# Patient Record
Sex: Male | Born: 1955 | Race: White | Hispanic: No | Marital: Single | Smoking: Current every day smoker
Health system: Southern US, Community
[De-identification: ages and names within clinical notes are randomized; demographics above are authoritative.]

## PROBLEM LIST (undated history)

## (undated) DIAGNOSIS — M40204 Unspecified kyphosis, thoracic region: Secondary | ICD-10-CM

## (undated) DIAGNOSIS — IMO0002 Reserved for concepts with insufficient information to code with codable children: Secondary | ICD-10-CM

## (undated) DIAGNOSIS — F419 Anxiety disorder, unspecified: Secondary | ICD-10-CM

## (undated) DIAGNOSIS — R7302 Impaired glucose tolerance (oral): Secondary | ICD-10-CM

## (undated) DIAGNOSIS — Z720 Tobacco use: Secondary | ICD-10-CM

## (undated) DIAGNOSIS — M549 Dorsalgia, unspecified: Secondary | ICD-10-CM

## (undated) DIAGNOSIS — E785 Hyperlipidemia, unspecified: Secondary | ICD-10-CM

## (undated) DIAGNOSIS — I739 Peripheral vascular disease, unspecified: Secondary | ICD-10-CM

## (undated) DIAGNOSIS — I10 Essential (primary) hypertension: Secondary | ICD-10-CM

## (undated) DIAGNOSIS — C4402 Squamous cell carcinoma of skin of lip: Secondary | ICD-10-CM

## (undated) DIAGNOSIS — F1011 Alcohol abuse, in remission: Secondary | ICD-10-CM

## (undated) DIAGNOSIS — K219 Gastro-esophageal reflux disease without esophagitis: Secondary | ICD-10-CM

## (undated) DIAGNOSIS — M79606 Pain in leg, unspecified: Secondary | ICD-10-CM

## (undated) HISTORY — DX: Anxiety disorder, unspecified: F41.9

## (undated) HISTORY — DX: Unspecified kyphosis, thoracic region: M40.204

## (undated) HISTORY — PX: CAROTID ENDARTERECTOMY: SUR193

## (undated) HISTORY — DX: Essential (primary) hypertension: I10

## (undated) HISTORY — DX: Reserved for concepts with insufficient information to code with codable children: IMO0002

## (undated) HISTORY — DX: Tobacco use: Z72.0

## (undated) HISTORY — DX: Dorsalgia, unspecified: M54.9

## (undated) HISTORY — DX: Peripheral vascular disease, unspecified: I73.9

## (undated) HISTORY — DX: Pain in leg, unspecified: M79.606

## (undated) HISTORY — DX: Hyperlipidemia, unspecified: E78.5

## (undated) HISTORY — DX: Alcohol abuse, in remission: F10.11

## (undated) HISTORY — DX: Impaired glucose tolerance (oral): R73.02

## (undated) HISTORY — DX: Squamous cell carcinoma of skin of lip: C44.02

---

## 1983-08-09 HISTORY — PX: PENILE PROSTHESIS IMPLANT: SHX240

## 2001-02-14 ENCOUNTER — Encounter: Admission: RE | Admit: 2001-02-14 | Discharge: 2001-02-14 | Payer: Self-pay | Admitting: Internal Medicine

## 2001-02-15 ENCOUNTER — Encounter: Payer: Self-pay | Admitting: Internal Medicine

## 2001-02-15 ENCOUNTER — Ambulatory Visit (HOSPITAL_COMMUNITY): Admission: RE | Admit: 2001-02-15 | Discharge: 2001-02-15 | Payer: Self-pay | Admitting: Internal Medicine

## 2001-02-19 ENCOUNTER — Encounter: Admission: RE | Admit: 2001-02-19 | Discharge: 2001-02-19 | Payer: Self-pay | Admitting: Internal Medicine

## 2001-03-21 ENCOUNTER — Encounter: Admission: RE | Admit: 2001-03-21 | Discharge: 2001-03-21 | Payer: Self-pay | Admitting: Internal Medicine

## 2001-03-28 ENCOUNTER — Encounter: Admission: RE | Admit: 2001-03-28 | Discharge: 2001-03-28 | Payer: Self-pay | Admitting: Neurosurgery

## 2001-03-28 ENCOUNTER — Encounter: Payer: Self-pay | Admitting: Neurosurgery

## 2001-04-11 ENCOUNTER — Encounter: Admission: RE | Admit: 2001-04-11 | Discharge: 2001-04-11 | Payer: Self-pay | Admitting: Neurosurgery

## 2001-04-11 ENCOUNTER — Encounter: Payer: Self-pay | Admitting: Neurosurgery

## 2001-04-26 ENCOUNTER — Encounter: Payer: Self-pay | Admitting: Neurosurgery

## 2001-04-26 ENCOUNTER — Encounter: Admission: RE | Admit: 2001-04-26 | Discharge: 2001-04-26 | Payer: Self-pay | Admitting: Neurosurgery

## 2001-05-28 ENCOUNTER — Encounter: Admission: RE | Admit: 2001-05-28 | Discharge: 2001-08-26 | Payer: Self-pay | Admitting: Neurosurgery

## 2001-06-21 ENCOUNTER — Ambulatory Visit (HOSPITAL_COMMUNITY): Admission: RE | Admit: 2001-06-21 | Discharge: 2001-06-21 | Payer: Self-pay | Admitting: Internal Medicine

## 2001-06-21 ENCOUNTER — Encounter: Admission: RE | Admit: 2001-06-21 | Discharge: 2001-06-21 | Payer: Self-pay | Admitting: Internal Medicine

## 2001-06-28 ENCOUNTER — Encounter: Admission: RE | Admit: 2001-06-28 | Discharge: 2001-06-28 | Payer: Self-pay | Admitting: Internal Medicine

## 2001-07-30 ENCOUNTER — Ambulatory Visit (HOSPITAL_COMMUNITY): Admission: RE | Admit: 2001-07-30 | Discharge: 2001-07-30 | Payer: Self-pay | Admitting: Neurosurgery

## 2001-07-30 ENCOUNTER — Encounter: Payer: Self-pay | Admitting: Neurosurgery

## 2001-08-08 HISTORY — PX: PR VEIN BYPASS GRAFT,AORTO-FEM-POP: 35551

## 2001-08-28 ENCOUNTER — Ambulatory Visit (HOSPITAL_COMMUNITY): Admission: RE | Admit: 2001-08-28 | Discharge: 2001-08-28 | Payer: Self-pay | Admitting: Neurosurgery

## 2001-08-28 ENCOUNTER — Encounter: Payer: Self-pay | Admitting: Neurosurgery

## 2001-09-17 ENCOUNTER — Encounter: Payer: Self-pay | Admitting: Neurosurgery

## 2001-09-17 ENCOUNTER — Ambulatory Visit (HOSPITAL_COMMUNITY): Admission: RE | Admit: 2001-09-17 | Discharge: 2001-09-17 | Payer: Self-pay | Admitting: Neurosurgery

## 2001-10-01 ENCOUNTER — Encounter: Admission: RE | Admit: 2001-10-01 | Discharge: 2001-10-01 | Payer: Self-pay

## 2001-10-30 ENCOUNTER — Encounter: Admission: RE | Admit: 2001-10-30 | Discharge: 2001-10-30 | Payer: Self-pay | Admitting: Internal Medicine

## 2001-11-07 ENCOUNTER — Ambulatory Visit (HOSPITAL_COMMUNITY): Admission: RE | Admit: 2001-11-07 | Discharge: 2001-11-07 | Payer: Self-pay | Admitting: Internal Medicine

## 2001-11-16 ENCOUNTER — Encounter: Admission: RE | Admit: 2001-11-16 | Discharge: 2001-11-16 | Payer: Self-pay | Admitting: Internal Medicine

## 2001-11-21 ENCOUNTER — Encounter: Payer: Self-pay | Admitting: *Deleted

## 2001-11-26 ENCOUNTER — Ambulatory Visit (HOSPITAL_COMMUNITY): Admission: RE | Admit: 2001-11-26 | Discharge: 2001-11-26 | Payer: Self-pay | Admitting: *Deleted

## 2001-12-13 ENCOUNTER — Ambulatory Visit (HOSPITAL_COMMUNITY): Admission: RE | Admit: 2001-12-13 | Discharge: 2001-12-13 | Payer: Self-pay | Admitting: *Deleted

## 2001-12-13 ENCOUNTER — Encounter: Payer: Self-pay | Admitting: *Deleted

## 2002-01-02 ENCOUNTER — Encounter: Admission: RE | Admit: 2002-01-02 | Discharge: 2002-01-02 | Payer: Self-pay | Admitting: Internal Medicine

## 2002-01-14 ENCOUNTER — Encounter: Admission: RE | Admit: 2002-01-14 | Discharge: 2002-01-14 | Payer: Self-pay | Admitting: Internal Medicine

## 2002-01-23 ENCOUNTER — Inpatient Hospital Stay (HOSPITAL_COMMUNITY): Admission: RE | Admit: 2002-01-23 | Discharge: 2002-02-02 | Payer: Self-pay | Admitting: *Deleted

## 2002-01-23 ENCOUNTER — Encounter (INDEPENDENT_AMBULATORY_CARE_PROVIDER_SITE_OTHER): Payer: Self-pay | Admitting: *Deleted

## 2002-01-23 ENCOUNTER — Encounter: Payer: Self-pay | Admitting: *Deleted

## 2002-01-24 ENCOUNTER — Encounter: Payer: Self-pay | Admitting: *Deleted

## 2002-01-25 ENCOUNTER — Encounter: Payer: Self-pay | Admitting: *Deleted

## 2002-01-30 ENCOUNTER — Encounter: Payer: Self-pay | Admitting: *Deleted

## 2002-02-18 ENCOUNTER — Encounter: Admission: RE | Admit: 2002-02-18 | Discharge: 2002-02-18 | Payer: Self-pay | Admitting: *Deleted

## 2002-02-18 ENCOUNTER — Encounter: Payer: Self-pay | Admitting: *Deleted

## 2002-03-25 ENCOUNTER — Encounter: Admission: RE | Admit: 2002-03-25 | Discharge: 2002-03-25 | Payer: Self-pay | Admitting: Internal Medicine

## 2002-05-28 ENCOUNTER — Encounter: Admission: RE | Admit: 2002-05-28 | Discharge: 2002-05-28 | Payer: Self-pay | Admitting: Internal Medicine

## 2002-06-18 ENCOUNTER — Encounter: Admission: RE | Admit: 2002-06-18 | Discharge: 2002-06-18 | Payer: Self-pay | Admitting: Internal Medicine

## 2002-07-01 ENCOUNTER — Encounter: Payer: Self-pay | Admitting: *Deleted

## 2002-07-03 ENCOUNTER — Encounter: Payer: Self-pay | Admitting: *Deleted

## 2002-07-03 ENCOUNTER — Inpatient Hospital Stay (HOSPITAL_COMMUNITY): Admission: RE | Admit: 2002-07-03 | Discharge: 2002-07-10 | Payer: Self-pay | Admitting: *Deleted

## 2002-07-24 ENCOUNTER — Encounter: Payer: Self-pay | Admitting: *Deleted

## 2002-07-24 ENCOUNTER — Encounter: Admission: RE | Admit: 2002-07-24 | Discharge: 2002-07-24 | Payer: Self-pay | Admitting: *Deleted

## 2002-08-21 ENCOUNTER — Encounter: Admission: RE | Admit: 2002-08-21 | Discharge: 2002-08-21 | Payer: Self-pay | Admitting: Internal Medicine

## 2002-09-16 ENCOUNTER — Encounter: Admission: RE | Admit: 2002-09-16 | Discharge: 2002-09-16 | Payer: Self-pay | Admitting: Internal Medicine

## 2002-09-27 ENCOUNTER — Encounter: Admission: RE | Admit: 2002-09-27 | Discharge: 2002-09-27 | Payer: Self-pay | Admitting: Internal Medicine

## 2002-10-30 ENCOUNTER — Inpatient Hospital Stay (HOSPITAL_COMMUNITY): Admission: EM | Admit: 2002-10-30 | Discharge: 2002-11-05 | Payer: Self-pay | Admitting: Emergency Medicine

## 2002-10-30 ENCOUNTER — Encounter: Payer: Self-pay | Admitting: Emergency Medicine

## 2002-11-01 ENCOUNTER — Encounter: Payer: Self-pay | Admitting: Internal Medicine

## 2002-11-03 ENCOUNTER — Encounter: Payer: Self-pay | Admitting: Thoracic Surgery (Cardiothoracic Vascular Surgery)

## 2002-11-13 ENCOUNTER — Encounter: Admission: RE | Admit: 2002-11-13 | Discharge: 2002-11-13 | Payer: Self-pay | Admitting: Internal Medicine

## 2002-12-23 ENCOUNTER — Encounter: Admission: RE | Admit: 2002-12-23 | Discharge: 2002-12-23 | Payer: Self-pay | Admitting: Internal Medicine

## 2002-12-31 ENCOUNTER — Encounter: Admission: RE | Admit: 2002-12-31 | Discharge: 2002-12-31 | Payer: Self-pay | Admitting: Internal Medicine

## 2003-04-17 ENCOUNTER — Encounter: Admission: RE | Admit: 2003-04-17 | Discharge: 2003-04-17 | Payer: Self-pay | Admitting: Internal Medicine

## 2003-09-18 ENCOUNTER — Encounter: Admission: RE | Admit: 2003-09-18 | Discharge: 2003-09-18 | Payer: Self-pay | Admitting: Internal Medicine

## 2003-10-03 ENCOUNTER — Encounter: Admission: RE | Admit: 2003-10-03 | Discharge: 2003-10-03 | Payer: Self-pay | Admitting: Internal Medicine

## 2003-11-09 ENCOUNTER — Emergency Department (HOSPITAL_COMMUNITY): Admission: EM | Admit: 2003-11-09 | Discharge: 2003-11-10 | Payer: Self-pay | Admitting: Emergency Medicine

## 2004-04-10 ENCOUNTER — Emergency Department (HOSPITAL_COMMUNITY): Admission: EM | Admit: 2004-04-10 | Discharge: 2004-04-10 | Payer: Self-pay | Admitting: Emergency Medicine

## 2004-04-15 ENCOUNTER — Ambulatory Visit: Payer: Self-pay | Admitting: Internal Medicine

## 2004-05-13 ENCOUNTER — Ambulatory Visit: Payer: Self-pay | Admitting: Internal Medicine

## 2004-10-06 ENCOUNTER — Ambulatory Visit: Payer: Self-pay | Admitting: Internal Medicine

## 2005-01-05 ENCOUNTER — Ambulatory Visit: Payer: Self-pay | Admitting: Internal Medicine

## 2005-08-19 ENCOUNTER — Ambulatory Visit: Payer: Self-pay | Admitting: Internal Medicine

## 2006-05-09 ENCOUNTER — Ambulatory Visit: Payer: Self-pay | Admitting: Hospitalist

## 2006-05-10 ENCOUNTER — Encounter (INDEPENDENT_AMBULATORY_CARE_PROVIDER_SITE_OTHER): Payer: Self-pay | Admitting: Internal Medicine

## 2006-05-10 DIAGNOSIS — E785 Hyperlipidemia, unspecified: Secondary | ICD-10-CM | POA: Insufficient documentation

## 2006-05-10 DIAGNOSIS — I739 Peripheral vascular disease, unspecified: Secondary | ICD-10-CM

## 2006-05-10 DIAGNOSIS — Z8639 Personal history of other endocrine, nutritional and metabolic disease: Secondary | ICD-10-CM

## 2006-05-10 DIAGNOSIS — F1010 Alcohol abuse, uncomplicated: Secondary | ICD-10-CM | POA: Insufficient documentation

## 2006-05-10 DIAGNOSIS — F418 Other specified anxiety disorders: Secondary | ICD-10-CM

## 2006-05-10 DIAGNOSIS — I10 Essential (primary) hypertension: Secondary | ICD-10-CM

## 2006-05-10 DIAGNOSIS — M549 Dorsalgia, unspecified: Secondary | ICD-10-CM | POA: Insufficient documentation

## 2006-05-10 DIAGNOSIS — F17200 Nicotine dependence, unspecified, uncomplicated: Secondary | ICD-10-CM | POA: Insufficient documentation

## 2006-05-10 DIAGNOSIS — M40299 Other kyphosis, site unspecified: Secondary | ICD-10-CM | POA: Insufficient documentation

## 2006-05-10 DIAGNOSIS — Z862 Personal history of diseases of the blood and blood-forming organs and certain disorders involving the immune mechanism: Secondary | ICD-10-CM

## 2006-05-10 LAB — CONVERTED CEMR LAB
Glucose, Bld: 94 mg/dL
Hgb A1c MFr Bld: 5.2 %

## 2006-08-10 ENCOUNTER — Ambulatory Visit: Payer: Self-pay | Admitting: Hospitalist

## 2006-08-10 LAB — CONVERTED CEMR LAB
BUN: 13 mg/dL (ref 6–23)
CO2: 26 meq/L (ref 19–32)
Chloride: 100 meq/L (ref 96–112)
Creatinine, Ser: 0.88 mg/dL (ref 0.40–1.50)
Glucose, Bld: 85 mg/dL (ref 70–99)
Potassium: 4.5 meq/L (ref 3.5–5.3)
Sodium: 139 meq/L (ref 135–145)

## 2006-08-15 ENCOUNTER — Ambulatory Visit: Payer: Self-pay | Admitting: Internal Medicine

## 2006-08-24 ENCOUNTER — Telehealth: Payer: Self-pay | Admitting: *Deleted

## 2006-08-25 ENCOUNTER — Telehealth (INDEPENDENT_AMBULATORY_CARE_PROVIDER_SITE_OTHER): Payer: Self-pay | Admitting: Hospitalist

## 2006-09-01 ENCOUNTER — Telehealth (INDEPENDENT_AMBULATORY_CARE_PROVIDER_SITE_OTHER): Payer: Self-pay | Admitting: Internal Medicine

## 2006-10-20 ENCOUNTER — Telehealth (INDEPENDENT_AMBULATORY_CARE_PROVIDER_SITE_OTHER): Payer: Self-pay | Admitting: *Deleted

## 2006-11-07 ENCOUNTER — Telehealth (INDEPENDENT_AMBULATORY_CARE_PROVIDER_SITE_OTHER): Payer: Self-pay | Admitting: *Deleted

## 2006-12-15 ENCOUNTER — Telehealth (INDEPENDENT_AMBULATORY_CARE_PROVIDER_SITE_OTHER): Payer: Self-pay | Admitting: *Deleted

## 2007-01-08 ENCOUNTER — Ambulatory Visit: Payer: Self-pay | Admitting: Vascular Surgery

## 2007-02-23 ENCOUNTER — Telehealth: Payer: Self-pay | Admitting: *Deleted

## 2007-04-13 ENCOUNTER — Telehealth: Payer: Self-pay | Admitting: *Deleted

## 2007-04-23 ENCOUNTER — Telehealth: Payer: Self-pay | Admitting: *Deleted

## 2007-05-14 ENCOUNTER — Telehealth (INDEPENDENT_AMBULATORY_CARE_PROVIDER_SITE_OTHER): Payer: Self-pay | Admitting: *Deleted

## 2007-05-21 ENCOUNTER — Ambulatory Visit: Payer: Self-pay | Admitting: Infectious Diseases

## 2007-05-21 ENCOUNTER — Encounter (INDEPENDENT_AMBULATORY_CARE_PROVIDER_SITE_OTHER): Payer: Self-pay | Admitting: Internal Medicine

## 2007-05-21 LAB — CONVERTED CEMR LAB
BUN: 15 mg/dL (ref 6–23)
CO2: 25 meq/L (ref 19–32)
Calcium: 9.7 mg/dL (ref 8.4–10.5)
Chloride: 99 meq/L (ref 96–112)
Creatinine, Ser: 0.76 mg/dL (ref 0.40–1.50)
Glucose, Bld: 92 mg/dL (ref 70–99)
Potassium: 4.9 meq/L (ref 3.5–5.3)
Sodium: 137 meq/L (ref 135–145)

## 2007-06-13 ENCOUNTER — Telehealth: Payer: Self-pay | Admitting: *Deleted

## 2007-06-14 ENCOUNTER — Telehealth: Payer: Self-pay | Admitting: *Deleted

## 2007-06-19 ENCOUNTER — Telehealth (INDEPENDENT_AMBULATORY_CARE_PROVIDER_SITE_OTHER): Payer: Self-pay | Admitting: *Deleted

## 2007-06-25 ENCOUNTER — Encounter (INDEPENDENT_AMBULATORY_CARE_PROVIDER_SITE_OTHER): Payer: Self-pay | Admitting: Internal Medicine

## 2007-06-25 ENCOUNTER — Ambulatory Visit: Payer: Self-pay | Admitting: Internal Medicine

## 2007-06-25 LAB — CONVERTED CEMR LAB
ALT: 11 units/L (ref 0–53)
AST: 22 units/L (ref 0–37)
Albumin: 4.6 g/dL (ref 3.5–5.2)
Alkaline Phosphatase: 57 units/L (ref 39–117)
BUN: 11 mg/dL (ref 6–23)
CO2: 28 meq/L (ref 19–32)
Chloride: 99 meq/L (ref 96–112)
Creatinine, Ser: 0.85 mg/dL (ref 0.40–1.50)
Creatinine, Urine: 38.6 mg/dL
Glucose, Bld: 122 mg/dL — ABNORMAL HIGH (ref 70–99)
HCT: 39.3 % (ref 39.0–52.0)
Hemoglobin: 13 g/dL (ref 13.0–17.0)
MCHC: 33.1 g/dL (ref 30.0–36.0)
MCV: 86 fL (ref 78.0–100.0)
Microalb Creat Ratio: 8 mg/g (ref 0.0–30.0)
Microalb, Ur: 0.31 mg/dL (ref 0.00–1.89)
Potassium: 4.1 meq/L (ref 3.5–5.3)
RBC: 4.57 M/uL (ref 4.22–5.81)
RDW: 12.9 % (ref 11.5–15.5)
Sodium: 141 meq/L (ref 135–145)
TSH: 0.889 microintl units/mL (ref 0.350–5.50)
Total Protein: 7.4 g/dL (ref 6.0–8.3)

## 2007-07-23 ENCOUNTER — Ambulatory Visit: Payer: Self-pay | Admitting: Surgery

## 2007-07-27 ENCOUNTER — Telehealth (INDEPENDENT_AMBULATORY_CARE_PROVIDER_SITE_OTHER): Payer: Self-pay | Admitting: *Deleted

## 2007-08-16 ENCOUNTER — Encounter: Payer: Self-pay | Admitting: Infectious Disease

## 2007-08-16 ENCOUNTER — Telehealth: Payer: Self-pay | Admitting: *Deleted

## 2007-08-28 ENCOUNTER — Telehealth: Payer: Self-pay | Admitting: Internal Medicine

## 2007-08-29 ENCOUNTER — Encounter (INDEPENDENT_AMBULATORY_CARE_PROVIDER_SITE_OTHER): Payer: Self-pay | Admitting: Internal Medicine

## 2007-08-29 ENCOUNTER — Ambulatory Visit: Payer: Self-pay | Admitting: Internal Medicine

## 2007-08-29 LAB — CONVERTED CEMR LAB
BUN: 11 mg/dL (ref 6–23)
CO2: 26 meq/L (ref 19–32)
Calcium: 9.5 mg/dL (ref 8.4–10.5)
Chloride: 99 meq/L (ref 96–112)
Glucose, Bld: 130 mg/dL — ABNORMAL HIGH (ref 70–99)
Potassium: 4.3 meq/L (ref 3.5–5.3)

## 2007-09-07 ENCOUNTER — Encounter
Admission: RE | Admit: 2007-09-07 | Discharge: 2007-09-10 | Payer: Self-pay | Admitting: Physical Medicine & Rehabilitation

## 2007-09-07 ENCOUNTER — Ambulatory Visit: Payer: Self-pay | Admitting: Physical Medicine & Rehabilitation

## 2007-09-14 ENCOUNTER — Telehealth: Payer: Self-pay | Admitting: *Deleted

## 2007-10-15 ENCOUNTER — Telehealth: Payer: Self-pay | Admitting: Infectious Diseases

## 2007-10-31 ENCOUNTER — Telehealth (INDEPENDENT_AMBULATORY_CARE_PROVIDER_SITE_OTHER): Payer: Self-pay | Admitting: Internal Medicine

## 2007-11-02 ENCOUNTER — Telehealth: Payer: Self-pay | Admitting: *Deleted

## 2007-12-12 ENCOUNTER — Telehealth (INDEPENDENT_AMBULATORY_CARE_PROVIDER_SITE_OTHER): Payer: Self-pay | Admitting: Internal Medicine

## 2008-01-09 ENCOUNTER — Telehealth (INDEPENDENT_AMBULATORY_CARE_PROVIDER_SITE_OTHER): Payer: Self-pay | Admitting: Pharmacy Technician

## 2008-01-14 ENCOUNTER — Telehealth (INDEPENDENT_AMBULATORY_CARE_PROVIDER_SITE_OTHER): Payer: Self-pay | Admitting: Internal Medicine

## 2008-01-21 ENCOUNTER — Ambulatory Visit: Payer: Self-pay | Admitting: *Deleted

## 2008-01-25 ENCOUNTER — Encounter (INDEPENDENT_AMBULATORY_CARE_PROVIDER_SITE_OTHER): Payer: Self-pay | Admitting: Internal Medicine

## 2008-01-25 ENCOUNTER — Ambulatory Visit: Payer: Self-pay | Admitting: Internal Medicine

## 2008-01-25 LAB — CONVERTED CEMR LAB
ALT: 14 units/L (ref 0–53)
Albumin: 4.4 g/dL (ref 3.5–5.2)
Alkaline Phosphatase: 74 units/L (ref 39–117)
BUN: 10 mg/dL (ref 6–23)
CO2: 20 meq/L (ref 19–32)
Calcium: 9.5 mg/dL (ref 8.4–10.5)
Chloride: 99 meq/L (ref 96–112)
Creatinine, Ser: 0.84 mg/dL (ref 0.40–1.50)
Glucose, Bld: 88 mg/dL (ref 70–99)
Potassium: 5.1 meq/L (ref 3.5–5.3)
Sodium: 135 meq/L (ref 135–145)
Total Bilirubin: 0.3 mg/dL (ref 0.3–1.2)
Total Protein: 7.5 g/dL (ref 6.0–8.3)

## 2008-03-06 ENCOUNTER — Telehealth (INDEPENDENT_AMBULATORY_CARE_PROVIDER_SITE_OTHER): Payer: Self-pay | Admitting: Internal Medicine

## 2008-03-11 ENCOUNTER — Telehealth (INDEPENDENT_AMBULATORY_CARE_PROVIDER_SITE_OTHER): Payer: Self-pay | Admitting: Internal Medicine

## 2008-03-13 ENCOUNTER — Telehealth (INDEPENDENT_AMBULATORY_CARE_PROVIDER_SITE_OTHER): Payer: Self-pay | Admitting: Internal Medicine

## 2008-03-14 ENCOUNTER — Ambulatory Visit: Payer: Self-pay | Admitting: Internal Medicine

## 2008-03-14 ENCOUNTER — Encounter (INDEPENDENT_AMBULATORY_CARE_PROVIDER_SITE_OTHER): Payer: Self-pay | Admitting: Internal Medicine

## 2008-03-14 DIAGNOSIS — K029 Dental caries, unspecified: Secondary | ICD-10-CM

## 2008-03-14 LAB — CONVERTED CEMR LAB
CO2: 23 meq/L (ref 19–32)
Glucose, Bld: 102 mg/dL — ABNORMAL HIGH (ref 70–99)
Potassium: 4.8 meq/L (ref 3.5–5.3)
Sodium: 134 meq/L — ABNORMAL LOW (ref 135–145)
Total CHOL/HDL Ratio: 2.8
VLDL: 11 mg/dL (ref 0–40)

## 2008-04-10 ENCOUNTER — Telehealth (INDEPENDENT_AMBULATORY_CARE_PROVIDER_SITE_OTHER): Payer: Self-pay | Admitting: Internal Medicine

## 2008-05-09 ENCOUNTER — Telehealth: Payer: Self-pay | Admitting: *Deleted

## 2008-06-12 ENCOUNTER — Encounter (INDEPENDENT_AMBULATORY_CARE_PROVIDER_SITE_OTHER): Payer: Self-pay | Admitting: Internal Medicine

## 2008-06-12 ENCOUNTER — Ambulatory Visit: Payer: Self-pay | Admitting: Internal Medicine

## 2008-06-18 LAB — CONVERTED CEMR LAB
ALT: 18 units/L (ref 0–53)
AST: 27 units/L (ref 0–37)
Albumin: 4.7 g/dL (ref 3.5–5.2)
Alkaline Phosphatase: 89 units/L (ref 39–117)
Amphetamine Screen, Ur: NEGATIVE
BUN: 13 mg/dL (ref 6–23)
Barbiturate Quant, Ur: NEGATIVE
Benzodiazepines.: NEGATIVE
CO2: 26 meq/L (ref 19–32)
Calcium: 9.8 mg/dL (ref 8.4–10.5)
Chloride: 97 meq/L (ref 96–112)
Cocaine Metabolites: NEGATIVE
Creatinine, Ser: 0.89 mg/dL (ref 0.40–1.50)
Creatinine,U: 71.7 mg/dL
Glucose, Bld: 94 mg/dL (ref 70–99)
Marijuana Metabolite: NEGATIVE
Methadone: NEGATIVE
Opiates: NEGATIVE
Phencyclidine (PCP): NEGATIVE
Potassium: 4.7 meq/L (ref 3.5–5.3)
Propoxyphene: NEGATIVE
Sodium: 135 meq/L (ref 135–145)
Total Bilirubin: 0.5 mg/dL (ref 0.3–1.2)
Total Protein: 7.6 g/dL (ref 6.0–8.3)

## 2008-09-15 ENCOUNTER — Ambulatory Visit: Payer: Self-pay | Admitting: *Deleted

## 2008-10-06 ENCOUNTER — Telehealth (INDEPENDENT_AMBULATORY_CARE_PROVIDER_SITE_OTHER): Payer: Self-pay | Admitting: *Deleted

## 2008-10-06 DIAGNOSIS — C4402 Squamous cell carcinoma of skin of lip: Secondary | ICD-10-CM

## 2008-10-06 HISTORY — DX: Squamous cell carcinoma of skin of lip: C44.02

## 2008-10-15 ENCOUNTER — Telehealth (INDEPENDENT_AMBULATORY_CARE_PROVIDER_SITE_OTHER): Payer: Self-pay | Admitting: Internal Medicine

## 2008-11-06 ENCOUNTER — Telehealth (INDEPENDENT_AMBULATORY_CARE_PROVIDER_SITE_OTHER): Payer: Self-pay | Admitting: Internal Medicine

## 2008-12-05 ENCOUNTER — Telehealth (INDEPENDENT_AMBULATORY_CARE_PROVIDER_SITE_OTHER): Payer: Self-pay | Admitting: Internal Medicine

## 2008-12-31 ENCOUNTER — Encounter (INDEPENDENT_AMBULATORY_CARE_PROVIDER_SITE_OTHER): Payer: Self-pay | Admitting: Internal Medicine

## 2008-12-31 ENCOUNTER — Ambulatory Visit: Payer: Self-pay | Admitting: Internal Medicine

## 2008-12-31 LAB — CONVERTED CEMR LAB
ALT: 14 units/L (ref 0–53)
BUN: 10 mg/dL (ref 6–23)
Benzodiazepines.: NEGATIVE
CO2: 27 meq/L (ref 19–32)
Cocaine Metabolites: NEGATIVE
Creatinine, Ser: 0.87 mg/dL (ref 0.40–1.50)
Creatinine,U: 80.6 mg/dL
GFR calc Af Amer: >60 mL/min (ref >60)
GFR calc non Af Amer: >60 mL/min (ref >60)
Phencyclidine (PCP): NEGATIVE
Propoxyphene: NEGATIVE
Total Bilirubin: 0.3 mg/dL (ref 0.3–1.2)

## 2009-01-01 ENCOUNTER — Telehealth (INDEPENDENT_AMBULATORY_CARE_PROVIDER_SITE_OTHER): Payer: Self-pay | Admitting: Internal Medicine

## 2009-01-08 ENCOUNTER — Encounter (INDEPENDENT_AMBULATORY_CARE_PROVIDER_SITE_OTHER): Payer: Self-pay | Admitting: Internal Medicine

## 2009-01-22 ENCOUNTER — Telehealth (INDEPENDENT_AMBULATORY_CARE_PROVIDER_SITE_OTHER): Payer: Self-pay | Admitting: Internal Medicine

## 2009-01-23 DIAGNOSIS — C4400 Unspecified malignant neoplasm of skin of lip: Secondary | ICD-10-CM | POA: Insufficient documentation

## 2009-02-02 ENCOUNTER — Telehealth (INDEPENDENT_AMBULATORY_CARE_PROVIDER_SITE_OTHER): Payer: Self-pay | Admitting: Internal Medicine

## 2009-02-26 ENCOUNTER — Telehealth (INDEPENDENT_AMBULATORY_CARE_PROVIDER_SITE_OTHER): Payer: Self-pay | Admitting: Internal Medicine

## 2009-03-02 ENCOUNTER — Encounter (INDEPENDENT_AMBULATORY_CARE_PROVIDER_SITE_OTHER): Payer: Self-pay | Admitting: Internal Medicine

## 2009-03-03 ENCOUNTER — Telehealth (INDEPENDENT_AMBULATORY_CARE_PROVIDER_SITE_OTHER): Payer: Self-pay | Admitting: Internal Medicine

## 2009-03-25 ENCOUNTER — Telehealth (INDEPENDENT_AMBULATORY_CARE_PROVIDER_SITE_OTHER): Payer: Self-pay | Admitting: Internal Medicine

## 2009-04-01 ENCOUNTER — Telehealth (INDEPENDENT_AMBULATORY_CARE_PROVIDER_SITE_OTHER): Payer: Self-pay | Admitting: Internal Medicine

## 2009-04-30 ENCOUNTER — Telehealth: Payer: Self-pay | Admitting: Internal Medicine

## 2009-05-25 ENCOUNTER — Ambulatory Visit: Payer: Self-pay | Admitting: Internal Medicine

## 2009-05-25 ENCOUNTER — Encounter (INDEPENDENT_AMBULATORY_CARE_PROVIDER_SITE_OTHER): Payer: Self-pay | Admitting: Internal Medicine

## 2009-05-29 LAB — CONVERTED CEMR LAB
ALT: 23 units/L (ref 0–53)
AST: 26 units/L (ref 0–37)
Benzodiazepines.: POSITIVE — AB
CO2: 23 meq/L (ref 19–32)
Calcium: 9.7 mg/dL (ref 8.4–10.5)
Chloride: 102 meq/L (ref 96–112)
Cholesterol: 179 mg/dL (ref 0–200)
Cocaine Metabolites: NEGATIVE
Creatinine,U: 199.9 mg/dL
Marijuana Metabolite: NEGATIVE
Opiates: POSITIVE — AB
Phencyclidine (PCP): NEGATIVE
Potassium: 4.6 meq/L (ref 3.5–5.3)
Propoxyphene: NEGATIVE
Sodium: 139 meq/L (ref 135–145)
Total Protein: 7.4 g/dL (ref 6.0–8.3)

## 2009-06-23 ENCOUNTER — Telehealth (INDEPENDENT_AMBULATORY_CARE_PROVIDER_SITE_OTHER): Payer: Self-pay | Admitting: Internal Medicine

## 2009-06-26 ENCOUNTER — Telehealth (INDEPENDENT_AMBULATORY_CARE_PROVIDER_SITE_OTHER): Payer: Self-pay | Admitting: Internal Medicine

## 2009-07-09 ENCOUNTER — Telehealth (INDEPENDENT_AMBULATORY_CARE_PROVIDER_SITE_OTHER): Payer: Self-pay | Admitting: Internal Medicine

## 2009-07-16 ENCOUNTER — Ambulatory Visit: Payer: Self-pay | Admitting: Vascular Surgery

## 2009-07-24 ENCOUNTER — Telehealth (INDEPENDENT_AMBULATORY_CARE_PROVIDER_SITE_OTHER): Payer: Self-pay | Admitting: *Deleted

## 2009-08-24 ENCOUNTER — Telehealth: Payer: Self-pay | Admitting: Internal Medicine

## 2009-09-22 ENCOUNTER — Telehealth (INDEPENDENT_AMBULATORY_CARE_PROVIDER_SITE_OTHER): Payer: Self-pay | Admitting: Internal Medicine

## 2009-10-19 ENCOUNTER — Telehealth (INDEPENDENT_AMBULATORY_CARE_PROVIDER_SITE_OTHER): Payer: Self-pay | Admitting: Internal Medicine

## 2009-10-26 ENCOUNTER — Ambulatory Visit: Payer: Self-pay | Admitting: Internal Medicine

## 2009-10-26 LAB — CONVERTED CEMR LAB
Benzodiazepines.: POSITIVE — AB
Cocaine Metabolites: NEGATIVE
Creatinine,U: 155.6 mg/dL
Marijuana Metabolite: NEGATIVE
Opiates: POSITIVE — AB
Phencyclidine (PCP): NEGATIVE
Propoxyphene: NEGATIVE

## 2009-11-11 ENCOUNTER — Telehealth (INDEPENDENT_AMBULATORY_CARE_PROVIDER_SITE_OTHER): Payer: Self-pay | Admitting: Internal Medicine

## 2009-12-10 ENCOUNTER — Telehealth (INDEPENDENT_AMBULATORY_CARE_PROVIDER_SITE_OTHER): Payer: Self-pay | Admitting: Internal Medicine

## 2009-12-16 ENCOUNTER — Telehealth (INDEPENDENT_AMBULATORY_CARE_PROVIDER_SITE_OTHER): Payer: Self-pay | Admitting: Internal Medicine

## 2010-01-07 ENCOUNTER — Telehealth (INDEPENDENT_AMBULATORY_CARE_PROVIDER_SITE_OTHER): Payer: Self-pay | Admitting: Internal Medicine

## 2010-01-13 ENCOUNTER — Telehealth (INDEPENDENT_AMBULATORY_CARE_PROVIDER_SITE_OTHER): Payer: Self-pay | Admitting: Internal Medicine

## 2010-02-03 ENCOUNTER — Encounter: Payer: Self-pay | Admitting: Internal Medicine

## 2010-02-03 ENCOUNTER — Inpatient Hospital Stay (HOSPITAL_COMMUNITY): Admission: EM | Admit: 2010-02-03 | Discharge: 2010-02-04 | Payer: Self-pay | Admitting: Emergency Medicine

## 2010-02-03 ENCOUNTER — Ambulatory Visit: Payer: Self-pay | Admitting: Cardiology

## 2010-02-03 ENCOUNTER — Ambulatory Visit: Payer: Self-pay | Admitting: Internal Medicine

## 2010-02-04 ENCOUNTER — Encounter: Payer: Self-pay | Admitting: Internal Medicine

## 2010-02-15 ENCOUNTER — Telehealth: Payer: Self-pay | Admitting: *Deleted

## 2010-02-15 ENCOUNTER — Telehealth (INDEPENDENT_AMBULATORY_CARE_PROVIDER_SITE_OTHER): Payer: Self-pay | Admitting: *Deleted

## 2010-03-01 ENCOUNTER — Telehealth: Payer: Self-pay | Admitting: Internal Medicine

## 2010-03-04 ENCOUNTER — Ambulatory Visit: Payer: Self-pay | Admitting: Internal Medicine

## 2010-03-04 DIAGNOSIS — R079 Chest pain, unspecified: Secondary | ICD-10-CM | POA: Insufficient documentation

## 2010-03-17 ENCOUNTER — Telehealth (INDEPENDENT_AMBULATORY_CARE_PROVIDER_SITE_OTHER): Payer: Self-pay | Admitting: *Deleted

## 2010-03-17 ENCOUNTER — Telehealth: Payer: Self-pay | Admitting: Internal Medicine

## 2010-04-02 ENCOUNTER — Telehealth: Payer: Self-pay | Admitting: Internal Medicine

## 2010-04-15 ENCOUNTER — Telehealth (INDEPENDENT_AMBULATORY_CARE_PROVIDER_SITE_OTHER): Payer: Self-pay | Admitting: *Deleted

## 2010-05-10 ENCOUNTER — Ambulatory Visit: Payer: Self-pay | Admitting: Internal Medicine

## 2010-05-10 ENCOUNTER — Encounter: Payer: Self-pay | Admitting: Ophthalmology

## 2010-05-11 LAB — CONVERTED CEMR LAB
AST: 21 units/L (ref 0–37)
Albumin: 4.7 g/dL (ref 3.5–5.2)
Alkaline Phosphatase: 79 units/L (ref 39–117)
Amphetamine Screen, Ur: NEGATIVE
BUN: 11 mg/dL (ref 6–23)
Benzodiazepines.: NEGATIVE
Calcium: 10 mg/dL (ref 8.4–10.5)
Chloride: 100 meq/L (ref 96–112)
Cocaine Metabolites: NEGATIVE
LDL Cholesterol: 76 mg/dL (ref 0–99)
Marijuana Metabolite: NEGATIVE
Methadone: NEGATIVE
Phencyclidine (PCP): NEGATIVE
Potassium: 4.7 meq/L (ref 3.5–5.3)
Sodium: 138 meq/L (ref 135–145)
Total Protein: 7.2 g/dL (ref 6.0–8.3)

## 2010-05-14 ENCOUNTER — Telehealth (INDEPENDENT_AMBULATORY_CARE_PROVIDER_SITE_OTHER): Payer: Self-pay | Admitting: *Deleted

## 2010-06-10 ENCOUNTER — Telehealth (INDEPENDENT_AMBULATORY_CARE_PROVIDER_SITE_OTHER): Payer: Self-pay | Admitting: *Deleted

## 2010-06-10 ENCOUNTER — Ambulatory Visit: Payer: Self-pay | Admitting: Internal Medicine

## 2010-06-10 DIAGNOSIS — T82218A Other mechanical complication of coronary artery bypass graft, initial encounter: Secondary | ICD-10-CM | POA: Insufficient documentation

## 2010-06-14 ENCOUNTER — Telehealth: Payer: Self-pay | Admitting: Ophthalmology

## 2010-06-23 ENCOUNTER — Encounter (INDEPENDENT_AMBULATORY_CARE_PROVIDER_SITE_OTHER): Payer: Self-pay | Admitting: *Deleted

## 2010-07-14 ENCOUNTER — Telehealth: Payer: Self-pay | Admitting: *Deleted

## 2010-07-15 ENCOUNTER — Telehealth: Payer: Self-pay | Admitting: Internal Medicine

## 2010-08-04 ENCOUNTER — Telehealth (INDEPENDENT_AMBULATORY_CARE_PROVIDER_SITE_OTHER): Payer: Self-pay | Admitting: *Deleted

## 2010-08-12 ENCOUNTER — Telehealth: Payer: Self-pay | Admitting: Internal Medicine

## 2010-09-07 NOTE — Progress Notes (Signed)
Summary: refill/gg  Phone Note Refill Request  on Dec 16, 2009 9:25 AM  Refills Requested: Medication #1:  HYDROCODONE-ACETAMINOPHEN 7.5-500 MG TABS Take 1 tablet by mouth every 6 hours as needed for pain.   Last Refilled: 11/11/2009 call when ready @ [phone]   Method Requested: Telephone to Pharmacy Initial call taken by: Merrie Roof RN,  Dec 16, 2009 9:25 AM  Follow-up for Phone Call       Follow-up by: Jason Coop MD,  Dec 16, 2009 9:29 AM    Prescriptions: HYDROCODONE-ACETAMINOPHEN 7.5-500 MG TABS (HYDROCODONE-ACETAMINOPHEN) Take 1 tablet by mouth every 6 hours as needed for pain.  #150 x 0   Entered and Authorized by:   Jason Coop MD   Signed by:   Jason Coop MD on 12/16/2009   Method used:   Telephoned to ...       [facility]  [phone]* (retail)       [address]       [place], [place]  [address]       Ph: [phone] or [phone]       Fax: [phone]   RxID:   [id number]   Appended Document: refill/gg Rx called in

## 2010-09-07 NOTE — Assessment & Plan Note (Signed)
Summary: [facility]-(bowen) per dr devani/[facility]   Vital Signs:  Patient profile:   55 year old male Height:      71.5 inches Weight:      161.5 pounds BMI:     23.19 Temp:     98.1 degrees F oral Pulse rate:   85 / minute BP sitting:   123 / 79  (right arm)  Vitals Entered By: Filomena Jungling NT II (March 04, 2010 9:09 AM) CC: HFU Is Patient Diabetic? No Pain Assessment Patient in pain? yes     Location: back Intensity: 7 Type: aching Onset of pain  Chronic Nutritional Status BMI of 19 -24 = normal  Have you ever been in a relationship where you felt threatened, hurt or afraid?No   Does patient need assistance? Functional Status Self care Ambulation Normal   Primary Care Provider:  Sinda Du MD  CC:  HFU.  History of Present Illness: Patient is a 55 year old man with PMH as described in EMR is here today for a follow up appointmnet of his hospital admission 3 week ago for chest pain.  1. Chest pain: He says that the chets pain is "basically gone". He is feeling a lot better now.  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  Patient says that he will schedule an appointment with Dr Ezzard Standing in the middle ofAugust. The patient has been diagnosed with this cancer in the past, and he had a surgical excision, but the margins were not clear. I offered him to put an appointmnet for him but he said that he will do it himself.  3. Alcoholism: Quit since he was discharged from the hospital. and he has been going to [employer] here in [place]. Denies any help at this time.  4. Hyperlipidemia.  Not fasting today. I have asked him to come next week.   5. Smoking: Still smoking about a pack a day and not interested in quitting. Says he would love to get Chantix but it is very expensive, wants to start  Wellbutrin.  Preventive Screening-Counseling & Management  Alcohol-Tobacco     Alcohol drinks/day: 0     Alcohol type: spirits     Alcohol Counseling: to STOP drinking  Smoking Status: current     Smoking Cessation Counseling: yes     Packs/Day: <1     Year Started: STARTED BACK SMOKING RECENTLY     Year Quit: 2 weeks ago     Tobacco Counseling: to quit use of tobacco products  Caffeine-Diet-Exercise     Does Patient Exercise: no  Problems Prior to Update: 1)  Other Malignant Neoplasm of Skin of Lip  (ICD-173.0) 2)  Other Dental Caries  (ICD-521.09) 3)  Preventive Health Care  (ICD-V70.0) 4)  Glucose Intolerance, Hx of  (ICD-V12.2) 5)  Peripheral Vascular Disease  (ICD-443.9) 6)  Hypertension, Essential Nos  (ICD-401.9) 7)  Dyslipidemia  (ICD-272.4) 8)  Anxiety  (ICD-300.00) 9)  Thoracic Kyphosis  (ICD-737.19) 10)  Alcohol Abuse  (ICD-305.00) 11)  Back Pain  (ICD-724.5) 12)  Tobacco Abuse  (ICD-305.1)  Medications Prior to Update: 1)  Zocor 40 Mg Tabs (Simvastatin) .... Take 1 Pill By Mouth Daily. 2)  Hydrocodone-Acetaminophen 7.5-500 Mg Tabs (Hydrocodone-Acetaminophen) .... Take 1-2  Tables By Mouth Every 6 Hours As Needed For Pain. 3)  Enalapril Maleate 20 Mg Tabs (Enalapril Maleate) .... Take 1 Tablet By Mouth Once A Day 4)  Hydrochlorothiazide 25 Mg Tabs (Hydrochlorothiazide) .... Take 1 Tablet By Mouth Once  A Day 5)  Aspirin 81 Mg Chew (Aspirin) .... Take 1 Tablet By Mouth Once A Day 6)  Omeprazole 20 Mg Cpdr (Omeprazole) .... Take 1 Tablet By Mouth Once A Day 7)  Metoprolol Tartrate 50 Mg Tabs (Metoprolol Tartrate) .... Take 1 Tablet By Mouth Two Times A Day 8)  Clonazepam 1 Mg  Tabs (Clonazepam) .... Take 1 Pill By Mouth Three Times A Day 9)  Zoloft 50 Mg Tabs (Sertraline Hcl) .... Take 1 Pill By Mouth Daily in Morning. 10)  Folic Acid 1 Mg Tabs (Folic Acid) .... Take 1 Tablet By Mouth Once A Day 11)  Thiamine Hcl 100 Mg Tabs (Thiamine Hcl) .... Take 1 Tablet By Mouth Once A Day  Current Medications (verified): 1)  Zocor 40 Mg Tabs (Simvastatin) .... Take 1 Pill By Mouth Daily. 2)  Hydrocodone-Acetaminophen 7.5-500 Mg Tabs  (Hydrocodone-Acetaminophen) .... Take 1-2  Tables By Mouth Every 6 Hours As Needed For Pain. 3)  Enalapril Maleate 20 Mg Tabs (Enalapril Maleate) .... Take 1 Tablet By Mouth Once A Day 4)  Hydrochlorothiazide 25 Mg Tabs (Hydrochlorothiazide) .... Take 1 Tablet By Mouth Once A Day 5)  Aspirin 81 Mg Chew (Aspirin) .... Take 1 Tablet By Mouth Once A Day 6)  Omeprazole 20 Mg Cpdr (Omeprazole) .... Take 1 Tablet By Mouth Once A Day 7)  Metoprolol Tartrate 50 Mg Tabs (Metoprolol Tartrate) .... Take 1 Tablet By Mouth Two Times A Day 8)  Clonazepam 1 Mg  Tabs (Clonazepam) .... Take 1 Pill By Mouth Three Times A Day 9)  Zoloft 50 Mg Tabs (Sertraline Hcl) .... Take 1 Pill By Mouth Daily in Morning. 10)  Folic Acid 1 Mg Tabs (Folic Acid) .... Take 1 Tablet By Mouth Once A Day 11)  Thiamine Hcl 100 Mg Tabs (Thiamine Hcl) .... Take 1 Tablet By Mouth Once A Day 12)  Bupropion Hcl 150 Mg Xr12h-Tab (Bupropion Hcl) .... Take 1 Tablet By Mouth Once A Day  Allergies (verified): No Known Drug Allergies  Past History:  Past Medical History: Last updated: 05/10/2006 Anxiety Hyperlipidemia Hypertension Peripheral vascular disease Glucose intolerance Tobacco abuse, hx of Thoracic kyphosis Back pain Alcohol abuse, hx of  Risk Factors: Alcohol Use: 0 (03/04/2010) Exercise: no (03/04/2010)  Risk Factors: Smoking Status: current (03/04/2010) Packs/Day: <1 (03/04/2010)  Review of Systems      See HPI  Physical Exam  Additional Exam:  Gen: AOx3, in no acute distress Eyes: PERRL, EOMI ENT:MMM, No erythema noted in posterior pharynx Neck: No JVD, No LAP Chest: CTAB with  good respiratory effort CVS: regular rhythmic rate, NO M/R/G, S1 S2 normal Abdo: soft,ND, BS+x4, Non tender and No hepatosplenomegaly EXT: No odema noted Neuro: Non focal, gait is normal Skin: no rashes noted.    Impression & Recommendations:  Problem # 1:  OTHER MALIGNANT NEOPLASM OF SKIN OF LIP (ICD-173.0) Assessment  Unchanged Patient will schedule an appointment by himself. Will follow up in clinic after the appointment. Will follow up in next visit about the appointment.  Problem # 2:  HYPERTENSION, ESSENTIAL NOS (ICD-401.9) Assessment: Improved At goal today. His updated medication list for this problem includes:    Enalapril Maleate 20 Mg Tabs (Enalapril maleate) .Marland Kitchen... Take 1 tablet by mouth once a day    Hydrochlorothiazide 25 Mg Tabs (Hydrochlorothiazide) .Marland Kitchen... Take 1 tablet by mouth once a day    Metoprolol Tartrate 50 Mg Tabs (Metoprolol tartrate) .Marland Kitchen... Take 1 tablet by mouth two times a day  BP today: 123/79  Prior BP: 136/85 (10/26/2009)  Labs Reviewed: K+: 4.6 (05/25/2009) Creat: : 0.85 (05/25/2009)   Chol: 179 (05/25/2009)   HDL: 59 (05/25/2009)   LDL: 105 (05/25/2009)   TG: 75 (05/25/2009)  Problem # 3:  DYSLIPIDEMIA (ICD-272.4) Assessment: Comment Only Callhim next week for the blood test. His updated medication list for this problem includes:    Zocor 40 Mg Tabs (Simvastatin) .Marland Kitchen... Take 1 pill by mouth daily.  Future Orders: T-Lipid Profile [id number]) ... 03/08/2010  Labs Reviewed: SGOT: 26 (05/25/2009)   SGPT: 23 (05/25/2009)   HDL:59 (05/25/2009), 56 (03/14/2008)  LDL:105 (05/25/2009), 91 (14/78/2956)  Chol:179 (05/25/2009), 158 (03/14/2008)  Trig:75 (05/25/2009), 56 (03/14/2008)  Problem # 4:  ALCOHOL ABUSE (ICD-305.00) Assessment: Improved Quit at this time. Couselled about the benefits.  Problem # 5:  TOBACCO ABUSE (ICD-305.1) Assessment: Unchanged Wants to quit.  will start him on wellbutrin at this time and will follow up in 2 weeks as needed.  Problem # 6:  CHEST PAIN (ICD-786.50) Assessment: Improved Patient was admitted with chest pain but workup was negative for any acute ischemic event. With no symptoms at this time and 2dEcho alsocompletely normal. I will not do any further tests. Will follow up and try to focus on cardiovascular risk factrs which can be  improved up on.  Complete Medication List: 1)  Zocor 40 Mg Tabs (Simvastatin) .... Take 1 pill by mouth daily. 2)  Hydrocodone-acetaminophen 7.5-500 Mg Tabs (Hydrocodone-acetaminophen) .... Take 1-2  tables by mouth every 6 hours as needed for pain. 3)  Enalapril Maleate 20 Mg Tabs (Enalapril maleate) .... Take 1 tablet by mouth once a day 4)  Hydrochlorothiazide 25 Mg Tabs (Hydrochlorothiazide) .... Take 1 tablet by mouth once a day 5)  Aspirin 81 Mg Chew (Aspirin) .... Take 1 tablet by mouth once a day 6)  Omeprazole 20 Mg Cpdr (Omeprazole) .... Take 1 tablet by mouth once a day 7)  Metoprolol Tartrate 50 Mg Tabs (Metoprolol tartrate) .... Take 1 tablet by mouth two times a day 8)  Clonazepam 1 Mg Tabs (Clonazepam) .... Take 1 pill by mouth three times a day 9)  Zoloft 50 Mg Tabs (Sertraline hcl) .... Take 1 pill by mouth daily in morning. 10)  Folic Acid 1 Mg Tabs (Folic acid) .... Take 1 tablet by mouth once a day 11)  Thiamine Hcl 100 Mg Tabs (Thiamine hcl) .... Take 1 tablet by mouth once a day 12)  Bupropion Hcl 150 Mg Xr12h-tab (Bupropion hcl) .... Take 1 tablet by mouth once a day  Patient Instructions: 1)  Please schedule a follow-up appointment as needed. 2)  Tobacco is very bad for your health and your loved ones! You Should stop smoking!. 3)  Stop Smoking Tips: Choose a Quit date. Cut down before the Quit date. decide what you will do as a substitute when you feel the urge to smoke(gum,toothpick,exercise). 4)  Take an Aspirin every day. 5)  Check your Blood Pressure regularly. If it is above: 160/100 you should make an appointment. Prescriptions: BUPROPION HCL 150 MG XR12H-TAB (BUPROPION HCL) Take 1 tablet by mouth once a day  #31 x 1   Entered and Authorized by:   Lars Mage MD   Signed by:   Lars Mage MD on 03/04/2010   Method used:   Print then Give to Patient   RxID:   [id number]   Prevention & Chronic Care Immunizations   Influenza vaccine: Not documented    Influenza vaccine deferral: Deferred  (10/26/2009)  Tetanus booster: Not documented   Td booster deferral: Deferred  (10/26/2009)    Pneumococcal vaccine: Not documented  Colorectal Screening   Hemoccult: Not documented   Hemoccult action/deferral: Ordered  (10/26/2009)    Colonoscopy: Not documented   Colonoscopy action/deferral: Refused  (10/26/2009)  Other Screening   PSA: Not documented   PSA action/deferral: Discussion deferred  (10/26/2009)   Smoking status: current  (03/04/2010)   Smoking cessation counseling: yes  (03/04/2010)  Lipids   Total Cholesterol: 179  (05/25/2009)   Lipid panel action/deferral: Lipid Panel ordered   LDL: 105  (05/25/2009)   LDL Direct: Not documented   HDL: 59  (05/25/2009)   Triglycerides: 75  (05/25/2009)    SGOT (AST): 26  (05/25/2009)   BMP action: Ordered   SGPT (ALT): 23  (05/25/2009)   Alkaline phosphatase: 82  (05/25/2009)   Total bilirubin: 0.4  (05/25/2009)  Hypertension   Last Blood Pressure: 123 / 79  (03/04/2010)   Serum creatinine: 0.85  (05/25/2009)   BMP action: Ordered   Serum potassium 4.6  (05/25/2009)  Self-Management Support :   Personal Goals (by the next clinic visit) :      Personal blood pressure goal: 140/90  (05/25/2009)     Personal LDL goal: 130  (05/25/2009)    Patient will work on the following items until the next clinic visit to reach self-care goals:     Medications and monitoring: take my medicines every day, bring all of my medications to every visit  (03/04/2010)     Eating: drink diet soda or water instead of juice or soda, eat more vegetables, use fresh or frozen vegetables, eat foods that are low in salt, eat baked foods instead of fried foods, eat fruit for snacks and desserts  (03/04/2010)     Activity: take a 30 minute walk every day  (03/04/2010)    Hypertension self-management support: Education handout  (03/04/2010)   Hypertension education handout printed    Lipid self-management  support: Education handout  (03/04/2010)     Lipid education handout printed   Process Orders Tests Sent for requisitioning (March 04, 2010 11:20 AM):     03/08/2010: [facility] -- T-Lipid Profile [id number] (signed)     Process Orders Tests Sent for requisitioning (March 04, 2010 11:20 AM):     03/08/2010: [facility] -- T-Lipid Profile [id number] (signed)

## 2010-09-07 NOTE — Assessment & Plan Note (Signed)
Summary: f/u visit per Dr Klima/gg   Vital Signs:  Patient profile:   55 year old male Height:      71.5 inches (181.61 cm) Weight:      168 pounds (76.36 kg) BMI:     23.19 Temp:     98.7 degrees F (37.06 degrees C) oral Pulse rate:   95 / minute BP sitting:   136 / 85  (right arm)  Vitals Entered By: Angelina Ok RN (October 26, 2009 2:51 PM) CC: Depression Is Patient Diabetic? No Pain Assessment Patient in pain? yes     Location: back Intensity: 6 Type: aching Onset of pain  Constant  Have you ever been in a relationship where you felt threatened, hurt or afraid?No   Does patient need assistance? Functional Status Self care Ambulation Normal Comments More pain in back.  Pain med takes 2 at a time.  Wants 1 more per day.  Check up.   CC:  Depression.  History of Present Illness: Mr. Bias is a 55 yo man with PMH as outlined in the EMr COMES today for a f/u visit.   1. Lip lesion: Pt says he was supposed to get a significant portion of his lower lip removed and he needs to be on denture, but he says he is not ready about that and is little scared about it. Pt says the lower lip doesnot look any significantly changed since 02/2009.   2. PVD: Pt saw Dr. Madilyn Fireman partner as Dr. Madilyn Fireman moved to [place]. He also had an ABI and it is stable and plan is to continue the conservative treatment.   3. HTN: He is taking HCTZ and enalapril without any problem.   4. HL: He is taking Zocor 20 mg by mouth daily than 40 mg daily.   5. Anxiety: He is taking clonapin and sertraline. He lost his job for the past few months and is looking for a new job.   6. Back pain: His pain is stable with vicodin, but says he may need 1 extra vicodin pill per day than whatever he is taking.   7. Tobacco and alcohal abuse: He smokes half a pack per day and he doesnot use chantix. One of his neighbours had an MI recently who used to smoke and this motivated him to quit smoking. He drinks about 6 packs of  beer per month and he drinks only on weekends.   Depression History:      The patient is having a depressed mood most of the day but denies diminished interest in his usual daily activities.        The patient denies that he feels like life is not worth living, denies that he wishes that he were dead, and denies that he has thought about ending his life.        Comments:  unemployed.   Preventive Screening-Counseling & Management  Alcohol-Tobacco     Alcohol type: BEER ; OCCASIONAL     Smoking Status: current     Smoking Cessation Counseling: yes     Packs/Day: <1     Year Started: STARTED BACK SMOKING RECENTLY     Year Quit: 2 weeks ago  Current Medications (verified): 1)  Zocor 40 Mg Tabs (Simvastatin) .... Take 1 Pill By Mouth Daily. 2)  Hydrocodone-Acetaminophen 7.5-500 Mg Tabs (Hydrocodone-Acetaminophen) .... Take 1 Tablet By Mouth Every 6 Hours As Needed For Pain. Refills Given Till 10/7. Don't Refill Till Then. 3)  Enalapril Maleate 20 Mg  Tabs (Enalapril Maleate) .... Take 1 Tablet By Mouth Once A Day 4)  Hydrochlorothiazide 25 Mg Tabs (Hydrochlorothiazide) .... Take 1 Tablet By Mouth Once A Day 5)  Aspirin 81 Mg Chew (Aspirin) .... Take 1 Tablet By Mouth Once A Day 6)  Omeprazole 20 Mg Cpdr (Omeprazole) .... Take 1 Tablet By Mouth Once A Day 7)  Metoprolol Tartrate 50 Mg Tabs (Metoprolol Tartrate) .... Take 1 Tablet By Mouth Two Times A Day 8)  Clonazepam 1 Mg  Tabs (Clonazepam) .... Take 1 Pill By Mouth Three Times A Day 9)  Zoloft 50 Mg Tabs (Sertraline Hcl) .... Take 1 Pill By Mouth Daily in Morning.  Allergies: No Known Drug Allergies  Review of Systems      See HPI  Physical Exam  Mouth:  pharynx pink and moist.  No obvious lesions noted on the lower lip.  Lungs:  normal breath sounds, no crackles, and no wheezes.   Heart:  normal rate, regular rhythm, no murmur, no gallop, and no rub.   Abdomen:  soft, non-tender, normal bowel sounds, and no distention.     Extremities:  trace left pedal edema and trace right pedal edema.   Neurologic:  alert & oriented X3.   Cervical Nodes:  no anterior cervical adenopathy and no posterior cervical adenopathy.     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 I spoke with Dr. Ezzard Standing in front of the pt and Dr. Ezzard Standing said that Mr. Gallien already had his first surgery of his lower lip and there was a small lesion on the lip after surgery which could be malignant. Option is to either f/u closely or since it can be malignant, remove it surgically before it progresses. Since it has been 8 months since his first surgery and his lesion in lower lip has not seem to be progressed plan is to have him f/u with Dr. Ezzard Standing and he will decide about future route. Mr. Brix understands the plan and he says he will call Dr. Allene Pyo office for an appt.   Problem # 2:  PERIPHERAL VASCULAR DISEASE (ICD-443.9) ABI is stable and he has seen his Careers adviser.   Problem # 3:  HYPERTENSION, ESSENTIAL NOS (ICD-401.9) Well controlled with following regimen. Check CMET.  His updated medication list for this problem includes:    Enalapril Maleate 20 Mg Tabs (Enalapril maleate) .Marland Kitchen... Take 1 tablet by mouth once a day    Hydrochlorothiazide 25 Mg Tabs (Hydrochlorothiazide) .Marland Kitchen... Take 1 tablet by mouth once a day    Metoprolol Tartrate 50 Mg Tabs (Metoprolol tartrate) .Marland Kitchen... Take 1 tablet by mouth two times a day  Orders: T-Comprehensive Metabolic Panel ([id number])  BP today: 136/85 Prior BP: 159/99 (05/25/2009)  Labs Reviewed: K+: 4.6 (05/25/2009) Creat: : 0.85 (05/25/2009)   Chol: 179 (05/25/2009)   HDL: 59 (05/25/2009)   LDL: 105 (05/25/2009)   TG: 75 (05/25/2009)  Problem # 4:  DYSLIPIDEMIA (ICD-272.4) He is taking Zocor 20 mg and I informed him to start taking 40 mg daily.  His updated  medication list for this problem includes:    Zocor 40 Mg Tabs (Simvastatin) .Marland Kitchen... Take 1 pill by mouth daily.  Orders: T-Comprehensive Metabolic Panel [id number]) T-Lipid Profile [id number])  Labs Reviewed: SGOT: 26 (05/25/2009)   SGPT: 23 (05/25/2009)   HDL:59 (05/25/2009), 56 (03/14/2008)  LDL:105 (05/25/2009), 91 (57/84/6962)  Chol:179 (05/25/2009), 158 (03/14/2008)  Trig:75 (05/25/2009), 56 (03/14/2008)  Problem # 5:  ANXIETY (ICD-300.00) Stable with following.  His updated medication list for this problem includes:    Clonazepam 1 Mg Tabs (Clonazepam) .Marland Kitchen... Take 1 pill by mouth three times a day    Zoloft 50 Mg Tabs (Sertraline hcl) .Marland Kitchen... Take 1 pill by mouth daily in morning.  Problem # 6:  ALCOHOL ABUSE (ICD-305.00) He is now drinking and smoking in moderate amount. He is now more motivated to quit smoking.   Problem # 7:  BACK PAIN (ICD-724.5) Pt says his pain would be better controlled if he can use one additional vicodin daily. He now uses 120 pill per month and he asks me if he can get 150. I will start giving him 150 from next time.   His updated medication list for this problem includes:    Hydrocodone-acetaminophen 7.5-500 Mg Tabs (Hydrocodone-acetaminophen) .Marland Kitchen... Take 1 tablet by mouth every 6 hours as needed for pain. refills given till 10/7. don't refill till then.    Aspirin 81 Mg Chew (Aspirin) .Marland Kitchen... Take 1 tablet by mouth once a day  Problem # 8:  TOBACCO ABUSE (ICD-305.1) See above. He is not taking chantix.  The following medications were removed from the medication list:    Chantix 0.5 Mg Tabs (Varenicline tartrate) .Marland Kitchen... Take 1 pill by mouth daily for day 1-3, then two times a day for day 4-7.    Chantix 1 Mg Tabs (Varenicline tartrate) .Marland Kitchen... Take 1 pill by mouth two times a day.  Complete Medication List: 1)  Zocor 40 Mg Tabs (Simvastatin) .... Take 1 pill by mouth daily. 2)  Hydrocodone-acetaminophen 7.5-500 Mg Tabs (Hydrocodone-acetaminophen) ....  Take 1 tablet by mouth every 6 hours as needed for pain. refills given till 10/7. don't refill till then. 3)  Enalapril Maleate 20 Mg Tabs (Enalapril maleate) .... Take 1 tablet by mouth once a day 4)  Hydrochlorothiazide 25 Mg Tabs (Hydrochlorothiazide) .... Take 1 tablet by mouth once a day 5)  Aspirin 81 Mg Chew (Aspirin) .... Take 1 tablet by mouth once a day 6)  Omeprazole 20 Mg Cpdr (Omeprazole) .... Take 1 tablet by mouth once a day 7)  Metoprolol Tartrate 50 Mg Tabs (Metoprolol tartrate) .... Take 1 tablet by mouth two times a day 8)  Clonazepam 1 Mg Tabs (Clonazepam) .... Take 1 pill by mouth three times a day 9)  Zoloft 50 Mg Tabs (Sertraline hcl) .... Take 1 pill by mouth daily in morning.  Other Orders: T-Drug Screen-Urine, (single) [phone]) T-Hemoccult Card-Multiple (take home) ([id number])  Patient Instructions: 1)  Please schedule a follow-up appointment in 3 months. 2)  Limit your Sodium (Salt) to less than 2 grams a day(slightly less than 1/2 a teaspoon) to prevent fluid retention, swelling, or worsening of symptoms. 3)  Tobacco is very bad for your health and your loved ones! You Should stop smoking!. 4)  Stop Smoking Tips: Choose a Quit date. Cut down before the Quit date. decide what you will do as a substitute when you feel the urge to smoke(gum,toothpick,exercise). 5)  It is important that you exercise regularly at least 20 minutes 5 times a week. If you develop chest pain, have severe difficulty breathing, or feel very tired , stop exercising immediately and seek medical attention. 6)  Check your Blood Pressure regularly. If it is above: you should make an appointment.  Prevention & Chronic Care Immunizations   Influenza vaccine: Not documented   Influenza  vaccine deferral: Deferred  (10/26/2009)    Tetanus booster: Not documented   Td booster deferral: Deferred  (10/26/2009)    Pneumococcal vaccine: Not documented  Colorectal Screening   Hemoccult: Not  documented   Hemoccult action/deferral: Ordered  (10/26/2009)    Colonoscopy: Not documented   Colonoscopy action/deferral: Refused  (10/26/2009)  Other Screening   PSA: Not documented   PSA action/deferral: Discussion deferred  (10/26/2009)   Smoking status: current  (10/26/2009)   Smoking cessation counseling: yes  (10/26/2009)  Lipids   Total Cholesterol: 179  (05/25/2009)   Lipid panel action/deferral: Lipid Panel ordered   LDL: 105  (05/25/2009)   LDL Direct: Not documented   HDL: 59  (05/25/2009)   Triglycerides: 75  (05/25/2009)    SGOT (AST): 26  (05/25/2009)   BMP action: Ordered   SGPT (ALT): 23  (05/25/2009) CMP ordered    Alkaline phosphatase: 82  (05/25/2009)   Total bilirubin: 0.4  (05/25/2009)    Lipid flowsheet reviewed?: Yes   Progress toward LDL goal: Unchanged  Hypertension   Last Blood Pressure: 136 / 85  (10/26/2009)   Serum creatinine: 0.85  (05/25/2009)   BMP action: Ordered   Serum potassium 4.6  (05/25/2009) CMP ordered     Hypertension flowsheet reviewed?: Yes   Progress toward BP goal: Unchanged  Self-Management Support :   Personal Goals (by the next clinic visit) :      Personal blood pressure goal: 140/90  (05/25/2009)     Personal LDL goal: 130  (05/25/2009)    Patient will work on the following items until the next clinic visit to reach self-care goals:     Medications and monitoring: take my medicines every day, bring all of my medications to every visit  (10/26/2009)     Eating: drink diet soda or water instead of juice or soda, eat more vegetables, use fresh or frozen vegetables, eat foods that are low in salt, eat baked foods instead of fried foods, eat fruit for snacks and desserts, limit or avoid alcohol  (10/26/2009)     Activity: take a 30 minute walk every day  (10/26/2009)    Hypertension self-management support: Written self-care plan, Education handout  (10/26/2009)   Hypertension self-care plan printed.   Hypertension  education handout printed    Lipid self-management support: Written self-care plan, Education handout  (10/26/2009)   Lipid self-care plan printed.   Lipid education handout printed   Nursing Instructions: Provide Hemoccult cards with instructions (see order)   Process Orders Check Orders Results:     [facility]: ABN not required for this insurance Tests Sent for requisitioning (October 27, 2009 8:48 AM):     10/26/2009: [facility] -- T-Comprehensive Metabolic Panel [[id number]] (signed)     10/26/2009: [facility] -- T-Lipid Profile [id number] (signed)     10/26/2009: [facility] -- T-Drug Screen-Urine, (single) [[id number]] (signed)     Vital Signs:  Patient profile:   55 year old male Height:      71.5 inches (181.61 cm) Weight:      168 pounds (76.36 kg) BMI:     23.19 Temp:     98.7 degrees F (37.06 degrees C) oral Pulse rate:   95 / minute BP sitting:   136 / 85  (right arm)  Vitals Entered By: Angelina Ok RN (October 26, 2009 2:51 PM)   Process Orders Check Orders Results:     [facility]: ABN not required for this  insurance Tests Sent for requisitioning (October 27, 2009 8:48 AM):     10/26/2009: [facility] -- T-Comprehensive Metabolic Panel [[id number]] (signed)     10/26/2009: [facility] -- T-Lipid Profile [phone] (signed)     10/26/2009: [facility] -- T-Drug Screen-Urine, (single) [phone] (signed)

## 2010-09-07 NOTE — Progress Notes (Signed)
Summary: Refill/gh  Phone Note Refill Request Message from:  Fax from Pharmacy on March 01, 2010 2:32 PM  Refills Requested: Medication #1:  ENALAPRIL MALEATE 20 MG TABS Take 1 tablet by mouth once a day   Last Refilled: 01/15/2010 Hospital discharge on 02/04/2010   Method Requested: Electronic Initial call taken by: Angelina Ok RN,  March 01, 2010 2:32 PM  Follow-up for Phone Call        Refilled electronically.  Follow-up by: Margarito Liner MD,  March 01, 2010 2:58 PM    Prescriptions: ENALAPRIL MALEATE 20 MG TABS (ENALAPRIL MALEATE) Take 1 tablet by mouth once a day  #30 x 0   Entered and Authorized by:   Margarito Liner MD   Signed by:   Margarito Liner MD on 03/01/2010   Method used:   Electronically to        [facility]  [address] [phone]* (retail)       [address]       [place], [place]  [address]       Ph: [phone] or [phone]       Fax: [phone]   RxID:   [id number]

## 2010-09-07 NOTE — Progress Notes (Signed)
Summary: refill/ hla  Phone Note Refill Request Message from:  Fax from Pharmacy on March 17, 2010 5:34 PM  Refills Requested: Medication #1:  ZOCOR 40 MG TABS take 1 pill by mouth daily.   Dosage confirmed as above?Dosage Confirmed   Last Refilled: 4/15 Initial call taken by: Marin Roberts RN,  March 17, 2010 5:35 PM  Follow-up for Phone Call       Follow-up by: Blanch Media MD,  March 17, 2010 5:49 PM    Prescriptions: ZOCOR 40 MG TABS (SIMVASTATIN) take 1 pill by mouth daily.  #30 x 2   Entered and Authorized by:   Blanch Media MD   Signed by:   Blanch Media MD on 03/17/2010   Method used:   Faxed to ...       [facility] (retail)       [address] [place], [place]  [address]       Ph: [phone]       Fax: [phone]   RxID:   [id number]

## 2010-09-07 NOTE — Initial Assessments (Signed)
INTERNAL MEDICINE ADMISSION HISTORY AND PHYSICAL  First Contact: Dr. Scot Dock [phone] Second Contact: Dr. Cena Benton [phone]  PCP:Dr. Cathey Endow  CC: Chest pain  HPI: Andrew [name] is a 55 year old man with pmh significant for HTN, HLD, PVD who presents to the ED for chest pain. He states this chest pain began 3-4 hours prior to coming to the ED. He states he was watching television when it began. He describes the pain to be electrifying and acute in onset. He said the pain lasted for 10 minutes. He denies diaphoresis, pain radiating anywhere, no radiation to arm or jaw, or sob. He states this is first episode of chest pain, and thinks it might have been due to panic attack as he has been out of his clonazepam for about a week. He denies having abdominal pain, N/V, changes in bowel or urinary habits. No headache, fever, or chills.   ALLERGIES: NKDA  PAST MEDICAL HISTORY:  Anxiety Hyperlipidemia Hypertension Peripheral vascular disease - S/P aortogram with bilateral LE runoff arteriography in 11/2001, which showed moderate infrarenal aortic atherosclerotic disease, severe iliac occlusive disease bilaterally, bilat tibial vessel occlusion, s/p aortobifemoral bipass 6/03, and 12/03 Glucose intolerance Tobacco abuse, hx of Thoracic kyphosis Back pain Alcohol abuse, hx of   MEDICATIONS:  ZOCOR 40 MG TABS (SIMVASTATIN) take 1 pill by mouth daily. HYDROCODONE-ACETAMINOPHEN 7.5-500 MG TABS (HYDROCODONE-ACETAMINOPHEN) Take 1-2  tables by mouth every 6 hours as needed for pain. ENALAPRIL MALEATE 20 MG TABS (ENALAPRIL MALEATE) Take 1 tablet by mouth once a day HYDROCHLOROTHIAZIDE 25 MG TABS (HYDROCHLOROTHIAZIDE) Take 1 tablet by mouth once a day ASPIRIN 81 MG CHEW (ASPIRIN) Take 1 tablet by mouth once a day OMEPRAZOLE 20 MG CPDR (OMEPRAZOLE) Take 1 tablet by mouth once a day METOPROLOL TARTRATE 50 MG TABS (METOPROLOL TARTRATE) Take 1 tablet by mouth two times a day CLONAZEPAM 1 MG  TABS (CLONAZEPAM) take  1 pill by mouth three times a day ZOLOFT 50 MG TABS (SERTRALINE HCL) take 1 pill by mouth daily in morning.   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   Head:  normocephalic and atraumatic.   Eyes:  there is right under eye bruising from where patient hit his head by walking into the bathroom door, vision grossly intact, pupils equal, pupils round, pupils reactive to light, no injection and anicteric.   Mouth:  pharynx pink and moist, no erythema, and no exudates.   Neck:  supple, full ROM, no thyromegaly, no JVD, and no carotid bruits.   Lungs:  normal respiratory effort, no accessory muscle use, normal breath sounds, no crackles, and no wheezes.  Heart:  tachycardic, regular rhythm, no murmur, no gallop, and no rub.   Abdomen:  soft, non-tender, normal bowel sounds, no distention, no guarding, no rebound tenderness, no hepatomegaly, and no splenomegaly.   Msk:  no joint swelling, no joint warmth,  and no redness over joints Pulses:  2+ DP/PT pulses bilaterally Extremities:  No cyanosis, clubbing, edema  Neurologic:  alert & oriented X3, cranial nerves II-XII intact, strength normal in all extremities  Psych:  Oriented X3, memory intact for recent and remote, normally interactive, good eye contact, not anxious appearing, and not depressed appearing, speech is slightly  slurred  LABS:  Sodium (NA)                              148        h      135-145          mEq/L  Potassium (K)                            3.4        l      3.5-5.1          mEq/L  Chloride                                 111               96-112           mEq/L  CO2                                      28                19-32            mEq/L  Glucose                                  104        h      70-99            mg/dL  BUN                                      5          l      6-23             mg/dL  Creatinine                               0.75              0.4-1.5          mg/dL  GFR, Est Non African American            >60               >60              mL/min  GFR, Est African American                >60               >60              mL/min    Oversized comment, see footnote  1  Bilirubin,  Total                         0.4               0.3-1.2          mg/dL  Alkaline Phosphatase                     78                39-117           U/L  SGOT (AST)                               23                0-37             U/L  SGPT (ALT)                               17                0-53             U/L  Total  Protein                           6.2               6.0-8.3          g/dL  Albumin-Blood                            3.5               3.5-5.2          g/dL  Calcium                                  9.1               8.4-10.5         mg/dL  Alcohol                                  244        h      0-10             mg/dL  I-STAT-Comment                           SEE NOTE.    Oversized comment, see footnote  1  CKMB, POC                                <1.0       l      1.0-8.0          ng/mL  Troponin I, POC                          <0.05  0.00-0.09        ng/mL  Myoglobin, POC                           40.2              12-200           ng/mL  I-STAT-Comment                           SEE NOTE.    Oversized comment, see footnote  1  CKMB, POC                                 <1.0       l      1.0-8.0          ng/mL  Troponin I, POC                          <0.05             0.00-0.09        ng/mL  Myoglobin, POC                           46.1              12-200           ng/mL  WBC                                      7.0               4.0-10.5         K/uL  RBC                                      4.36              4.22-5.81        MIL/uL  Hemoglobin (HGB)                         13.6              13.0-17.0        g/dL  Hematocrit (HCT)                         39.3              39.0-52.0        %  MCV                                      90.3              78.0-100.0       fL  MCH -                                    31.1  26.0-34.0        pg  MCHC                                     34.5              30.0-36.0        g/dL  RDW                                      14.3              11.5-15.5        %  Platelet Count (PLT)                     199               150-400          K/uL  Neutrophils, %                           61                43-77            %  Lymphocytes, %                           26                12-46            %  Monocytes, %                             8                 3-12             %  Eosinophils, %                           4                 0-5              %  Basophils, %                             1                 0-1              %  Neutrophils, Absolute                    4.3               1.7-7.7          K/uL  Lymphocytes, Absolute                    1.8               0.7-4.0          K/uL  Monocytes, Absolute  0.5               0.1-1.0          K/uL  Eosinophils, Absolute                    0.3               0.0-0.7          K/uL  Basophils, Absolute                      0.1               0.0-0.1          K/uL  IMPRESSION:    1.  Changes of COPD and chronic bronchitis.   2.  Probable pulmonary arterial hypertension.   3.  Borderline cardiomegaly.   BYPASS GRAFT EVAL  07/2009  IMPRESSION:   1. Patent left femoral-peroneal artery bypass graft.   2. Stable ankle brachial indices bilaterally.      ASSESSMENT AND PLAN:  1) Chest pain - In this 55 year old man with multiple risk factors such as PVD, HTN, HLD, positive family history and smoker, ACS must be ruled out. EKG does not show any new ischemic changes and intial POC cardiac markers have been negative. His chest pain may be 2/2 acute panic attack as he does have hx of severe anxiety disorder.   Plan -Admit to Telemetry Bed -Cycle cardiac enzymes x3 -Serial EKGs -2D Echo to establish baseline function -Check UDS (pt chronically on opiates and benzo)  -Continue b-blocker, ACE-I, ASA, Statin   2) Acute alcohol abuse - with alcohol level of 244. Will start patient on CIWA protocol. SW consult for substance abuse cesssation. Will also start patient on thiamine, and folic acid.   3) PVD - stable. Last Bypass eval 07/2009, wnl.   4) HTN - Stable, will continue with home meds of enalapril and metoprolol  5) HLD - Will check FLP, as last FLP in 05/2009. Will continue Statin.   6) Anxiety - Chronic anxiety and depression. Will continue zoloft and give pt Ativan as needed.   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 Pt has been recommended follow up, however pt states he did not follow up with surgeon due to cost. Will defer to primary team if they want to pursue surgical consult. His lip appears stable, growth does not appear to have increased in size.   8) Back pain - L2-5 mild to moderate stenosis per MRI 2003. Pt is on vicodin for pain management, however patient has been finishing his prescriptions rather early. Will resume vicodin, however with patient taking >150 tablets monthly and abusing this medication as is, it would be worthwhile to consider starting this patient on long acting opiate.   9) VTE PROPH: lovenox  Attending Physician:  I performed  and/or observed a history and physical examination of the patient.  I discussed the case with the residents as noted and reviewed the residents' notes.  I agree with the findings and plan--please refer to the attending physician note for more details.  Signature  Printed Name

## 2010-09-07 NOTE — Progress Notes (Signed)
Summary: refill/ hla  Phone Note Refill Request Message from:  Patient on July 14, 2010 10:39 AM  Refills Requested: Medication #1:  HYDROCODONE-ACETAMINOPHEN 7.5-500 MG TABS Take 1-2  tables by mouth every 6 hours as needed for pain. #150 equals a 30 day supply   Dosage confirmed as above?Dosage Confirmed   Last Refilled: 11/8 last visit 11/3, last uds 05/10/2010  Initial call taken by: Marin Roberts RN,  July 14, 2010 10:39 AM  Follow-up for Phone Call        I contacted pt regarding this request as my last note stated that pts back pain was resolved.  Pt states that this is because he was on vicodin at the time.  I would recomend refilling the prescription at this time for 1 month # 150.  Follow-up by: Sinda Du MD,  July 14, 2010 1:12 PM  Additional Follow-up for Phone Call Additional follow up Details #1::        Rx called to pharmacy Additional Follow-up by: Marin Roberts RN,  July 14, 2010 2:14 PM    Prescriptions: HYDROCODONE-ACETAMINOPHEN 7.5-500 MG TABS (HYDROCODONE-ACETAMINOPHEN) Take 1-2  tables by mouth every 6 hours as needed for pain. #150 equals a 30 day supply  #150 x 0   Entered and Authorized by:   Zoila Shutter MD   Signed by:   Zoila Shutter MD on 07/14/2010   Method used:   Telephoned to ...       [facility] [address] [phone]* (retail)       [address]       [place], [place]  [address]       Ph: [phone] or [phone]       Fax: [phone]   RxID:   [id number]

## 2010-09-07 NOTE — Progress Notes (Signed)
Summary: refill, increase/ hla  Phone Note Refill Request Message from:  Patient on November 11, 2009 2:56 PM  Refills Requested: Medication #1:  HYDROCODONE-ACETAMINOPHEN 7.5-500 MG TABS Take 1 tablet by mouth every 6 hours as needed for pain. Refills given till 10/7. Don't refill till then. PT WANTS 150, I SEE IN YOUR NOTE AT LAST VISIT YOU SAID THAT THE NEXT REFILL WOULD BE FOR 150... HE SAYS HE IS OUT NOW  Initial call taken by: Marin Roberts RN,  November 11, 2009 2:56 PM  Follow-up for Phone Call        That is true.  Follow-up by: Jason Coop MD,  November 11, 2009 4:09 PM    New/Updated Medications: HYDROCODONE-ACETAMINOPHEN 7.5-500 MG TABS (HYDROCODONE-ACETAMINOPHEN) Take 1 tablet by mouth every 6 hours as needed for pain. Prescriptions: HYDROCODONE-ACETAMINOPHEN 7.5-500 MG TABS (HYDROCODONE-ACETAMINOPHEN) Take 1 tablet by mouth every 6 hours as needed for pain.  #150 x 0   Entered and Authorized by:   Jason Coop MD   Signed by:   Jason Coop MD on 11/11/2009   Method used:   Telephoned to ...       [facility] [address] [phone]* (retail)       [address]       [place], [place]  [address]       Ph: [phone] or [phone]       Fax: [phone]   RxID:   [id number]

## 2010-09-07 NOTE — Progress Notes (Signed)
Summary: refill/ hla  Phone Note Refill Request Message from:  Fax from Pharmacy on October 19, 2009 11:05 AM  Refills Requested: Medication #1:  CLONAZEPAM 1 MG  TABS take 1 pill by mouth three times a day   Last Refilled: 2/11 Initial call taken by: Marin Roberts RN,  October 19, 2009 11:05 AM  Follow-up for Phone Call       Follow-up by: Jason Coop MD,  October 19, 2009 12:26 PM    Prescriptions: CLONAZEPAM 1 MG  TABS (CLONAZEPAM) take 1 pill by mouth three times a day  #90 x 3   Entered and Authorized by:   Jason Coop MD   Signed by:   Jason Coop MD on 10/19/2009   Method used:   Telephoned to ...       [facility] (retail)       [address] [place], [place]  [address]       Ph: [phone]       Fax: [phone]   RxID:   [id number]   Appended Document: refill/ hla Rx called to pharmacy

## 2010-09-07 NOTE — Miscellaneous (Signed)
Summary: med refillgp  Clinical Lists Changes  Medications: Rx of CLONAZEPAM 1 MG  TABS (CLONAZEPAM) take 1 pill by mouth three times a day;  #90 x 3;  Signed;  Entered by: Ulyess Mort MD;  Authorized by: Ulyess Mort MD;  Method used: [facility] [address] [phone]*, [address] [place], [place]  [address], Ph: [phone] or [phone], Fax: [phone]    Prescriptions: CLONAZEPAM 1 MG  TABS (CLONAZEPAM) take 1 pill by mouth three times a day  #90 x 3   Entered and Authorized by:   Ulyess Mort MD   Signed by:   Ulyess Mort MD on 06/23/2010   Method used:   Telephoned to ...       [facility] [address] [phone]* (retail)       [address]       [place], [place]  [address]       Ph: [phone] or [phone]       Fax: [phone]   RxID:   [id number]  [facility]. Chinita Pester RN  June 23, 2010 10:50 AM

## 2010-09-07 NOTE — Progress Notes (Signed)
Summary: phone/gg  Phone Note Refill Request  on May 14, 2010 1:47 PM  Refills Requested: Medication #1:  HYDROCODONE-ACETAMINOPHEN 7.5-500 MG TABS Take 1-2  tables by mouth every 6 hours as needed for pain. #150 equals a 30 day supply   Last Refilled: 04/15/2010  Method Requested: Telephone to Pharmacy Initial call taken by: Merrie Roof RN,  May 14, 2010 1:47 PM  Follow-up for Phone Call        This may be refilled tomorrow 05/15/10.  Additional Follow-up for Phone Call Additional follow up Details #1::        Rx called to pharmacy for refill on 10/8 Additional Follow-up by: Merrie Roof RN,  May 14, 2010 5:33 PM    Prescriptions: HYDROCODONE-ACETAMINOPHEN 7.5-500 MG TABS (HYDROCODONE-ACETAMINOPHEN) Take 1-2  tables by mouth every 6 hours as needed for pain. #150 equals a 30 day supply  #150 x 0   Entered and Authorized by:   Zoila Shutter MD   Signed by:   Zoila Shutter MD on 05/14/2010   Method used:   Telephoned to ...       [facility] [address] [phone]* (retail)       [address]       [place], [place]  [address]       Ph: [phone] or [phone]       Fax: [phone]   RxID:   [id number]

## 2010-09-07 NOTE — Progress Notes (Signed)
Summary: PREVENTIVE COLONOSCOPY  Phone Note Outgoing Call   Summary of Call: No info found in EMR or ECHART in regards to a colonoscopy.  Reviewed paper chart as well no information listed in chart.  Also placed call to patient with no repsonse.  Patient had a [place] CERTIFIED CARD back in 10/10.  I do not see where he has been in to see Rudell Cobb for a RECERTIFICATION. Initial call taken by: Shon Hough,  June 10, 2010 8:49 AM

## 2010-09-07 NOTE — Assessment & Plan Note (Signed)
Summary: EST-CK/FU/MEDS/CFB   Vital Signs:  Patient profile:   55 year old male Weight:      164.6 pounds (74.82 kg) BMI:     22.72 Temp:     98.6 degrees F (37.00 degrees C) oral Pulse rate:   90 / minute BP sitting:   129 / 83  (right arm) Cuff size:   regular  Vitals Entered By: Cynda Familia Duncan Dull) (May 10, 2010 2:11 PM) CC: med refill-cant get pain med refilled until Fri, now his back is hurting  Is Patient Diabetic? No Pain Assessment Patient in pain? yes     Location: back  Intensity: 5 Type: sharp Onset of pain  Chronic Nutritional Status BMI of 19 -24 = normal  Have you ever been in a relationship where you felt threatened, hurt or afraid?No   Does patient need assistance? Functional Status Self care Ambulation Normal   Primary Care Provider:  Sinda Du MD  CC:  med refill-cant get pain med refilled until Fri and now his back is hurting .  History of Present Illness: Patient is a 55 year old man with PMH as described in EMR is here today for a follow up.  1. Pain meds for back pain:Patient has thoracic kyphosis.  Patient has been getting 150 Vicodin tabs every month for back pain and is asking for increased amount of pain meds as 5 pills a day are not helping him with the pain. Dr Aleene Davidson increased the tabs from 120 to 150 a months 6 months agoas he felt 4 a day was not enough. patient does not want to undergo PT as he has been through that path and says that it hasnt helped in the past at all and it makes the pain worse.  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 Patient says me to schedule an appontment next week with Dr Ezzard Standing. The patient has been diagnosed with this cancer in the past, and he had a surgical excision, but the margins were not clear.  3. Alcoholism: Quit since he was discharged from the hospital but had 2 beers last week with friends. He is not attending AA meeting anymore. Denies seeing SW today. I emphasised the fact  that he should stay away from alcohol all together.  4. Hyperlipidemia. Will check lipid panel today.  5. Smoking: Still smoking less then a pack a day and interested in quitting. Started on  Wellbutrin but hasnt been able to afford it. I will change it to the one on $4 list.  6. Anxiety: Patient hasnt been taking his zoloft at all and has been taking just clonazepam.  Will give flu shot and tetanus shot today.   He denies any new sicknesses or hospitalizations, no chest pain episodes, no fevers, no chills, no abdominal or urinary concerns. No recent changes in appetite, weight.   Preventive Screening-Counseling & Management  Alcohol-Tobacco     Alcohol drinks/day: 0     Alcohol type: spirits     Alcohol Counseling: to STOP drinking     Smoking Status: current     Smoking Cessation Counseling: yes     Packs/Day: <1     Year Started: STARTED BACK SMOKING RECENTLY     Year Quit: 2 weeks ago     Tobacco Counseling: to quit use of tobacco products  Problems Prior to Update: 1)  Chest Pain  (ICD-786.50) 2)  Other Malignant Neoplasm of Skin of Lip  (ICD-173.0) 3)  Other Dental Caries  (  ICD-521.09) 4)  Preventive Health Care  (ICD-V70.0) 5)  Glucose Intolerance, Hx of  (ICD-V12.2) 6)  Peripheral Vascular Disease  (ICD-443.9) 7)  Hypertension, Essential Nos  (ICD-401.9) 8)  Dyslipidemia  (ICD-272.4) 9)  Anxiety  (ICD-300.00) 10)  Thoracic Kyphosis  (ICD-737.19) 11)  Alcohol Abuse  (ICD-305.00) 12)  Back Pain  (ICD-724.5) 13)  Tobacco Abuse  (ICD-305.1)  Medications Prior to Update: 1)  Zocor 40 Mg Tabs (Simvastatin) .... Take 1 Pill By Mouth Daily. 2)  Hydrocodone-Acetaminophen 7.5-500 Mg Tabs (Hydrocodone-Acetaminophen) .... Take 1-2  Tables By Mouth Every 6 Hours As Needed For Pain. #150 Equals A 30 Day Supply 3)  Enalapril Maleate 20 Mg Tabs (Enalapril Maleate) .... Take 1 Tablet By Mouth Once A Day 4)  Hydrochlorothiazide 25 Mg Tabs (Hydrochlorothiazide) .... Take 1 Tablet By  Mouth Once A Day 5)  Aspirin 81 Mg Chew (Aspirin) .... Take 1 Tablet By Mouth Once A Day 6)  Omeprazole 20 Mg Cpdr (Omeprazole) .... Take 1 Tablet By Mouth Once A Day 7)  Metoprolol Tartrate 50 Mg Tabs (Metoprolol Tartrate) .... Take 1 Tablet By Mouth Two Times A Day 8)  Clonazepam 1 Mg  Tabs (Clonazepam) .... Take 1 Pill By Mouth Three Times A Day 9)  Zoloft 50 Mg Tabs (Sertraline Hcl) .... Take 1 Pill By Mouth Daily in Morning. 10)  Folic Acid 1 Mg Tabs (Folic Acid) .... Take 1 Tablet By Mouth Once A Day 11)  Thiamine Hcl 100 Mg Tabs (Thiamine Hcl) .... Take 1 Tablet By Mouth Once A Day 12)  Bupropion Hcl 150 Mg Xr12h-Tab (Bupropion Hcl) .... Take 1 Tablet By Mouth Once A Day  Current Medications (verified): 1)  Zocor 40 Mg Tabs (Simvastatin) .... Take 1 Pill By Mouth Daily. 2)  Hydrocodone-Acetaminophen 7.5-500 Mg Tabs (Hydrocodone-Acetaminophen) .... Take 1-2  Tables By Mouth Every 6 Hours As Needed For Pain. #150 Equals A 30 Day Supply 3)  Aspirin 81 Mg Chew (Aspirin) .... Take 1 Tablet By Mouth Once A Day 4)  Omeprazole 20 Mg Cpdr (Omeprazole) .... Take 1 Tablet By Mouth Once A Day 5)  Metoprolol Tartrate 50 Mg Tabs (Metoprolol Tartrate) .... Take 1 Tablet By Mouth Two Times A Day 6)  Clonazepam 1 Mg  Tabs (Clonazepam) .... Take 1 Pill By Mouth Three Times A Day 7)  Zoloft 50 Mg Tabs (Sertraline Hcl) .... Take 1 Pill By Mouth Daily in Morning. 8)  Bupropion Hcl 75 Mg Tabs (Bupropion Hcl) .... Take 1 Tablet By Mouth Two Times A Day 9)  Enalapril-Hydrochlorothiazide 10-25 Mg Tabs (Enalapril-Hydrochlorothiazide) .... Take 1 Tablet By Mouth Once A Day 10)  B Complex Formula 1  Tabs (Vitamins-Lipotropics) .... Take 1 Tablet By Mouth Once A Day  Allergies (verified): No Known Drug Allergies  Past History:  Past Medical History: Last updated: 05/10/2006 Anxiety Hyperlipidemia Hypertension Peripheral vascular disease Glucose intolerance Tobacco abuse, hx of Thoracic kyphosis Back  pain Alcohol abuse, hx of  Risk Factors: Alcohol Use: 0 (05/10/2010) Exercise: no (03/04/2010)  Risk Factors: Smoking Status: current (05/10/2010) Packs/Day: <1 (05/10/2010)  Review of Systems      See HPI  Physical Exam  Additional Exam:  Gen: AOx3, in no acute distress Eyes: PERRL, EOMI ENT:MMM, No erythema noted in posterior pharynx Neck: No JVD, No LAP Chest: CTAB with  good respiratory effort CVS: regular rhythmic rate, NO M/R/G, S1 S2 normal Abdo: soft,ND, BS+x4, Non tender and No hepatosplenomegaly EXT: No odema noted Neuro: Non focal, gait is normal  Skin: no rashes noted.    Impression & Recommendations:  Problem # 1:  OTHER MALIGNANT NEOPLASM OF SKIN OF LIP (ICD-173.0) Assessment Comment Only I will get him a surgery appointment to get follow up on his lip cancer as he did not set it up by himself. No visible extension seen on exam. Orders: Surgical Referral (Surgery)  Problem # 2:  PREVENTIVE HEALTH CARE (ICD-V70.0) Assessment: Comment Only Flu shot and tetanus shot today.  Problem # 3:  HYPERTENSION, ESSENTIAL NOS (ICD-401.9) Assessment: Improved I changed his BP meds to the one to a combination pill as requested. It may have cut down his enalapril dose but he has been pretty well controlled on his current regimen and will go to combination pill twice daily if he is hypertensive at follow up. Will check Cmet for K and Crt.   The following medications were removed from the medication list:    Enalapril Maleate 20 Mg Tabs (Enalapril maleate) .Marland Kitchen... Take 1 tablet by mouth once a day    Hydrochlorothiazide 25 Mg Tabs (Hydrochlorothiazide) .Marland Kitchen... Take 1 tablet by mouth once a day His updated medication list for this problem includes:    Metoprolol Tartrate 50 Mg Tabs (Metoprolol tartrate) .Marland Kitchen... Take 1 tablet by mouth two times a day    Enalapril-hydrochlorothiazide 10-25 Mg Tabs (Enalapril-hydrochlorothiazide) .Marland Kitchen... Take 1 tablet by mouth once a day  Problem  # 4:  DYSLIPIDEMIA (ICD-272.4) Assessment: Comment Only Will check lipid profile today. His updated medication list for this problem includes:    Zocor 40 Mg Tabs (Simvastatin) .Marland Kitchen... Take 1 pill by mouth daily.  Orders: T-Lipid Profile [id number])  Labs Reviewed: SGOT: 26 (05/25/2009)   SGPT: 23 (05/25/2009)   HDL:59 (05/25/2009), 56 (03/14/2008)  LDL:105 (05/25/2009), 91 (96/29/5284)  Chol:179 (05/25/2009), 158 (03/14/2008)  Trig:75 (05/25/2009), 56 (03/14/2008)  Problem # 5:  ANXIETY (ICD-300.00) Assessment: Comment Only Patient hasnt been using his zoloft and has been taking clonazepam for anxiety on a regular basis. I told him that he needs to start his Zoloft if he needs to control his anxiety and we will not be refilling his clonazepam indefinately if he doesnt start on zoloft. He agrred to start zoloft. will follow up in 1 monthfor compliance.  His updated medication list for this problem includes:    Clonazepam 1 Mg Tabs (Clonazepam) .Marland Kitchen... Take 1 pill by mouth three times a day    Zoloft 50 Mg Tabs (Sertraline hcl) .Marland Kitchen... Take 1 pill by mouth daily in morning.    Bupropion Hcl 75 Mg Tabs (Bupropion hcl) .Marland Kitchen... Take 1 tablet by mouth two times a day  Discussed medication use and relaxation techniques.   Problem # 6:  BACK PAIN (ICD-724.5) Assessment: Comment Only Signed pain contract today. Pian meds were increased from 120 to 150 tabs in March by Dr Aleene Davidson. Will not go up on Pain meds at this time. Discussed in detail the risks of being on acetaminophen specially in the setting of chronic alcohol abuse.  His updated medication list for this problem includes:    Hydrocodone-acetaminophen 7.5-500 Mg Tabs (Hydrocodone-acetaminophen) .Marland Kitchen... Take 1-2  tables by mouth every 6 hours as needed for pain. #150 equals a 30 day supply    Aspirin 81 Mg Chew (Aspirin) .Marland Kitchen... Take 1 tablet by mouth once a day  Orders: T-Drug Screen-Urine, (single) [phone])  Problem # 7:  TOBACCO  ABUSE (ICD-305.1) Assessment: Unchanged Started him on wellbutrin 75 mg two times a day as it is on $4 list. Denied  social work consult at this time. Patient was counseled on smoking cessation strategies including behavior modification options.   Complete Medication List: 1)  Zocor 40 Mg Tabs (Simvastatin) .... Take 1 pill by mouth daily. 2)  Hydrocodone-acetaminophen 7.5-500 Mg Tabs (Hydrocodone-acetaminophen) .... Take 1-2  tables by mouth every 6 hours as needed for pain. #150 equals a 30 day supply 3)  Aspirin 81 Mg Chew (Aspirin) .... Take 1 tablet by mouth once a day 4)  Omeprazole 20 Mg Cpdr (Omeprazole) .... Take 1 tablet by mouth once a day 5)  Metoprolol Tartrate 50 Mg Tabs (Metoprolol tartrate) .... Take 1 tablet by mouth two times a day 6)  Clonazepam 1 Mg Tabs (Clonazepam) .... Take 1 pill by mouth three times a day 7)  Zoloft 50 Mg Tabs (Sertraline hcl) .... Take 1 pill by mouth daily in morning. 8)  Bupropion Hcl 75 Mg Tabs (Bupropion hcl) .... Take 1 tablet by mouth two times a day 9)  Enalapril-hydrochlorothiazide 10-25 Mg Tabs (Enalapril-hydrochlorothiazide) .... Take 1 tablet by mouth once a day 10)  B Complex Formula 1 Tabs (Vitamins-lipotropics) .... Take 1 tablet by mouth once a day  Other Orders: T-Comprehensive Metabolic Panel [id number]) Admin 1st Vaccine (11914) Flu Vaccine 68yrs + (78295) Tdap => 50yrs IM (62130) Admin of Any Addtl Vaccine (86578)  Patient Instructions: 1)  Please schedule an appointment with your primary doctor in 1 month or as needed. 2)  Tobacco is very bad for your health and your loved ones! You Should stop smoking!. 3)  Stop Smoking Tips: Choose a Quit date. Cut down before the Quit date. decide what you will do as a substitute when you feel the urge to smoke(gum,toothpick,exercise). 4)  It is not healthy  for men to drink more than 2-3 drinks per day or for women to drink more than 1-2 drinks per day. 5)  Schedule a  colonoscopy/sigmoidoscopy to help detect colon cancer. 6)  Take an Aspirin every day. Prescriptions: BUPROPION HCL 75 MG TABS (BUPROPION HCL) Take 1 tablet by mouth two times a day  #60 x 3   Entered and Authorized by:   Lars Mage MD   Signed by:   Lars Mage MD on 05/10/2010   Method used:   Electronically to        [facility] [phone]* (retail)       [address]       [place], [place]  [address]       Ph: [phone]       Fax: [phone]   RxID:   [id number] ZOLOFT 50 MG TABS (SERTRALINE HCL) take 1 pill by mouth daily in morning.  #30 x 1   Entered and Authorized by:   Lars Mage MD   Signed by:   Lars Mage MD on 05/10/2010   Method used:   Electronically to        [facility] [phone]* (retail)       [address]       [place], [place]  [address]       Ph: [phone]       Fax: [phone]   RxID:   [id number] ENALAPRIL-HYDROCHLOROTHIAZIDE 10-25 MG TABS (ENALAPRIL-HYDROCHLOROTHIAZIDE) Take 1 tablet by mouth once a day  #30 x 11   Entered and Authorized by:   Lars Mage MD   Signed by:   Lars Mage MD on 05/10/2010   Method used:   Electronically to  [facility] [address] [phone]* (retail)       [address]       [place], [place]  [address]       Ph: [phone]       Fax: [phone]   RxID:   [id number]  Process Orders Check Orders Results:     [facility]: ABN not required for this insurance Tests Sent for requisitioning (May 10, 2010 5:01 PM):     05/10/2010: [facility] -- T-Comprehensive Metabolic Panel [[id number]] (signed)     05/10/2010: [facility] -- T-Lipid Profile [phone] (signed)     05/10/2010: [facility] -- T-Drug Screen-Urine, (single) [id number] (signed)     Prevention & Chronic Care Immunizations   Influenza vaccine: Fluvax 3+  (05/10/2010)   Influenza vaccine deferral: Deferred  (10/26/2009)    Tetanus booster: 05/10/2010:  Tdap   Td booster deferral: Deferred  (10/26/2009)    Pneumococcal vaccine: Not documented  Colorectal Screening   Hemoccult: Not documented   Hemoccult action/deferral: Refused  (05/10/2010)    Colonoscopy: Not documented   Colonoscopy action/deferral: Refused  (05/10/2010)  Other Screening   PSA: Not documented   PSA action/deferral: Discussed-decision deferred  (05/10/2010)   Smoking status: current  (05/10/2010)   Smoking cessation counseling: yes  (05/10/2010)  Lipids   Total Cholesterol: 179  (05/25/2009)   Lipid panel action/deferral: Lipid Panel ordered   LDL: 105  (05/25/2009)   LDL Direct: Not documented   HDL: 59  (05/25/2009)   Triglycerides: 75  (05/25/2009)    SGOT (AST): 26  (05/25/2009)   BMP action: Ordered   SGPT (ALT): 23  (05/25/2009) CMP ordered    Alkaline phosphatase: 82  (05/25/2009)   Total bilirubin: 0.4  (05/25/2009)    Lipid flowsheet reviewed?: Yes   Progress toward LDL goal: Unchanged  Hypertension   Last Blood Pressure: 129 / 83  (05/10/2010)   Serum creatinine: 0.85  (05/25/2009)   BMP action: Ordered   Serum potassium 4.6  (05/25/2009) CMP ordered     Hypertension flowsheet reviewed?: Yes   Progress toward BP goal: At goal  Self-Management Support :   Personal Goals (by the next clinic visit) :      Personal blood pressure goal: 140/90  (05/25/2009)     Personal LDL goal: 100  (05/10/2010)    Patient will work on the following items until the next clinic visit to reach self-care goals:     Medications and monitoring: take my medicines every day  (05/10/2010)     Eating: eat foods that are low in salt  (05/10/2010)     Activity: take a 30 minute walk every day  (03/04/2010)    Hypertension self-management support: Written self-care plan  (05/10/2010)   Hypertension self-care plan printed.    Lipid self-management support: Written self-care plan  (05/10/2010)   Lipid self-care plan printed.   Nursing Instructions: Give  Flu vaccine today Give tetanus booster today    Process Orders Check Orders Results:     [facility]: ABN not required for this insurance Tests Sent for requisitioning (May 10, 2010 5:01 PM):     05/10/2010: [facility] -- T-Comprehensive Metabolic Panel [[id number]] (signed)     05/10/2010: [facility] -- T-Lipid Profile [phone] (signed)     05/10/2010: [facility] -- T-Drug Screen-Urine, (single) [[id number]] (signed)   Flu Vaccine Consent Questions     Do you have a history of severe allergic reactions  to this vaccine? no    Any prior history of allergic reactions to egg and/or gelatin? no    Do you have a sensitivity to the preservative Thimersol? no    Do you have a past history of Guillan-Barre Syndrome? no    Do you currently have an acute febrile illness? no    Have you ever had a severe reaction to latex? no    Vaccine information given and explained to patient? yes    Are you currently pregnant? no    Lot Number:[id number]   Exp Date:02/05/2011   Manufacturer: [employer]    Site Given  Left Deltoid IM.[facility])  May 10, 2010 4:56 PM      05/10/2010: [facility] -- T-Drug Screen-Urine, (single) [[id number]] (signed)    .opcflu    Immunizations Administered:  Tetanus Vaccine:    Vaccine Type: Tdap    Site: right deltoid    Mfr: GlaxoSmithKline    Dose: 0.5 ml    Route: IM    Given by: Cynda Familia ([facility])    Exp. Date: 04/28/2012    Lot #: [id number]    VIS given: 06/25/08 version given May 10, 2010.

## 2010-09-07 NOTE — Progress Notes (Signed)
Summary: Refill/gh  Phone Note Refill Request Message from:  Fax from Pharmacy on Dec 10, 2009 1:41 PM  Refills Requested: Medication #1:  ZOLOFT 50 MG TABS take 1 pill by mouth daily in morning..   Last Refilled: 10/13/2009  Method Requested: Electronic Initial call taken by: Angelina Ok RN,  Dec 10, 2009 1:42 PM  Follow-up for Phone Call       Follow-up by: Jason Coop MD,  Dec 10, 2009 4:15 PM    Prescriptions: ZOLOFT 50 MG TABS (SERTRALINE HCL) take 1 pill by mouth daily in morning.  #30 x 1   Entered and Authorized by:   Jason Coop MD   Signed by:   Jason Coop MD on 12/10/2009   Method used:   Faxed to ...       [facility] (retail)       [address] [place], [place]  [address]       Ph: [phone]       Fax: [phone]   RxID:   [id number]

## 2010-09-07 NOTE — Progress Notes (Signed)
Summary: Refill/gh  Phone Note Refill Request   Refills Requested: Medication #1:  HYDROCODONE-ACETAMINOPHEN 7.5-500 MG TABS Take 1-2  tables by mouth every 6 hours as needed for pain.   Last Refilled: 03/17/2010 Last visit was 03/04/2010.  Hospial discharge was 02/04/2010.   Method Requested: Electronic Initial call taken by: Angelina Ok RN,  April 15, 2010 12:01 PM  Follow-up for Phone Call        Has Oct appt with Dr Eben Burow. At that appt please update pain contract to reflect the #150 that he has been receiving. Thanks Follow-up by: Blanch Media MD,  April 15, 2010 1:44 PM  Additional Follow-up for Phone Call Additional follow up Details #1::        Rx called to pharmacy Additional Follow-up by: Angelina Ok RN,  April 15, 2010 3:46 PM    New/Updated Medications: HYDROCODONE-ACETAMINOPHEN 7.5-500 MG TABS (HYDROCODONE-ACETAMINOPHEN) Take 1-2  tables by mouth every 6 hours as needed for pain. #150 equals a 30 day supply Prescriptions: HYDROCODONE-ACETAMINOPHEN 7.5-500 MG TABS (HYDROCODONE-ACETAMINOPHEN) Take 1-2  tables by mouth every 6 hours as needed for pain. #150 equals a 30 day supply  #150 x 0   Entered and Authorized by:   Blanch Media MD   Signed by:   Blanch Media MD on 04/15/2010   Method used:   Telephoned to ...       [facility] [address] [phone]* (retail)       [address]       [place], [place]  [address]       Ph: [phone] or [phone]       Fax: [phone]   RxID:   [id number]   Appended Document: Refill/gh Rx called in

## 2010-09-07 NOTE — Progress Notes (Signed)
Summary: refill/gg  Phone Note Refill Request  on March 17, 2010 11:17 AM  Refills Requested: Medication #1:  HYDROCODONE-ACETAMINOPHEN 7.5-500 MG TABS Take 1-2  tables by mouth every 6 hours as needed for pain.   Last Refilled: 02/15/2010 call when ready @ [phone]   Method Requested: Fax to Local Pharmacy Initial call taken by: Merrie Roof RN,  March 17, 2010 11:17 AM  Follow-up for Phone Call        Pt had pain contract 10/10 but for #120. I could not find a note for why was increased to #150. But has been getting #150 for past several fills. Will fill #150 and address at next visit.  Last seen in July. Sent flag to Ms Lissa Hoard to get Oct appt Follow-up by: Blanch Media MD,  March 17, 2010 12:23 PM    Prescriptions: HYDROCODONE-ACETAMINOPHEN 7.5-500 MG TABS (HYDROCODONE-ACETAMINOPHEN) Take 1-2  tables by mouth every 6 hours as needed for pain.  #150 x 0   Entered and Authorized by:   Blanch Media MD   Signed by:   Blanch Media MD on 03/17/2010   Method used:   Telephoned to ...       [facility] [address] [phone]* (retail)       [address]       [place], [place]  [address]       Ph: [phone] or [phone]       Fax: [phone]   RxID:   [id number]

## 2010-09-07 NOTE — Progress Notes (Signed)
Summary: refill/ hla  Phone Note Refill Request Message from:  Patient on June 14, 2010 9:14 AM  Refills Requested: Medication #1:  HYDROCODONE-ACETAMINOPHEN 7.5-500 MG TABS Take 1-2  tables by mouth every 6 hours as needed for pain. #150 equals a 30 day supply   Dosage confirmed as above?Dosage Confirmed   Last Refilled: 10/7  Method Requested: Telephone to Pharmacy Initial call taken by: Marin Roberts RN,  June 14, 2010 9:14 AM  Follow-up for Phone Call        Refill approved-nurse to complete Follow-up by: Sinda Du MD,  June 15, 2010 9:51 AM  Additional Follow-up for Phone Call Additional follow up Details #1::        Rx called to pharmacy Additional Follow-up by: Merrie Roof RN,  June 15, 2010 9:58 AM    Prescriptions: HYDROCODONE-ACETAMINOPHEN 7.5-500 MG TABS (HYDROCODONE-ACETAMINOPHEN) Take 1-2  tables by mouth every 6 hours as needed for pain. #150 equals a 30 day supply  #150 x 0   Entered by:   Sinda Du MD   Authorized by:   Merrie Roof RN   Signed by:   Sinda Du MD on 06/15/2010   Method used:   Telephoned to ...       [facility] [address] [phone]* (retail)       [address]       [place], [place]  [address]       Ph: [phone] or [phone]       Fax: [phone]   RxID:   [id number]   Appended Document: refill/ hla THis Rx was authorized by Dr Sinda Du

## 2010-09-07 NOTE — Progress Notes (Signed)
Summary: refill/ hla  Phone Note Refill Request Message from:  Patient on February 15, 2010 10:09 AM  Refills Requested: Medication #1:  HYDROCODONE-ACETAMINOPHEN 7.5-500 MG TABS Take 1-2  tables by mouth every 6 hours as needed for pain.   Dosage confirmed as above?Dosage Confirmed   Last Refilled: 6/10 Initial call taken by: Marin Roberts RN,  February 15, 2010 10:09 AM  Follow-up for Phone Call        Refill approved-nurse to complete. Follow-up by: Margarito Liner MD,  February 15, 2010 10:59 AM  Additional Follow-up for Phone Call Additional follow up Details #1::        Rx faxed to pharmacy Additional Follow-up by: Merrie Roof RN,  February 15, 2010 4:25 PM    Prescriptions: HYDROCODONE-ACETAMINOPHEN 7.5-500 MG TABS (HYDROCODONE-ACETAMINOPHEN) Take 1-2  tables by mouth every 6 hours as needed for pain.  #150 x 0   Entered and Authorized by:   Margarito Liner MD   Signed by:   Margarito Liner MD on 02/15/2010   Method used:   Telephoned to ...       [facility] [address] [phone]* (retail)       [address]       [place], [place]  [address]       Ph: [phone] or [phone]       Fax: [phone]   RxID:   [id number]

## 2010-09-07 NOTE — Assessment & Plan Note (Signed)
Summary: RA/NEEDS 1 MONTH F/U VISIT PER GARG/CH   Vital Signs:  Patient profile:   55 year old male Height:      71.5 inches (181.61 cm) Weight:      168.1 pounds (74.82 kg) BMI:     22.72 Temp:     99.0 degrees F (37.22 degrees C) oral Pulse rate:   81 / minute BP sitting:   137 / 87  (left arm) Cuff size:   regular  Vitals Entered By: Theotis Barrio NT II (June 10, 2010 3:27 PM) CC: PATIENT  STATES HE IS HERE FOR HIS 1 MONTH FOLLOW UP APPT .  Is Patient Diabetic? No Pain Assessment Patient in pain? no      Nutritional Status BMI of 19 -24 = normal  Have you ever been in a relationship where you felt threatened, hurt or afraid?No   Does patient need assistance? Functional Status Self care Ambulation Normal   Primary Care Provider:  Sinda Du MD  CC:  PATIENT  STATES HE IS HERE FOR HIS 1 MONTH FOLLOW UP APPT . 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 Eben Burow. The patient has still not seen Dr. Manson Passey (oral surgeon), but his lip lesion has remained asymptomatic.  Pt was placed on combination hctz and enalapril at his last visit but this is too expensive and he wishes to go back to his hold regimen.  Pt anxiety is well controlled and he is in the process of trying to quit smoking.  Mr. Petrosyan has decreased his smoking from 2ppd to 1.  Pt has continued to not use any alcohol.  Pt was having significant back pain at his last visit, but this has now resolved.  Pt currently denies any cp, sob, change in his BM's or vision.   Preventive Screening-Counseling & Management  Alcohol-Tobacco     Alcohol drinks/day: 0     Alcohol type: spirits     Smoking Status: current     Smoking Cessation Counseling: yes     Packs/Day: 1.0  Caffeine-Diet-Exercise     Does Patient Exercise:  no  Allergies: No Known Drug Allergies  Family History: Reviewed history and no changes required. Father was alcoholic, MI at 85 Mom CAD, skin cancer.   Social History: Reviewed history and no changes required. Lives alone, has no car. Not using alcohol.  Smokes 1 pack daily, does not use drugs. Packs/Day:  1.0  Review of Systems       No fevers, or chills, no change in bm.   Physical Exam  General:  Alert and well-nourished.   Repeat BP- 118/70 Head:  Red macule on left lower lip possible SCC.  Vercuous lesion on left cheek.  Eyes:  vision grossly intact, pupils equal, pupils round, and pupils reactive to light.   Nose:  no nasal discharge.   Mouth:  poor dentition.  1/2 cm red macule consitant with SCC.  Neck:  supple.   Lungs:  normal respiratory effort, normal breath sounds, no crackles, and no wheezes.   Heart:  normal rate, regular rhythm, no murmur, no gallop, and no rub.   Abdomen:  soft, non-tender, and normal bowel sounds.   Msk:  Kyphosis. normal ROM and no joint tenderness.   Pulses:  2+ dp/pt pulses.  Extremities:  No  edema Neurologic:  cranial nerves II-XII intact.   Skin:  See HEENT   Impression & Recommendations:  Problem # 1:  OTHER MALIGNANT NEOPLASM OF SKIN OF LIP (ICD-173.0) Pt was originally to be seen by Dr. Ezzard Standing but plans to now follow up with Dr. Manson Passey for removal of the likely invasive SCC on his lower lip.  Dr. Theora Gianotti office was contacted and an appointment is set for Wed. Nov. 16th, at  3:00 pm.    Problem # 2:  HYPERTENSION, ESSENTIAL NOS (ICD-401.9) Elevated on arrival but improved to goal on recheck.  Will continue treatment with enalapril and hctz.  Pt placed on combination at last visit.  Will change to individual drugs as this is much cheaper.  Will continue to monitor.   His updated medication list for this problem includes:    Metoprolol Tartrate 50 Mg Tabs (Metoprolol tartrate) .Marland Kitchen... Take 1 tablet by mouth two times a day     Enalapril Maleate 10 Mg Tabs (Enalapril maleate) .Marland Kitchen... Take 1 tablet by mouth once a day.    Hydrochlorothiazide 25 Mg Tabs (Hydrochlorothiazide) .Marland Kitchen... Take 1 tablet by mouth once a day  Problem # 3:  ANXIETY (ICD-300.00) Pt doing well now, and anxiety is controled on the bupropion and clonazepam.  Pt has not refilled his zoloft as he feels he does not need it at this time.    His updated medication list for this problem includes:    Clonazepam 1 Mg Tabs (Clonazepam) .Marland Kitchen... Take 1 pill by mouth three times a day    Zoloft 50 Mg Tabs (Sertraline hcl) .Marland Kitchen... Take 1 pill by mouth daily in morning.    Bupropion Hcl 75 Mg Tabs (Bupropion hcl) .Marland Kitchen... Take 1 tablet by mouth two times a day  Problem # 4:  TOBACCO ABUSE (ICD-305.1) Wants to quit but states that for now he just wants to stay on the wellbutrin and plans to buckle down before next visit.  The importance of setting a quit date was discussed with the patient.  The pt does not want to use any nicotine replacement at this time.   Problem # 5:  PREVENTIVE HEALTH CARE (ICD-V70.0)  Pt does not wish to have a colonoscopy at this time but did agree to reconsider at the begining of next year.  Complete Medication List: 1)  Zocor 40 Mg Tabs (Simvastatin) .... Take 1 pill by mouth daily. 2)  Hydrocodone-acetaminophen 7.5-500 Mg Tabs (Hydrocodone-acetaminophen) .... Take 1-2  tables by mouth every 6 hours as needed for pain. #150 equals a 30 day supply 3)  Aspirin 81 Mg Chew (Aspirin) .... Take 1 tablet by mouth once a day 4)  Omeprazole 20 Mg Cpdr (Omeprazole) .... Take 1 tablet by mouth once a day 5)  Metoprolol Tartrate 50 Mg Tabs (Metoprolol tartrate) .... Take 1 tablet by mouth two times a day 6)  Clonazepam 1 Mg Tabs (Clonazepam) .... Take 1 pill by mouth three times a day 7)  Zoloft 50 Mg Tabs (Sertraline hcl) .... Take 1 pill by mouth daily in morning. 8)  Bupropion Hcl 75 Mg Tabs (Bupropion hcl) .... Take 1 tablet by mouth two times a day 9)   Enalapril Maleate 10 Mg Tabs (Enalapril maleate) .... Take 1 tablet by mouth once a day. 10)  B Complex Formula 1 Tabs (Vitamins-lipotropics) .... Take 1 tablet by mouth once a day 11)  Hydrochlorothiazide 25 Mg Tabs (Hydrochlorothiazide) .... Take 1 tablet by mouth once a day  Patient Instructions: 1)  You are scheduled to see Dr. Manson Passey about your lip on  Wed. November 16th at 3:00 pm.  Please do not miss this appointment.  Please continue to try your best to stop smoking and pick a quit date.  Prescriptions: HYDROCHLOROTHIAZIDE 25 MG TABS (HYDROCHLOROTHIAZIDE) Take 1 tablet by mouth once a day  #30 x 3   Entered and Authorized by:   Sinda Du MD   Signed by:   Sinda Du MD on 06/10/2010   Method used:   Handwritten   RxID:   [id number] ENALAPRIL MALEATE 10 MG TABS (ENALAPRIL MALEATE) Take 1 tablet by mouth once a day.  #30 x 3   Entered and Authorized by:   Sinda Du MD   Signed by:   Sinda Du MD on 06/10/2010   Method used:   Print then Give to Patient   RxID:   [id number]    Orders Added: 1)  Est. Patient Level III [[address]]     Prevention & Chronic Care Immunizations   Influenza vaccine: Fluvax 3+  (05/10/2010)   Influenza vaccine deferral: Deferred  (10/26/2009)    Tetanus booster: 05/10/2010: Tdap   Td booster deferral: Deferred  (10/26/2009)    Pneumococcal vaccine: Not documented  Colorectal Screening   Hemoccult: Not documented   Hemoccult action/deferral: Refused  (05/10/2010)    Colonoscopy: Not documented   Colonoscopy action/deferral: Refused  (05/10/2010)  Other Screening   PSA: Not documented   PSA action/deferral: Discussed-decision deferred  (05/10/2010)   Smoking status: current  (06/10/2010)   Smoking cessation counseling: yes  (06/10/2010)  Lipids   Total Cholesterol: 180  (05/10/2010)   Lipid panel action/deferral: Lipid Panel ordered   LDL: 76  (05/10/2010)   LDL Direct: Not documented   HDL: 39  (05/10/2010)    Triglycerides: 326  (05/10/2010)    SGOT (AST): 21  (05/10/2010)   BMP action: Ordered   SGPT (ALT): 12  (05/10/2010)   Alkaline phosphatase: 79  (05/10/2010)   Total bilirubin: 0.5  (05/10/2010)  Hypertension   Last Blood Pressure: 137 / 87  (06/10/2010)   Serum creatinine: 0.89  (05/10/2010)   BMP action: Ordered   Serum potassium 4.7  (05/10/2010)  Self-Management Support :   Personal Goals (by the next clinic visit) :      Personal blood pressure goal: 140/90  (05/25/2009)     Personal LDL goal: 100  (05/10/2010)    Patient will work on the following items until the next clinic visit to reach self-care goals:     Medications and monitoring: take my medicines every day  (06/10/2010)     Eating: eat foods that are low in salt  (06/10/2010)     Activity: take a 30 minute walk every day  (06/10/2010)    Hypertension self-management support: Resources for patients handout  (06/10/2010)    Lipid self-management support: Resources for patients handout  (06/10/2010)        Resource handout printed.

## 2010-09-07 NOTE — Progress Notes (Signed)
Summary: lost pain med/ hla  Phone Note Call from Patient   Caller: Patient Summary of Call: pt calls to say he has "lost" his pain med, doesn't know what could have happened to it and would like a new script for it, realizes it is early but is sure" dr Aleene Davidson will understand...like the last time". do you want to fill this early? Initial call taken by: Marin Roberts RN,  January 07, 2010 11:30 AM  Follow-up for Phone Call        Unfortunately, Andrew [name] has to wait until he is due for next refill.  Follow-up by: Jason Coop MD,  January 07, 2010 12:03 PM

## 2010-09-07 NOTE — Progress Notes (Signed)
Summary: refill/gg  Phone Note Refill Request  on April 02, 2010 2:45 PM  Refills Requested: Medication #1:  ENALAPRIL MALEATE 20 MG TABS Take 1 tablet by mouth once a day   Dosage confirmed as above?Dosage Confirmed   Supply Requested: 6 months   Last Refilled: 03/01/2010  Method Requested: Fax to Local Pharmacy Initial call taken by: Merrie Roof RN,  April 02, 2010 2:45 PM    Prescriptions: ENALAPRIL MALEATE 20 MG TABS (ENALAPRIL MALEATE) Take 1 tablet by mouth once a day  #30 x 5   Entered and Authorized by:   Doneen Poisson MD   Signed by:   Doneen Poisson MD on 04/02/2010   Method used:   Faxed to ...       [facility] Department (retail)       [address] [place], [place]  [address]       Ph: [phone]       Fax: [phone]   RxID:   [id number]

## 2010-09-07 NOTE — Progress Notes (Signed)
Summary: phone/gg  Phone Note Call from Patient   Caller: Patient Summary of Call: Pt received pain meds and asked if you could change the directions to 1 - 2 tabs every 6 hours instead of only 1 tab.   This way he can get # 150 a month. Initial call taken by: Merrie Roof RN,  Dec 16, 2009 12:09 PM  Follow-up for Phone Call        I did.  Follow-up by: Jason Coop MD,  Dec 16, 2009 2:08 PM    New/Updated Medications: HYDROCODONE-ACETAMINOPHEN 7.5-500 MG TABS (HYDROCODONE-ACETAMINOPHEN) Take 1-2  tables by mouth every 6 hours as needed for pain.

## 2010-09-07 NOTE — Progress Notes (Signed)
Summary: refill/gg  Phone Note Refill Request  on August 24, 2009 10:40 AM  Refills Requested: Medication #1:  HYDROCODONE-ACETAMINOPHEN 7.5-500 MG TABS Take 1 tablet by mouth every 6 hours as needed for pain. Refills given till 10/7. Don't refill till then.   Last Refilled: 07/24/2009  Method Requested: Telephone to Pharmacy Initial call taken by: Merrie Roof RN,  August 24, 2009 10:40 AM  Follow-up for Phone Call        Please schedule Mr.Roston for a follow-up visit in the next available, non-overbook appointment with Dr. Elna Breslow. Follow-up by: Doneen Poisson MD,  August 24, 2009 1:55 PM  Additional Follow-up for Phone Call Additional follow up Details #1::        Rx called to pharmacy [place] [facility] [place] to schedule appointment Additional Follow-up by: Merrie Roof RN,  August 24, 2009 3:17 PM    Prescriptions: HYDROCODONE-ACETAMINOPHEN 7.5-500 MG TABS (HYDROCODONE-ACETAMINOPHEN) Take 1 tablet by mouth every 6 hours as needed for pain. Refills given till 10/7. Don't refill till then.  #120 x 0   Entered and Authorized by:   Doneen Poisson MD   Signed by:   Doneen Poisson MD on 08/24/2009   Method used:   Printed then faxed to ...       [facility] [phone]* (retail)       [address]       [place], [place]  [address]       Ph: [phone] or [phone]       Fax: [phone]   RxID:   [id number]   Appended Document: refill/gg appointment scheduled for 2/3 with kDr Pokharel

## 2010-09-07 NOTE — Progress Notes (Signed)
Summary: refill/gg  Phone Note Refill Request  on September 22, 2009 11:36 AM  Refills Requested: Medication #1:  HYDROCODONE-ACETAMINOPHEN 7.5-500 MG TABS Take 1 tablet by mouth every 6 hours as needed for pain. Refills given till 10/7. Don't refill till then.   Last Refilled: 08/24/2009 Thursday is the 17th but pt states # 120 only last 30 days not 31 !!   Method Requested: Telephone to Pharmacy Initial call taken by: Merrie Roof RN,  September 22, 2009 11:36 AM  Follow-up for Phone Call       Follow-up by: Jason Coop MD,  September 22, 2009 7:23 PM    Prescriptions: HYDROCODONE-ACETAMINOPHEN 7.5-500 MG TABS (HYDROCODONE-ACETAMINOPHEN) Take 1 tablet by mouth every 6 hours as needed for pain. Refills given till 10/7. Don't refill till then.  #120 x 2   Entered and Authorized by:   Jason Coop MD   Signed by:   Jason Coop MD on 09/22/2009   Method used:   Telephoned to ...       [facility] [phone]* (retail)       [address]       [place], [place]  [address]       Ph: [phone] or [phone]       Fax: [phone]   RxID:   [id number]   Appended Document: refill/[facility] [address]  Appended Document: [facility]ill[facility]

## 2010-09-07 NOTE — Progress Notes (Signed)
Summary: refill/gg  Phone Note Refill Request  on February 15, 2010 4:51 PM  Refills Requested: Medication #1:  HYDROCHLOROTHIAZIDE 25 MG TABS Take 1 tablet by mouth once a day   Last Refilled: 11/17/2009  Medication #2:  CLONAZEPAM 1 MG  TABS take 1 pill by mouth three times a day   Last Refilled: 01/15/2010  Method Requested: Fax to Local Pharmacy Initial call taken by: Merrie Roof RN,  February 15, 2010 4:51 PM  Follow-up for Phone Call        Refill approved-nurse to complete Follow-up by: Ulyess Mort MD,  February 15, 2010 5:18 PM  Additional Follow-up for Phone Call Additional follow up Details #1::        Rx faxed to pharmacy Additional Follow-up by: Merrie Roof RN,  February 16, 2010 2:49 PM    Prescriptions: CLONAZEPAM 1 MG  TABS (CLONAZEPAM) take 1 pill by mouth three times a day  #90 x 3   Entered and Authorized by:   Ulyess Mort MD   Signed by:   Ulyess Mort MD on 02/15/2010   Method used:   Telephoned to ...       [facility] Department (retail)       [address] [place], [place]  [address]       Ph: [phone]       Fax: [phone]   RxID:   [id number] HYDROCHLOROTHIAZIDE 25 MG TABS (HYDROCHLOROTHIAZIDE) Take 1 tablet by mouth once a day  #30 x 4   Entered and Authorized by:   Ulyess Mort MD   Signed by:   Ulyess Mort MD on 02/15/2010   Method used:   Telephoned to ...       [facility] Department (retail)       [address] [place], [place]  [address]       Ph: [phone]       Fax: [phone]   RxID:   [id number]

## 2010-09-07 NOTE — Progress Notes (Signed)
Summary: refill/ hla  Phone Note Refill Request Message from:  Patient on January 13, 2010 5:27 PM  Refills Requested: Medication #1:  HYDROCODONE-ACETAMINOPHEN 7.5-500 MG TABS Take 1-2  tables by mouth every 6 hours as needed for pain.   Last Refilled: 5/11  Medication #2:  ENALAPRIL MALEATE 20 MG TABS Take 1 tablet by mouth once a day   Last Refilled: 11/19/2009 ***Enalapril needs to be called into [facility]  [facility]in [facility]*********  Initial call taken by: Marin Roberts RN,  January 13, 2010 5:27 PM  Follow-up for Phone Call       Follow-up by: Jason Coop MD,  January 15, 2010 2:54 PM  Additional Follow-up for Phone Call Additional follow up Details #1::        Enalapril called into [facility]. Additional Follow-up by: Merrie Roof RN,  January 15, 2010 2:57 PM    Prescriptions: HYDROCODONE-ACETAMINOPHEN 7.5-500 MG TABS (HYDROCODONE-ACETAMINOPHEN) Take 1-2  tables by mouth every 6 hours as needed for pain.  #150 x 0   Entered and Authorized by:   Jason Coop MD   Signed by:   Jason Coop MD on 01/15/2010   Method used:   Telephoned to ...       [facility] [phone]* (retail)       [address]       [place], [place]  [address]       Ph: [phone] or [phone]       Fax: [phone]   RxID:   [id number] HYDROCHLOROTHIAZIDE 25 MG TABS (HYDROCHLOROTHIAZIDE) Take 1 tablet by mouth once a day  #30 x 0   Entered and Authorized by:   Jason Coop MD   Signed by:   Jason Coop MD on 01/15/2010   Method used:   Telephoned to ...       [facility] [phone]* (retail)       [address]       [place], [place]  [address]       Ph: [phone] or [phone]       Fax: [phone]   RxID:   [id number] ENALAPRIL MALEATE 20 MG TABS (ENALAPRIL MALEATE) Take 1 tablet by mouth once a day  #30 x 0   Entered and Authorized by:   Jason Coop MD   Signed by:   Jason Coop MD on 01/15/2010   Method used:   Telephoned to  ...       [facility] [phone]* (retail)       [address]       [place], [place]  [address]       Ph: [phone] or [phone]       Fax: [phone]   RxID:   [id number]   Appended Document: [facility]ill/ [facility]

## 2010-09-07 NOTE — Discharge Summary (Signed)
Summary: Hospital Discharge Update (CP, ruled out)    Hospital Discharge Update:  Date of Admission: 02/03/2010 Date of Discharge: 02/04/2010  Brief Summary:  CP- ruled out, 2D echo performed. EF- 50% and LVH   Other follow-up issues:  Squamous cell carcinoma of lower lip, needs outpateint surgical referral. PAtient has orange card Any furhter chest pain FLP- one obtained in hospital had very high TG, might not be fasting.  Alcahol cessation counselling   Medication list changes:  Added new medication of FOLIC ACID 1 MG TABS (FOLIC ACID) Take 1 tablet by mouth once a day - Signed Added new medication of THIAMINE HCL 100 MG TABS (THIAMINE HCL) Take 1 tablet by mouth once a day - Signed Rx of FOLIC ACID 1 MG TABS (FOLIC ACID) Take 1 tablet by mouth once a day;  #31 x 6;  Signed;  Entered by: Bethel Born MD;  Authorized by: Bethel Born MD;  Method used: [facility] [address] [phone]*, [address], [place], [place]  [address], Ph: [phone] or [phone], Fax: [phone] Rx of THIAMINE HCL 100 MG TABS (THIAMINE HCL) Take 1 tablet by mouth once a day;  #31 x 3;  Signed;  Entered by: Bethel Born MD;  Authorized by: Bethel Born MD;  Method used: [facility] [phone]*, [address] [place], [place]  [address], Ph: [phone] or [phone], Fax: [phone]  The medication, problem, and allergy lists have been updated.  Please see the dictated discharge summary for details.  Discharge medications:  ZOCOR 40 MG TABS (SIMVASTATIN) take 1 pill by mouth daily. HYDROCODONE-ACETAMINOPHEN 7.5-500 MG TABS (HYDROCODONE-ACETAMINOPHEN) Take 1-2  tables by mouth every 6 hours as needed for pain. ENALAPRIL MALEATE 20 MG TABS (ENALAPRIL MALEATE) Take 1 tablet by mouth once a day HYDROCHLOROTHIAZIDE 25 MG TABS (HYDROCHLOROTHIAZIDE) Take 1 tablet by mouth once a day ASPIRIN 81 MG CHEW (ASPIRIN) Take 1 tablet by mouth once a day OMEPRAZOLE 20  MG CPDR (OMEPRAZOLE) Take 1 tablet by mouth once a day METOPROLOL TARTRATE 50 MG TABS (METOPROLOL TARTRATE) Take 1 tablet by mouth two times a day CLONAZEPAM 1 MG  TABS (CLONAZEPAM) take 1 pill by mouth three times a day ZOLOFT 50 MG TABS (SERTRALINE HCL) take 1 pill by mouth daily in morning. FOLIC ACID 1 MG TABS (FOLIC ACID) Take 1 tablet by mouth once a day THIAMINE HCL 100 MG TABS (THIAMINE HCL) Take 1 tablet by mouth once a day  Other patient instructions:  Follow up appointment in [facility] outpatient clinic in 2-3 weeks   Note: Hospital Discharge Medications & Other Instructions handout was printed, one copy for patient and a second copy to be placed in hospital chart.

## 2010-09-09 NOTE — Progress Notes (Signed)
Summary: refill/gg  Phone Note Refill Request  on August 12, 2010 11:03 AM  Refills Requested: Medication #1:  HYDROCODONE-ACETAMINOPHEN 7.5-500 MG TABS Take 1-2  tables by mouth every 6 hours as needed for pain. #150 equals a 30 day supply   Last Refilled: 07/14/2010 call when ready - [phone]   Method Requested: Telephone to Pharmacy Initial call taken by: Merrie Roof RN,  August 12, 2010 11:03 AM  Follow-up for Phone Call        Does have contract from Oct 2011. last refill Dr Cathey Endow questioned need bc pt had stated pain resolved. Will fill but pls tell pt that he needs to keep next appt in Feb with Dr Cathey Endow to receive additional meds. Follow-up by: Blanch Media MD,  August 12, 2010 2:27 PM    Prescriptions: HYDROCODONE-ACETAMINOPHEN 7.5-500 MG TABS (HYDROCODONE-ACETAMINOPHEN) Take 1-2  tables by mouth every 6 hours as needed for pain. #150 equals a 30 day supply  #150 x 0   Entered and Authorized by:   Blanch Media MD   Signed by:   Blanch Media MD on 08/12/2010   Method used:   Telephoned to ...       [facility] [address] [phone]* (retail)       [address]       [place], [place]  [address]       Ph: [phone] or [phone]       Fax: [phone]   RxID:   [id number]   Appended Document: refill/gg rx called in

## 2010-09-09 NOTE — Progress Notes (Signed)
Summary: med refill/gp  Phone Note Refill Request Message from:  Fax from Pharmacy on August 04, 2010 2:17 PM  Refills Requested: Medication #1:  METOPROLOL TARTRATE 50 MG TABS Take 1 tablet by mouth two times a day   Last Refilled: 06/28/2010 Last appt, 06/10/10.   Method Requested: Fax to Local Pharmacy Initial call taken by: Chinita Pester RN,  August 04, 2010 2:18 PM    Prescriptions: METOPROLOL TARTRATE 50 MG TABS (METOPROLOL TARTRATE) Take 1 tablet by mouth two times a day  #60 x 11   Entered and Authorized by:   Zoila Shutter MD   Signed by:   Zoila Shutter MD on 08/04/2010   Method used:   Faxed to ...       [facility] Department (retail)       [address] [place], [place]  [address]       Ph: [phone]       Fax: [phone]   RxID:   [id number]

## 2010-09-09 NOTE — Progress Notes (Signed)
Summary: refill/ hla  Phone Note Refill Request Message from:  Patient on July 15, 2010 3:31 PM  Refills Requested: Medication #1:  OMEPRAZOLE 20 MG CPDR Take 1 tablet by mouth once a day   Dosage confirmed as above?Dosage Confirmed Last labs 05/10/2010.  Last office vist was 06/10/2010.Angelina Ok RN  July 19, 2010 10:15 AM   Initial call taken by: Marin Roberts RN,  July 15, 2010 3:31 PM  Follow-up for Phone Call        Refilled electronically.  Follow-up by: Margarito Liner MD,  July 19, 2010 10:29 AM    Prescriptions: OMEPRAZOLE 20 MG CPDR (OMEPRAZOLE) Take 1 tablet by mouth once a day  #30 x 3   Entered and Authorized by:   Margarito Liner MD   Signed by:   Margarito Liner MD on 07/19/2010   Method used:   [facility]ronically to        [facility] [phone]* (retail)       [address]       [place], [place]  [address]       Ph: [phone] or [phone]       Fax: [phone]   RxID:   [id number]

## 2010-09-10 ENCOUNTER — Other Ambulatory Visit: Payer: Self-pay | Admitting: *Deleted

## 2010-09-10 MED ORDER — HYDROCODONE-ACETAMINOPHEN 7.5-500 MG PO TABS: 1.0000 | ORAL_TABLET | Freq: Four times a day (QID) | ORAL | Status: DC | PRN

## 2010-09-10 NOTE — Medication Information (Signed)
Summary: CONTROLLED MEDICATION TREATMENT  CONTROLLED MEDICATION TREATMENT   Imported By: Margie Billet 05/12/2010 13:49:39  _____________________________________________________________________  External Attachment:    Type:   Image     Comment:   External Document

## 2010-09-10 NOTE — Telephone Encounter (Signed)
Lat filled 08/12/10. On narc contract.

## 2010-09-10 NOTE — Telephone Encounter (Signed)
Called to pharmacy, printed script destroyed

## 2010-09-15 ENCOUNTER — Encounter: Payer: Self-pay | Admitting: Ophthalmology

## 2010-09-15 ENCOUNTER — Ambulatory Visit (INDEPENDENT_AMBULATORY_CARE_PROVIDER_SITE_OTHER): Payer: Self-pay | Admitting: Ophthalmology

## 2010-09-15 DIAGNOSIS — I10 Essential (primary) hypertension: Secondary | ICD-10-CM

## 2010-09-15 DIAGNOSIS — M549 Dorsalgia, unspecified: Secondary | ICD-10-CM

## 2010-09-15 DIAGNOSIS — C4400 Unspecified malignant neoplasm of skin of lip: Secondary | ICD-10-CM

## 2010-09-15 DIAGNOSIS — Z0000 Encounter for general adult medical examination without abnormal findings: Secondary | ICD-10-CM | POA: Insufficient documentation

## 2010-09-15 MED ORDER — OMEPRAZOLE 20 MG PO CPDR: 20.0000 mg | DELAYED_RELEASE_CAPSULE | Freq: Every day | ORAL | Status: DC

## 2010-09-15 MED ORDER — ASPIRIN 81 MG PO CHEW: 81.0000 mg | CHEWABLE_TABLET | Freq: Every day | ORAL | Status: DC

## 2010-09-15 MED ORDER — SIMVASTATIN 40 MG PO TABS: 40.0000 mg | ORAL_TABLET | Freq: Every day | ORAL | Status: DC

## 2010-09-15 MED ORDER — SERTRALINE HCL 50 MG PO TABS: 50.0000 mg | ORAL_TABLET | ORAL | Status: DC

## 2010-09-15 MED ORDER — METOPROLOL TARTRATE 50 MG PO TABS: 50.0000 mg | ORAL_TABLET | Freq: Two times a day (BID) | ORAL | Status: DC

## 2010-09-15 MED ORDER — ENALAPRIL MALEATE 10 MG PO TABS: 10.0000 mg | ORAL_TABLET | Freq: Every day | ORAL | Status: DC

## 2010-09-15 MED ORDER — HYDROCHLOROTHIAZIDE 25 MG PO TABS: 25.0000 mg | ORAL_TABLET | Freq: Every day | ORAL | Status: DC

## 2010-09-15 NOTE — Assessment & Plan Note (Addendum)
The patient has chronic lumbar/thoracic back pain, he has kyphosis, mild facet hypertrophy at L1-L2, circumferential annular bulging and mild vertebral body osteophytic formation at L2-L3, mild to moderate stenosis of the medial aspect of the neural foramen at L4-L5, and mild annular bulging at L5-S1. The patient continues to have lumbar back pain that's not fully relieved by Vicodin 7.5 500.  The patient's prescription prices have also been drastically increasing over the last several months and had increased from about $25 2 $43 given his continued increased pain and lack of financial resources, I will start the patient on Percocet 5 325. I will still give the patient 150 tablets. The patient just refilled his Vicodin prescription, I instructed him to call back to the clinic when he finishes. I will refill out of narcotic contract with him today.

## 2010-09-15 NOTE — Assessment & Plan Note (Signed)
The patient wishes to defer colonoscopy until he gets his lip treated. I told the patient that we would revisit this at future office visits. The patient is currently up-to-date on both his flu vaccine and his tetanus vaccination.

## 2010-09-15 NOTE — Assessment & Plan Note (Addendum)
The patients BP was under good control today and I am not making any changes to his medication doses.

## 2010-09-15 NOTE — Patient Instructions (Signed)
I would like to see you again in 3 months.  Please call the clinic when you have used your vicodin prescription.  You will be prescribed percocet at that time.  If you have concerns, call the clinic.

## 2010-09-15 NOTE — Assessment & Plan Note (Signed)
[facility] is  Now taking Orange card thanks to the help of IT sales professional. I will refer the patient for ENT evaluation today. I put the order in urgent as the squamous cell carcinoma needs to be removed. I instructed the patient to contact the clinic immediately if he gets any further  Letters of disapproval.

## 2010-09-15 NOTE — Progress Notes (Signed)
  Subjective:    Patient ID: Andrew [name], male    DOB: [birth year], 55 y.o.   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 The primary issue with this patient at this point in time, continues to be a squamous of carcinoma of the lip. The patient has seen Dr. Manson Passey we performed an excisional biopsy of the lower lip on July 21, 2010 which demonstrated squamous cell carcinoma in situ with focally involved margins from  3 clock to 9:00.  It was recommended to the patient, that he followup with  [facility]NT , or go to [facility] for further treatment. The patient has done neither at this point. Since that time, the patients anxiety and depression have been under good control, but he continues to complain of his chronic back pain. Pt states that he is having to take up to 5 tabs of vicoin daily for pain.  The pain is diffuse and is in both the thoracic and lumbar spine and is worsened by movent.    Review of Systems  Constitutional: Negative for fever and chills.  Respiratory: Negative for cough and shortness of breath.   Cardiovascular: Negative for chest pain and palpitations.  Gastrointestinal: Negative for vomiting, diarrhea and constipation.       Objective:   Physical Exam  Constitutional: He appears well-developed and well-nourished.  HENT:  Head: Normocephalic and atraumatic.  Eyes: Pupils are equal, round, and reactive to light.  Cardiovascular: Normal rate, regular rhythm and intact distal pulses.  Exam reveals no gallop and no friction rub.   No murmur heard. Pulmonary/Chest: Effort normal and breath sounds normal. He has no wheezes. He has no rales.  Abdominal: Soft. Bowel sounds are normal. He exhibits no distension. There is no tenderness.  Musculoskeletal: Normal range of motion.  Neurological: He is alert. No  cranial nerve deficit.  Skin: No rash noted.       There is mild erythema of the lower lip but no obvious mass lesion.           Assessment & Plan:

## 2010-09-30 ENCOUNTER — Other Ambulatory Visit: Payer: Self-pay | Admitting: *Deleted

## 2010-09-30 MED ORDER — OXYCODONE-ACETAMINOPHEN 5-325 MG PO TABS: 1.0000 | ORAL_TABLET | Freq: Four times a day (QID) | ORAL | Status: DC | PRN

## 2010-10-01 MED ORDER — OXYCODONE-ACETAMINOPHEN 5-325 MG PO TABS: 1.0000 | ORAL_TABLET | Freq: Four times a day (QID) | ORAL | Status: AC | PRN

## 2010-10-01 NOTE — Telephone Encounter (Signed)
Please write this script out or preferably print it, legally percocet cannot be phoned or faxed in, pt must present the hardcopy.  Thank you, h.

## 2010-10-01 NOTE — Telephone Encounter (Signed)
Will print 

## 2010-10-05 ENCOUNTER — Encounter: Payer: Self-pay | Admitting: Ophthalmology

## 2010-10-18 ENCOUNTER — Encounter (HOSPITAL_COMMUNITY)
Admission: RE | Admit: 2010-10-18 | Discharge: 2010-10-18 | Disposition: A | Discharge status: Home / Self Care | Payer: Self-pay | Source: Ambulatory Visit | Attending: Otolaryngology | Admitting: Otolaryngology

## 2010-10-18 DIAGNOSIS — Z01812 Encounter for preprocedural laboratory examination: Secondary | ICD-10-CM | POA: Insufficient documentation

## 2010-10-18 LAB — SURGICAL PCR SCREEN
MRSA, PCR: NEGATIVE
Staphylococcus aureus: NEGATIVE

## 2010-10-18 LAB — BASIC METABOLIC PANEL
BUN: 9 mg/dL (ref 6–23)
Chloride: 96 mEq/L (ref 96–112)
GFR calc Af Amer: >60 mL/min (ref >60)
GFR calc non Af Amer: >60 mL/min (ref >60)
Potassium: 3.9 mEq/L (ref 3.5–5.1)
Sodium: 134 mEq/L — ABNORMAL LOW (ref 135–145)

## 2010-10-18 LAB — CBC
MCV: 87.2 fL (ref 78.0–100.0)
Platelets: 265 10*3/uL (ref 150–400)
RBC: 4.53 MIL/uL (ref 4.22–5.81)
WBC: 9.8 10*3/uL (ref 4.0–10.5)

## 2010-10-20 ENCOUNTER — Other Ambulatory Visit: Payer: Self-pay | Admitting: Internal Medicine

## 2010-10-20 NOTE — Telephone Encounter (Signed)
Approval called in by me for 2 refills-q 30 day, no early pick up

## 2010-10-21 ENCOUNTER — Other Ambulatory Visit: Payer: Self-pay | Admitting: *Deleted

## 2010-10-21 ENCOUNTER — Ambulatory Visit (HOSPITAL_COMMUNITY)
Admission: RE | Admit: 2010-10-21 | Discharge: 2010-10-21 | Disposition: A | Discharge status: Home / Self Care | Payer: Self-pay | Source: Ambulatory Visit | Attending: Otolaryngology | Admitting: Otolaryngology

## 2010-10-21 ENCOUNTER — Other Ambulatory Visit: Payer: Self-pay | Admitting: Otolaryngology

## 2010-10-21 DIAGNOSIS — F17200 Nicotine dependence, unspecified, uncomplicated: Secondary | ICD-10-CM | POA: Insufficient documentation

## 2010-10-21 DIAGNOSIS — I10 Essential (primary) hypertension: Secondary | ICD-10-CM | POA: Insufficient documentation

## 2010-10-21 DIAGNOSIS — C001 Malignant neoplasm of external lower lip: Secondary | ICD-10-CM | POA: Insufficient documentation

## 2010-10-21 LAB — GLUCOSE, CAPILLARY: Glucose-Capillary: 112 mg/dL — ABNORMAL HIGH (ref 70–99)

## 2010-10-22 MED ORDER — HYDROCODONE-ACETAMINOPHEN 7.5-500 MG PO TABS: ORAL_TABLET | ORAL | Status: DC

## 2010-10-22 NOTE — Telephone Encounter (Signed)
[facility] called and refill of vicodin was cancelled per order Dr Cathey Endow. Pt has scheduled appointment on Wed with Dr Cathey Endow for refill of Percocet.  This is due on 3/23.

## 2010-10-24 LAB — POCT CARDIAC MARKERS
CKMB, poc: <1 ng/mL — ABNORMAL LOW (ref 1.0–8.0)
CKMB, poc: <1 ng/mL — ABNORMAL LOW (ref 1.0–8.0)
Myoglobin, poc: 40.2 ng/mL (ref 12–200)
Myoglobin, poc: 46.1 ng/mL (ref 12–200)

## 2010-10-24 LAB — DIFFERENTIAL
Basophils Relative: 1 % (ref 0–1)
Eosinophils Absolute: 0.3 10*3/uL (ref 0.0–0.7)
Lymphs Abs: 1.8 10*3/uL (ref 0.7–4.0)
Monocytes Absolute: 0.5 10*3/uL (ref 0.1–1.0)
Monocytes Relative: 8 % (ref 3–12)
Neutrophils Relative %: 61 % (ref 43–77)

## 2010-10-24 LAB — LIPID PANEL
Cholesterol: 178 mg/dL (ref 0–200)
HDL: 39 mg/dL — ABNORMAL LOW (ref >39)
LDL Cholesterol: UNDETERMINED mg/dL (ref 0–99)
Total CHOL/HDL Ratio: 4.6 RATIO
VLDL: UNDETERMINED mg/dL (ref 0–40)

## 2010-10-24 LAB — CARDIAC PANEL(CRET KIN+CKTOT+MB+TROPI)
CK, MB: 1.6 ng/mL (ref 0.3–4.0)
Relative Index: INVALID (ref 0.0–2.5)
Total CK: 64 U/L (ref 7–232)

## 2010-10-24 LAB — COMPREHENSIVE METABOLIC PANEL
ALT: 14 U/L (ref 0–53)
ALT: 17 U/L (ref 0–53)
AST: 20 U/L (ref 0–37)
Albumin: 3.2 g/dL — ABNORMAL LOW (ref 3.5–5.2)
Albumin: 3.5 g/dL (ref 3.5–5.2)
Alkaline Phosphatase: 78 U/L (ref 39–117)
Calcium: 8.5 mg/dL (ref 8.4–10.5)
Calcium: 9.1 mg/dL (ref 8.4–10.5)
GFR calc Af Amer: >60 mL/min (ref >60)
GFR calc Af Amer: >60 mL/min (ref >60)
Glucose, Bld: 90 mg/dL (ref 70–99)
Potassium: 3.4 mEq/L — ABNORMAL LOW (ref 3.5–5.1)
Potassium: 3.7 mEq/L (ref 3.5–5.1)
Sodium: 137 mEq/L (ref 135–145)
Sodium: 148 mEq/L — ABNORMAL HIGH (ref 135–145)
Total Protein: 5.7 g/dL — ABNORMAL LOW (ref 6.0–8.3)
Total Protein: 6.2 g/dL (ref 6.0–8.3)

## 2010-10-24 LAB — TROPONIN I: Troponin I: 0.01 ng/mL (ref 0.00–0.06)

## 2010-10-24 LAB — CK TOTAL AND CKMB (NOT AT ARMC)
Relative Index: INVALID (ref 0.0–2.5)
Total CK: 71 U/L (ref 7–232)

## 2010-10-24 LAB — CBC
HCT: 36.3 % — ABNORMAL LOW (ref 39.0–52.0)
Hemoglobin: 12.5 g/dL — ABNORMAL LOW (ref 13.0–17.0)
MCHC: 34.5 g/dL (ref 30.0–36.0)
Platelets: 199 10*3/uL (ref 150–400)
RBC: 4.05 MIL/uL — ABNORMAL LOW (ref 4.22–5.81)
RDW: 14.3 % (ref 11.5–15.5)
WBC: 7 10*3/uL (ref 4.0–10.5)

## 2010-10-24 LAB — ETHANOL: Alcohol, Ethyl (B): 244 mg/dL — ABNORMAL HIGH (ref 0–10)

## 2010-10-24 LAB — HEMOGLOBIN A1C: Mean Plasma Glucose: 111 mg/dL (ref <117)

## 2010-10-27 ENCOUNTER — Encounter: Payer: Self-pay | Admitting: Ophthalmology

## 2010-10-27 ENCOUNTER — Ambulatory Visit (INDEPENDENT_AMBULATORY_CARE_PROVIDER_SITE_OTHER): Payer: Self-pay | Admitting: Ophthalmology

## 2010-10-27 DIAGNOSIS — I10 Essential (primary) hypertension: Secondary | ICD-10-CM

## 2010-10-27 DIAGNOSIS — Z0000 Encounter for general adult medical examination without abnormal findings: Secondary | ICD-10-CM

## 2010-10-27 DIAGNOSIS — M549 Dorsalgia, unspecified: Secondary | ICD-10-CM

## 2010-10-27 DIAGNOSIS — C4400 Unspecified malignant neoplasm of skin of lip: Secondary | ICD-10-CM

## 2010-10-27 MED ORDER — OXYCODONE-ACETAMINOPHEN 5-325 MG PO TABS: 1.0000 | ORAL_TABLET | Freq: Four times a day (QID) | ORAL | Status: DC | PRN

## 2010-10-27 NOTE — Assessment & Plan Note (Signed)
The patients blood pressure was within reasonable control today (BP: 123/83 mmHg ) and I will not make any adjustments to the patients anti-hypertensive regimen. I will continue to monitor and titrate the patients medications as needed at future visits.

## 2010-10-27 NOTE — Assessment & Plan Note (Signed)
This has been excised migraine per ENT, and patient had his stitches out today. There is good healing thus far with mild erythema around the borders. Per the patient's report, the physician was able to remove all of the known squamous cell carcinoma.  The patient was told to followup in 6 months which he plans to do.

## 2010-10-27 NOTE — Progress Notes (Signed)
Subjective:    Patient ID: Andrew [name], male    DOB: 06-12-1956, 55 y.o.   MRN: [id number]  HPI This is a 55 year old male with past medical history significant for invasive squamous of carcinoma of the lower lip , hypertension , chronic lumbar back pain. The patient recently had excision of his lower lip carcinoma by [facility].  The patient had his sutures removed today without any complication.  Because of the patient's pain contract, he was not prescribed any pain medications after his surgery. Because of this, the patient has been taking approximately 5 Percocet daily. The patient now feels that his pain in his lip is improving , but he still has significant pain in his lower and middle back.  At his last visit, I changed the  Patient Vicodin to Percocet using an equal analgesic dose because of price constraints.  The patient actually feels that the Vicodin helped more, but unfortunately he is unable to afford it. The patient states that his back pain is located in the lumbar and middle spine , results in decreased range of motion which affects his activities of daily living. The patient tries to walk during the afternoons and this actually improves his symptoms. The patient has been to physical therapy in the past but states that this has not helped. The patient denies any numbness or weakness in his legs , he denies any loss of control of his bowel or bladder   Review of Systems  Constitutional: Negative for fever and chills.  Respiratory: Negative for cough and shortness of breath.   Cardiovascular: Negative for chest pain and palpitations.  Gastrointestinal: Negative for vomiting, diarrhea and constipation.       Objective:   Physical Exam  Constitutional: He appears well-developed and well-nourished.  HENT:  Head: Normocephalic and atraumatic.  Eyes: Pupils are equal, round, and reactive to light.  Cardiovascular: Normal rate, regular rhythm and intact distal pulses.  Exam  reveals no gallop and no friction rub.   No murmur heard. Pulmonary/Chest: Effort normal and breath sounds normal. He has no wheezes. He has no rales.  Abdominal: Soft. Bowel sounds are normal. He exhibits no distension. There is no tenderness.  Musculoskeletal: Normal range of motion.        The patient has significant thoracic kyphosis, but has not have any spinal process tenderness. The patient has mildly decreased range of motion in complains of back pain with flexion of the spine as well as extension. The patient has normal strength throughout as well as normal sensation. There is no notable significant paraspinal tenderness.  Neurological: He is alert. No cranial nerve deficit.  Skin: No rash noted.        Current Outpatient Prescriptions on File Prior to Visit  Medication Sig Dispense Refill  . aspirin 81 MG chewable tablet Chew 1 tablet (81 mg total) by mouth daily.  30 tablet  6  . buPROPion (WELLBUTRIN) 75 MG tablet Take 75 mg by mouth 2 (two) times daily.        . clonazePAM (KLONOPIN) 1 MG tablet TAKE 1 TABLET BY MOUTH THREE TIMES DAILY  90 tablet  3  . enalapril (VASOTEC) 10 MG tablet Take 1 tablet (10 mg total) by mouth daily.  30 tablet  6  . hydrochlorothiazide 25 MG tablet Take 1 tablet (25 mg total) by mouth daily.  30 tablet  6  . HYDROcodone-acetaminophen (LORTAB 7.5) 7.5-500 MG per tablet Take 1 to 2 tablets by mouth every 6  hours as needed for pain  150 tablet  0  . metoprolol (LOPRESSOR) 50 MG tablet Take 1 tablet (50 mg total) by mouth 2 (two) times daily.  60 tablet  6  . omeprazole (PRILOSEC) 20 MG capsule Take 1 capsule (20 mg total) by mouth daily.  30 capsule  6  . sertraline (ZOLOFT) 50 MG tablet Take 1 tablet (50 mg total) by mouth every morning.  30 tablet  6  . simvastatin (ZOCOR) 40 MG tablet Take 1 tablet (40 mg total) by mouth daily.  30 tablet  6  . Vitamins-Lipotropics (B COMPLEX FORMULA 1 PO) Take 1 tablet by mouth daily.          Past Medical History   Diagnosis Date  . Tobacco abuse   . 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 Manson Passey).  Pt will likely require a wide resection and  has been refered to ENT (2/12)  . Dental caries   . Glucose intolerance (impaired glucose tolerance)     Max random CBG 130 (08/29/07) typically about 100. A1C in 11/08 was 5.4.  . Peripheral vascular disease   . Hypertension, essential   . Dyslipidemia   . Anxiety   . Thoracic kyphosis   . Back pain      The patient has chronic lumbar/thoracic back pain, he has kyphosis, mild facet hypertrophy at L1-L2, circumferential annular bulging and mild vertebral body osteophytic formation at L2-L3, mild to moderate stenosis of the medial aspect of the neural foramen at L4-L5, and mild annular bulging at L5-S1.  Marland Kitchen History of alcohol abuse      Assessment & Plan:

## 2010-10-27 NOTE — Assessment & Plan Note (Signed)
The patient is up-to-date on both his flu shot and tetanus vaccination, I did discuss with him referral for colonoscopy today which she declined at this time. The patient requests that we discuss this at his next visit

## 2010-10-27 NOTE — Patient Instructions (Signed)
Please use your medications as prescribed, and call us if your pain is not controlled. Will not be able to grant early refills in the future so it's important that we stay in close communication. I would like to see you again in 2 months to followup on her general medical problems. Please call sooner if you have other problems.

## 2010-10-27 NOTE — Assessment & Plan Note (Addendum)
This is chronic, and the patient's symptoms are improved with narcotic medications. Because he continues to have pain, I did plan to increase the patient's Percocet to 7.5-325 today,  but he was unable to afford higher doses. I could potentially  increase the number dispensed, however I am uncomfortable with this. I did inform the patient that he was requesting a refill early at this time which I would grade him because of his recent surgery , but that I would not grant early refills in the future it is important that he is sure that his narcotic medications lasting the entire month. The patient's pain is not surprising giving his MRI findings as well as significant kyphotic disease.  In the future, given the price restraints,  I may consider the addition a plain oxycodone as it is cheaper than higher doses Percocet or Vicodin.

## 2010-10-28 ENCOUNTER — Telehealth: Payer: Self-pay | Admitting: *Deleted

## 2010-10-28 NOTE — Telephone Encounter (Addendum)
Call from pt stating that the op pharm would not fill script for roxicet for the #150 as written, i called the pharm and the tech i spoke w/ stated that because of using the orange card they could only give him what the script's directions called for which was #120. The pharmacist states he did call this am but wouldn't have been able to give the pt the other 30. Soooooo... Dr Cathey Endow will need to write the next script on 4/13 for #30 to be filled on that day w/ directions of 1 tab every 4 to 6 hrs and  no more than 5 tabs daily. The next months script is due on 4/21 and will be given with do not fill until 4/21 on the body of the script. The pharmacist instructed me on how the script should be written.

## 2010-10-28 NOTE — Telephone Encounter (Signed)
Brett Canales, pharmacist, called to  inform you that pt has an orange card and he can only receive a 30 day supply of his percocet. It was written 1 every 6 hours so he should get # 120 a month   Not # 150  Pt was given # 120 [phone] for questions

## 2010-11-03 ENCOUNTER — Other Ambulatory Visit: Payer: Self-pay | Admitting: Ophthalmology

## 2010-11-03 MED ORDER — OXYCODONE-ACETAMINOPHEN 5-325 MG PO TABS: 1.0000 | ORAL_TABLET | ORAL | Status: DC | PRN

## 2010-11-16 ENCOUNTER — Telehealth: Payer: Self-pay | Admitting: *Deleted

## 2010-11-16 ENCOUNTER — Ambulatory Visit: Payer: Self-pay | Admitting: Ophthalmology

## 2010-11-16 NOTE — Telephone Encounter (Signed)
Pt states he is out of pain med, ran out Sunday, he now has diarrhea, abd pain and general malaise. He would like his scripts today. Referring back to previous note, pt was to pick up new script 4/13. An appt is made for him w/ dr Cathey Endow at 1445. Spoke w/ dr Cathey Endow and he agrees. Pt is called and ask to come in at 1445, states he does not know if he can but will come to get his script, it is stressed to get script he must be seen today.

## 2010-11-19 ENCOUNTER — Ambulatory Visit (INDEPENDENT_AMBULATORY_CARE_PROVIDER_SITE_OTHER): Payer: Self-pay | Admitting: Ophthalmology

## 2010-11-19 VITALS — BP 142/86 | HR 83 | Temp 98.6°F | Ht 72.0 in | Wt 167.3 lb

## 2010-11-19 DIAGNOSIS — M549 Dorsalgia, unspecified: Secondary | ICD-10-CM

## 2010-11-19 MED ORDER — HYDROCODONE-ACETAMINOPHEN 10-325 MG PO TABS: 1.0000 | ORAL_TABLET | ORAL | Status: DC | PRN

## 2010-11-19 NOTE — Patient Instructions (Signed)
I am increasing your pain medicine to hydrocodone/acetaminophen 10/ 325 daily. I will prescribe you 150 tablets per month, and will not refill the medication early. Please call if you note the ear using her medications more than every 4 hours.

## 2010-11-19 NOTE — Assessment & Plan Note (Addendum)
This has continued to be under poor control since changing from Vicodin to Percocet. I plan to increase his Percocet today, however am unable to do this because of financial constraints regarding what is available through the [facility] card. As such, I have no choice but to prescribe vicodin 10 mg-325 every 4 hours. I will refill out a narcotic contract today and I elicit we informed the patient would not refill his prescription early. I will check a UDS today as the patient said she's had no narcotics and Sunday. If the patient continues to have worsening chronic back pain, I recommend referral to a pain specialist. I had written the patient for a prescription for 30 Vicodin to last him the remainder of the month, which I have canceled and inform the patient that he should not request.

## 2010-11-19 NOTE — Progress Notes (Signed)
Subjective:    Patient ID: Andrew [name], male    DOB: July 19, 1956, 55 y.o.   MRN: [id number]  Back Pain Pertinent negatives include no chest pain or fever.   This is a 55 year old male with past medical history significant for hypertension, squamous of carcinoma of the lip, and chronic back pain who returns to discuss continued uncontrolled back pain. Several months ago, the patient was changed from Vicodin to Percocet because he was no longer able to afford the Vicodin. The patient brought in his prescriptions, and I called confirm the prices with the pharmacy, and his current dosages, the patient's medications were causing him approximately $40 per month. Percocet can be purchased for $6 monthly. Unfortunately, the patient has had poor control of his pain since that time, continues to have pain in both his thoracic and lumbar spine, without radiation to the leg, urinary retention, or change in his bowel movements. The patient has called on multiple occasions stating that he ran out of his medications early.  The patient also endorses a 2 day history of diarrhea, and chills, no associated with nausea or vomiting that occurred on Monday and Tuesday of this week which she relates to either a viral gastroenteritis or opiate withdrawal because he ran out of his medications. These symptoms have now improved. Of note, the patient states that his last use of narcotics was on Sunday of this week, and he has had no opiates since.   Review of Systems  Constitutional: Negative for fever and chills.  Respiratory: Negative for cough and shortness of breath.   Cardiovascular: Negative for chest pain and palpitations.  Gastrointestinal: Negative for vomiting, diarrhea and constipation.  Musculoskeletal: Positive for back pain.       Objective:   Physical Exam  Constitutional: He appears well-developed and well-nourished.  HENT:  Head: Normocephalic and atraumatic.  Eyes: Pupils are equal, round, and  reactive to light.  Cardiovascular: Normal rate, regular rhythm and intact distal pulses.  Exam reveals no gallop and no friction rub.   No murmur heard. Pulmonary/Chest: Effort normal and breath sounds normal. He has no wheezes. He has no rales.  Abdominal: Soft. Bowel sounds are normal. He exhibits no distension. There is no tenderness.  Musculoskeletal: Normal range of motion.       The patient has moderate decreased range of motion of his lumbar and thoracic spine, pain to palpation of the paraspinal muscles, and significant kyphosis which he's had chronically. There is no specific point tenderness over the spinous processes. Patient walks with an analgesic gait.  Neurological: He is alert. No cranial nerve deficit.  Skin: No rash noted.       Past Medical History  Diagnosis Date  . Tobacco abuse   . 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 Manson Passey).  Pt will likely require a wide resection and  has been refered to ENT (2/12)  . Dental caries   . Glucose intolerance (impaired glucose tolerance)     Max random CBG 130 (08/29/07) typically about 100. A1C in 11/08 was 5.4.  . Peripheral vascular disease   . Hypertension, essential   . Dyslipidemia   . Anxiety   . Thoracic kyphosis   . Back pain      The patient has chronic lumbar/thoracic back pain, he has kyphosis, mild facet hypertrophy at L1-L2, circumferential annular bulging and mild vertebral body osteophytic formation at  L2-L3, mild to moderate stenosis of the medial aspect of the neural foramen at L4-L5, and mild annular bulging at L5-S1.  Marland Kitchen History of alcohol abuse      Assessment & Plan:

## 2010-11-20 LAB — DRUGS OF ABUSE SCREEN W/O ALC, ROUTINE URINE
Amphetamine Screen, Ur: NEGATIVE
Benzodiazepines.: POSITIVE — AB
Creatinine,U: 68.7 mg/dL
Marijuana Metabolite: NEGATIVE
Methadone: NEGATIVE
Opiate Screen, Urine: NEGATIVE
Propoxyphene: NEGATIVE

## 2010-11-24 LAB — BENZODIAZEPINE, QUANTITATIVE, URINE
Alprazolam (GC/LC/MS), ur confirm: NEGATIVE NG/ML
Flurazepam Metabolite: NEGATIVE NG/ML
Lorazepam: NEGATIVE NG/ML
Oxazepam GC/MS Conf: NEGATIVE NG/ML

## 2010-12-02 NOTE — Op Note (Signed)
  [name], Andrew [name]               ACCOUNT NO.:  [id number]  MEDICAL RECORD NO.:  [id number]           PATIENT TYPE:  O  LOCATION:  [facility]                         FACILITY:  [facility]  PHYSICIAN:  Suzanna Obey, M.D.       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  POSTOPERATIVE DIAGNOSIS:  Squamous cell carcinoma of the left lower lip.  SURGICAL PROCEDURE:  Wedge lip resection.  ANESTHESIA:  General.  ESTIMATED BLOOD LOSS:  Less than 5 mL.  INDICATIONS:  This is a 55 year old previously had a lesion removed from his lower lip by oral surgery, squamous cell carcinoma and there was some positive margin issue.  The lesion has not really had much recurrence as far as actual exophytic area, but there was some slightly irregular mucosa right at the vermilion border.  This was to be resected again.  The patient was informed the risk and benefits of the procedure and options were discussed.  All questions were answered and consent was obtained.  He already had numbness of the left lower lip from the previous excision.  OPERATION:  The patient was taken to the operating room and placed in supine position.  After general endotracheal tube anesthesia, an incision was outlined with about a 5-mm margin on each side of the irritated area of the mucosa.  The excision was extended down into the lower lip and to the skin area in wedge fashion.  The cuts were made on each side following the mark and created a wedge-type resection specimen.  The left edge was marked with suture.  Once removed, there was good hemostasis.  The vermilion border was put back together right at its alignment and then the deep was closed with interrupted 4-0 chromic and the mucosal side closed with interrupted 4-0 chromic and  the skin with a running 5-0 nylon.  The patient was then awakened and brought to the recovery in stable condition.  Counts correct.          ______________________________ Suzanna Obey, M.D.     JB/MEDQ  D:  10/21/2010  T:  10/22/2010  Job:  [id number]  Electronically Signed by Suzanna Obey M.D. on 12/02/2010 09:29:32 AM

## 2010-12-15 ENCOUNTER — Ambulatory Visit (INDEPENDENT_AMBULATORY_CARE_PROVIDER_SITE_OTHER): Payer: Self-pay | Admitting: Ophthalmology

## 2010-12-15 ENCOUNTER — Encounter: Payer: Self-pay | Admitting: Ophthalmology

## 2010-12-15 DIAGNOSIS — I10 Essential (primary) hypertension: Secondary | ICD-10-CM

## 2010-12-15 DIAGNOSIS — M549 Dorsalgia, unspecified: Secondary | ICD-10-CM

## 2010-12-15 DIAGNOSIS — F17200 Nicotine dependence, unspecified, uncomplicated: Secondary | ICD-10-CM

## 2010-12-15 DIAGNOSIS — C4400 Unspecified malignant neoplasm of skin of lip: Secondary | ICD-10-CM

## 2010-12-15 MED ORDER — CLONAZEPAM 1 MG PO TABS: 1.0000 mg | ORAL_TABLET | Freq: Three times a day (TID) | ORAL | Status: DC | PRN

## 2010-12-15 MED ORDER — HYDROCODONE-ACETAMINOPHEN 10-325 MG PO TABS: 1.0000 | ORAL_TABLET | ORAL | Status: DC | PRN

## 2010-12-15 NOTE — Progress Notes (Signed)
Subjective:    Patient ID: Andrew [name], male    DOB: 12/24/1955, 55 y.o.   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 The patient was recently seen by me for reevaluation of his narcotic contract his Vicodin was increased to 10-325 every 4 hours. This was a change from the previous dose of Percocet which was not providing him pain relief. At this point in time, the patient states that his back pain is improved and he is not complaining of any back pain today. The patient denies any weakness or numbness as well as any saddle anesthesia or other concerning symptoms. The patient has no other concerns today but wishes to get a refill of his controlled substances and is not have to call in on Friday. The patient denies any recent chest pain, shortness of breath, fevers or chills. The patient is no longer taking his Wellbutrin as it was not helping him stop smoking, but he does state that he plans to stop on Mother's Day.  Review of Systems  Constitutional: Negative for fever and chills.  Respiratory: Negative for cough and shortness of breath.   Cardiovascular: Negative for chest pain and palpitations.  Gastrointestinal: Negative for vomiting, diarrhea and constipation.       Objective:   Physical Exam  Constitutional: He appears well-developed and well-nourished.  HENT:  Head: Normocephalic and atraumatic.  Eyes: Pupils are equal, round, and reactive to light.  Cardiovascular: Normal rate, regular rhythm and intact distal pulses.  Exam reveals no gallop and no friction rub.   No murmur heard. Pulmonary/Chest: Effort normal and breath sounds normal. He has no wheezes. He has no rales.  Abdominal: Soft. Bowel sounds are normal. He exhibits no distension. There is no tenderness.  Musculoskeletal: Normal  range of motion.       There is stable thoracic kyphosis.  Neurological: He is alert. No cranial nerve deficit.  Skin: No rash noted.       Current Outpatient Prescriptions on File Prior to Visit  Medication Sig Dispense Refill  . aspirin 81 MG chewable tablet Chew 1 tablet (81 mg total) by mouth daily.  30 tablet  6  . enalapril (VASOTEC) 10 MG tablet Take 1 tablet (10 mg total) by mouth daily.  30 tablet  6  . hydrochlorothiazide 25 MG tablet Take 1 tablet (25 mg total) by mouth daily.  30 tablet  6  . metoprolol (LOPRESSOR) 50 MG tablet Take 1 tablet (50 mg total) by mouth 2 (two) times daily.  60 tablet  6  . omeprazole (PRILOSEC) 20 MG capsule Take 1 capsule (20 mg total) by mouth daily.  30 capsule  6  . sertraline (ZOLOFT) 50 MG tablet Take 1 tablet (50 mg total) by mouth every morning.  30 tablet  6  . simvastatin (ZOCOR) 40 MG tablet Take 1 tablet (40 mg total) by mouth daily.  30 tablet  6  . Vitamins-Lipotropics (B COMPLEX FORMULA 1 PO) Take 1 tablet by mouth daily.        Marland Kitchen DISCONTD: buPROPion (WELLBUTRIN) 75 MG tablet Take 75 mg by mouth 2 (two) times daily.        Marland Kitchen DISCONTD: clonazePAM (KLONOPIN) 1 MG tablet TAKE 1 TABLET BY MOUTH THREE TIMES DAILY  90 tablet  3  . DISCONTD: HYDROcodone-acetaminophen (NORCO) 10-325  MG per tablet Take 1 tablet by mouth every 4 (four) hours as needed.  150 tablet  0    Past Medical History  Diagnosis Date  . Tobacco abuse   . 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 Manson Passey).  Pt will likely require a wide resection and  has been refered to ENT (2/12)  . Dental caries   . Glucose intolerance (impaired glucose tolerance)     Max random CBG 130 (08/29/07) typically about 100. A1C in 11/08 was 5.4.  . Peripheral vascular disease   . Hypertension, essential   . Dyslipidemia   . Anxiety   . Thoracic kyphosis   . Back pain      The patient has chronic  lumbar/thoracic back pain, he has kyphosis, mild facet hypertrophy at L1-L2, circumferential annular bulging and mild vertebral body osteophytic formation at L2-L3, mild to moderate stenosis of the medial aspect of the neural foramen at L4-L5, and mild annular bulging at L5-S1.  Marland Kitchen History of alcohol abuse       Assessment & Plan:

## 2010-12-15 NOTE — Assessment & Plan Note (Signed)
This is stable and secondary to significant kyphotic changes of the thoracic spine. Patient also had lumbar disease. I'll continue the patient's current dose of Vicodin. The patient is very motivated and is compliant to his narcotic contract well now that we have changed his medications.

## 2010-12-15 NOTE — Assessment & Plan Note (Signed)
This is resolved, the patient is scheduled to followup with [facility] in a few months. I did speak with dreams radiating to the received the patient's records which state that the patient had negative margins after his surgery. the patient's surgical wound has healed quite well.

## 2010-12-15 NOTE — Assessment & Plan Note (Signed)
The patient continues to smoke, but has previously quit for 3 years. The patient plans to stop smoking on Mother's Day. This should be encouraged at future visits. Patient does not wish for any medication intervention at this time.

## 2010-12-15 NOTE — Assessment & Plan Note (Signed)
The patients blood pressure was within reasonable control today (BP: 135/92 mmHg ) and I will not make any adjustments to the patients anti-hypertensive regimen. I will continue to monitor and titrate the patients medications as needed at future visits.

## 2010-12-15 NOTE — Patient Instructions (Signed)
Please continue to take your medications as prescribed. I will refill your pain medication today so you will not have to call in for your prescription. Keep in mind however that this should last year until the end of next month. I would like you to return in 4 months to meet your new primary care doctor.

## 2010-12-21 NOTE — Procedures (Signed)
BYPASS GRAFT EVALUATION   INDICATION:  Follow-up left lower extremity bypass graft.   HISTORY:  Diabetes:  Yes.  Cardiac:  No.  Hypertension:  Yes.  Smoking:  Yes.  Previous Surgery:  Left femoral to peroneal artery bypass graft on  07/03/2002, aortobifem bypass graft on 01/23/2002.   SINGLE LEVEL ARTERIAL EXAM                               RIGHT              LEFT  Brachial:                    128                119  Anterior tibial:             77                 131  Posterior tibial:            78                 134  Peroneal:  Ankle/brachial index:        0.61               1.05   PREVIOUS ABI:  Date: 01/21/2008  RIGHT:  0.43  LEFT:  0.92   LOWER EXTREMITY BYPASS GRAFT DUPLEX EXAM:   DUPLEX:  1. Biphasic Doppler waveforms noted throughout the right lower      extremity bypass graft and natives vessels with no focal increase      in Doppler velocities noted.  2. Increased velocity of 302 cm/s noted in the left proximal profunda      femoral artery.   IMPRESSION:  1. Patent left femoral-to-peroneal artery bypass graft with no      evidence of stenosis.  2. Stable bilateral ankle brachial indices noted when compared to the      previous exams.   ___________________________________________  P. Liliane Bade, M.D.   CH/MEDQ  D:  09/15/2008  T:  09/15/2008  Job:  [id number]

## 2010-12-21 NOTE — Procedures (Signed)
BYPASS GRAFT EVALUATION   INDICATION:  Followup of BPG.  Patient complains of right lower  extremity claudication at one block.  Patient denies bilateral rest  pain.   HISTORY:  Diabetes:  Yes, orally controlled.  Cardiac:  No.  Hypertension:  Yes.  Smoking:  No.  Previous Surgery:  Left fem-peroneal artery BPG with nonreversed  saphenous vein on 07/03/02, AFBG on 01/23/02, both by Dr. Madilyn Fireman.   SINGLE LEVEL ARTERIAL EXAM                               RIGHT              LEFT  Brachial:                    118                114  Anterior tibial:             68                 Inaudible  Posterior tibial:            56                 98  Peroneal:                                       115  Ankle/brachial index:        0.58               0.97   PREVIOUS ABI:  Date: 01/08/07  RIGHT:  0.63  LEFT:  >1.0   LOWER EXTREMITY BYPASS GRAFT DUPLEX EXAM:   DUPLEX:  1. Patent CFA inflow without evidence of stenosis.  2. Patent left fem-peroneal artery BPG without evidence of stenosis      with velocities ranging from 44 cm/s to 79 cm/s.   IMPRESSION:  1. Stable ankle brachial indices bilaterally.  2. Patent left femoral-peroneal artery bypass graft without evidence      of stenosis throughout.   ___________________________________________  P. Liliane Bade, M.D.   PB/MEDQ  D:  07/23/2007  T:  07/24/2007  Job:  [id number]

## 2010-12-21 NOTE — Procedures (Signed)
BYPASS GRAFT EVALUATION   INDICATION:  Followup, left lower extremity bypass graft.  Patient  states pain is consistent from previous study.   HISTORY:  Diabetes:  Yes.  Cardiac:  No.  Hypertension:  Yes  smoking:  No.  Previous Surgery:  Left femoral-peroneal artery bypass graft with  nonreversed saphenous vein on 07/03/02, AFBG on 01/23/02 by Dr. Madilyn Fireman.   SINGLE LEVEL ARTERIAL EXAM                               RIGHT              LEFT  Brachial:                    124                130  Anterior tibial:             55                 105  Posterior tibial:            56                 106  Peroneal:                                       120  Ankle/brachial index:        0.43               0.92   PREVIOUS ABI:  Date: 07/23/07  RIGHT:  0.58  LEFT:  0.97   LOWER EXTREMITY BYPASS GRAFT DUPLEX EXAM:   DUPLEX:  Doppler arterial waveforms appear biphasic proximal to, within,  and distal to the bypass graft.   IMPRESSION:  1. Patent femoral-peroneal artery bypass graft.  2. Right ankle brachial index shows a decrease from previous study.  3. Left ankle brachial index appears fairly stable.   ___________________________________________  P. Liliane Bade, M.D.   AS/MEDQ  D:  01/21/2008  T:  01/21/2008  Job:  [id number]

## 2010-12-21 NOTE — Procedures (Signed)
BYPASS GRAFT EVALUATION   INDICATION:  Followup left lower extremity bypass graft.   HISTORY:  Diabetes:  Yes.  Cardiac:  No.  Hypertension:  Yes.  Smoking:  Yes.  Previous Surgery:  Left femoral-peroneal artery bypass graft 07/03/2002.  Aortobifemoral bypass graft 01/23/2002.  Both by Dr. Madilyn Fireman.   SINGLE LEVEL ARTERIAL EXAM                               RIGHT              LEFT  Brachial:                    128                118  Anterior tibial:             74                 126  Posterior tibial:            79                 129  Peroneal:                                       131  Ankle/brachial index:        0.62               1.02   PREVIOUS ABI:  Date:  09/15/2008  RIGHT:  0.61  LEFT:  1.05   LOWER EXTREMITY BYPASS GRAFT DUPLEX EXAM:   DUPLEX:  Biphasic Doppler waveforms proximal to, within and distal to  the left femoral-peroneal artery bypass graft.  Known elevated velocities in left common femoral artery and profunda  artery distal to the proximal anastomosis of the femoral-peroneal artery  bypass graft.   IMPRESSION:  1. Patent left femoral-peroneal artery bypass graft.  2. Stable ankle brachial indices bilaterally.   ___________________________________________  Di Kindle. Edilia Bo, M.D.   AS/MEDQ  D:  07/16/2009  T:  07/16/2009  Job:  [id number]

## 2010-12-21 NOTE — Group Therapy Note (Signed)
Consult requested by Dr. Jason Coop, MD at the [facility].   Consulted requested for the evaluation of back pain.   CHIEF COMPLAINT:  Mid to low back pain, no lower extremity pain.   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 He has had back pain pretty much since that time.  He  has had no particularly exacerbations lately.  Has had no trauma lately.  In 2002 he did undergo some evaluation for leg pain and had MRI of the  lumbar spine showing foraminal narrowing L2-3 facet arthropathy and  foraminal narrowing L3/4.  He went to neurosurgery.  Did not feel he had  any operative lesions. He did have epidural injections.  This was done  by radiology the first nonselective epidural was at L3/4 level was not  helpful and he had a left L3 selective transforaminal which was helpful  and repeated at L3 level again.  He had another MRI done about 3 months  later bi foraminal stenosis noted L2/3, L3/4, L4/5.  He had an MRI of  the thoracic spine showing multilevel degenerative disk T7/8, T8/9,  T9/10, T10/11, T11/12, T12/L1 but the cord showed no evidence of  narrowing.  In addition he had hip MRI done 09/17/01, had some increased  signal intensity left sacrum near the SI joint.  The hip joint itself  looked okay, some troch bursitis noted on the right.  He also had workup  for peripheral artery disease.  Dr. Madilyn Fireman had followed up with that.  He  had vascular studies and he has demonstrated aortal iliac disease.  He  underwent aortal iliac bypass 01/23/02 as well as right femoral  endarterectomy. The patient did quit smoking.  He underwent left femoral  perineal bypass as well as debridement of the left foot and right leg on  07/03/02.  His latest bypass graft evaluation showed ABI of .58 on  the  right and .97 on the left.  Stable compared to prior on 01/08/07.   Other past hospitalizations include small bowel obstruction resolved  after conservative treatment.   FAMILY HISTORY:  Mother had coronary artery disease.  Father died at age  43 of a stroke.   FUNCTIONAL HISTORY:  Independent with all self care mobility.  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   Can walk 30-45 minutes at a time.  He walks in the mall for exercise.  He is able to climb steps.  He is able to drive.  When he gets right leg  pain this improves with standing still.  His main review of systems is  negative.  He describes this pain as sharp and aching.  Rated as an 8/10  on average but only 3/10.  The sleep is good.   HABITS:  Denies smoking, alcohol use or illegal drug use.   PHYSICAL EXAMINATION:  VITALS:  Blood pressure 143/72, pulse 70,  respiratory rate is 18, O2 sat 99% on room air.  GENERAL:  No acute distress.  Orientation x3.  Affect is alert, gait is  normal.  BACK:  Has no tenderness to palpation.  He does have a dextroconvexed  thoracic scoliosis as well as a kyphosis.  EXTREMITIES:  His upper extremity strength if 5/5 in deltoid  biceps/triceps grip as well as hip flexor, knee extension, ankle  dorsiflexion and plantar flexion.  He has normal internal and external  rotation of the shoulders.  Normal elbow, wrist and hand range of motion  normal.  Lower extremity range of motion including hips, knees and  ankles.   Faber's test is negative. He has normal deep tendon reflexes bilateral  biceps, triceps, brachial radialis, knee and ankle.  His mood and affect  are appropriate.   His sensation is normal in upper and lower extremities.   IMPRESSION:  Chronic back pain.  Appears to have a thoracic scoliosis  and his mid back pain may be related to this including some history of  upper lumbar spondylosis  and facet arthropathy.   Overall he does not appear  to have any radicular symptoms at this time.  Overall his level of function is good.  We discussed his medication  regimen which currently includes:  Hydrocodone 7.5/500 q.i.d.  He states  that this does help his pain, allows him to function.  There are  occasions where he thinks he needs to take two hydrocodone.  He does  complain of the expensive medication which for him is around $21 a  month.  We discussed other non-narcotic alternatives such as tramadol  and that at the [facility] this would be available for $4 a month.  He would like to try this.   As part of our routine evaluation we asked to do a urine drug screen.  He stated that he was unable to void and we allowed him to wait and I  gave him some fluids to drink.  However, the patient left without giving  a sample.  he did not get his prescription either.  It is unclear  whether he will follow-up with [place].      Erick Colace, M.D.  Electronically Signed     AEK/MedQ  D:  09/10/2007 10:29:39  T:  09/10/2007 11:37:25  Job #:  [id number]   cc:   Jason Coop, MD  Fax: [phone]

## 2010-12-24 NOTE — Discharge Summary (Signed)
NAME:  Andrew [name], Andrew [name]                         ACCOUNT NO.:  [id number]   MEDICAL RECORD NO.:  [id number]                   PATIENT TYPE:  INP   LOCATION:  5735                                 FACILITY:  [facility]   PHYSICIAN:  Douglass Rivers, M.D.                DATE OF BIRTH:  [birth year]   DATE OF ADMISSION:  10/30/2002  DATE OF DISCHARGE:  11/05/2002                                 DISCHARGE SUMMARY   DISCHARGE DIAGNOSES:  1. Small bowel obstruction, resolved with conservative management.  2. Nausea and vomiting secondary to #1.  3. Dehydration.  4. Metabolic alkalosis.  5. Status post hypokalemia.  6. Peripheral vascular disease, status post aortobifemoral bypass (6/03) and     left femoral to peroneal bypass (12/03).  7. Diabetes mellitus, type 2.  8. Hypertension.  9. Anxiety.  10.      History of pelvic fracture.  11.      Left foot ulcer.   CONSULTATIONS:  Wound care.   DISCHARGE MEDICATIONS:  1. Vasotec 10 mg p.o. daily.  2. Hydrochlorothiazide 25 mg/triamterine 37.5 mg daily.  3. Xanax 0.5 mg p.o. t.i.d. p.r.n.  4. Glipizide 5 mg p.o. daily.  5. Prilosec 20 mg p.o. daily.  6. Vicodin one to two tablets p.o. q.4h p.r.n.   FOLLOW UP:  1. The patient to have routine followup with Dr. Reche Dixon in 4/16 as     previously scheduled.  2. The patient instructed to call for an appointment at the acute care     clinic at [facility] if he does not continue to improve     postdischarge.   ADMISSION HISTORY AND PHYSICAL:  The patient is a 55 year old white male who  presented with complaint of nausea and vomiting x1 week.  He had not been  able to keep down and had not taken his medications.  He also had associated  epigastric pain that was made worse by eating and vomiting.  He had one  normal stool approximately three days prior to admission.  He had one  episode of blood streaks in vomiting a few days prior to admission, but  generally had no hematemesis  or melena/bright red blood per rectum.  No  diarrhea.  No fever.  The patient's blood sugars have remained in the 80s to  150s during this time.   ALLERGIES:  No drug allergies.   PAST MEDICAL HISTORY:  Peripheral vascular disease status post  aortobifemoral bypass (6/03) and left femoral to peroneal bypass (12/03).  Diabetes type 2, hypertension, anxiety, pelvic fracture (1985).   MEDICATIONS:  1. Vasotec 10 mg p.o. daily.  2. Aspirin 325 mg daily.  3. Hydrochlorothiazide 25 mg daily/triamterene 37.5 mg daily.  4. Xanax 0.5 mg p.r.n.  5. Glipizide 5 mg p.o. daily.  6. Vicodin.  7. Metaproterenol 50 mg daily.   SOCIAL HISTORY:  The patient is a former smoker  with a two-three pack per  day x30 years history and quit in 5/03.  History of heavy alcohol use.  Denies any cocaine, IV drug use.  He is single and lives with his mother.   FAMILY HISTORY:  Significant for a mother who has coronary artery disease  and a father who died at age 4 with stroke.   REVIEW OF SYSTEMS:  As per HPI with the addition of weakness.  Other systems  otherwise negative.   PHYSICAL EXAMINATION:  VITAL SIGNS:  Pulse 119, blood pressure 113/83,  temperature 97.5, respirations 18.  Oxygen saturation 99% on room air.  GENERAL:  He is a pleasant white male in no acute distress.  HEENT:  Within normal limits.  LUNGS:  Clear to auscultation bilaterally.  CARDIOVASCULAR:  Regular  rate and rhythm without murmurs, rubs or gallops.  Had good perfusion to his bilateral feet.  GI:  Abdomen was soft, nontender, nondistended with positive bowel sounds.  EXTREMITIES:  He had an ulcer in his left dorsum of foot.  No edema or  erythema.  NEUROLOGIC:  Nonfocal exam.  RECTAL:  Revealed guaiac negative stool.   LABORATORY DATA:  Sodium 121, potassium 2.5, chloride 75, bicarb 32, BUN 19,  creatinine 0.9.  Blood sugar 115, white count was 13.8 with __________ 10.2,  hemoglobin 14.1.  LFTs were normal.    ASSESSMENT/PLAN:  This is a 55 year old male with a history of abdominal  surgery who presents with findings consistent with a small bowel  obstruction.  The patient was admitted for further observation and treatment  for his small bowel obstruction.  The following issues were addressed during  this hospital course.   1. Small bowel obstruction.  The patient had good results with conservative     management.  He was treated with Phenergan p.r.n., IV fluids, pain     control, and NG tube.  He improved clinically every day.  By hospital day     #3, he was tolerated clears.  NG tube was discontinued on hospital day     #5.  He was tolerating a full diet by hospital day #7.  At the time of     discharge he was eating a regular diet without any nausea or pain.  He     was experiencing some flatus, but not had a formed bowel movement.  The     cause of his small bowel obstruction is likely secondary to an adhesion     as a result of his previous abdominal surgery.  It is recommended,     however, that if this patient were to have another episode of small bowel     obstruction he would subsequently undergo further studies to ascertain as     to the exact etiology of the inciting factor.  This could be done by     either a small bowel follow-through or possibly CT scan.   1. Dehydration secondary to #1.  The patient received fluid boluses and then     maintenance fluid during the initial part of his hospital course until he     was able to tolerate p.o.   1. Metabolic alkalosis attributed to GI loss.  This corrected with IV fluids     and management of the small bowel obstruction.  At the time of discharge,     the patient's electrolytes were as follows:  Sodium 138, potassium 5.0,     chloride 110, bicarb 24.   1. Hypokalemia.  This was likely secondary to GI loss.  The patient    initially received potassium by IV and then p.o. when tolerating p.o.  At     the time of discharge, his  potassium appeared to be replenished at a     level of 5.0.   1. Peripheral vascular disease, status post multiple bypasses.  There are no     issues throughout hospital course.  The patient had good distal pulses.   1. Diabetes type 2.  The patient was n.p.o. His Glipizide was held.  During     the time in which he was n.p.o. he required no sliding scale insulin.     Once he was tolerating p.o. his Glipizide was resumed and he tolerated     this well.  Blood sugars ranged within 75 to 120s throughout most of     hospital course.   1. Hypertension.  When the patient was n.p.o. his blood pressure medicines     were held.  His blood pressures were noted to be elevated while n.p.o.     but were then brought back to an adequate blood pressure when his blood     pressure medicines restarted when he was taking n.p.o.  There are no     other issues.   1. Anxiety.  The patient subjectively relayed feelings of anxiety and exuded     some signs and was given Xanax 0.5 mg p.o. p.r.n. once he was tolerating     p.o.  There were no other issues.   1. Left foot ulcer.  During the hospital course the patient received wound     care consult who recommended hydrotherapy as well as Accuzyme dressing     changes. These were continued throughout the hospital course.  The     patient otherwise had no signs or symptoms of infection of his left foot     ulcer.  Recommend continue wound care as an outpatient.   DISPOSITION:  The patient was discharged to home with the previously  prescribed medications and followup.  He was doing well, eating well and  feeling well.                                                Douglass Rivers, M.D.    CH/MEDQ  D:  11/09/2002  T:  11/11/2002  Job:  [id number]   cc:   Dineen Kid. Reche Dixon, M.D.  [address]  [place] [address]  Fax: [phone]

## 2010-12-24 NOTE — Discharge Summary (Signed)
   NAME:  Andrew [name], Andrew [name]                         ACCOUNT NO.:  [id number]   MEDICAL RECORD NO.:  [id number]                   PATIENT TYPE:  INP   LOCATION:  2013                                 FACILITY:  [facility]   PHYSICIAN:  Balinda Quails, M.D.                 DATE OF BIRTH:  September 26, 1955   DATE OF ADMISSION:  07/03/2002  DATE OF DISCHARGE:  07/10/2002                                 DISCHARGE SUMMARY   ADMISSION DIAGNOSIS:  Peripheral vascular disease.   SECONDARY DIAGNOSES:  1. Type 2 diabetes mellitus.  2. Hypertension.   DISCHARGE DIAGNOSIS:  Peripheral vascular disease.   HOSPITAL COURSE:  Andrew [name] is a patient well-known to Dr. Madilyn Fireman.  The  patient had previously undergo an aorto-bifemoral bypass graft secondary to  peripheral vascular disease. He was seen and evaluated again by Dr. Madilyn Fireman as  an outpatient. Dr. Madilyn Fireman recommended surgical correction of the patient's  peripheral vascular disease.  On the day of admission, Dr. Madilyn Fireman performed a  left femoral-to-peroneal artery bypass graft.  No complications were noted  during procedure.  Postoperatively, the patient's vascular graft maintained  throughout his entire postoperative course. He had the new diagnosis of  type 2 diabetes mellitus which was adequately controlled with Amaryl and  sliding scale insulin.  He had adequate pain control with oral Dilaudid.  Subsequently, he was deemed stable for discharge home on 07/10/02.   DISCHARGE MEDICATIONS:  1. Lopressor 15 mg 1/2 tablet every 12 hours.  2. Vasotec 2.5 mg 1 daily.  3. Aspirin 325 mg 1 daily.  4. Hydrochlorothiazide 25 mg 1 daily.  5. Xanax 0.5 mg as needed.  6. Amaryl 0.5 mg 1 daily.  7. Dilaudid 2 mg, 1-2 tablets as needed for pain.   DISPOSITION:  Activity:  Patient told no driving, strenuous activity,  lifting, or heavy exercises.  Was told to walk and continue breathing  exercises. Discharge diet:  1800 calorie ADA diet. Wound care: Patient told  he could  shower and clean his incisions with soap and water. In addition, he  was told to have the staples taken out in Dr. Madilyn Fireman' office on Friday on  12/12 at 10:30 a.m. He was told to see  Dr. Madilyn Fireman on Monday 12/15 at 9:30 a.m.      Levin Erp. Prentice Docker, M.D.    BGS/MEDQ  D:  07/09/2002  T:  07/09/2002  Job:  [id number]

## 2010-12-24 NOTE — Op Note (Signed)
[place]  Patient:    YARDLEY, LEKAS Visit Number: [id number] MRN: [id number]          Service Type: SUR Location: 2300 2311 01 Attending Physician:  Melvenia Needles Dictated by:   Denman George, M.D. Proc. Date: 01/23/02 Admit Date:  01/23/2002                             Operative Report  PREOPERATIVE DIAGNOSIS:  Severe aortoiliac occlusive disease.  POSTOPERATIVE DIAGNOSIS:  Severe aortoiliac occlusive disease.  OPERATION PERFORMED: 1. Aortobifemoral bypass. 2. Right femoral endarterectomy.  SURGEON:  Denman George, M.D.  ASSISTANT: 1. Caralee Ates, M.D. 2. Larina Earthly, M.D. 3. Loura Pardon, P.A.  ANESTHESIA:  General endotracheal.  ANESTHESIOLOGIST:  Dr. Jacklynn Bue.  INDICATIONS FOR PROCEDURE:  The patient is a 55 year old male with a history of heavy tobacco abuse.  He is currently unemployed.  He was referred to the office with advanced peripheral vascular disease.  Arteriography revealed extensive aortoiliac disease with right external iliac occlusion.  Right common femoral artery thrombosis.  Left superficial femoral occlusion. Bilateral tibial disease.  The patient required preoperative Cardiolite evaluation.  Following this, he has consented for aortobifemoral bypass.  The risks and benefits of the procedure were explained to the patient and his mother in detail.  Operative risks include but not limited to MI, CVA, renal failure, limb loss, bleeding, infection or death.  DESCRIPTION OF PROCEDURE:  Patient brought to the operating room in stable condition.  Placed in supine position.  General endotracheal anesthesia induced.  The abdomen and both legs prepped and draped in a sterile fashion.  Bilateral longitudinal groin skin incisions were made.  Dissection carried down through subcutaneous tissues and lymphatics with electrocautery.  The inguinal ligament mobilized.  The common femoral artery freed and  encircled with a vessel loop.  Distal dissection carried down to expose the superficial femoral and profunda femoris origins which were encircled with vessel loops.  In the right groin, the femoral artery was thrombosed, superficial femoral and profunda femoris artery were patent.  In the left groin, the common femoral artery was patent with primary run off profunda femoris.  Midline skin incision made from xiphoid to umbilicus.  Subcutaneous tissues divided with electrocautery.  Midline fascia divided. Peritoneal cavity entered without difficulty.  Full laparotomy evaluation carried out.  The stomach and duodenum were normal.  Liver, gallbladder, bile duct and pancreas were normal.  Small bowel and large bowel were unremarkable.  The retroperitoneum did reveal aortoiliac disease.  Calcified infrarenal aorta.  The juxtarenal aorta was patent with moderate plaque.  The small bowel was retracted to the right, transverse colon brought superiorly.  The aorta mobilized up to the origin of the left renal artery and encircled with an umbilical tape.  Lumbar arteries were controlled with clips. The origin of the inferior mesenteric artery was freed and isolated.  Retroperitoneal tunnels were then created from the aortic bifurcation to each groin.  The patient was administered 5000 units of heparin intravenously. 25 gm mannitol.  The infrarenal aorta controlled with an aortic DeBakey clamp. The distal aorta controlled with a peripheral DeBakey clamp.  The longitudinal arteriotomy made in the anterior wall of the infrarenal aorta.  There was some free plaque present which debrided.  A 16 x 8 bifurcated Hemashield graft was anastomosed end-to-side to the infrarenal aorta with a running 3-0 Prolene suture.  At completion  of this, adequate flushing carried out.  Clamps were then removed and clamps placed on each limb of the graft.  The limbs of the graft were then tunneled to the respective  groins.  The right femoral vessel was controlled with clamps.  A longitudinal arteriotomy made in the right common femoral artery, extended out into the superficial femoral artery.  There was recent thrombus present which was removed along with plaque which was endarterectomized.  Intact runoff via the profunda and superficial femora were present.  The right limb of the graft was beveled and anastomosed end-to-side to the right common femoral artery using running 5-0 Prolene suture. At completion of the right femoral anastomosis, vessels were flushed. Clamps were removed reperfusing the right leg.  Attention then placed on the left leg.  The longitudinal arteriotomy made in the left common femoral artery.  Dominant run off was the profunda femoris. Left limb of the graft beveled and anastomosed end-to-side to the left common femoral artery using running 6-0 Prolene suture.  At completion of this, adequate hemostasis was obtained.  The patient was administered 50 mg of protamine intravenously.  Sponge and instrument counts correct.  Retroperitoneum was closed over the graft with running 2-0 Vicryl suture. Midline fascia closed with running #1 PDS suture.  Staples applied to the skin.  Each groin incision was irrigated with antibiotic solution.  Each groin incision was then closed in two layers of running 2-0 Vicryl suture for subcutaneous tissue and staples applied.  Sterile dressings were applied. The patient tolerated the procedure well.  Transferred to recovery room in stable condition. Dictated by:   Denman George, M.D. Attending Physician:  Melvenia Needles DD:  01/23/02 TD:  01/24/02 Job: [id number]

## 2010-12-24 NOTE — Discharge Summary (Signed)
[place]. [facility]  Patient:    Andrew [name], Andrew [name] Visit Number: [id number] MRN: [id number]          Service Type: SUR Location: 2000 2010 01 Attending Physician:  Melvenia Needles Dictated by:   Adair Patter, P.A.-C. Admit Date:  01/23/2002 Discharge Date: 02/02/2002   CC:         CVTS office   Discharge Summary  DATE OF BIRTH:  29-Dec-1955  ADMITTING DIAGNOSIS:  Aortoiliac occlusive disease.  DISCHARGE DIAGNOSIS:  Peripheral vascular disease.  HOSPITAL COURSE:  The patient was seen and evaluated by Dr. Madilyn Fireman as an outpatient secondary to aortoiliac occlusive disease.  He felt aortobifemoral bypass graft was the best course of action in which the patient underwent on the date of admission.  No complications were noted during the procedure.  Postoperatively, the patients hospital course was complicated by pain control.  The pain control was achieved with the PCA pump which was eventually weaned off as tolerated.  The patients diet was advanced as tolerated.  He began ambulating with the help of physical therapy and eventually begin ambulating an adequate amount.  He was deemed stable for discharge on postoperative day #9, February 02, 2002.  MEDICATIONS ON DISCHARGE: 1. Tylox one to two tablets every four to six hours as needed for pain. 2. Nicoderm patch 21 mg per 24 hours one pack daily.  The patient was told not    to smoke while using this product. 3. Elavil 75 mg one daily. 4. Atenolol 50 mg one daily. 5. Xanax 0.5 mg one tablet every four hours as needed.  ACTIVITY:  The patient was told no driving, strenuous activity, or lifting heavy objects, and he is told to walk daily.  DISCHARGE DIET:  Low fat and low salt.  WOUND CARE:  The patient can fully shower, clean his incision with soap and water.  He will come by the CVTS office in approximately six days for staple removal.  DISPOSITION:  To home.  FOLLOWUP:  He will see Dr.  Madilyn Fireman in the CVTS office in two weeks.  The office will call him to verify the time and date of his appointment. Dictated by:   Adair Patter, P.A.-C. Attending Physician:  Melvenia Needles DD:  02/01/02 TD:  02/04/02 Job: [id number]

## 2010-12-24 NOTE — Cardiovascular Report (Signed)
[place]  Patient:    NIGIL, BRAMAN Visit Number: [id number] MRN: [id number]          Service Type: DSU Location: [facility] 2872 01 Attending Physician:  Melvenia Needles Dictated by:   Denman George, M.D. Proc. Date: 11/26/01 Admit Date:  11/26/2001 Discharge Date: 11/26/2001   CC:         Peripheral Vascular Catheterization Lab  Talmage Coin, M.D.   Cardiac Catheterization  DIAGNOSES:  Aortoiliac occlusive disease.  PROCEDURE:  Aortogram with bilateral lower extremity runoff arteriography.  ACCESS:  Right common femoral artery 5-French sheath.  CONTRAST:  150 mL Visipaque.  COMPLICATIONS:  None apparent.  CLINICAL NOTE:  This is a 55 year old male with a history of heavy tobacco abuse.  He was seen in the office with progressive inability to ambulate, secondary to claudication.  He has also had some slow healing abrasions on his right leg.  Doppler evaluation revealed evidence of extensive aortoiliac occlusive disease.  He is brought to the catheterization lab at this time for diagnostic arteriography.  PROCEDURE NOTE:  The patient was brought to the catheterization lab in stable condition.  He was placed in the supine position.  Informed consent was obtained.  Both groins were prepped and draped in sterile fashion.  Skin and subcutaneous tissue of the right groin were instilled with 1% Xylocaine.  A needle was easily introduced into the right common femoral artery.  Then a .035 J-wire was passed through the needle into the mid abdominal aorta.  A 5-French sheath was advanced over the guide wire, the dilator removed and the sheath then flushed with heparin saline solution.  The patient was administered 2 mg of Nubain and 1 mg of Versed intravenously. A pigtail catheter was advanced over the guide wire into the mid abdominal aorta.  Contrast injection made. This revealed single widely patent bilateral renal arteries.   Brisk flow was also noted through the superior mesenteric artery. There was tapering of the infrarenal aorta down to the bifurcation.  Ulcerated plaque in the distal abdominal aorta was evident.  There was no dominant aortic stenosis in the infrarenal segment.  The common iliac arteries bilaterally were small in caliber.  The right external iliac artery was severely stenosed throughout its length, indeed, the catheter occluded the artery.  The patient was administered 2000 units of heparin intravenously. The right internal iliac artery was patent.  The left internal and external iliac arteries were also patent, although appeared lined with long tubular atherosclerotic plaque.  Lower extremity runoff arteriography was obtained.  The left lower extremity revealed the common femoral artery to be patent.  The left profunda femoris artery was also intact.  The left superficial femoral artery was occluded throughout its length.  There was reconstitution of the popliteal artery in the above knee position.  The left popliteal artery provided flow to the tibioperoneal trunk and anterior tibial artery.  The anterior tibial artery appeared to be occluded proximally.  Dominant runoff in the left lower extremity was the peroneal artery, with a diseased posterior tibial artery.  The right lower extremity revealed the right external iliac artery to be subtotally occluded and indeed occluded by the catheter.  There was occlusion of the right common femoral artery.  Reconstitution of the right profunda femoris and superficial femoral arteries was evident in the proximal side. The right superficial femoral artery was severely diseased and small in caliber throughout its length; provided continuous flow through the popliteal  level.  The popliteal artery branched to the anterior tibial and peroneal arteries.  The peroneal artery, the dominant runoff in the right lower extremity with a diseased anterior  tibial artery.  There was reconstitution of the posterior tibial artery at the right ankle.  A peak hold arteriogram was obtained, and this provided further views of the tibial vessels.  In the right lower extremity the dominant runoff was the peroneal artery, with a diseased anterior tibial artery and reconstitution of a distal posterior tibial artery.  In the left lower extremity the dominant runoff was the peroneal artery with a diseased posterior tibial artery providing flow also.  This completed the arteriogram procedure.  The guide wire was reinserted.  The catheter removed along with the guide wire.  The patient was transferred to the holding area and the right femoral sheath was removed.  No apparent periprocedure complications.  TOTAL CONTRAST:  150 mL Visipaque.  FINAL IMPRESSION: 1. Moderate infrarenal aortic atherosclerotic disease. 2. Severe iliac occlusive disease bilaterally. 3. Left superficial femoral artery occlusion. 4. Bilateral tibial vessel occlusive disease.  DISPOSITION:  These results have been reviewed with the patient and his mother.  The patient will undergo preoperative cardiac evaluation, and visit Social Services to obtain insurance hopefully for Medicaid.  He has been instructed regarding visiting Social Services to assure that he can obtain some insurance coverage.  It has been stressed to the patient that he must do this.  Once he has undergone adequate cardiac evaluation, it is recommended that the patient undergo aortobifemoral bypass for treatment of severe claudication. Dictated by:   Denman George, M.D. Attending Physician:  Melvenia Needles DD:  12/12/01 TD:  12/14/01 Job: [id number]
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
## 2010-12-24 NOTE — H&P (Signed)
NAME:  Andrew [name], Gunderson NO.:  [id number]   MEDICAL RECORD NO.:  [id number]                   PATIENT TYPE:   LOCATION:                                       FACILITY:   PHYSICIAN:  Balinda Quails, M.D.                 DATE OF BIRTH:  05/16/1956   DATE OF ADMISSION:  07/03/2002  DATE OF DISCHARGE:                                HISTORY & PHYSICAL   CHIEF COMPLAINT:  Nonhealing lower extremity ulcers and peripheral vascular  disease.   HISTORY OF PRESENT ILLNESS:  This is a 55 year old white male with a history  of peripheral vascular disease and claudication who underwent aortobifemoral  bypass grafting by Dr. Madilyn Fireman in June of this year.  He says that his severe  debilitating claudication symptoms have improved substantially since  surgery; however, he has continued to be plagued with severe left foot pain  associated with an arterial ulcer.  He also has a right pretibial ulcer.  Because of these nonhealing ulcers and peripheral vascular disease, Dr.  Madilyn Fireman has recently recommended the patient undergo a left lower extremity  femoral-to-popliteal bypass graft.  This has been discussed and is scheduled  for 07/03/2002.  Today, the patient has no new complaints.   PAST MEDICAL HISTORY:  1. Peripheral vascular disease.  2. Hypertension.  3. Anxiety disorder.  4. Pelvic fracture in 1985.   PAST SURGICAL HISTORY:  1. Aortobifemoral bypass graft 01/23/2002 with femoral endarterectomy by Dr.     Madilyn Fireman.  2. Penile implant secondary to forklift accident and crushed pelvis in 1985.     He also reportedly had a bladder injury with that accident.   MEDICATIONS:  1. Enalapril 2.5 mg daily.  2. Metoprolol 25 mg p.o. q.12h.  3. Xanax 0.5 mg p.o. t.i.d.  4. HCTZ 25 mg p.o. q.d.  5. Percocet and Vicodin daily for arterial ulcer pain.   ALLERGIES:  No known drug allergies.   REVIEW OF SYSTEMS:  He denies chest pain and palpitations.  He denies  shortness of  breath, fever, or cough.  He denies GI or GU complaints.  He  denies TIA symptoms such as visual changes, garbled speech, extremity  weakness.  He says that his claudication symptoms had improved since surgery  in June.  He has arterial ulcer pain, especially in his left foot as  indicated in the HPI.  (He denies history of coronary artery disease, MI,  CVA, TIA, or diabetes.)   FAMILY HISTORY:  Mother is alive at age 109 with history of CABG.  Father is  deceased at age 67 with CVA.  Sister is alive at age 33 with hypertension.  Brother is alive and well at age 61.  He has a brother who is deceased from  a motorcycle accident.   SOCIAL HISTORY:  He lives at home with his mother.  He is unemployed.  He  has no children.  He is single.  His mother is with him here today.  He quit  smoking cigarettes after two to three packs a day for 30 years in June of  this year.  He quit alcohol two years ago after a fairly heavy daily  regimen.   PHYSICAL EXAMINATION:  GENERAL:  A 55 year old white male in no acute  distress, alert and oriented x 3, appears somewhat thin, disheveled.  VITAL SIGNS:  Blood pressure 120/90, heart rate 88 and regular, respirations  14.  HEENT:  Normocephalic and atraumatic.  Pupils are equal, round, and reactive  to light.  Extraocular movements intact.  Visual acuity is grossly intact  OU.  Oropharynx is grossly normal.  NECK:  Supple.  No JVD, no bruits, no lymphadenopathy.  There are no carotid  bruits.  CHEST:  Symmetrical.  Breath sounds are clear and equal anteriorly and  posteriorly.  CARDIAC:  Regular rate and rhythm, S1 and S2, with no murmur, rub, or  gallop.  ABDOMEN:  Soft, nontender, nondistended, with bowel sounds present.  There  is a healed midline abdominal scar.  EXTREMITIES:  There are bilateral femoral bruits present, left greater than  right.  Extremities are warm, adequately perfused, not acutely ischemic.  He  has a dime-size ulceration on the  dorsum of his left foot and a longitudinal  shaped ulcer in his right mid calf area medially.  VASCULAR:  Carotids are 2+ pulses bilaterally with no bruits.  Femoral  pulses are 2+ bilaterally.  He has a 3/6 left femoral bruit and a 2/6 right  femoral bruit.  There are no popliteal or pedal pulses palpated bilaterally.  NEUROLOGIC:  Grossly nonfocal throughout.  Cranial nerves II-XII intact.  He  has normal sensation in his lower extremities, normal range of movement.  Gait is steady.  He has a slight limp secondary to foot ulcer pain on the  left.  Deep tendon reflexes are 1+.  Muscular strength is +5/5.   ASSESSMENT/PLAN:  This is a 55 year old white male with extensive peripheral  vascular disease and nonhealing ulcers in the lower extremities.  He has an  especially painful ulcer in the left lower extremity.  Dr. Madilyn Fireman has  recommended left lower extremity femoral-to-popliteal bypass graft.  This is  scheduled for 07/03/2002.  The patient is stable for surgery.  Dr. Madilyn Fireman has  seen and evaluated him and discussed the surgery with him.     Lissa Merlin, Alver Sorrow, M.D.    Alwyn Ren  D:  07/01/2002  T:  07/01/2002  Job:  [id number]   cc:   Talmage Coin, M.D.  [address]  [place]  [place] [address]  Fax: [phone]   Dineen Kid. Reche Dixon, M.D.  [address]  [place] [address]  Fax: [phone]

## 2010-12-24 NOTE — Op Note (Signed)
NAME:  BENNO, BRENSINGER                         ACCOUNT NO.:  [id number]   MEDICAL RECORD NO.:  [id number]                   PATIENT TYPE:  INP   LOCATION:  3301                                 FACILITY:  [facility]   PHYSICIAN:  Balinda Quails, M.D.                 DATE OF BIRTH:  1956/06/26   DATE OF PROCEDURE:  07/03/2002  DATE OF DISCHARGE:                                 OPERATIVE REPORT   SURGEON:  Balinda Quails, M.D.   ASSISTANT:  Maple Mirza, P.A.   ANESTHETIC:  General endotracheal.   ANESTHESIOLOGIST:  Sheldon Silvan, M.D.   PREOPERATIVE DIAGNOSIS:  Ischemic left foot.   POSTOPERATIVE DIAGNOSIS:  Ischemic left foot.   PROCEDURES:  1. Left femoral peroneal bypass with nonreversed translocated saphenous vein     graft.  2. Intraoperative arteriogram.  3. Debridement of left foot.  4. Debridement of right leg.   CLINICAL NOTE:  The patient is a 55 year old male with a history of heavy  tobacco use.  He has previously undergone aortobifemoral bypass for limb  salvage ischemia.  He had a good result from this procedure in his right leg  with improved ankle-brachial index up to 0.70.  He has been healing a lesion  on his right lower leg since the operative procedure.   He recently developed increasing pain in his left foot and a dark ischemic  ulcer on the dorsum of the foot.  Review of his arteriogram reveals left  superficial femoral artery occlusion.  He does have reconstitution of the  disease above knee popliteal artery and single peroneal vessel runoff.   The patient is brought to the operating room at this time for limb salvage,  left femoral distal bypass.   OPERATIVE PROCEDURE:  The patient brought to the operating room in stable  condition.  Placed in the supine position.  General endotracheal anesthesia  induced.  Left leg prepped and draped in a sterile fashion.  A longitudinal  skin incision made through the scar on the left groin.  Dissection carried  down through the subcutaneous tissue with electrocautery.  The dissection  carried down to the expose the left limb of the aortofemoral graft.  This  was freed at the inguinal ligament and encircled with a vessel loop.  The  left external iliac artery was also freed, separated from the left limb of  the aortofemoral graft, and encircled with a vessel loop.  Distal dissection  carried down to expose the origin of the profunda femoris and superficial  femoral arteries, which were also encircled with vessel loops.   In the medial aspect of the groin incision, the saphenofemoral junction was  exposed.  Tributaries to the saphenous vein ligated with 3-0 silk and  divided.   Separated longitudinal skin incisions were then made through the course of  the left thigh down to the knee.  The saphenous vein exposed  through these  incisions, harvested, tributaries ligated with 3-0 silk and divided.   Through the above knee incision the fascia was incised and sartorius muscle  reflected anteriorly.  The above knee popliteal segment exposed.  This was  severely diseased with plaque.  Butterfly needle was introduced into the  lumen and an on-table arteriogram obtained.  This revealed patency of the  popliteal artery even though it was diseased, and dominant right off the  peroneal artery.   At this time it was decided to carry out a below-knee revascularization to  either the popliteal or peroneal artery.  The vein was further harvested  distally down to the proximal calf.   Through the medial calf incision, the popliteal fossa was entered.  The  gastrocnemius fascia incised.  The medial side of the gastroc muscle  reflected posteriorly.  The distal popliteal artery was freed and encircled  with the vessel loop.  The distal popliteal artery then opened  longitudinally.  This was very diseased with a large eccentric plaque of the  small lumen.  It would accept a #2 dilator which was passed distally  along  the tibioperoneal trunk, which was also severely diseased.  The soleus  insertion was taken off of the posterior margin of the tibia.  Bridging  veins were ligated with 3-0 silk and divided.  The tibioperoneal trunk was  exposed down to its bifurcation into peroneal and posterior tibial arteries.  The peroneal artery was a dominant runoff vessel and this was also severely  diseased 2 mm in size.  The peroneal artery freed and encircled with vessel  loops.  The vein was then harvested, ligating it proximally and distally  with 2-0 silk tie.  The vein dilated with heparin and saline solution.  It  was a good and quality throughout its length.   A tunnel was then created in the subsartorial plane between the heads of the  gastrocnemius muscles from the below-knee incision to the groin.  The  patient then administered 5000 units of heparin intravenously.  He received  an additional 2000 units of heparin intravenously.   The left femoral vessels were then controlled with clamps.  The left limb of  the aortofemoral graft was also controlled with a clamp.  A longitudinal  incision made over the hood of the left limb of the aortofemoral graft.  The  saphenous vein was opened longitudinally and anastomosed in the nonreversed  fashion end to side to the hood of the aortofemoral graft with running 6-0  Prolene sutures.  At completion of this anastomosis, clamps were removed  from the femoral vessels.  A retrograde valvotome then passed up the course  of the vein.  Valves were lysed under direct vision.  Excellent inflow was  obtained.  This was then tunneled through the subsartorial tunnel down to  the peroneal artery.  The peroneal artery controlled proximally and distally  with serrefine clamps.  The longitudinal arteriotomy made.  The peroneal  artery was also severely diseased, 1.5 to 2 mm in size.  The saphenous vein was doubled and an anastomosis end to side to the proximal peroneal  artery  with running 7-0 Prolene.  At completion of this, the vein was flushed,  clamps removed, excellent flow present through the vein and the peroneal  artery.  Strong signal present at the foot and the peroneal artery  collateralized into the posterior tibial artery.   The popliteal artery was ligated proximally and distally with 2-0 silk  to  control bleeding.   The left groin incision was then closed with running 2-0 Vicryl suture in  two subcutaneous layers, and staples applied to the skin.   The vein harvest incisions to the left leg were closed with running 2-0  Vicryl suture in a single subcutaneous layer, and staples applied to the  skin.   The sartorius fascia was closed with running 2-0 Vicryl suture and the  subcutaneous tissue closed with running 3-0 Vicryl suture.  Staples applied  to the skin.   Below the knee, the gastrocnemius fascia was reapproximated with interrupted  2-0 Vicryl suture.  Subcutaneous tissue closed with running 3-0 Vicryl  suture.  Staples applied to the skin.  Sterile dressings were then applied  to the left leg.   Attention then placed on the left foot.  The black eschar overlying the  dorsum of the foot, measuring approximately 3 cm in diameter, was debrided.  This did go down and involve just superficial to the tendons.  The wound  irrigated with saline solution and dressed with wet 4 x 4's, Kerlix, and an  Ace wrap.   The remaining wound on the right lower leg was debrided superficially of  eschar.  This revealed good granulation tissue and is continuing to heal.  Dressed with a wet 4 x 4, Kerlix, and Ace wrap.   The patient tolerated the operative procedure well, was transferred to the  recovery room in stable condition.                                                Balinda Quails, M.D.    [facility]  D:  07/03/2002  T:  07/04/2002  Job:  [id number]

## 2011-01-12 ENCOUNTER — Other Ambulatory Visit: Payer: Self-pay | Admitting: *Deleted

## 2011-01-12 MED ORDER — HYDROCODONE-ACETAMINOPHEN 10-325 MG PO TABS: 1.0000 | ORAL_TABLET | ORAL | Status: DC | PRN

## 2011-01-12 MED ORDER — CLONAZEPAM 1 MG PO TABS: 1.0000 mg | ORAL_TABLET | Freq: Three times a day (TID) | ORAL | Status: DC | PRN

## 2011-01-12 NOTE — Telephone Encounter (Signed)
Pt would like to get Friday. He gets meds at our [facility]. If you want to refill I can wait and call in on Friday.  Pt # [phone]

## 2011-01-13 NOTE — Telephone Encounter (Signed)
Called to pharm, pharmacist states by law they cannot fill until 6/8, i informed pt, he was not happy but acceptable

## 2011-02-10 ENCOUNTER — Other Ambulatory Visit: Payer: Self-pay | Admitting: *Deleted

## 2011-02-10 MED ORDER — CLONAZEPAM 1 MG PO TABS: 1.0000 mg | ORAL_TABLET | Freq: Three times a day (TID) | ORAL | Status: DC | PRN

## 2011-02-10 MED ORDER — HYDROCODONE-ACETAMINOPHEN 10-325 MG PO TABS: 1.0000 | ORAL_TABLET | ORAL | Status: DC | PRN

## 2011-02-10 NOTE — Telephone Encounter (Signed)
i have spoken to pt, transferred to scheduler for appt and called meds in to pharmacy

## 2011-02-10 NOTE — Telephone Encounter (Signed)
Please sch appt with PCP end of Aug Gave refill so would last until appt. No contract for the clonazepam so will need the contract at the next visit.

## 2011-02-25 ENCOUNTER — Ambulatory Visit: Payer: Self-pay

## 2011-03-07 ENCOUNTER — Encounter: Payer: Self-pay | Admitting: Internal Medicine

## 2011-03-07 ENCOUNTER — Ambulatory Visit (INDEPENDENT_AMBULATORY_CARE_PROVIDER_SITE_OTHER): Payer: Self-pay | Admitting: Internal Medicine

## 2011-03-07 ENCOUNTER — Ambulatory Visit: Payer: Self-pay | Admitting: Licensed Clinical Social Worker

## 2011-03-07 VITALS — BP 142/84 | HR 80 | Temp 98.9°F | Ht 71.0 in | Wt 160.5 lb

## 2011-03-07 DIAGNOSIS — F17200 Nicotine dependence, unspecified, uncomplicated: Secondary | ICD-10-CM

## 2011-03-07 DIAGNOSIS — F329 Major depressive disorder, single episode, unspecified: Secondary | ICD-10-CM

## 2011-03-07 DIAGNOSIS — Z720 Tobacco use: Secondary | ICD-10-CM

## 2011-03-07 DIAGNOSIS — E785 Hyperlipidemia, unspecified: Secondary | ICD-10-CM

## 2011-03-07 DIAGNOSIS — F411 Generalized anxiety disorder: Secondary | ICD-10-CM

## 2011-03-07 DIAGNOSIS — I70219 Atherosclerosis of native arteries of extremities with intermittent claudication, unspecified extremity: Secondary | ICD-10-CM

## 2011-03-07 DIAGNOSIS — M549 Dorsalgia, unspecified: Secondary | ICD-10-CM

## 2011-03-07 MED ORDER — TRAMADOL HCL 50 MG PO TABS: 50.0000 mg | ORAL_TABLET | Freq: Four times a day (QID) | ORAL | Status: DC | PRN

## 2011-03-07 MED ORDER — SERTRALINE HCL 50 MG PO TABS: 100.0000 mg | ORAL_TABLET | ORAL | Status: DC

## 2011-03-07 MED ORDER — TRAZODONE HCL 50 MG PO TABS: 50.0000 mg | ORAL_TABLET | Freq: Every day | ORAL | Status: DC

## 2011-03-07 NOTE — Assessment & Plan Note (Addendum)
Patient has a history of chronic back pain, as well as chronic LE pain from PVD, currently managed with Norco -I discussed with the patient at length that using more Norco than is prescribed via his pain contract is a violation of the contract, and that early refills will typically not be filled -however, since the patient is not on an adjunct pain medication, we will refill his norco THIS ONE TIME ONLY -we are also adding Tramadol, 50 mg, and instructing the patient to use this medication 3-4 times/day as an adjunct to his Norco, as a way to help manage the patient's pain using less Norco

## 2011-03-07 NOTE — Assessment & Plan Note (Signed)
Patient instructed to come fasting to his next appointment, so a lipid panel could be drawn.

## 2011-03-07 NOTE — Assessment & Plan Note (Addendum)
The patient notes a history of anxiety, as well as trouble sleeping, currently managed with clonazepam (for which the patient requests an early medication refill) and sertraline -I also had a long discussion with the patient about his use of clonazepam, and a medication contract for this medication was filled out today -this medication prescription will also be filled early today as a ONE TIME ONLY early refill, and the patient is instructed to use adjunct medications to decrease his clonazepam use -to prevent the patient from using extra doses of this medication at night for sleeping, we are prescribing Trazodone 50 mg, 1-2 tablets qhs prn -we are also increasing the dose of his sertraline to 100 mg

## 2011-03-07 NOTE — Assessment & Plan Note (Signed)
Patient is currently trying to quit smoking, and was encouraged to continue this endeavor, as it will help his general health and his PVD, among other things -patient believe the price of chantix will be too high for him to afford -the patient agreed to try nicotine gum or patches, and we were informed by Gilford Silvius that the cheapest way to obtain these is by buying them OTC at a [facility] retail store -referral to Lupita Leash for further smoking cessation tips

## 2011-03-07 NOTE — Patient Instructions (Addendum)
For your pain, we have refilled your Norco and Clonazepam medications early.  However, this is a one-time early refill, and in the future, your medication will have to last you the whole month -for additional pain relief, we are prescribing Tramadol 50 mg, take 1 tablet every 4-6 hours as needed for pain -to help you sleep, we are prescribing Trazodone 50 mg, take 1-2 tablets at night -we are also increasing the dose of your Sertraline (Zoloft), to 100 mg per day  To help you quit smoking, try buying nicotine gum or patches from retail stores such as [employer], as discussed -please follow-up with our Child psychotherapist for further ways to help you quit  Please return for a follow-up appointment in about 2 months.  Please DO NOT EAT before this appointment, and we will check your cholesterol at that time.  Smoking Cessation This document explains the best ways for you to quit smoking and new treatments to help. It lists new medicines that can double or triple your chances of quitting and quitting for good. It also considers ways to avoid relapses and concerns you may have about quitting, including weight gain. NICOTINE: A POWERFUL ADDICTION If you have tried to quit smoking, you know how hard it can be. It is hard because nicotine is a very addictive drug. For some people, it can be as addictive as heroin or cocaine. Usually, people make 2 or 3 tries, or more, before finally being able to quit. Each time you try to quit, you can learn about what helps and what hurts. Quitting takes hard work and a lot of effort, but you can quit smoking. QUITTING SMOKING IS ONE OF THE MOST IMPORTANT THINGS YOU WILL EVER DO:  You will live longer, feel better, and live better.   The impact on your body of quitting smoking is felt almost immediately:   Within 20 minutes, blood pressure decreases. Pulse returns to its normal level.   After 8 hours, carbon monoxide levels in the blood return to normal. Oxygen level  increases.   After 24 hours, chance of heart attack starts to decrease. Breath, hair, and body stop smelling like smoke.   After 48 hours, damaged nerve endings begin to recover. Sense of taste and smell improve.   After 72 hours, the body is virtually free of nicotine. Bronchial tubes relax and breathing becomes easier.   After 2 to 12 weeks, lungs can hold more air. Exercise becomes easier and circulation improves.   Quitting will lower your chance of having a heart attack, stroke, cancer, or lung disease:   After 1 year, the risk of coronary heart disease is cut in half.   After 5 years, the risk of stroke falls to the same as a nonsmoker.   After 10 years, the risk of lung cancer is cut in half and the risk of other cancers decreases significantly.   After 15 years, the risk of coronary heart disease drops, usually to the level of a nonsmoker.   If you are pregnant, quitting smoking will improve your chances of having a healthy baby.   The people you live with, especially your children, will be healthier.   You will have extra money to spend on things other than cigarettes.  FIVE KEYS TO QUITTING Studies have shown that these 5 steps will help you quit smoking and quit for good. You have the best chances of quitting if you use them together: 1. Get ready.  2. Get support and encouragement.  3. Learn new skills and behaviors.  4. Get medicine to reduce your nicotine addiction and use it correctly.  5. Be prepared for relapse or difficult situations. Be determined to continue trying to quit, even if you do not succeed at first.  1. GET READY  Set a quit date.   Change your environment.   Get rid of ALL cigarettes, ashtrays, matches, and lighters in your home, car, and place of work.   Do not let people smoke in your home.   Review your past attempts to quit. Think about what worked and what did not.   Once you quit, do not smoke. NOT EVEN A PUFF!  2. GET SUPPORT AND  ENCOURAGEMENT Studies have shown that you have a better chance of being successful if you have help. You can get support in many ways.  Tell your family, friends, and coworkers that you are going to quit and need their support. Ask them not to smoke around you.   Talk to your caregivers (doctor, dentist, nurse, pharmacist, psychologist, and/or smoking counselor).   Get individual, group, or telephone counseling and support. The more counseling you have, the better your chances are of quitting. Programs are available at [employer] and health centers. Call your local health department for information about programs in your area.   Spiritual beliefs and practices may help some smokers quit.   Quit meters are Photographer that keep track of quit statistics, such as amount of "quit-time," cigarettes not smoked, and money saved.   Many smokers find one or more of the many self-help books available useful in helping them quit and stay off tobacco.  3. LEARN NEW SKILLS AND BEHAVIORS  Try to distract yourself from urges to smoke. Talk to someone, go for a walk, or occupy your time with a task.   When you first try to quit, change your routine. Take a different route to work. Drink tea instead of coffee. Eat breakfast in a different place.   Do something to reduce your stress. Take a hot bath, exercise, or read a book.   Plan something enjoyable to do every day. Reward yourself for not smoking.   Explore interactive web-based programs that specialize in helping you quit.  4. GET MEDICINE AND USE IT CORRECTLY Medicines can help you stop smoking and decrease the urge to smoke. Combining medicine with the above behavioral methods and support can quadruple your chances of successfully quitting smoking. The [place] Food and Drug Administration (FDA) has approved 7 medicines to help you quit smoking. These medicines fall into 3 categories.  Nicotine replacement  therapy (delivers nicotine to your body without the negative effects and risks of smoking):   Nicotine gum: Available over-the-counter.   Nicotine lozenges: Available over-the-counter.   Nicotine inhaler: Available by prescription.   Nicotine nasal spray: Available by prescription.   Nicotine skin patches (transdermal): Available by prescription and over-the-counter.   Antidepressant medicine (helps people abstain from smoking, but how this works is unknown):   Bupropion sustained-release (SR) tablets: Available by prescription.   Nicotinic receptor partial agonist (simulates the effect of nicotine in your brain):   Varenicline tartrate tablets: Available by prescription.   Ask your caregiver for advice about which medicines to use and how to use them. Carefully read the information on the package.   Everyone who is trying to quit may benefit from using a medicine. If you are pregnant or trying to become pregnant, nursing an infant, you  are under age 19, or you smoke fewer than 10 cigarettes per day, talk to your caregiver before taking any nicotine replacement medicines.   You should stop using a nicotine replacement product and call your caregiver if you experience nausea, dizziness, weakness, vomiting, fast or irregular heartbeat, mouth problems with the lozenge or gum, or redness or swelling of the skin around the patch that does not go away.   Do not use any other product containing nicotine while using a nicotine replacement product.   Talk to your caregiver before using these products if you have diabetes, heart disease, asthma, stomach ulcers, you had a recent heart attack, you have high blood pressure that is not controlled with medicine, a history of irregular heartbeat, or you have been prescribed medicine to help you quit smoking.  5. BE PREPARED FOR RELAPSE OR DIFFICULT SITUATIONS  Most relapses occur within the first 3 months after quitting. Do not be discouraged if you  start smoking again. Remember, most people try several times before they finally quit.   You may have symptoms of withdrawal because your body is used to nicotine. You may crave cigarettes, be irritable, feel very hungry, cough often, get headaches, or have difficulty concentrating.   The withdrawal symptoms are only temporary. They are strongest when you first quit, but they will go away within 10 to 14 days.  Here are some difficult situations to watch for:  Alcohol. Avoid drinking alcohol. Drinking lowers your chances of successfully quitting.   Caffeine. Try to reduce the amount of caffeine you consume. It also lowers your chances of successfully quitting.   Other smokers. Being around smoking can make you want to smoke. Avoid smokers.   Weight gain. Many smokers will gain weight when they quit, usually less than 10 pounds. Eat a healthy diet and stay active. Do not let weight gain distract you from your main goal, quitting smoking. Some medicines that help you quit smoking may also help delay weight gain. You can always lose the weight gained after you quit.   Bad mood or depression. There are a lot of ways to improve your mood other than smoking.  If you are having problems with any of these situations, talk to your caregiver. SPECIAL SITUATIONS OR CONDITIONS Studies suggest that everyone can quit smoking. Your situation or condition can give you a special reason to quit.  Pregnant women/New mothers: By quitting, you protect your baby's health and your own.   Hospitalized patients: By quitting, you reduce health problems and help healing.   Heart attack patients: By quitting, you reduce your risk of a second heart attack.   Lung, head, and neck cancer patients: By quitting, you reduce your chance of a second cancer.   Parents of children and adolescents: By quitting, you protect your children from illnesses caused by secondhand smoke.  QUESTIONS TO THINK ABOUT Think about the  following questions before you try to stop smoking. You may want to talk about your answers with your caregiver.  Why do you want to quit?   If you tried to quit in the past, what helped and what did not?   What will be the most difficult situations for you after you quit? How will you plan to handle them?   Who can help you through the tough times? Your family? Friends? Caregiver?   What pleasures do you get from smoking? What ways can you still get pleasure if you quit?  Here are some questions to ask  your caregiver:  How can you help me to be successful at quitting?   What medicine do you think would be best for me and how should I take it?   What should I do if I need more help?   What is smoking withdrawal like? How can I get information on withdrawal?  Quitting takes hard work and a lot of effort, but you can quit smoking. FOR MORE INFORMATION Smokefree.gov (http://www.davis-sullivan.com/) provides free, accurate, evidence-based information and professional assistance to help support the immediate and long-term needs of people trying to quit smoking. Document Released: 07/19/2001 Document Re-Released: 01/12/2010 [facility] Patient Information 2011 [place], [place].

## 2011-03-07 NOTE — Progress Notes (Signed)
Follow-up Visit, Pain Med Refill  HPI The patient is a 55 yo man with a history of HTN, HL, PVD, and chronic back pain, presenting for a follow-up visit and an early medication refill.  The patient has a history of chronic lumbar back pain (see MRI read below) and bilateral leg pain due to peripheral vascular disease, currently managed with Norco 10-325.  The patient was changed from Percocet 5-325 to Norco 10-325 on 11/18/09.  At his follow-up visit 12/15/10, the patient's pain was well-controlled with Norco.  This month, however, the patient notes that he has been under increased stress in his life, and has been using more than his allotted medication, and as such has run out after 23 days of his 30-day prescription.  Of note, the patient has asked for early refills of his medications in the past.  The patient also takes clonazepam, and has run out of this prescription early, stating that he takes an extra 1-2 pills at night when he has trouble sleeping, which has been often lately.  The patient also notes that he has been trying to quit smoking, and that his quit date was yesterday.  He has quit for periods of time in the past, the longest of which has been 3 years.  He has tried wellbutrin recently, with no success.  He is interested in trying Chantix, but believes the cost of the medication may be prohibitively high.  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 FORAMINAL NARROWING ON THE RIGHT AT L3-4 APPEARS TO  HAVE INCREASED SLIGHTLY SINCE 02/15/01.  PEX General: alert, cooperative, and in no apparent distress, though appears fearful about missing the next few days of his pain medication HEENT: pupils equal round and reactive to light, vision grossly intact, oropharynx clear and non-erythematous  Neck: supple, no lymphadenopathy, JVD, or carotid bruits Lungs: clear to ascultation bilaterally, normal work of respiration, no wheezes, rales, ronchi Heart: regular rate and rhythm, no murmurs, gallops, or rubs Abdomen: soft, non-tender, non-distended, normal bowel sounds Msk: no joint edema, warmth, or erythema Extremities: no cyanosis, clubbing, or edema, though multiple scars noted on legs bilaterally from prior arterial bypass surgeries Neurologic: alert & oriented X3, cranial nerves II-XII intact, strength grossly intact, sensation intact to light touch  Assessment/Plan

## 2011-03-08 NOTE — Progress Notes (Signed)
Soc. Work.  Brief visit with patient to introduce myself and offer smoking cessation counseling.  The patient said he had been here for over two hours and ride was waiting.  I gave him tip sheet to read as homework and will call him for more extensive smoking cessation counseling.

## 2011-03-08 NOTE — Progress Notes (Signed)
I agree with assessment and plan as per Dr. Brown. 

## 2011-04-07 ENCOUNTER — Encounter: Payer: Self-pay | Admitting: Physician Assistant

## 2011-04-08 ENCOUNTER — Encounter: Payer: Self-pay | Admitting: Physician Assistant

## 2011-04-13 ENCOUNTER — Encounter: Payer: Self-pay | Admitting: Physician Assistant

## 2011-04-14 ENCOUNTER — Encounter (INDEPENDENT_AMBULATORY_CARE_PROVIDER_SITE_OTHER): Payer: PRIVATE HEALTH INSURANCE | Admitting: *Deleted

## 2011-04-14 ENCOUNTER — Ambulatory Visit (INDEPENDENT_AMBULATORY_CARE_PROVIDER_SITE_OTHER): Payer: PRIVATE HEALTH INSURANCE | Admitting: Physician Assistant

## 2011-04-14 VITALS — BP 127/71 | HR 63 | Resp 18

## 2011-04-14 DIAGNOSIS — I739 Peripheral vascular disease, unspecified: Secondary | ICD-10-CM

## 2011-04-14 DIAGNOSIS — I7092 Chronic total occlusion of artery of the extremities: Secondary | ICD-10-CM

## 2011-04-14 DIAGNOSIS — Z48812 Encounter for surgical aftercare following surgery on the circulatory system: Secondary | ICD-10-CM
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
## 2011-04-15 ENCOUNTER — Other Ambulatory Visit: Payer: Self-pay | Admitting: Vascular Surgery

## 2011-04-15 DIAGNOSIS — I739 Peripheral vascular disease, unspecified: Secondary | ICD-10-CM

## 2011-04-18 ENCOUNTER — Ambulatory Visit (HOSPITAL_COMMUNITY)
Admission: RE | Admit: 2011-04-18 | Discharge: 2011-04-18 | Disposition: A | Discharge status: Home / Self Care | Payer: Self-pay | Source: Ambulatory Visit | Attending: Vascular Surgery | Admitting: Vascular Surgery

## 2011-04-18 DIAGNOSIS — I739 Peripheral vascular disease, unspecified: Secondary | ICD-10-CM | POA: Insufficient documentation

## 2011-04-18 DIAGNOSIS — I70209 Unspecified atherosclerosis of native arteries of extremities, unspecified extremity: Secondary | ICD-10-CM | POA: Insufficient documentation

## 2011-04-18 MED ORDER — IOHEXOL 350 MG/ML SOLN
150.0000 mL | Freq: Once | INTRAVENOUS | Status: AC | PRN
  Administered 2011-04-18: 150 mL via INTRAVENOUS

## 2011-04-19 ENCOUNTER — Encounter: Payer: Self-pay | Admitting: Vascular Surgery

## 2011-04-20 ENCOUNTER — Ambulatory Visit (INDEPENDENT_AMBULATORY_CARE_PROVIDER_SITE_OTHER): Payer: Self-pay | Admitting: Vascular Surgery

## 2011-04-20 ENCOUNTER — Encounter: Payer: Self-pay | Admitting: Vascular Surgery

## 2011-04-20 VITALS — BP 111/76 | HR 73 | Resp 12 | Ht 72.0 in | Wt 160.0 lb

## 2011-04-20 DIAGNOSIS — I70219 Atherosclerosis of native arteries of extremities with intermittent claudication, unspecified extremity: Secondary | ICD-10-CM

## 2011-04-20 DIAGNOSIS — I739 Peripheral vascular disease, unspecified: Secondary | ICD-10-CM

## 2011-04-20 NOTE — Procedures (Unsigned)
BYPASS GRAFT EVALUATION  INDICATION:  Follow up left fem-peroneal graft; increasing bilateral claudication.  HISTORY: Diabetes:  Yes. Cardiac: Hypertension:  Yes. Smoking:  Yes. Previous Surgery:  Aortobifemoral graft, left femoral-peroneal graft, both placed 2003.  SINGLE LEVEL ARTERIAL EXAM                              RIGHT              LEFT Brachial: Anterior tibial: Posterior tibial: Peroneal: Ankle/brachial index:  PREVIOUS ABI:  Date:  RIGHT:  LEFT:  LOWER EXTREMITY BYPASS GRAFT DUPLEX EXAM:  DUPLEX: 1. Left femoral-peroneal graft is patent; however, velocities are less     than 40 cm/s and may preclude graft failure. 2. In the mid thigh segment, there is an area of significant stenosis     with velocities of  > 530 cm/s. 3. The inflow of the graft, which is the left limb of the     aortobifemoral graft is monophasic and may indicate a more proximal     stenosis. 4. Distal anastomosis is difficult to visualize, but there appears to     be a dilation which is likely a valve sinus.  IMPRESSION: 1. Patent left femoral-peroneal graft with >75% stenosis in the mid     segment. 2. In view of findings described above, further imaging is suggested.  ___________________________________________ Di Kindle. Edilia Bo, M.D.  LT/MEDQ  D:  04/14/2011  T:  04/14/2011  Job:  [id number]

## 2011-04-20 NOTE — Progress Notes (Signed)
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 male who underwent an aortobifemoral bypass graft in June of 2003 by Dr. Madilyn Fireman. He subsequently had a left femoral to peroneal artery bypass graft with a vein graft in November of 2003. A followup duplex scan he was noted have a vein graft stenosis in the bypass in the left leg. He was evaluated by Newton Pigg and set up for CT angiogram he comes in today to discuss these results.  He denies any significant claudication in either leg. He's had no rest pain and no history of nonhealing ulcers.  Past Medical History  Diagnosis Date  . Tobacco abuse   . 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 Manson Passey).  Pt will likely require a wide resection and  has been refered to ENT (2/12)  . Dental caries   . Glucose intolerance (impaired glucose tolerance)     Max random CBG 130 (08/29/07) typically about 100. A1C in 11/08 was 5.4.  . Peripheral vascular disease   . Hypertension, essential   . Dyslipidemia   . Anxiety   . Thoracic kyphosis   . Back pain      The patient has chronic lumbar/thoracic back pain, he has kyphosis, mild facet hypertrophy at L1-L2, circumferential annular bulging and mild vertebral body osteophytic formation at L2-L3, mild to moderate stenosis of the medial aspect of the neural foramen at L4-L5, and mild annular bulging at L5-S1.  Marland Kitchen History of alcohol abuse   . Diabetes mellitus   . Hyperlipidemia   . Peripheral arterial disease   . Leg pain   . Back pain   . Ulcer     FAMILY HISTORY: Family History  Problem Relation Age of Onset  . Heart disease Mother   . Cancer Mother   . Alcohol abuse Father   . Heart disease Father   . Stroke Father     SOCIAL HISTORY: History  Substance Use Topics  . Smoking status: Current Everyday Smoker -- 1.0 packs/day for 40 years    Types: Cigarettes  .  Smokeless tobacco: Not on file   Comment: trying to quit  . Alcohol Use: No    No Known Allergies  Current Outpatient Prescriptions  Medication Sig Dispense Refill  . aspirin 81 MG chewable tablet Chew 1 tablet (81 mg total) by mouth daily.  30 tablet  6  . clonazePAM (KLONOPIN) 1 MG tablet Take 1 tablet (1 mg total) by mouth 3 (three) times daily as needed for anxiety.  90 tablet  0  . enalapril (VASOTEC) 10 MG tablet Take 1 tablet (10 mg total) by mouth daily.  30 tablet  6  . hydrochlorothiazide 25 MG tablet Take 1 tablet (25 mg total) by mouth daily.  30 tablet  6  . HYDROcodone-acetaminophen (NORCO) 10-325 MG per tablet Take 1 tablet by mouth every 4 (four) hours as needed.  150 tablet  1  . metoprolol (LOPRESSOR) 50 MG tablet Take 1 tablet (50 mg total) by mouth 2 (two) times daily.  60 tablet  6  . omeprazole (PRILOSEC) 20 MG capsule Take 1 capsule (20 mg total) by mouth daily.  30 capsule  6  . sertraline (ZOLOFT) 50 MG tablet Take 2 tablets (100 mg total) by mouth every morning.  60 tablet  6  . simvastatin (ZOCOR) 40 MG  tablet Take 1 tablet (40 mg total) by mouth daily.  30 tablet  6  . traMADol (ULTRAM) 50 MG tablet Take 1 tablet (50 mg total) by mouth every 6 (six) hours as needed for pain.  120 tablet  5  . traZODone (DESYREL) 50 MG tablet Take 1-2 tablets (50-100 mg total) by mouth at bedtime.  60 tablet  2  . Vitamins-Lipotropics (B COMPLEX FORMULA 1 PO) Take 1 tablet by mouth daily.          REVIEW OF SYSTEMS: CARDIOVASCULAR:  Arly.Keller ] chest pain, this has been mild and has been stable.   [ ]  chest pressure   [ ]  palpitations   [ ]  orthopnea   [ ]  dyspnea on exertion   [ ]  claudication   [ ]  rest pain   [ ]  DVT   [ ]  phlebitis PULMONARY:   [ ]  productive cough   [ ]  asthma   [ ]  wheezing NEUROLOGIC:   [ ]  weakness  [ ]  paresthesias  [ ]  aphasia  [ ]  amaurosis  [ ]  dizziness HEMATOLOGIC:   [ ]  bleeding problems   [ ]  clotting disorders MUSCULOSKELETAL:  [ ]  joint pain   [ ]   joint swelling GASTROINTESTINAL: [ ]   blood in stool  [ ]   hematemesis GENITOURINARY:  [ ]   dysuria  [ ]   hematuria PSYCHIATRIC:  [ ]  history of major depression INTEGUMENTARY:  [ ]  rashes  [ ]  ulcers CONSTITUTIONAL:  [ ]  fever   [ ]  chills  PHYSICAL EXAM: Filed Vitals:   04/20/11 0854  BP: 111/76  Pulse: 73  Resp: 12   Body mass index is 21.70 kg/(m^2).  GENERAL: The patient appears their stated age. The vital signs are documented above. CARDIOVASCULAR: There is a regular rate and rhythm without significant murmur appreciated. I do not detect any carotid bruits. Has palpable femoral pulses. Cannot palpate pedal pulses although both feet are warm and well-perfused. PULMONARY: There is good air exchange bilaterally without wheezing or rales. ABDOMEN: Soft and non-tender with normal pitched bowel sounds.  MUSCULOSKELETAL: There are no major deformities or cyanosis. NEUROLOGIC: No focal weakness or paresthesias are detected. SKIN: There are no ulcers or rashes noted. PSYCHIATRIC: The patient has a normal affect.  DATA: 1. I have reviewed his CT angiogram which shows that his aortobifemoral bypass graft is widely patent. Likewise his left lower extremity bypass graft is patent and it is difficult to visualize the stenosis that was seen on duplex. 2. I have reviewed his graft duplex that was done in our office on 04/14/2011. His area of increased velocities in the mid thigh with velocities as high as 530 cm/s. His ABI on 04/14/2011 was 76% which had dropped from 100% when I compared this study from December of 2010.   MEDICAL ISSUES: 1. this patient has a vein graft stenosis in his left them peroneal artery bypass graft. He has had a drop in ABI. I've explained that without a dressing is the risk is that his graft could go on to thrombose which becomes a much more difficult issue to solve and could also become a limb threatening problem. For this reason I recommended that we proceed with  arteriography to further evaluate the stenosis. If the stenosis is amenable to angioplasty this could potentially be done at the same time.I have reviewed with the patient the indications for arteriography. In addition, I have reviewed the potential complications of arteriography including but not limited to:  Bleeding, arterial injury, arterial thrombosis, dye action, renal insufficiency, or other unpredictable medical problems. I have explained to the patient that if we find disease amenable to angioplasty we could potentially address this at the same time. I have discussed the potential complications of angioplasty and stenting, including but not limited to: Bleeding, arterial thrombosis, arterial injury, dissection, or the need for surgical intervention. This procedure is scheduled for 05/02/2011. We will make further recommendations pending these results.

## 2011-04-29 ENCOUNTER — Other Ambulatory Visit (HOSPITAL_COMMUNITY): Payer: Self-pay

## 2011-05-03 ENCOUNTER — Other Ambulatory Visit: Payer: Self-pay | Admitting: *Deleted

## 2011-05-03 MED ORDER — CLONAZEPAM 1 MG PO TABS: 1.0000 mg | ORAL_TABLET | Freq: Three times a day (TID) | ORAL | Status: DC | PRN

## 2011-05-03 MED ORDER — HYDROCODONE-ACETAMINOPHEN 10-325 MG PO TABS: 1.0000 | ORAL_TABLET | ORAL | Status: DC | PRN

## 2011-05-03 NOTE — Telephone Encounter (Signed)
Last refill of norco 8/28

## 2011-05-04 NOTE — Telephone Encounter (Signed)
Rx called in 

## 2011-05-09 ENCOUNTER — Ambulatory Visit (HOSPITAL_COMMUNITY)
Admission: RE | Admit: 2011-05-09 | Discharge: 2011-05-09 | Disposition: A | Discharge status: Home / Self Care | Payer: Self-pay | Source: Ambulatory Visit | Attending: Vascular Surgery | Admitting: Vascular Surgery

## 2011-05-09 ENCOUNTER — Ambulatory Visit (HOSPITAL_COMMUNITY): Payer: Self-pay

## 2011-05-09 ENCOUNTER — Other Ambulatory Visit (HOSPITAL_COMMUNITY): Payer: Self-pay

## 2011-05-09 DIAGNOSIS — I739 Peripheral vascular disease, unspecified: Secondary | ICD-10-CM

## 2011-05-09 DIAGNOSIS — I70409 Unspecified atherosclerosis of autologous vein bypass graft(s) of the extremities, unspecified extremity: Secondary | ICD-10-CM | POA: Insufficient documentation

## 2011-05-09 LAB — COMPREHENSIVE METABOLIC PANEL
CO2: 28 mEq/L (ref 19–32)
Calcium: 9.1 mg/dL (ref 8.4–10.5)
Creatinine, Ser: 0.8 mg/dL (ref 0.50–1.35)
GFR calc Af Amer: >90 mL/min (ref >90)
GFR calc non Af Amer: >90 mL/min (ref >90)
Glucose, Bld: 124 mg/dL — ABNORMAL HIGH (ref 70–99)
Sodium: 137 mEq/L (ref 135–145)
Total Protein: 6 g/dL (ref 6.0–8.3)

## 2011-05-09 LAB — SURGICAL PCR SCREEN: MRSA, PCR: NEGATIVE

## 2011-05-09 LAB — POCT I-STAT, CHEM 8
BUN: 9 mg/dL (ref 6–23)
Calcium, Ion: 1.17 mmol/L (ref 1.12–1.32)
Hemoglobin: 13.9 g/dL (ref 13.0–17.0)
TCO2: 25 mmol/L (ref 0–100)

## 2011-05-09 LAB — CBC
Hemoglobin: 12.8 g/dL — ABNORMAL LOW (ref 13.0–17.0)
MCH: 29.4 pg (ref 26.0–34.0)
MCV: 86 fL (ref 78.0–100.0)
Platelets: 183 10*3/uL (ref 150–400)
RBC: 4.35 MIL/uL (ref 4.22–5.81)

## 2011-05-09 LAB — GLUCOSE, CAPILLARY: Glucose-Capillary: 134 mg/dL — ABNORMAL HIGH (ref 70–99)

## 2011-05-10 NOTE — Op Note (Signed)
[name], Andrew [name]               ACCOUNT NO.:  [id number]  MEDICAL RECORD NO.:  [id number]  LOCATION:  [facility]                         FACILITY:  [facility]  PHYSICIAN:  Di Kindle. 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  PROCEDURES: 1. Ultrasound-guided access to right limb of aortofemoral bypass     graft. 2. Aortogram with bilateral lower extremity runoff.  SURGEON:  Di Kindle. Edilia Bo, MD  ANESTHESIA:  Local with sedation.  TECHNIQUE:  The patient was taken to the PV Lab and received a milligram of Versed and 50 mcg of fentanyl.  Both groins were prepped and draped in usual sterile fashion.  After the skin was infiltrated with 1% lidocaine and under ultrasound guidance, the right limb of the aortobifemoral bypass graft was cannulated and a guidewire introduced into the aorta under fluoroscopic control.  The 5-French sheath was introduced over the wire.  Pigtail catheter was positioned at the L1 vertebral body and flush aortogram obtained.  The catheter was then positioned above the aortic bifurcation and bilateral lower extremity runoff films were obtained.  Of note, I had also obtained a lateral projection of the wound.  He was found to have a 95% stenosis in the fem- peroneal bypass graft.  Because of the configuration of his aortofemoral graft, this could not be accessed from the right side, so I attempted antegrade cannulation of the fem-peroneal graft through the left limb of the aortofemoral graft using a micropuncture needle.  However, the wire would not pass into the graft and would only go into the deep femoral artery.  Therefore, I was unable to do an antegrade cannulation  for potentially addressing the vein graft stenosis with an endovascular approach.  FINDINGS:  Renal arteries are patent bilaterally.  The aortofemoral bypass graft is widely patent.  On the left side, the common femoral and deep femoral arteries are patent.  The superficial femoral artery is occluded at its origin.  The fem-peroneal artery bypass graft is patent and is anastomosed to the peroneal artery on the left.  There is a 95% focal stenosis in the mid thigh on the left.  There is peroneal runoff only with reconstitution of the distal posterior tibial artery on the left.  On the right side, the common femoral and deep femoral arteries are patent.  The superficial femoral artery is occluded at its origin. There is reconstitution of the popliteal artery at the level of the knee with single-vessel runoff on the right via the peroneal artery.  The posterior tibial artery and anterior tibial arteries are occluded. There is some reconstitution a very distal posterior tibial artery on the right.  CONCLUSIONS: 1. Patent fem-peroneal artery bypass graft on the left with a vein     graft stenosis as described above. 2. Right superficial femoral artery occlusion with peroneal runoff     only on the right also. 3. Patent aortofemoral bypass graft.     Di Kindle.  Edilia Bo, M.D.     CSD/MEDQ  D:  05/09/2011  T:  05/09/2011  Job:  [id number]  Electronically Signed by [name] M.D. on 05/10/2011 01:34:40 PM

## 2011-05-11 LAB — TYPE AND SCREEN
ABO/RH(D): O POS
Antibody Screen: NEGATIVE

## 2011-05-12 ENCOUNTER — Inpatient Hospital Stay (HOSPITAL_COMMUNITY)
Admission: RE | Admit: 2011-05-12 | Discharge: 2011-05-13 | DRG: 254 | Disposition: A | Discharge status: Home / Self Care | Payer: Medicare Other | Source: Ambulatory Visit | Attending: Vascular Surgery | Admitting: Vascular Surgery

## 2011-05-12 DIAGNOSIS — K029 Dental caries, unspecified: Secondary | ICD-10-CM | POA: Diagnosis present

## 2011-05-12 DIAGNOSIS — E119 Type 2 diabetes mellitus without complications: Secondary | ICD-10-CM | POA: Diagnosis present

## 2011-05-12 DIAGNOSIS — M549 Dorsalgia, unspecified: Secondary | ICD-10-CM | POA: Diagnosis present

## 2011-05-12 DIAGNOSIS — F17200 Nicotine dependence, unspecified, uncomplicated: Secondary | ICD-10-CM | POA: Diagnosis present

## 2011-05-12 DIAGNOSIS — F411 Generalized anxiety disorder: Secondary | ICD-10-CM | POA: Diagnosis present

## 2011-05-12 DIAGNOSIS — M4000 Postural kyphosis, site unspecified: Secondary | ICD-10-CM | POA: Diagnosis present

## 2011-05-12 DIAGNOSIS — G609 Hereditary and idiopathic neuropathy, unspecified: Secondary | ICD-10-CM | POA: Diagnosis present

## 2011-05-12 DIAGNOSIS — I70409 Unspecified atherosclerosis of autologous vein bypass graft(s) of the extremities, unspecified extremity: Principal | ICD-10-CM | POA: Diagnosis present

## 2011-05-12 DIAGNOSIS — K219 Gastro-esophageal reflux disease without esophagitis: Secondary | ICD-10-CM | POA: Diagnosis present

## 2011-05-12 DIAGNOSIS — I10 Essential (primary) hypertension: Secondary | ICD-10-CM | POA: Diagnosis present

## 2011-05-12 DIAGNOSIS — E785 Hyperlipidemia, unspecified: Secondary | ICD-10-CM | POA: Diagnosis present

## 2011-05-12 DIAGNOSIS — C4402 Squamous cell carcinoma of skin of lip: Secondary | ICD-10-CM | POA: Diagnosis present

## 2011-05-12 HISTORY — PX: PR VEIN BYPASS GRAFT,AORTO-FEM-POP: 35551

## 2011-05-12 LAB — GLUCOSE, CAPILLARY
Glucose-Capillary: 114 mg/dL — ABNORMAL HIGH (ref 70–99)
Glucose-Capillary: 238 mg/dL — ABNORMAL HIGH (ref 70–99)

## 2011-05-12 LAB — HEMOGLOBIN A1C
Hgb A1c MFr Bld: 5.7 % — ABNORMAL HIGH (ref <5.7)
Mean Plasma Glucose: 117 mg/dL — ABNORMAL HIGH (ref <117)

## 2011-05-13 DIAGNOSIS — Z48812 Encounter for surgical aftercare following surgery on the circulatory system: Secondary | ICD-10-CM

## 2011-05-13 LAB — CBC
HCT: 36.5 % — ABNORMAL LOW (ref 39.0–52.0)
MCH: 29.1 pg (ref 26.0–34.0)
MCHC: 34.2 g/dL (ref 30.0–36.0)
MCV: 85.1 fL (ref 78.0–100.0)
RDW: 13.4 % (ref 11.5–15.5)
WBC: 10.8 10*3/uL — ABNORMAL HIGH (ref 4.0–10.5)

## 2011-05-13 LAB — BASIC METABOLIC PANEL
BUN: 7 mg/dL (ref 6–23)
Calcium: 9.5 mg/dL (ref 8.4–10.5)
Chloride: 101 mEq/L (ref 96–112)
Creatinine, Ser: 0.7 mg/dL (ref 0.50–1.35)
GFR calc Af Amer: >90 mL/min (ref >90)

## 2011-05-13 NOTE — Op Note (Signed)
Andrew [name], Andrew [name]               ACCOUNT NO.:  [id number]  MEDICAL RECORD NO.:  [id number]  LOCATION:  [id number]                         FACILITY:  [facility]  PHYSICIAN:  Di Kindle. 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  PROCEDURE:  Repair of vein graft stenosis of left femoral-peroneal bypass graft with saphenous vein patch.  SURGEON:  Di Kindle. Edilia Bo, MD  ASSISTANT:  Herbie Drape, RNFA  ANESTHESIA:  General.  INDICATIONS:  This is a 55 year old gentleman who had undergone previous aortofemoral bypass grafting and left fem-peroneal bypass grafting in 2003.  On a routine followup study, he was noted have markedly increased velocities in a segment of his proximal vein graft.  Arteriogram was obtained which demonstrated the focal stenosis.  Because of his aortic graft, he was not a candidate to have an endovascular approach from the right side.  I did attempt to antegrade cannulate the left limb of his graft but was unable to get into the vein graft.  For this reason, I felt that surgical repair was indicated.  TECHNIQUE:  The patient was taken to the operating room and using the ultrasound scanner, I did identify a segment of vein in the calf which appeared to be usable.  The left leg was prepped and draped in usual sterile fashion after the patient had received the general anesthetic. A longitudinal incision was made just anterior to the sartorius overlying the area where the vein graft stenosis had been identified. The sartorius was retracted posteriorly and the bypass graft was easily exposed.  Using the Doppler, I was able to locate the area of stenosis. Next, a separate longitudinal incision was made  over the saphenous vein and the calf and a segment of vein was harvested for approximately 5 cm. The patient was then heparinized.  The saphenous vein graft was then clamped proximal and distal to the stenosis and this was opened longitudinally.  There was a focal stenosis and there was some diffuse mild atherosclerotic disease throughout the vein but the area of stenosis appeared to be some intimal hyperplasia.  I debrided this some but there was no.  I did fairly endarterectomized this area.  The vein was harvested, opened longitudinally, and then sewn as a vein patch using continuous 6-0 Prolene suture.  Prior to completing the closure, the graft was back-bled and flushed appropriately and then the vein patch closure completed.  At the completion, there was an excellent Doppler signal in the graft and a good peroneal signal with the Doppler. Heparin was reversed with protamine.  The wounds were then closed with a deep layer of 3-0 Vicryl and the skin closed with 4-0 Vicryl.  A sterile dressing was applied.  The patient tolerated the procedure well and was transferred to the recovery room in stable condition.  All needle and sponge counts were correct.     Di Kindle. Edilia Bo, M.D.     CSD/MEDQ  D:  05/12/2011  T:  05/12/2011  Job:  [id number]  Electronically Signed by [name] M.D. on 05/13/2011 08:34:49 PM

## 2011-06-07 ENCOUNTER — Encounter: Payer: Self-pay | Admitting: Vascular Surgery

## 2011-06-08 ENCOUNTER — Encounter: Payer: Self-pay | Admitting: Vascular Surgery

## 2011-06-08 ENCOUNTER — Ambulatory Visit (INDEPENDENT_AMBULATORY_CARE_PROVIDER_SITE_OTHER): Payer: Medicare Other | Admitting: Vascular Surgery

## 2011-06-08 VITALS — BP 111/75 | HR 73 | Temp 98.7°F | Ht 72.0 in | Wt 166.5 lb

## 2011-06-08 DIAGNOSIS — Z9889 Other specified postprocedural states: Secondary | ICD-10-CM

## 2011-06-08 DIAGNOSIS — I70219 Atherosclerosis of native arteries of extremities with intermittent claudication, unspecified extremity: Secondary | ICD-10-CM

## 2011-06-08 DIAGNOSIS — Z95828 Presence of other vascular implants and grafts: Secondary | ICD-10-CM

## 2011-06-08 NOTE — Progress Notes (Signed)
[facility]  Patient name: Andrew [name] MRN: [id number] DOB: 04/20/1956 Sex: male  CC: followup after revision of them tibial bypass graft  HPI: Andrew [name] is a 55 y.o. male who underwent an aortofemoral bypass graft and left fem peroneal bypass graft in 2003 by Dr. Liliane Bade. He presented with a focal stenosis within his vein graft on the left leg. He underwent repair of the vein graft stenosis of left fem peroneal artery bypass graft with a saphenous vein patch on 05/12/2011. He comes in for his first outpatient visit. His had no claudication or rest pain in the left leg.  Past Medical History  Diagnosis Date  . Tobacco abuse   . 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 Manson Passey).  Pt will likely require a wide resection and  has been refered to ENT (2/12)  . Dental caries   . Glucose intolerance (impaired glucose tolerance)     Max random CBG 130 (08/29/07) typically about 100. A1C in 11/08 was 5.4.  . Peripheral vascular disease   . Hypertension, essential   . Dyslipidemia   . Anxiety   . Thoracic kyphosis   . Back pain      The patient has chronic lumbar/thoracic back pain, he has kyphosis, mild facet hypertrophy at L1-L2, circumferential annular bulging and mild vertebral body osteophytic formation at L2-L3, mild to moderate stenosis of the medial aspect of the neural foramen at L4-L5, and mild annular bulging at L5-S1.  Marland Kitchen History of alcohol abuse   . Diabetes mellitus   . Hyperlipidemia   . Peripheral arterial disease   . Leg pain   . Back pain   . Ulcer     Family History  Problem Relation Age of Onset  . Heart disease Mother   . Cancer Mother   . Alcohol abuse Father   . Heart disease Father   . Stroke Father     SOCIAL HISTORY: History  Substance Use Topics  . Smoking status: Current Everyday Smoker -- 0.2 packs/day for 40 years    Types: Cigarettes   . Smokeless tobacco: Not on file   Comment: trying to quit  . Alcohol Use: No    No Known Allergies  Current Outpatient Prescriptions  Medication Sig Dispense Refill  . aspirin 81 MG chewable tablet Chew 1 tablet (81 mg total) by mouth daily.  30 tablet  6  . clonazePAM (KLONOPIN) 1 MG tablet Take 1 tablet (1 mg total) by mouth 3 (three) times daily as needed for anxiety.  90 tablet  1  . enalapril (VASOTEC) 10 MG tablet Take 1 tablet (10 mg total) by mouth daily.  30 tablet  6  . hydrochlorothiazide 25 MG tablet Take 1 tablet (25 mg total) by mouth daily.  30 tablet  6  . HYDROcodone-acetaminophen (NORCO) 10-325 MG per tablet Take 1 tablet by mouth every 4 (four) hours as needed.  150 tablet  1  . metoprolol (LOPRESSOR) 50 MG tablet Take 1 tablet (50 mg total) by mouth 2 (two) times daily.  60 tablet  6  . omeprazole (PRILOSEC) 20 MG capsule Take 1 capsule (20 mg total) by mouth daily.  30 capsule  6  . sertraline (ZOLOFT) 50 MG tablet Take 2 tablets (100 mg total) by mouth every morning.  60 tablet  6  . simvastatin (ZOCOR) 40 MG tablet Take  1 tablet (40 mg total) by mouth daily.  30 tablet  6  . traMADol (ULTRAM) 50 MG tablet Take 1 tablet (50 mg total) by mouth every 6 (six) hours as needed for pain.  120 tablet  5  . traZODone (DESYREL) 50 MG tablet Take 1-2 tablets (50-100 mg total) by mouth at bedtime.  60 tablet  2  . Vitamins-Lipotropics (B COMPLEX FORMULA 1 PO) Take 1 tablet by mouth daily.          REVIEW OF SYSTEMS: Arly.Keller ] denotes positive finding; [  ] denotes negative finding CARDIOVASCULAR:  [ ]  chest pain   [ ]  chest pressure   [ ]  palpitations   [ ]  orthopnea   [ ]  dyspnea on exertion   [ ]  claudication   [ ]  rest pain   [ ]  DVT   [ ]  phlebitis PULMONARY:   [ ]  productive cough   [ ]  asthma   [ ]  wheezing NEUROLOGIC:   [ ]  weakness  [ ]  paresthesias  [ ]  aphasia  [ ]  amaurosis  [ ]  dizziness HEMATOLOGIC:   [ ]  bleeding problems   [ ]  clotting disorders MUSCULOSKELETAL:   [ ]  joint pain   [ ]  joint swelling GASTROINTESTINAL: [ ]   blood in stool  [ ]   hematemesis GENITOURINARY:  [ ]   dysuria  [ ]   hematuria PSYCHIATRIC:  [ ]  history of major depression INTEGUMENTARY:  [ ]  rashes  [ ]  ulcers CONSTITUTIONAL:  [ ]  fever   [ ]  chills  PHYSICAL EXAM: Filed Vitals:   06/08/11 1625  BP: 111/75  Pulse: 73  Temp: 98.7 F (37.1 C)  TempSrc: Oral  Height: 6' (1.829 m)  Weight: 166 lb 8 oz (75.524 kg)  SpO2: 98%   Body mass index is 22.58 kg/(m^2). GENERAL: The patient is a well-nourished male, in no acute distress. The vital signs are documented above. CARDIOVASCULAR: There is a regular rate and rhythm without significant murmur appreciated.  PULMONARY: There is good air exchange bilaterally without wheezing or rales. ABDOMEN: Soft and non-tender with normal pitched bowel sounds.  His incisions in the left leg are healing nicely. He has a palpable graft pulse. His left foot is warm and well-perfused.  MEDICAL ISSUES: Overall pleased with his progress. Graft is functioning well. His incisions are healing nicely. I'll see him back in 6 months. I've ordered a graft duplex for that time. He knows to call sooner if he has problems.  Jelisa  S [facility] [place] Office: [phone]

## 2011-06-14 ENCOUNTER — Other Ambulatory Visit: Payer: Self-pay | Admitting: *Deleted

## 2011-06-14 DIAGNOSIS — M549 Dorsalgia, unspecified: Secondary | ICD-10-CM

## 2011-06-14 DIAGNOSIS — F329 Major depressive disorder, single episode, unspecified: Secondary | ICD-10-CM

## 2011-06-14 MED ORDER — METOPROLOL TARTRATE 50 MG PO TABS: 50.0000 mg | ORAL_TABLET | Freq: Two times a day (BID) | ORAL | Status: DC

## 2011-06-14 MED ORDER — TRAZODONE HCL 50 MG PO TABS: 50.0000 mg | ORAL_TABLET | Freq: Every day | ORAL | Status: DC

## 2011-06-14 MED ORDER — ENALAPRIL MALEATE 10 MG PO TABS: 10.0000 mg | ORAL_TABLET | Freq: Every day | ORAL | Status: DC

## 2011-06-14 MED ORDER — HYDROCHLOROTHIAZIDE 25 MG PO TABS: 25.0000 mg | ORAL_TABLET | Freq: Every day | ORAL | Status: DC

## 2011-06-14 MED ORDER — OMEPRAZOLE 20 MG PO CPDR: 20.0000 mg | DELAYED_RELEASE_CAPSULE | Freq: Every day | ORAL | Status: DC

## 2011-06-14 NOTE — Telephone Encounter (Signed)
needs appointment.

## 2011-06-15 NOTE — Telephone Encounter (Signed)
Rxs called to [facility].  Message sent to front desk for an appt.

## 2011-06-20 ENCOUNTER — Encounter: Payer: Medicare Other | Admitting: Internal Medicine

## 2011-06-22 ENCOUNTER — Encounter: Payer: Medicare Other | Admitting: Internal Medicine

## 2011-06-27 ENCOUNTER — Other Ambulatory Visit: Payer: Self-pay | Admitting: *Deleted

## 2011-06-28 MED ORDER — HYDROCODONE-ACETAMINOPHEN 10-325 MG PO TABS: 1.0000 | ORAL_TABLET | ORAL | Status: DC | PRN

## 2011-06-28 MED ORDER — SIMVASTATIN 40 MG PO TABS: 40.0000 mg | ORAL_TABLET | Freq: Every day | ORAL | Status: DC

## 2011-06-28 MED ORDER — CLONAZEPAM 1 MG PO TABS: 1.0000 mg | ORAL_TABLET | Freq: Three times a day (TID) | ORAL | Status: DC | PRN

## 2011-06-28 NOTE — Telephone Encounter (Signed)
Klonopin and Norco rxs called to [facility].

## 2011-07-27 ENCOUNTER — Other Ambulatory Visit: Payer: Self-pay | Admitting: *Deleted

## 2011-07-27 MED ORDER — ENALAPRIL MALEATE 10 MG PO TABS: 10.0000 mg | ORAL_TABLET | Freq: Every day | ORAL | Status: DC

## 2011-07-27 MED ORDER — METOPROLOL TARTRATE 50 MG PO TABS: 50.0000 mg | ORAL_TABLET | Freq: Two times a day (BID) | ORAL | Status: DC

## 2011-08-15 ENCOUNTER — Other Ambulatory Visit: Payer: Self-pay | Admitting: *Deleted

## 2011-08-15 DIAGNOSIS — F329 Major depressive disorder, single episode, unspecified: Secondary | ICD-10-CM

## 2011-08-16 NOTE — Telephone Encounter (Signed)
Pt has appt 2/6 and when speaking to him he says he does not want to be seen before that time

## 2011-09-14 ENCOUNTER — Encounter: Payer: Self-pay | Admitting: Internal Medicine

## 2011-09-14 ENCOUNTER — Ambulatory Visit (INDEPENDENT_AMBULATORY_CARE_PROVIDER_SITE_OTHER): Payer: Medicare Other | Admitting: Internal Medicine

## 2011-09-14 VITALS — BP 114/77 | HR 75 | Temp 99.6°F | Ht 71.0 in | Wt 160.1 lb

## 2011-09-14 DIAGNOSIS — F329 Major depressive disorder, single episode, unspecified: Secondary | ICD-10-CM

## 2011-09-14 DIAGNOSIS — E785 Hyperlipidemia, unspecified: Secondary | ICD-10-CM

## 2011-09-14 DIAGNOSIS — Z0000 Encounter for general adult medical examination without abnormal findings: Secondary | ICD-10-CM

## 2011-09-14 DIAGNOSIS — I739 Peripheral vascular disease, unspecified: Secondary | ICD-10-CM

## 2011-09-14 DIAGNOSIS — I10 Essential (primary) hypertension: Secondary | ICD-10-CM

## 2011-09-14 DIAGNOSIS — F411 Generalized anxiety disorder: Secondary | ICD-10-CM

## 2011-09-14 DIAGNOSIS — M549 Dorsalgia, unspecified: Secondary | ICD-10-CM

## 2011-09-14 MED ORDER — TRAZODONE HCL 50 MG PO TABS: 50.0000 mg | ORAL_TABLET | Freq: Every day | ORAL | Status: DC

## 2011-09-14 MED ORDER — TRAMADOL HCL 50 MG PO TABS: 50.0000 mg | ORAL_TABLET | Freq: Four times a day (QID) | ORAL | Status: DC | PRN

## 2011-09-14 MED ORDER — OMEPRAZOLE 20 MG PO CPDR: 20.0000 mg | DELAYED_RELEASE_CAPSULE | Freq: Every day | ORAL | Status: DC

## 2011-09-14 MED ORDER — HYDROCODONE-ACETAMINOPHEN 10-325 MG PO TABS: 1.0000 | ORAL_TABLET | ORAL | Status: DC | PRN

## 2011-09-14 MED ORDER — SERTRALINE HCL 50 MG PO TABS: 100.0000 mg | ORAL_TABLET | ORAL | Status: DC

## 2011-09-14 NOTE — Patient Instructions (Signed)
Your blood pressure is well controlled, good job!  Please do not eat on the day of your next office visit, and we will check your cholesterol  Please return for a follow-up appointment in 6 months

## 2011-09-15 ENCOUNTER — Other Ambulatory Visit: Payer: Self-pay | Admitting: Internal Medicine

## 2011-09-15 ENCOUNTER — Encounter: Payer: Self-pay | Admitting: Internal Medicine

## 2011-09-15 NOTE — Progress Notes (Signed)
HPI: The patient is a 56 yo man, history of PVD, HTN, HL, depression, and chronic back pain, presenting for a routine follow-up.  In 05/2011, the patient underwent a L femoral-tibial bypass surgery for leg claudication, and reports significantly less leg pain since that time.  At the patient's last OPC visit in 02/2011, we increased his sertraline dosage for his depression/anxiety, and the patient notes an improvement in mood since that time.  We also added trazodone qhs for difficulty sleeping, which the patient has been using a few times per week, with moderate improvement in symptoms.  The patient notes that since his last visit, he was approved for disability, about which he is very pleased.  ROS: General: no fevers, chills, changes in weight, changes in appetite Skin: no rash HEENT: no blurry vision, hearing changes, sore throat Pulm: no dyspnea, coughing, wheezing CV: no chest pain, palpitations, shortness of breath Abd: no abdominal pain, nausea/vomiting, diarrhea/constipation GU: no dysuria, hematuria, polyuria Ext: +PVD Neuro: no weakness, numbness, or tingling  Filed Vitals:   09/14/11 1329  BP: 114/77  Pulse: 75  Temp: 99.6 F (37.6 C)   PEX General: alert, cooperative, and in no apparent distress HEENT: pupils equal round and reactive to light, vision grossly intact, oropharynx clear and non-erythematous  Neck: supple, no lymphadenopathy, no carotid bruits Lungs: clear to ascultation bilaterally, normal work of respiration, no wheezes, rales, ronchi Heart: regular rate and rhythm, no murmurs, gallops, or rubs Abdomen: soft, non-tender, non-distended, normal bowel sounds Msk: no joint edema, warmth, or erythema Extremities: no cyanosis, clubbing, or edema.  Healed surgical scar on left leg from fem-tibial bypass Neurologic: alert & oriented X3, cranial nerves II-XII intact, strength grossly intact, sensation intact to light touch  Assessment/Plan

## 2011-09-15 NOTE — Assessment & Plan Note (Signed)
-  patient declines colonoscopy, due to financial and transportation constraints -patient declines flu shot

## 2011-09-15 NOTE — Assessment & Plan Note (Signed)
BP well-controlled on metoprolol, HCTZ, enalapril -continue current regimen

## 2011-09-15 NOTE — Assessment & Plan Note (Signed)
LDL = 76 when last checked, patient currently not fasting -patient advised to present fasting at next visit, will check lipid panel at that time -continue simvastatin

## 2011-09-15 NOTE — Assessment & Plan Note (Signed)
The patient notes an improvement in mood and less anxiety since increasing his sertraline to 100 mg at his last visit.  He also notes decreased insomnia after adding trazodone. -continue sertraline, clonazepam for anxiety -continue trazodone for insomnia

## 2011-09-15 NOTE — Assessment & Plan Note (Signed)
S/p L femoral-tibial bypass in 05/2011, with significant improvement in symptoms of leg pain/claudication -continue medical management of risk factors -follow-up with surgeon for further eval/treatment

## 2011-09-15 NOTE — Assessment & Plan Note (Signed)
Patient's back pain is stable.  At last visit, patient noted a problem of running out of vicodin before the end of the month.  We added tramadol as an adjunct agent, which has allowed the patient to use less vicodin, and he has not had a problem with running out of his medication early since that time. -continue current management with vicodin, tramadol

## 2011-11-23 ENCOUNTER — Other Ambulatory Visit: Payer: Self-pay | Admitting: *Deleted

## 2011-11-24 MED ORDER — CLONAZEPAM 1 MG PO TABS: 1.0000 mg | ORAL_TABLET | Freq: Three times a day (TID) | ORAL | Status: DC | PRN

## 2011-11-24 NOTE — Telephone Encounter (Signed)
Klonopin rx called to [facility]. 

## 2011-12-16 ENCOUNTER — Encounter (INDEPENDENT_AMBULATORY_CARE_PROVIDER_SITE_OTHER): Payer: Medicare Other | Admitting: *Deleted

## 2011-12-16 DIAGNOSIS — Z95828 Presence of other vascular implants and grafts: Secondary | ICD-10-CM

## 2011-12-16 DIAGNOSIS — I739 Peripheral vascular disease, unspecified: Secondary | ICD-10-CM

## 2011-12-16 DIAGNOSIS — Z48812 Encounter for surgical aftercare following surgery on the circulatory system: Secondary | ICD-10-CM

## 2011-12-21 ENCOUNTER — Encounter: Payer: Self-pay | Admitting: Vascular Surgery

## 2011-12-21 ENCOUNTER — Other Ambulatory Visit: Payer: Self-pay | Admitting: *Deleted

## 2011-12-21 DIAGNOSIS — Z48812 Encounter for surgical aftercare following surgery on the circulatory system: Secondary | ICD-10-CM

## 2011-12-21 DIAGNOSIS — I739 Peripheral vascular disease, unspecified: Secondary | ICD-10-CM

## 2011-12-21 NOTE — Procedures (Unsigned)
BYPASS GRAFT EVALUATION  INDICATION:  Followup left femoral to peroneal bypass graft 2003. 05/12/11: Repair of left fem-peroneal BPG with SV patch angio.  HISTORY: Diabetes:  Yes Cardiac: Hypertension:  Yes Smoking:  Yes Previous Surgery:  Aorta to bifemoral bypass graft, left femoral to peroneal bypass graft 2003, repair of left femoral to peroneal bypass graft with saphenous vein patch 05/12/2011.  SINGLE LEVEL ARTERIAL EXAM                              RIGHT              LEFT Brachial: Anterior tibial: Posterior tibial: Peroneal: Ankle/brachial index:        0.60               1.14  PREVIOUS ABI:  Date:  05/13/2011 ([facility])  RIGHT:  0.41  LEFT:  0.96   LOWER EXTREMITY BYPASS GRAFT DUPLEX EXAM:  DUPLEX:  Patent left femoral to peroneal bypass graft with low velocities noted in the proximal portion of the left limb of aortobifemoral bypass graft.  Biphasic waveforms in the bypass graft is noted with narrowing distal to the vein patch with velocities of 248 cm.  IMPRESSION:  Patent left femoral to peroneal bypass graft with velocities as noted above.  See attached for ankle brachial indices.       ___________________________________________ Di Kindle. Edilia Bo, M.D.  SS/MEDQ  D:  12/16/2011  T:  12/16/2011  Job:  [id number]

## 2012-01-17 ENCOUNTER — Other Ambulatory Visit: Payer: Self-pay | Admitting: Ophthalmology

## 2012-01-17 DIAGNOSIS — E785 Hyperlipidemia, unspecified: Secondary | ICD-10-CM

## 2012-03-14 ENCOUNTER — Other Ambulatory Visit: Payer: Self-pay | Admitting: Internal Medicine

## 2012-03-21 ENCOUNTER — Other Ambulatory Visit: Payer: Self-pay | Admitting: *Deleted

## 2012-03-22 ENCOUNTER — Other Ambulatory Visit: Payer: Self-pay | Admitting: *Deleted

## 2012-03-22 MED ORDER — HYDROCODONE-ACETAMINOPHEN 10-325 MG PO TABS: 1.0000 | ORAL_TABLET | ORAL | Status: DC | PRN

## 2012-03-22 NOTE — Telephone Encounter (Signed)
Rx called in 

## 2012-03-22 NOTE — Telephone Encounter (Signed)
Pt is out of med, will you refill? Last filled @ [facility] 7/16

## 2012-03-22 NOTE — Telephone Encounter (Signed)
Med previously ordered

## 2012-04-02 ENCOUNTER — Other Ambulatory Visit: Payer: Self-pay | Admitting: Internal Medicine

## 2012-05-18 ENCOUNTER — Other Ambulatory Visit: Payer: Self-pay | Admitting: *Deleted

## 2012-05-18 MED ORDER — CLONAZEPAM 1 MG PO TABS: 1.0000 mg | ORAL_TABLET | Freq: Three times a day (TID) | ORAL | Status: DC | PRN

## 2012-05-21 NOTE — Telephone Encounter (Signed)
Called to pharm 

## 2012-06-05 ENCOUNTER — Telehealth: Payer: Self-pay

## 2012-06-05 DIAGNOSIS — M79672 Pain in left foot: Secondary | ICD-10-CM

## 2012-06-05 DIAGNOSIS — R202 Paresthesia of skin: Secondary | ICD-10-CM

## 2012-06-05 DIAGNOSIS — I739 Peripheral vascular disease, unspecified: Secondary | ICD-10-CM

## 2012-06-05 NOTE — Telephone Encounter (Signed)
Phone call ret'd to pt.  States has had pain in left foot the past 2 weeks.  States the pain in his foot has worsened over past 2 days.  Describes feeling of pins and needles in toes and foot.  States "my ankle feels ice cold"  States he feels like the symptoms are similar to when he had the blockage in his Bypass graft.  States hasn't slept in 2-3 nights.  Advised pt. With the cold temperature change and the pain, he may need to go to the ER tonight.  States he doesn't want to go to the ER and sit for 10 hrs before being evaluated.  Advised if symptoms worsen to go to the ER.  Advised will discuss w/ nurse practitioner in the AM and call pt. With further recommendations. Verb. Understanding.

## 2012-06-05 NOTE — Telephone Encounter (Signed)
Message copied by Phillips Odor on Tue Jun 05, 2012  5:06 PM ------      Message from: Marcellus Scott      Created: Tue Jun 05, 2012  2:59 PM      Contact: [phone]       Andrew [name] is in a lot of pain in his right foot. He has bypass surgery 6 months ago on left leg. He said he can hardly walk on that foot. Feels like he might loose it. Needs to be seen.            Thanks      Revonda Standard

## 2012-06-06 ENCOUNTER — Other Ambulatory Visit: Payer: Self-pay | Admitting: *Deleted

## 2012-06-06 ENCOUNTER — Encounter: Payer: Self-pay | Admitting: Neurosurgery

## 2012-06-06 NOTE — Telephone Encounter (Signed)
Has appt 06/07/12 with vascular doctor.

## 2012-06-06 NOTE — Telephone Encounter (Signed)
Pt. ret'd call. Appt. given for 06/07/12 at 9:30 AM for ABI's and LE arterial duplex, and to see nurse practitioner.  Agrees w/ plan.

## 2012-06-06 NOTE — Telephone Encounter (Signed)
Discussed pt's. symptoms with Azalia Bilis, NP.  Advised to bring pt. In for Left LE Arterial Duplex of BPG and office visit on 06/07/12, as there is no MD in office today.  Attempted to call pt.  Left VM to call office for appt.

## 2012-06-07 ENCOUNTER — Ambulatory Visit (INDEPENDENT_AMBULATORY_CARE_PROVIDER_SITE_OTHER): Payer: Medicare Other | Admitting: Vascular Surgery

## 2012-06-07 ENCOUNTER — Encounter (INDEPENDENT_AMBULATORY_CARE_PROVIDER_SITE_OTHER): Payer: Medicare Other | Admitting: *Deleted

## 2012-06-07 ENCOUNTER — Ambulatory Visit: Payer: Medicare Other | Admitting: Internal Medicine

## 2012-06-07 ENCOUNTER — Encounter: Payer: Self-pay | Admitting: Neurosurgery

## 2012-06-07 VITALS — BP 150/92 | HR 100 | Resp 16 | Ht 71.0 in | Wt 165.0 lb

## 2012-06-07 DIAGNOSIS — R202 Paresthesia of skin: Secondary | ICD-10-CM

## 2012-06-07 DIAGNOSIS — I739 Peripheral vascular disease, unspecified: Secondary | ICD-10-CM

## 2012-06-07 DIAGNOSIS — M79672 Pain in left foot: Secondary | ICD-10-CM

## 2012-06-07 DIAGNOSIS — M79609 Pain in unspecified limb: Secondary | ICD-10-CM

## 2012-06-07 DIAGNOSIS — R209 Unspecified disturbances of skin sensation: Secondary | ICD-10-CM

## 2012-06-07 DIAGNOSIS — R200 Anesthesia of skin: Secondary | ICD-10-CM

## 2012-06-07 MED ORDER — OXYCODONE-ACETAMINOPHEN 10-325 MG PO TABS: 1.0000 | ORAL_TABLET | ORAL | Status: DC | PRN

## 2012-06-07 NOTE — Telephone Encounter (Signed)
Talked with pt - saw Dr Darrick Penna this AM and was given Percocet 10-325 #40 for pain. Pt states was feeling sl better - needs to go back to office 06/11/12.

## 2012-06-07 NOTE — Progress Notes (Signed)
[facility] [place] HISTORY AND PHYSICAL   History of Present Illness:  Patient is a 56 y.o. year old male who presents for evaluation of pain in his left foot.  The patient previously underwent aortobifemoral bypass several years ago. He has also had a previous left femoral to peroneal bypass. He had revision with patch angioplasty of this peroneal bypass by Dr. Edilia Bo approximately one year ago. The patient states his symptoms have been present for several weeks. He states that he has continuous pain in the left foot. He has no ulcerations on the foot. Other medical problems include ongoing tobacco abuse, borderline diabetes, hyperlipidemia, hypertension, chronic back pain.  All these are currently stable.  Past Medical History  Diagnosis Date  . Tobacco abuse   . 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 Manson Passey).  Pt will likely require a wide resection and  has been refered to ENT (2/12)  . Dental caries   . Glucose intolerance (impaired glucose tolerance)     Max random CBG 130 (08/29/07) typically about 100. A1C in 11/08 was 5.4.  . Peripheral vascular disease   . Hypertension, essential   . Dyslipidemia   . Anxiety   . Thoracic kyphosis   . Back pain      The patient has chronic lumbar/thoracic back pain, he has kyphosis, mild facet hypertrophy at L1-L2, circumferential annular bulging and mild vertebral body osteophytic formation at L2-L3, mild to moderate stenosis of the medial aspect of the neural foramen at L4-L5, and mild annular bulging at L5-S1.  Marland Kitchen History of alcohol abuse   . Diabetes mellitus   . Hyperlipidemia   . Peripheral arterial disease   . Leg pain   . Back pain   . Ulcer     Past Surgical History  Procedure Date  . Carotid endarterectomy   . Penile prosthesis implant 1985    secondary to a forklift acciednt and crushed pelvis  . Pr vein bypass  graft,aorto-fem-pop 2003    Dr. Madilyn Fireman  . Pr vein bypass graft,aorto-fem-pop 05/12/11    Left fem-peroneal BPG      Social History History  Substance Use Topics  . Smoking status: Current Every Day Smoker -- 0.2 packs/day for 40 years    Types: Cigarettes  . Smokeless tobacco: Not on file   Comment: trying to quit  . Alcohol Use: No    Family History Family History  Problem Relation Age of Onset  . Heart disease Mother   . Cancer Mother   . Alcohol abuse Father   . Heart disease Father   . Stroke Father     Allergies  No Known Allergies   Current Outpatient Prescriptions  Medication Sig Dispense Refill  . aspirin 81 MG chewable tablet Chew 1 tablet (81 mg total) by mouth daily.  30 tablet  6  . clonazePAM (KLONOPIN) 1 MG tablet Take 1 tablet (1 mg total) by mouth 3 (three) times daily as needed for anxiety.  90 tablet  5  . enalapril (VASOTEC) 10 MG tablet Take 1 tablet (10 mg total) by mouth daily.  30 tablet  11  . hydrochlorothiazide (HYDRODIURIL) 25 MG tablet TAKE 1 TABLET BY MOUTH EVERY DAY  30 tablet  11  . metoprolol (LOPRESSOR) 50 MG tablet Take 1 tablet (50 mg total) by mouth 2 (two) times daily.  60 tablet  11  .  omeprazole (PRILOSEC) 20 MG capsule Take 1 capsule (20 mg total) by mouth daily.  30 capsule  11  . sertraline (ZOLOFT) 50 MG tablet Take 2 tablets (100 mg total) by mouth every morning.  60 tablet  11  . simvastatin (ZOCOR) 40 MG tablet Take 1 tablet (40 mg total) by mouth at bedtime.  30 tablet  11  . traZODone (DESYREL) 50 MG tablet TAKE 1-2 TABLETS BY MOUTH AT BEDTIME.  60 tablet  5  . Vitamins-Lipotropics (B COMPLEX FORMULA 1 PO) Take 1 tablet by mouth daily.        Marland Kitchen oxyCODONE-acetaminophen (PERCOCET) 10-325 MG per tablet Take 1 tablet by mouth every 4 (four) hours as needed for pain.  40 tablet  0    ROS:   General:  No weight loss, Fever, chills  HEENT: No recent headaches, no nasal bleeding, no visual changes, no sore throat  Neurologic:  No dizziness, blackouts, seizures. No recent symptoms of stroke or mini- stroke. No recent episodes of slurred speech, or temporary blindness.  Cardiac: No recent episodes of chest pain/pressure, no shortness of breath at rest.  + shortness of breath with exertion.  Denies history of atrial fibrillation or irregular heartbeat  Vascular: + history of rest pain in feet.  + history of claudication.  No history of non-healing ulcer, No history of DVT   Pulmonary: No home oxygen, + productive cough, no hemoptysis,  No asthma or wheezing  Musculoskeletal:  [ ]  Arthritis, [x ] Low back pain,  [ ]  Joint pain  Hematologic:No history of hypercoagulable state.  No history of easy bleeding.  No history of anemia  Gastrointestinal: No hematochezia or melena,  No gastroesophageal reflux, no trouble swallowing  Urinary: [ ]  chronic Kidney disease, [ ]  on HD - [ ]  MWF or [ ]  TTHS, [ ]  Burning with urination, [ ]  Frequent urination, [ ]  Difficulty urinating;   Skin: No rashes  Psychological: No history of anxiety,  No history of depression   Physical Examination  Filed Vitals:   06/07/12 1057  BP: 150/92  Pulse: 100  Resp: 16  Height: 5\' 11"  (1.803 m)  Weight: 165 lb (74.844 kg)  SpO2: 100%    Body mass index is 23.01 kg/(m^2).  General:  Alert and oriented, no acute distress HEENT: Normal Neck: No bruit or JVD Pulmonary: Clear to auscultation bilaterally Cardiac: Regular Rate and Rhythm without murmur Abdomen: Soft, non-tender, non-distended, no mass Skin: No rash, left foot rubor Extremity Pulses:  2+ radial, brachial, femoral, absent popliteal dorsalis pedis, posterior tibial pulses bilaterally Musculoskeletal: No deformity or edema  Neurologic: Upper and lower extremity motor 5/5 and symmetric  DATA: The patient had a lower extremity arterial duplex today. This shows that the left than peroneal bypass is occluded. His ABI today was 0.27. ABI on the right was 0.60.   ASSESSMENT:  Occluded left than peroneal bypass. The patient currently has rest pain. He clearly has a threatened limb. The history suggests this is passed the period to consider lysis. I discussed with the patient today the high possibility of limb loss.   PLAN:  Aortogram with bilateral lower extremity runoff possible intervention by Dr. Edilia Bo on Monday, November 4. Risks benefits possible complications and procedure details were explained to the patient today. He was also given a prescription for Percocet 10/325 #40 dispensed. He was also told to discontinue his Ultram and Vicodin while he was taking the Percocet.  Fabienne Bruns, MD [facility] [place] Office: [phone] Pager: [phone]

## 2012-06-08 ENCOUNTER — Other Ambulatory Visit: Payer: Self-pay

## 2012-06-11 ENCOUNTER — Encounter (HOSPITAL_COMMUNITY)
Admission: RE | Disposition: A | Discharge status: Home / Self Care | Payer: Self-pay | Source: Ambulatory Visit | Attending: Vascular Surgery

## 2012-06-11 ENCOUNTER — Ambulatory Visit (HOSPITAL_COMMUNITY): Payer: Medicare Other

## 2012-06-11 ENCOUNTER — Encounter (HOSPITAL_COMMUNITY): Payer: Self-pay | Admitting: Pharmacy Technician

## 2012-06-11 ENCOUNTER — Inpatient Hospital Stay (HOSPITAL_COMMUNITY)
Admission: RE | Admit: 2012-06-11 | Discharge: 2012-06-14 | DRG: 254 | Disposition: A | Discharge status: Home / Self Care | Payer: Medicare Other | Source: Ambulatory Visit | Attending: Vascular Surgery | Admitting: Vascular Surgery

## 2012-06-11 ENCOUNTER — Other Ambulatory Visit: Payer: Self-pay | Admitting: *Deleted

## 2012-06-11 DIAGNOSIS — M79609 Pain in unspecified limb: Secondary | ICD-10-CM

## 2012-06-11 DIAGNOSIS — E785 Hyperlipidemia, unspecified: Secondary | ICD-10-CM

## 2012-06-11 DIAGNOSIS — Z85819 Personal history of malignant neoplasm of unspecified site of lip, oral cavity, and pharynx: Secondary | ICD-10-CM

## 2012-06-11 DIAGNOSIS — R7309 Other abnormal glucose: Secondary | ICD-10-CM | POA: Diagnosis present

## 2012-06-11 DIAGNOSIS — I10 Essential (primary) hypertension: Secondary | ICD-10-CM | POA: Diagnosis present

## 2012-06-11 DIAGNOSIS — F17200 Nicotine dependence, unspecified, uncomplicated: Secondary | ICD-10-CM | POA: Diagnosis present

## 2012-06-11 DIAGNOSIS — M549 Dorsalgia, unspecified: Secondary | ICD-10-CM | POA: Diagnosis present

## 2012-06-11 DIAGNOSIS — I739 Peripheral vascular disease, unspecified: Secondary | ICD-10-CM

## 2012-06-11 DIAGNOSIS — I70509 Unspecified atherosclerosis of nonautologous biological bypass graft(s) of the extremities, unspecified extremity: Secondary | ICD-10-CM

## 2012-06-11 DIAGNOSIS — Z7982 Long term (current) use of aspirin: Secondary | ICD-10-CM

## 2012-06-11 DIAGNOSIS — I70409 Unspecified atherosclerosis of autologous vein bypass graft(s) of the extremities, unspecified extremity: Principal | ICD-10-CM | POA: Diagnosis present

## 2012-06-11 HISTORY — PX: ABDOMINAL AORTAGRAM: SHX5454

## 2012-06-11 LAB — CBC
Platelets: 227 10*3/uL (ref 150–400)
RDW: 12.9 % (ref 11.5–15.5)
WBC: 9.9 10*3/uL (ref 4.0–10.5)

## 2012-06-11 LAB — TYPE AND SCREEN: Antibody Screen: NEGATIVE

## 2012-06-11 LAB — PROTIME-INR
INR: 1 (ref 0.00–1.49)
Prothrombin Time: 13.1 seconds (ref 11.6–15.2)

## 2012-06-11 LAB — COMPREHENSIVE METABOLIC PANEL
AST: 16 U/L (ref 0–37)
Albumin: 3.2 g/dL — ABNORMAL LOW (ref 3.5–5.2)
Alkaline Phosphatase: 82 U/L (ref 39–117)
Chloride: 102 mEq/L (ref 96–112)
Potassium: 4.1 mEq/L (ref 3.5–5.1)
Total Bilirubin: 0.2 mg/dL — ABNORMAL LOW (ref 0.3–1.2)

## 2012-06-11 LAB — POCT I-STAT, CHEM 8
BUN: 10 mg/dL (ref 6–23)
Creatinine, Ser: 1 mg/dL (ref 0.50–1.35)
Potassium: 4.1 mEq/L (ref 3.5–5.1)
Sodium: 137 mEq/L (ref 135–145)

## 2012-06-11 LAB — SURGICAL PCR SCREEN: MRSA, PCR: NEGATIVE

## 2012-06-11 LAB — URINALYSIS, ROUTINE W REFLEX MICROSCOPIC
Glucose, UA: NEGATIVE mg/dL
Leukocytes, UA: NEGATIVE
Protein, ur: NEGATIVE mg/dL
pH: 7 (ref 5.0–8.0)

## 2012-06-11 LAB — GLUCOSE, CAPILLARY: Glucose-Capillary: 100 mg/dL — ABNORMAL HIGH (ref 70–99)

## 2012-06-11 LAB — APTT: aPTT: 38 seconds — ABNORMAL HIGH (ref 24–37)

## 2012-06-11 SURGERY — ABDOMINAL AORTAGRAM
Anesthesia: LOCAL

## 2012-06-11 MED ORDER — LIDOCAINE HCL (PF) 1 % IJ SOLN
INTRAMUSCULAR | Status: AC
  Filled 2012-06-11: qty 30

## 2012-06-11 MED ORDER — ONDANSETRON HCL 4 MG/2ML IJ SOLN: 4.0000 mg | Freq: Four times a day (QID) | INTRAMUSCULAR | Status: DC | PRN

## 2012-06-11 MED ORDER — OXYCODONE-ACETAMINOPHEN 5-325 MG PO TABS
1.0000 | ORAL_TABLET | ORAL | Status: DC | PRN
  Administered 2012-06-11: 2 via ORAL
  Filled 2012-06-11: qty 2

## 2012-06-11 MED ORDER — ACETAMINOPHEN 325 MG PO TABS: 650.0000 mg | ORAL_TABLET | ORAL | Status: DC | PRN

## 2012-06-11 MED ORDER — FENTANYL CITRATE 0.05 MG/ML IJ SOLN
INTRAMUSCULAR | Status: AC
  Filled 2012-06-11: qty 2

## 2012-06-11 MED ORDER — SODIUM CHLORIDE 0.9 % IV SOLN
INTRAVENOUS | Status: DC
  Administered 2012-06-11: 07:00:00 via INTRAVENOUS

## 2012-06-11 MED ORDER — SODIUM CHLORIDE 0.9 % IV SOLN: 1.0000 mL/kg/h | INTRAVENOUS | Status: DC

## 2012-06-11 MED ORDER — VARENICLINE TARTRATE 0.5 MG PO TABS: 1.0000 mg | ORAL_TABLET | Freq: Two times a day (BID) | ORAL | Status: DC

## 2012-06-11 MED ORDER — DEXTROSE 5 % IV SOLN: 1.5000 g | INTRAVENOUS | Status: DC

## 2012-06-11 NOTE — Op Note (Signed)
PATIENT: Andrew [name]   MRN: [id number] DOB: 03-08-1956    DATE OF PROCEDURE: 06/11/2012  INDICATIONS: Andrew [name] is a 56 y.o. male ischemic left foot  PROCEDURE:  1. Ultrasound-guided access to the right limb of aortofemoral bypass 2. Aortogram. 3. Selective catheterization of the left limb of aortofemoral bypass graft with left lower extremity runoff 4. Right lower extremity runoff  SURGEON: Andrew Kindle. Edilia Bo, MD, FACS  ANESTHESIA: local with sedation   EBL: minimal  TECHNIQUE: The patient was brought to the peripheral vascular lab and sedated with 50 mcg of fentanyl. Both groins were prepped and draped in the usual sterile fashion. Under ultrasound guidance, after the skin was anesthetized, the right limb of the aortofemoral bypass graft was cannulated and a guidewire introduced into the infrarenal aorta. A 5 French sheath was introduced over the wire. A pigtail catheter was positioned at the L1 vertebral body a flush aortogram obtained. A lateral projection was obtained. Next the pigtail catheter was exchanged for a SOS catheter which was positioned into the left limb of the aortobifemoral bypass graft. Selective left lower extremity arteriogram was obtained. This catheter was then removed and right lower extremity films obtained in the right femoral sheath.  FINDINGS:  1. Patent aortobifemoral bypass graft. The proximal anastomosis is end to side. 2. Occluded left femoral to peroneal artery bypass graft. The left superficial femoral artery is occluded at its origin. The superficial femoral, popliteal, anterior tibial, posterior tibial, tibial peroneal trunk, and proximal peroneal arteries are occluded. There is reconstitution of the peroneal artery in the middle portion of the left leg. 3. On the right side, the common femoral and deep femoral artery are patent. The superficial femoral artery is occluded at its origin. There is reconstitution of the popliteal artery above the  knee. The anterior tibial and posterior tibial arteries are occluded. There is single vessel runoff on the right via the peroneal artery.  [name], MD, FACS [facility]  DATE OF DICTATION:   06/11/2012

## 2012-06-11 NOTE — Pre-Procedure Instructions (Signed)
20 Andrew [name]  06/11/2012   Your procedure is scheduled on:  June 12, 2012  Report to [facility] at 5:30 AM.  Call this number if you have problems the morning of surgery: [phone]   Remember:   Do not eat food:After Midnight.    Take these medicines the morning of surgery with A SIP OF WATER: Klonopin, Metoprolol, Zoloft, Percocet, Prilosec   Do not wear jewelry, make-up or nail polish.  Do not wear lotions, powders, or perfumes. You may wear deodorant.  Do not shave 48 hours prior to surgery. Men may shave face and neck.  Do not bring valuables to the hospital.  Contacts, dentures or bridgework may not be worn into surgery.  Leave suitcase in the car. After surgery it may be brought to your room.  For patients admitted to the hospital, checkout time is 11:00 AM the day of discharge.   Patients discharged the day of surgery will not be allowed to drive home.  Name and phone number of your driver:   Special Instructions: Shower using CHG 2 nights before surgery and the night before surgery.  If you shower the day of surgery use CHG.  Use special wash - you have one bottle of CHG for all showers.  You should use approximately 1/3 of the bottle for each shower.   Please read over the following fact sheets that you were given: Pain Booklet, Coughing and Deep Breathing, Blood Transfusion Information and Surgical Site Infection Prevention

## 2012-06-11 NOTE — H&P (Signed)
[facility] [place]  Patient name: Andrew [name] MRN: [id number] DOB: 1956-01-02 Sex: male  CC: progressive ischemia of the left lower extremity with occluded left femoral peroneal bypass graft  HPI: Andrew [name] is a 56 y.o. male who underwent an aortobifemoral bypass graft and left femoral to peroneal artery bypass graft in 2003 by Dr. Liliane Bade. I saw him in 2012 which time he developed a markedly increased velocity in his proximal vein graft and arteriogram confirmed a focal stenosis. He underwent repair of the vein graft stenosis the saphenous vein patch on 05/12/2011. He was seen in follow up on 06/08/2011 and then lost to follow up until he presented on 06/07/2012 and was evaluated by Dr. Darrick Penna. He had developed increasing pain in the left leg approximately 3 weeks ago it was felt that he likely occluded his graft at that time. He was not felt to be a candidate for thrombolysis. He was set up for an arteriogram. His arteriogram showed an occluded femoroperoneal artery bypass graft and left and severe infrainguinal arterial occlusive disease. He had reconstitution of the peroneal artery in the mid left leg it was felt that his only remaining option for attempted limb salvage would be a redo fem-fem peroneal artery bypass graft with prosthetic graft or composite prosthetic plus vein graft. Unfortunately he continues to smoke.   Past Medical History  Diagnosis Date  . Tobacco abuse   . 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 Manson Passey).  Pt will likely require a wide resection and  has been refered to ENT (2/12)  . Dental caries   . Glucose intolerance (impaired glucose tolerance)     Max random CBG 130 (08/29/07) typically about 100. A1C in 11/08 was 5.4.  . Peripheral vascular disease   . Hypertension, essential   . Dyslipidemia   . Anxiety   . Thoracic kyphosis   . Back pain      The  patient has chronic lumbar/thoracic back pain, he has kyphosis, mild facet hypertrophy at L1-L2, circumferential annular bulging and mild vertebral body osteophytic formation at L2-L3, mild to moderate stenosis of the medial aspect of the neural foramen at L4-L5, and mild annular bulging at L5-S1.  Marland Kitchen History of alcohol abuse   . Diabetes mellitus   . Hyperlipidemia   . Peripheral arterial disease   . Leg pain   . Back pain   . Ulcer     Family History  Problem Relation Age of Onset  . Heart disease Mother   . Cancer Mother   . Alcohol abuse Father   . Heart disease Father   . Stroke Father     SOCIAL HISTORY: History  Substance Use Topics  . Smoking status: Current Every Day Smoker -- 0.2 packs/day for 40 years    Types: Cigarettes  . Smokeless tobacco: Not on file     Comment: trying to quit  . Alcohol Use: No    No Known Allergies  Current Facility-Administered Medications  Medication Dose Route Frequency Provider Last Rate Last Dose  . 0.9 %  sodium chloride infusion   Intravenous Continuous Chuck Hint, MD 100 mL/hr at 06/11/12 [phone]    . 0.9 %  sodium chloride infusion  1 mL/kg/hr Intravenous Continuous Chuck Hint, MD      . acetaminophen (TYLENOL) tablet 650 mg  650 mg Oral Q4H PRN Cristal Deer  Adele Dan, MD      . [COMPLETED] fentaNYL (SUBLIMAZE) 0.05 MG/ML injection           . [COMPLETED] lidocaine (XYLOCAINE) 1 % injection           . ondansetron (ZOFRAN) injection 4 mg  4 mg Intravenous Q6H PRN Chuck Hint, MD      . oxyCODONE-acetaminophen (PERCOCET/ROXICET) 5-325 MG per tablet 1-2 tablet  1-2 tablet Oral Q3H PRN Chuck Hint, MD        REVIEW OF SYSTEMS: Arly.Keller ] denotes positive finding; [  ] denotes negative finding CARDIOVASCULAR:  [ ]  chest pain   [ ]  chest pressure   [ ]  palpitations   [ ]  orthopnea   [ ]  dyspnea on exertion   [ ]  claudication   Arly.Keller ] rest pain- Left foot   [ ]  DVT   [ ]  phlebitis PULMONARY:   [ ]   productive cough   [ ]  asthma   [ ]  wheezing NEUROLOGIC:   [ ]  weakness  [ ]  paresthesias  [ ]  aphasia  [ ]  amaurosis  [ ]  dizziness HEMATOLOGIC:   [ ]  bleeding problems   [ ]  clotting disorders MUSCULOSKELETAL:  [ ]  joint pain   [ ]  joint swelling [ ]  leg swelling GASTROINTESTINAL: [ ]   blood in stool  [ ]   hematemesis GENITOURINARY:  [ ]   dysuria  [ ]   hematuria PSYCHIATRIC:  [ ]  history of major depression INTEGUMENTARY:  [ ]  rashes  [ ]  ulcers CONSTITUTIONAL:  [ ]  fever   [ ]  chills  PHYSICAL EXAM: Filed Vitals:   06/11/12 0618 06/11/12 0742 06/11/12 0945 06/11/12 1005  BP: 134/83  140/90 119/76  Pulse: 85 70 69 71  Temp: 97.8 F (36.6 C)  98.2 F (36.8 C)   TempSrc: Oral  Oral   Resp: 16  18 18   Height: 5\' 11"  (1.803 m)     Weight: 165 lb (74.844 kg)     SpO2: 98%  98% 99%   Body mass index is 23.01 kg/(m^2). GENERAL: The patient is a well-nourished male, in no acute distress. The vital signs are documented above. CARDIOVASCULAR: There is a regular rate and rhythm without significant murmur appreciated. I do not appreciate carotid bruits. He has palpable femoral pulses. I cannot palpate pedal pulses. The left foot is slightly cooler than the right with some mottling of the foot. PULMONARY: There is good air exchange bilaterally without wheezing or rales. ABDOMEN: Soft and non-tender with normal pitched bowel sounds. I do not operate an abdominal aortic aneurysm. MUSCULOSKELETAL: There are no major deformities or cyanosis. NEUROLOGIC: No focal weakness or paresthesias are detected. SKIN: There are no ulcers or rashes noted. PSYCHIATRIC: The patient has a normal affect.  DATA:  Arteriogram shows an occluded fem peroneal artery bypass graft. He has severe infrainguinal arterial occlusive disease with single-vessel runoff via the peroneal artery on the left which reconstitutes in the mid calf.  ABI on the left is 0.27. ABI on the right is 0.60.  MEDICAL ISSUES:  This patient  presents with an occluded femoroperoneal artery bypass graft which likely occluded 3 weeks ago. He has limb threatening ischemia of the left lower extremity with rest pain. He continues to smoke half a pack per day of cigarettes. I think without revascularization he is at significant risk for limb loss. His only remaining option for revascularization would be a fem peroneal artery bypass graft with a prosthetic graft or  a composite graft. I will have them map his distal left greater saphenous vein and lesser saphenous vein today. I would be reluctant to take vein from the right leg as he has severe infrainguinal arterial occlusive disease on the right also and would be at risk for nonhealing of a vein harvest site and also could potentially require use of the vein on the right for right lower extremity bypass. I have reviewed the indications for lower extremity bypass. I have also reviewed the potential complications of surgery including but not limited to: wound healing problems, infection, graft thrombosis, limb loss, or other unpredictable medical problems. All the patient's questions were answered and he is agreeable to proceed. The surgery is scheduled for tomorrow, on 06/12/2012.   DICKSON,CHRISTOPHER S [facility] [place] Office: [phone]

## 2012-06-11 NOTE — H&P (View-Only) (Signed)
[facility] HISTORY AND PHYSICAL   History of Present Illness:  Patient is a 56 y.o. year old male who presents for evaluation of pain in his left foot.  The patient previously underwent aortobifemoral bypass several years ago. He has also had a previous left femoral to peroneal bypass. He had revision with patch angioplasty of this peroneal bypass by Dr. Dickson approximately one year ago. The patient states his symptoms have been present for several weeks. He states that he has continuous pain in the left foot. He has no ulcerations on the foot. Other medical problems include ongoing tobacco abuse, borderline diabetes, hyperlipidemia, hypertension, chronic back pain.  All these are currently stable.  Past Medical History  Diagnosis Date  . Tobacco abuse   . 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 Brown).  Pt will likely require a wide resection and  has been refered to ENT (2/12)  . Dental caries   . Glucose intolerance (impaired glucose tolerance)     Max random CBG 130 (08/29/07) typically about 100. A1C in 11/08 was 5.4.  . Peripheral vascular disease   . Hypertension, essential   . Dyslipidemia   . Anxiety   . Thoracic kyphosis   . Back pain      The patient has chronic lumbar/thoracic back pain, he has kyphosis, mild facet hypertrophy at L1-L2, circumferential annular bulging and mild vertebral body osteophytic formation at L2-L3, mild to moderate stenosis of the medial aspect of the neural foramen at L4-L5, and mild annular bulging at L5-S1.  . History of alcohol abuse   . Diabetes mellitus   . Hyperlipidemia   . Peripheral arterial disease   . Leg pain   . Back pain   . Ulcer     Past Surgical History  Procedure Date  . Carotid endarterectomy   . Penile prosthesis implant 1985    secondary to a forklift acciednt and crushed pelvis  . Pr vein bypass  graft,aorto-fem-pop 2003    Dr. Hayes  . Pr vein bypass graft,aorto-fem-pop 05/12/11    Left fem-peroneal BPG      Social History History  Substance Use Topics  . Smoking status: Current Every Day Smoker -- 0.2 packs/day for 40 years    Types: Cigarettes  . Smokeless tobacco: Not on file   Comment: trying to quit  . Alcohol Use: No    Family History Family History  Problem Relation Age of Onset  . Heart disease Mother   . Cancer Mother   . Alcohol abuse Father   . Heart disease Father   . Stroke Father     Allergies  No Known Allergies   Current Outpatient Prescriptions  Medication Sig Dispense Refill  . aspirin 81 MG chewable tablet Chew 1 tablet (81 mg total) by mouth daily.  30 tablet  6  . clonazePAM (KLONOPIN) 1 MG tablet Take 1 tablet (1 mg total) by mouth 3 (three) times daily as needed for anxiety.  90 tablet  5  . enalapril (VASOTEC) 10 MG tablet Take 1 tablet (10 mg total) by mouth daily.  30 tablet  11  . hydrochlorothiazide (HYDRODIURIL) 25 MG tablet TAKE 1 TABLET BY MOUTH EVERY DAY  30 tablet  11  . metoprolol (LOPRESSOR) 50 MG tablet Take 1 tablet (50 mg total) by mouth 2 (two) times daily.  60 tablet  11  .   omeprazole (PRILOSEC) 20 MG capsule Take 1 capsule (20 mg total) by mouth daily.  30 capsule  11  . sertraline (ZOLOFT) 50 MG tablet Take 2 tablets (100 mg total) by mouth every morning.  60 tablet  11  . simvastatin (ZOCOR) 40 MG tablet Take 1 tablet (40 mg total) by mouth at bedtime.  30 tablet  11  . traZODone (DESYREL) 50 MG tablet TAKE 1-2 TABLETS BY MOUTH AT BEDTIME.  60 tablet  5  . Vitamins-Lipotropics (B COMPLEX FORMULA 1 PO) Take 1 tablet by mouth daily.        . oxyCODONE-acetaminophen (PERCOCET) 10-325 MG per tablet Take 1 tablet by mouth every 4 (four) hours as needed for pain.  40 tablet  0    ROS:   General:  No weight loss, Fever, chills  HEENT: No recent headaches, no nasal bleeding, no visual changes, no sore throat  Neurologic:  No dizziness, blackouts, seizures. No recent symptoms of stroke or mini- stroke. No recent episodes of slurred speech, or temporary blindness.  Cardiac: No recent episodes of chest pain/pressure, no shortness of breath at rest.  + shortness of breath with exertion.  Denies history of atrial fibrillation or irregular heartbeat  Vascular: + history of rest pain in feet.  + history of claudication.  No history of non-healing ulcer, No history of DVT   Pulmonary: No home oxygen, + productive cough, no hemoptysis,  No asthma or wheezing  Musculoskeletal:  [ ] Arthritis, [x ] Low back pain,  [ ] Joint pain  Hematologic:No history of hypercoagulable state.  No history of easy bleeding.  No history of anemia  Gastrointestinal: No hematochezia or melena,  No gastroesophageal reflux, no trouble swallowing  Urinary: [ ] chronic Kidney disease, [ ] on HD - [ ] MWF or [ ] TTHS, [ ] Burning with urination, [ ] Frequent urination, [ ] Difficulty urinating;   Skin: No rashes  Psychological: No history of anxiety,  No history of depression   Physical Examination  Filed Vitals:   06/07/12 1057  BP: 150/92  Pulse: 100  Resp: 16  Height: 5' 11" (1.803 m)  Weight: 165 lb (74.844 kg)  SpO2: 100%    Body mass index is 23.01 kg/(m^2).  General:  Alert and oriented, no acute distress HEENT: Normal Neck: No bruit or JVD Pulmonary: Clear to auscultation bilaterally Cardiac: Regular Rate and Rhythm without murmur Abdomen: Soft, non-tender, non-distended, no mass Skin: No rash, left foot rubor Extremity Pulses:  2+ radial, brachial, femoral, absent popliteal dorsalis pedis, posterior tibial pulses bilaterally Musculoskeletal: No deformity or edema  Neurologic: Upper and lower extremity motor 5/5 and symmetric  DATA: The patient had a lower extremity arterial duplex today. This shows that the left than peroneal bypass is occluded. His ABI today was 0.27. ABI on the right was 0.60.   ASSESSMENT:  Occluded left than peroneal bypass. The patient currently has rest pain. He clearly has a threatened limb. The history suggests this is passed the period to consider lysis. I discussed with the patient today the high possibility of limb loss.   PLAN:  Aortogram with bilateral lower extremity runoff possible intervention by Dr. Dickson on Monday, November 4. Risks benefits possible complications and procedure details were explained to the patient today. He was also given a prescription for Percocet 10/325 #40 dispensed. He was also told to discontinue his Ultram and Vicodin while he was taking the Percocet.  Winnie Umali, MD [facility] [place] Office: [phone] Pager: [phone]  

## 2012-06-11 NOTE — Interval H&P Note (Signed)
History and Physical Interval Note:  06/11/2012 7:24 AM  Andrew [name]  has presented today for surgery, with the diagnosis of PVD  The various methods of treatment have been discussed with the patient and family. After consideration of risks, benefits and other options for treatment, the patient has consented to  Procedure(s) (LRB) with comments: ABDOMINAL AORTAGRAM (N/A).  The patient's history has been reviewed, patient examined, no change in status, stable for surgery.  I have reviewed the patient's chart and labs.  Questions were answered to the patient's satisfaction.     DICKSON,CHRISTOPHER S

## 2012-06-11 NOTE — Op Note (Deleted)
PATIENT: Andrew [name]   MRN: [id number] DOB: 1955/11/12    DATE OF PROCEDURE: 06/11/2012  INDICATIONS: Andrew [name] is a 56 y.o. male who had a brachiocephalic fistula placed which occluded. He is brought in for diagnostic venogram in order to plan future access.  PROCEDURE: venogram left upper extremity  SURGEON: Di Kindle. Edilia Bo, MD, FACS  ANESTHESIA: local   EBL: minimal  TECHNIQUE: The patient was brought to the peripheral vascular lab after a peripheral IV was started in the distal left forearm cephalic vein. Contrast was injected through the catheter and the veins were visualized from the wrist to the chest.  FINDINGS: the forearm cephalic vein is fairly small and branches below the antecubital level into 2 smaller branches. The basilic vein is patent in the forearm and in the upper arm. The patient has a bifid system. The basilic veins joined brachial vein in the axilla. No clear central venous stenosis is identified. The patient has a left IJ central venous catheter which makes visualization of the left brachiocephalic vein difficult however there is contrast which is able to pass by the catheter.  [name], MD, FACS [facility]  DATE OF DICTATION:   06/11/2012

## 2012-06-11 NOTE — Progress Notes (Signed)
VASCULAR LAB PRELIMINARY  PRELIMINARY  PRELIMINARY  PRELIMINARY  Left Lower Extremity Vein Map    Left Great Saphenous Vein   Segment Diameter Comment  1. Origin mm   2. High Thigh mm   3. Mid Thigh mm   4. Low Thigh mm   5. At Knee mm   6. Mid calf below harvest scar 3.1 mm   7. Low Calf 3.7 mm branch  8. Ankle 3.2 mm    mm    mm    mm     Left Small Saphenous Vein  Segment Diameter Comment  1. Origin 4.6 mm   2. Pop fossa 3.7 mm branch  3. High Calf 2.8 mm branch  4. Mid calf 2.4 mm   5. Low calf 4.0 mm branch  6. Above ankle 3.0 mm    mm     Kayan Blissett, 06/11/2012, 10:59 AM

## 2012-06-12 ENCOUNTER — Ambulatory Visit (HOSPITAL_COMMUNITY): Payer: Medicare Other

## 2012-06-12 ENCOUNTER — Encounter (HOSPITAL_COMMUNITY)
Admission: RE | Disposition: A | Discharge status: Home / Self Care | Payer: Self-pay | Source: Ambulatory Visit | Attending: Vascular Surgery

## 2012-06-12 ENCOUNTER — Encounter (HOSPITAL_COMMUNITY): Payer: Self-pay | Admitting: *Deleted

## 2012-06-12 ENCOUNTER — Encounter (HOSPITAL_COMMUNITY): Payer: Self-pay | Admitting: Anesthesiology

## 2012-06-12 ENCOUNTER — Inpatient Hospital Stay (HOSPITAL_COMMUNITY): Admission: RE | Admit: 2012-06-12 | Payer: Medicare Other | Source: Ambulatory Visit | Admitting: Vascular Surgery

## 2012-06-12 ENCOUNTER — Ambulatory Visit (HOSPITAL_COMMUNITY): Payer: Medicare Other | Admitting: Anesthesiology

## 2012-06-12 DIAGNOSIS — I739 Peripheral vascular disease, unspecified: Secondary | ICD-10-CM

## 2012-06-12 DIAGNOSIS — T82898A Other specified complication of vascular prosthetic devices, implants and grafts, initial encounter: Secondary | ICD-10-CM

## 2012-06-12 HISTORY — PX: BYPASS GRAFT FEMORAL-PERONEAL: SHX5762

## 2012-06-12 LAB — CBC
HCT: 34.4 % — ABNORMAL LOW (ref 39.0–52.0)
HCT: 32.4 % — ABNORMAL LOW (ref 39.0–52.0)
Hemoglobin: 11.7 g/dL — ABNORMAL LOW (ref 13.0–17.0)
Hemoglobin: 10.8 g/dL — ABNORMAL LOW (ref 13.0–17.0)
MCH: 29.7 pg (ref 26.0–34.0)
MCHC: 34 g/dL (ref 30.0–36.0)
MCV: 89.1 fL (ref 78.0–100.0)
MCV: 89 fL (ref 78.0–100.0)
Platelets: 221 10*3/uL (ref 150–400)
RBC: 3.64 MIL/uL — ABNORMAL LOW (ref 4.22–5.81)
WBC: 11 10*3/uL — ABNORMAL HIGH (ref 4.0–10.5)

## 2012-06-12 LAB — BASIC METABOLIC PANEL
BUN: 6 mg/dL (ref 6–23)
CO2: 27 mEq/L (ref 19–32)
Calcium: 8.9 mg/dL (ref 8.4–10.5)
Chloride: 99 mEq/L (ref 96–112)
Creatinine, Ser: 0.64 mg/dL (ref 0.50–1.35)
Glucose, Bld: 139 mg/dL — ABNORMAL HIGH (ref 70–99)

## 2012-06-12 SURGERY — CREATION, BYPASS, ARTERIAL, FEMORAL TO PERONEAL, USING GRAFT
Anesthesia: General | Site: Leg Upper | Laterality: Left | Wound class: Clean

## 2012-06-12 MED ORDER — PROTAMINE SULFATE 10 MG/ML IV SOLN
INTRAVENOUS | Status: DC | PRN
  Administered 2012-06-12: 10 mg via INTRAVENOUS
  Administered 2012-06-12: 20 mg via INTRAVENOUS
  Administered 2012-06-12: 10 mg via INTRAVENOUS

## 2012-06-12 MED ORDER — NEOSTIGMINE METHYLSULFATE 1 MG/ML IJ SOLN
INTRAMUSCULAR | Status: DC | PRN
  Administered 2012-06-12: 3 mg via INTRAVENOUS

## 2012-06-12 MED ORDER — ACETAMINOPHEN 325 MG PO TABS: 325.0000 mg | ORAL_TABLET | ORAL | Status: DC | PRN

## 2012-06-12 MED ORDER — ASPIRIN 81 MG PO CHEW
81.0000 mg | CHEWABLE_TABLET | Freq: Every day | ORAL | Status: DC
  Administered 2012-06-13 – 2012-06-14 (×2): 81 mg via ORAL
  Filled 2012-06-12 (×3): qty 1

## 2012-06-12 MED ORDER — HYDRALAZINE HCL 20 MG/ML IJ SOLN
10.0000 mg | INTRAMUSCULAR | Status: DC | PRN
  Filled 2012-06-12: qty 0.5

## 2012-06-12 MED ORDER — HYDROMORPHONE HCL PF 1 MG/ML IJ SOLN
0.2500 mg | INTRAMUSCULAR | Status: DC | PRN
  Administered 2012-06-12 (×4): 0.5 mg via INTRAVENOUS

## 2012-06-12 MED ORDER — HEPARIN SODIUM (PORCINE) 1000 UNIT/ML IJ SOLN
INTRAMUSCULAR | Status: DC | PRN
  Administered 2012-06-12: 7000 [IU] via INTRAVENOUS

## 2012-06-12 MED ORDER — ACETAMINOPHEN 650 MG RE SUPP: 325.0000 mg | RECTAL | Status: DC | PRN

## 2012-06-12 MED ORDER — GLYCOPYRROLATE 0.2 MG/ML IJ SOLN
INTRAMUSCULAR | Status: DC | PRN
  Administered 2012-06-12: 0.4 mg via INTRAVENOUS

## 2012-06-12 MED ORDER — DOCUSATE SODIUM 100 MG PO CAPS
100.0000 mg | ORAL_CAPSULE | Freq: Every day | ORAL | Status: DC
  Administered 2012-06-13 – 2012-06-14 (×2): 100 mg via ORAL
  Filled 2012-06-12 (×2): qty 1

## 2012-06-12 MED ORDER — HYDROCHLOROTHIAZIDE 25 MG PO TABS
25.0000 mg | ORAL_TABLET | Freq: Every day | ORAL | Status: DC
  Administered 2012-06-12 – 2012-06-14 (×3): 25 mg via ORAL
  Filled 2012-06-12 (×3): qty 1

## 2012-06-12 MED ORDER — CLONAZEPAM 1 MG PO TABS
1.0000 mg | ORAL_TABLET | Freq: Three times a day (TID) | ORAL | Status: DC | PRN
  Administered 2012-06-14 (×2): 1 mg via ORAL
  Filled 2012-06-12 (×2): qty 1

## 2012-06-12 MED ORDER — METOCLOPRAMIDE HCL 5 MG/ML IJ SOLN: 10.0000 mg | Freq: Once | INTRAMUSCULAR | Status: DC | PRN

## 2012-06-12 MED ORDER — OXYCODONE HCL 5 MG PO TABS: 5.0000 mg | ORAL_TABLET | Freq: Once | ORAL | Status: DC | PRN

## 2012-06-12 MED ORDER — OXYCODONE HCL 5 MG/5ML PO SOLN: 5.0000 mg | Freq: Once | ORAL | Status: DC | PRN

## 2012-06-12 MED ORDER — MULTI-VITAMIN/MINERALS PO TABS: 1.0000 | ORAL_TABLET | Freq: Every day | ORAL | Status: DC

## 2012-06-12 MED ORDER — SODIUM CHLORIDE 0.9 % IV SOLN: 500.0000 mL | Freq: Once | INTRAVENOUS | Status: AC | PRN

## 2012-06-12 MED ORDER — SODIUM CHLORIDE 0.9 % IR SOLN
Status: DC | PRN
  Administered 2012-06-12: 09:00:00

## 2012-06-12 MED ORDER — IOHEXOL 300 MG/ML  SOLN
INTRAMUSCULAR | Status: DC | PRN
  Administered 2012-06-12: 50 mL via INTRAVENOUS

## 2012-06-12 MED ORDER — OXYCODONE HCL 5 MG PO TABS
5.0000 mg | ORAL_TABLET | ORAL | Status: DC | PRN
  Administered 2012-06-13 – 2012-06-14 (×7): 5 mg via ORAL
  Filled 2012-06-12 (×7): qty 1

## 2012-06-12 MED ORDER — LABETALOL HCL 5 MG/ML IV SOLN
10.0000 mg | INTRAVENOUS | Status: DC | PRN
  Filled 2012-06-12: qty 4

## 2012-06-12 MED ORDER — PANTOPRAZOLE SODIUM 40 MG PO TBEC
40.0000 mg | DELAYED_RELEASE_TABLET | Freq: Every day | ORAL | Status: DC
  Administered 2012-06-13 – 2012-06-14 (×2): 40 mg via ORAL
  Filled 2012-06-12 (×2): qty 1

## 2012-06-12 MED ORDER — TRAZODONE HCL 100 MG PO TABS
100.0000 mg | ORAL_TABLET | Freq: Every evening | ORAL | Status: DC | PRN
  Filled 2012-06-12: qty 1

## 2012-06-12 MED ORDER — THROMBIN 20000 UNITS EX SOLR
CUTANEOUS | Status: DC | PRN
  Administered 2012-06-12: 11:00:00 via TOPICAL

## 2012-06-12 MED ORDER — PROPOFOL 10 MG/ML IV BOLUS
INTRAVENOUS | Status: DC | PRN
  Administered 2012-06-12: 200 mg via INTRAVENOUS

## 2012-06-12 MED ORDER — SODIUM CHLORIDE 0.9 % IV SOLN: INTRAVENOUS | Status: DC

## 2012-06-12 MED ORDER — FENTANYL CITRATE 0.05 MG/ML IJ SOLN
INTRAMUSCULAR | Status: DC | PRN
  Administered 2012-06-12: 25 ug via INTRAVENOUS
  Administered 2012-06-12 (×2): 50 ug via INTRAVENOUS
  Administered 2012-06-12: 25 ug via INTRAVENOUS
  Administered 2012-06-12: 100 ug via INTRAVENOUS

## 2012-06-12 MED ORDER — ONDANSETRON HCL 4 MG/2ML IJ SOLN: 4.0000 mg | Freq: Four times a day (QID) | INTRAMUSCULAR | Status: DC | PRN

## 2012-06-12 MED ORDER — METOPROLOL TARTRATE 50 MG PO TABS
50.0000 mg | ORAL_TABLET | Freq: Two times a day (BID) | ORAL | Status: DC
  Administered 2012-06-12 – 2012-06-14 (×4): 50 mg via ORAL
  Filled 2012-06-12 (×5): qty 1

## 2012-06-12 MED ORDER — ONDANSETRON HCL 4 MG/2ML IJ SOLN
INTRAMUSCULAR | Status: DC | PRN
  Administered 2012-06-12: 4 mg via INTRAVENOUS

## 2012-06-12 MED ORDER — ROCURONIUM BROMIDE 100 MG/10ML IV SOLN
INTRAVENOUS | Status: DC | PRN
  Administered 2012-06-12: 10 mg via INTRAVENOUS
  Administered 2012-06-12: 20 mg via INTRAVENOUS
  Administered 2012-06-12: 10 mg via INTRAVENOUS
  Administered 2012-06-12: 50 mg via INTRAVENOUS

## 2012-06-12 MED ORDER — ENALAPRIL MALEATE 10 MG PO TABS
10.0000 mg | ORAL_TABLET | Freq: Every day | ORAL | Status: DC
  Administered 2012-06-13 – 2012-06-14 (×2): 10 mg via ORAL
  Filled 2012-06-12 (×2): qty 1

## 2012-06-12 MED ORDER — POTASSIUM CHLORIDE CRYS ER 20 MEQ PO TBCR: 20.0000 meq | EXTENDED_RELEASE_TABLET | Freq: Once | ORAL | Status: AC | PRN

## 2012-06-12 MED ORDER — OXYCODONE-ACETAMINOPHEN 10-325 MG PO TABS: 1.0000 | ORAL_TABLET | ORAL | Status: DC | PRN

## 2012-06-12 MED ORDER — METOPROLOL TARTRATE 1 MG/ML IV SOLN: 2.0000 mg | INTRAVENOUS | Status: DC | PRN

## 2012-06-12 MED ORDER — HEPARIN (PORCINE) IN NACL 100-0.45 UNIT/ML-% IJ SOLN
400.0000 [IU]/h | INTRAMUSCULAR | Status: DC
  Administered 2012-06-12: 400 [IU]/h via INTRAVENOUS
  Filled 2012-06-12: qty 250

## 2012-06-12 MED ORDER — DEXTROSE 5 % IV SOLN
1.5000 g | Freq: Once | INTRAVENOUS | Status: AC
  Administered 2012-06-12: 1.5 g via INTRAVENOUS
  Filled 2012-06-12: qty 1.5

## 2012-06-12 MED ORDER — ALUM & MAG HYDROXIDE-SIMETH 200-200-20 MG/5ML PO SUSP: 15.0000 mL | ORAL | Status: DC | PRN

## 2012-06-12 MED ORDER — DEXAMETHASONE SODIUM PHOSPHATE 10 MG/ML IJ SOLN
INTRAMUSCULAR | Status: DC | PRN
  Administered 2012-06-12: 4 mg via INTRAVENOUS

## 2012-06-12 MED ORDER — DEXTROSE 5 % IV SOLN
1.5000 g | Freq: Two times a day (BID) | INTRAVENOUS | Status: AC
  Administered 2012-06-12 – 2012-06-13 (×2): 1.5 g via INTRAVENOUS
  Filled 2012-06-12 (×2): qty 1.5

## 2012-06-12 MED ORDER — 0.9 % SODIUM CHLORIDE (POUR BTL) OPTIME
TOPICAL | Status: DC | PRN
  Administered 2012-06-12: 1000 mL

## 2012-06-12 MED ORDER — GUAIFENESIN-DM 100-10 MG/5ML PO SYRP: 15.0000 mL | ORAL_SOLUTION | ORAL | Status: DC | PRN

## 2012-06-12 MED ORDER — HYDROMORPHONE HCL PF 1 MG/ML IJ SOLN
INTRAMUSCULAR | Status: AC
  Filled 2012-06-12: qty 1

## 2012-06-12 MED ORDER — SERTRALINE HCL 100 MG PO TABS
100.0000 mg | ORAL_TABLET | Freq: Every day | ORAL | Status: DC
  Administered 2012-06-12 – 2012-06-14 (×3): 100 mg via ORAL
  Filled 2012-06-12 (×3): qty 1

## 2012-06-12 MED ORDER — DOPAMINE-DEXTROSE 3.2-5 MG/ML-% IV SOLN: 3.0000 ug/kg/min | INTRAVENOUS | Status: DC

## 2012-06-12 MED ORDER — OXYCODONE-ACETAMINOPHEN 5-325 MG PO TABS
1.0000 | ORAL_TABLET | ORAL | Status: DC | PRN
  Administered 2012-06-13 – 2012-06-14 (×7): 1 via ORAL
  Filled 2012-06-12 (×7): qty 1

## 2012-06-12 MED ORDER — LACTATED RINGERS IV SOLN
INTRAVENOUS | Status: DC | PRN
  Administered 2012-06-12 (×3): via INTRAVENOUS

## 2012-06-12 MED ORDER — ADULT MULTIVITAMIN W/MINERALS CH
1.0000 | ORAL_TABLET | Freq: Every day | ORAL | Status: DC
  Administered 2012-06-13 – 2012-06-14 (×2): 1 via ORAL
  Filled 2012-06-12 (×3): qty 1

## 2012-06-12 MED ORDER — PHENYLEPHRINE HCL 10 MG/ML IJ SOLN
INTRAMUSCULAR | Status: DC | PRN
  Administered 2012-06-12 (×3): 40 ug via INTRAVENOUS
  Administered 2012-06-12: 80 ug via INTRAVENOUS
  Administered 2012-06-12 (×2): 40 ug via INTRAVENOUS

## 2012-06-12 MED ORDER — SODIUM CHLORIDE 0.9 % IV SOLN
INTRAVENOUS | Status: DC
  Administered 2012-06-12: 75 mL/h via INTRAVENOUS

## 2012-06-12 MED ORDER — SIMVASTATIN 40 MG PO TABS
40.0000 mg | ORAL_TABLET | Freq: Every day | ORAL | Status: DC
  Administered 2012-06-12 – 2012-06-13 (×2): 40 mg via ORAL
  Filled 2012-06-12 (×3): qty 1

## 2012-06-12 MED ORDER — LIDOCAINE HCL (CARDIAC) 20 MG/ML IV SOLN
INTRAVENOUS | Status: DC | PRN
  Administered 2012-06-12: 100 mg via INTRAVENOUS

## 2012-06-12 MED ORDER — TRAMADOL HCL 50 MG PO TABS
100.0000 mg | ORAL_TABLET | Freq: Four times a day (QID) | ORAL | Status: DC | PRN
  Filled 2012-06-12: qty 2

## 2012-06-12 MED ORDER — PHENOL 1.4 % MT LIQD: 1.0000 | OROMUCOSAL | Status: DC | PRN

## 2012-06-12 MED ORDER — MORPHINE SULFATE 2 MG/ML IJ SOLN
2.0000 mg | INTRAMUSCULAR | Status: DC | PRN
  Administered 2012-06-12 – 2012-06-13 (×3): 4 mg via INTRAVENOUS
  Administered 2012-06-13: 2 mg via INTRAVENOUS
  Filled 2012-06-12: qty 2
  Filled 2012-06-12: qty 1
  Filled 2012-06-12 (×3): qty 2

## 2012-06-12 SURGICAL SUPPLY — 72 items
ADH SKN CLS APL DERMABOND .7 (GAUZE/BANDAGES/DRESSINGS) ×3
BANDAGE ELASTIC 4 VELCRO ST LF (GAUZE/BANDAGES/DRESSINGS) IMPLANT
BANDAGE ESMARK 6X9 LF (GAUZE/BANDAGES/DRESSINGS) IMPLANT
BNDG CMPR 9X6 STRL LF SNTH (GAUZE/BANDAGES/DRESSINGS) ×1
BNDG ESMARK 6X9 LF (GAUZE/BANDAGES/DRESSINGS) ×2
CANISTER SUCTION 2500CC (MISCELLANEOUS) ×2 IMPLANT
CANNULA VESSEL W/WING WO/VALVE (CANNULA) ×2 IMPLANT
CATH EMB 4FR 80CM (CATHETERS) ×1 IMPLANT
CLIP TI MEDIUM 24 (CLIP) ×2 IMPLANT
CLIP TI WIDE RED SMALL 24 (CLIP) ×2 IMPLANT
CLOTH BEACON ORANGE TIMEOUT ST (SAFETY) ×2 IMPLANT
COVER SURGICAL LIGHT HANDLE (MISCELLANEOUS) ×2 IMPLANT
CUFF TOURNIQUET SINGLE 18IN (TOURNIQUET CUFF) IMPLANT
CUFF TOURNIQUET SINGLE 24IN (TOURNIQUET CUFF) ×1 IMPLANT
CUFF TOURNIQUET SINGLE 34IN LL (TOURNIQUET CUFF) IMPLANT
CUFF TOURNIQUET SINGLE 44IN (TOURNIQUET CUFF) IMPLANT
DERMABOND ADVANCED (GAUZE/BANDAGES/DRESSINGS) ×3
DERMABOND ADVANCED .7 DNX12 (GAUZE/BANDAGES/DRESSINGS) IMPLANT
DRAIN CHANNEL 15F RND FF W/TCR (WOUND CARE) IMPLANT
DRAPE WARM FLUID 44X44 (DRAPE) ×2 IMPLANT
DRAPE X-RAY CASS 24X20 (DRAPES) ×1 IMPLANT
DRSG COVADERM 4X10 (GAUZE/BANDAGES/DRESSINGS) IMPLANT
DRSG COVADERM 4X6 (GAUZE/BANDAGES/DRESSINGS) ×1 IMPLANT
DRSG COVADERM 4X8 (GAUZE/BANDAGES/DRESSINGS) IMPLANT
ELECT REM PT RETURN 9FT ADLT (ELECTROSURGICAL) ×2
ELECTRODE REM PT RTRN 9FT ADLT (ELECTROSURGICAL) ×1 IMPLANT
EVACUATOR SILICONE 100CC (DRAIN) IMPLANT
GAUZE SPONGE 4X4 16PLY XRAY LF (GAUZE/BANDAGES/DRESSINGS) ×1 IMPLANT
GLOVE BIO SURGEON STRL SZ 6.5 (GLOVE) ×2 IMPLANT
GLOVE BIO SURGEON STRL SZ7.5 (GLOVE) ×2 IMPLANT
GLOVE BIOGEL PI IND STRL 6.5 (GLOVE) IMPLANT
GLOVE BIOGEL PI IND STRL 7.5 (GLOVE) IMPLANT
GLOVE BIOGEL PI IND STRL 8 (GLOVE) ×1 IMPLANT
GLOVE BIOGEL PI INDICATOR 6.5 (GLOVE) ×2
GLOVE BIOGEL PI INDICATOR 7.5 (GLOVE) ×2
GLOVE BIOGEL PI INDICATOR 8 (GLOVE) ×1
GLOVE ECLIPSE 6.0 STRL STRAW (GLOVE) ×1 IMPLANT
GLOVE SURG SS PI 7.5 STRL IVOR (GLOVE) ×2 IMPLANT
GOWN PREVENTION PLUS XLARGE (GOWN DISPOSABLE) ×1 IMPLANT
GOWN STRL NON-REIN LRG LVL3 (GOWN DISPOSABLE) ×5 IMPLANT
GOWN STRL REIN XL XLG (GOWN DISPOSABLE) ×1 IMPLANT
GRAFT PROPATEN THIN WALL 6X80 (Vascular Products) ×1 IMPLANT
INSERT FOGARTY SM (MISCELLANEOUS) ×1 IMPLANT
KIT BASIN OR (CUSTOM PROCEDURE TRAY) ×2 IMPLANT
KIT ROOM TURNOVER OR (KITS) ×2 IMPLANT
MARKER GRAFT CORONARY BYPASS (MISCELLANEOUS) IMPLANT
NS IRRIG 1000ML POUR BTL (IV SOLUTION) ×4 IMPLANT
PACK PERIPHERAL VASCULAR (CUSTOM PROCEDURE TRAY) ×2 IMPLANT
PAD ARMBOARD 7.5X6 YLW CONV (MISCELLANEOUS) ×4 IMPLANT
PADDING CAST COTTON 6X4 STRL (CAST SUPPLIES) IMPLANT
SET COLLECT BLD 21X3/4 12 (NEEDLE) ×1 IMPLANT
SPONGE LAP 18X18 X RAY DECT (DISPOSABLE) ×1 IMPLANT
SPONGE SURGIFOAM ABS GEL 100 (HEMOSTASIS) ×1 IMPLANT
STAPLER VISISTAT 35W (STAPLE) IMPLANT
STOPCOCK 4 WAY LG BORE MALE ST (IV SETS) ×1 IMPLANT
SUT PROLENE 5 0 C 1 24 (SUTURE) ×2 IMPLANT
SUT PROLENE 6 0 BV (SUTURE) ×5 IMPLANT
SUT PROLENE 7 0 BV 1 (SUTURE) ×2 IMPLANT
SUT SILK 2 0 SH (SUTURE) ×2 IMPLANT
SUT SILK 3 0 (SUTURE) ×6
SUT SILK 3-0 18XBRD TIE 12 (SUTURE) IMPLANT
SUT VIC AB 2-0 CTB1 (SUTURE) ×4 IMPLANT
SUT VIC AB 3-0 SH 27 (SUTURE) ×6
SUT VIC AB 3-0 SH 27X BRD (SUTURE) ×2 IMPLANT
SUT VICRYL 4-0 PS2 18IN ABS (SUTURE) ×5 IMPLANT
SYR 3ML LL SCALE MARK (SYRINGE) ×1 IMPLANT
TOWEL OR 17X24 6PK STRL BLUE (TOWEL DISPOSABLE) ×5 IMPLANT
TOWEL OR 17X26 10 PK STRL BLUE (TOWEL DISPOSABLE) ×4 IMPLANT
TRAY FOLEY CATH 14FRSI W/METER (CATHETERS) ×2 IMPLANT
TUBING EXTENTION W/L.L. (IV SETS) ×1 IMPLANT
UNDERPAD 30X30 INCONTINENT (UNDERPADS AND DIAPERS) ×2 IMPLANT
WATER STERILE IRR 1000ML POUR (IV SOLUTION) ×2 IMPLANT

## 2012-06-12 NOTE — Interval H&P Note (Signed)
History and Physical Interval Note:  06/12/2012 7:14 AM  Andrew [name]  has presented today for surgery, with the diagnosis of PVD  The various methods of treatment have been discussed with the patient and family. After consideration of risks, benefits and other options for treatment, the patient has consented to  Procedure(s) (LRB) with comments: BYPASS GRAFT FEMORAL-PERONEAL (Left) - REDO POSSIBLE USING LEFT LESSER SAPHENOUS VEIN as a surgical intervention .  The patient's history has been reviewed, patient examined, no change in status, stable for surgery.  I have reviewed the patient's chart and labs.  Questions were answered to the patient's satisfaction.     DICKSON,CHRISTOPHER S

## 2012-06-12 NOTE — Transfer of Care (Signed)
Immediate Anesthesia Transfer of Care Note  Patient: Andrew [name]  Procedure(s) Performed: Procedure(s) (LRB) with comments: BYPASS GRAFT FEMORAL-PERONEAL (Left) - Redo fermoral - peroneal artery bypass using     composite propaten 6mm x 80cm and left saphenous vein.  Patient Location: PACU  Anesthesia Type:General  Level of Consciousness: awake, alert  and oriented  Airway & Oxygen Therapy: Patient Spontanous Breathing and Patient connected to nasal cannula oxygen  Post-op Assessment: Report given to PACU RN, Post -op Vital signs reviewed and stable and Patient moving all extremities X 4  Post vital signs: Reviewed and stable  Complications: No apparent anesthesia complications

## 2012-06-12 NOTE — Anesthesia Preprocedure Evaluation (Addendum)
Anesthesia Evaluation  Patient identified by MRN, date of birth, ID band Patient awake    Reviewed: Allergy & Precautions, H&P , NPO status , Patient's Chart, lab work & pertinent test results, reviewed documented beta blocker date and time   Airway Mallampati: II TM Distance: >3 FB Neck ROM: full    Dental  (+) Poor Dentition, Missing, Loose, Chipped and Dental Advisory Given   Pulmonary COPD breath sounds clear to auscultation        Cardiovascular hypertension, Pt. on medications and Pt. on home beta blockers + Peripheral Vascular Disease Rhythm:regular     Neuro/Psych PSYCHIATRIC DISORDERS Anxiety negative neurological ROS     GI/Hepatic negative GI ROS, Neg liver ROS,   Endo/Other  diabetes (borderline, on non meds)  Renal/GU negative Renal ROS  negative genitourinary   Musculoskeletal   Abdominal   Peds  Hematology negative hematology ROS (+)   Anesthesia Other Findings See surgeon's H&P   Reproductive/Obstetrics negative OB ROS                          Anesthesia Physical Anesthesia Plan  ASA: III  Anesthesia Plan: General   Post-op Pain Management:    Induction: Intravenous  Airway Management Planned: Oral ETT  Additional Equipment:   Intra-op Plan:   Post-operative Plan: Extubation in OR  Informed Consent: I have reviewed the patients History and Physical, chart, labs and discussed the procedure including the risks, benefits and alternatives for the proposed anesthesia with the patient or authorized representative who has indicated his/her understanding and acceptance.   Dental Advisory Given and Dental advisory given  Plan Discussed with: CRNA, Surgeon and Anesthesiologist  Anesthesia Plan Comments:        Anesthesia Quick Evaluation

## 2012-06-12 NOTE — Progress Notes (Signed)
Utilization review completed.  

## 2012-06-12 NOTE — Anesthesia Procedure Notes (Signed)
Procedure Name: Intubation Date/Time: 06/12/2012 7:35 AM Performed by: Arlice Colt B Pre-anesthesia Checklist: Patient identified, Emergency Drugs available, Suction available, Patient being monitored and Timeout performed Patient Re-evaluated:Patient Re-evaluated prior to inductionOxygen Delivery Method: Circle system utilized Preoxygenation: Pre-oxygenation with 100% oxygen Intubation Type: IV induction Ventilation: Mask ventilation without difficulty Laryngoscope Size: Mac and 3 Grade View: Grade II Tube type: Oral Tube size: 7.5 mm Number of attempts: 1 Airway Equipment and Method: Stylet Secured at: 23 cm Tube secured with: Tape Dental Injury: Teeth and Oropharynx as per pre-operative assessment

## 2012-06-12 NOTE — Progress Notes (Signed)
VASCULAR PROGRESS NOTE  SUBJECTIVE: Comfortable  PHYSICAL EXAM: Filed Vitals:   06/12/12 1439 06/12/12 1440 06/12/12 1511 06/12/12 1600  BP:  121/65 153/88 127/75  Pulse:  75    Temp: 97.3 F (36.3 C)  98.9 F (37.2 C) 98.3 F (36.8 C)  TempSrc:   Oral Oral  Resp:  12    Height:   5\' 11"  (1.803 m)   Weight:   162 lb 4.1 oz (73.6 kg)   SpO2:  97%     Left foot hyperemic Incisions fine  LABS: Lab Results  Component Value Date   WBC 12.2* 06/12/2012   HGB 11.7* 06/12/2012   HCT 34.4* 06/12/2012   MCV 89.1 06/12/2012   PLT 215 06/12/2012   Lab Results  Component Value Date   CREATININE 0.81 06/11/2012   Lab Results  Component Value Date   INR 1.00 06/11/2012   CBG (last 3)   Basename 06/12/12 1424 06/11/12 0835  GLUCAP 136* 100*     ASSESSMENT/PLAN: Stable postop  [name], MD, FACS Beeper: [phone] 06/12/2012

## 2012-06-12 NOTE — Op Note (Signed)
NAME: LUCILLE CRICHLOW   MRN: [id number] DOB: [birth year]    DATE OF OPERATION: 06/12/2012  PREOP DIAGNOSIS: Left lower extremity ischemia  POSTOP DIAGNOSIS: Same   PROCEDURE: redo left femoral to peroneal artery bypass graft with composite propaten graft with a segment of left greater saphenous vein and intraoperative arteriogram  SURGEON: Di Kindle. Edilia Bo, MD, FACS  ASSIST: Doreatha Massed, PA  ANESTHESIA: Gen.   EBL: 200 cc  INDICATIONS: Andrew [name] is a 56 y.o. male who had undergone previous aorto bifemoral bypass graft and left them peroneal artery bypass graft in 2003 by Dr. Madilyn Fireman. He presented with an occluded graft which likely occluded approximately 3 weeks ago. He had rest pain of the left foot and limb threatening ischemia. I felt the best option was redo fem peroneal artery bypass graft.  FINDINGS: the peroneal artery was 2 mm.  TECHNIQUE: The patient was brought to the operating room and received a general anesthetic. Incision was made in the left groin. Through scar tissue the left limb of the aortobifemoral bypass graft, left common femoral artery, superficial femoral artery, femoropopliteal bypass graft, and femoral artery were all controlled with vessel loops. The graft was occluded. X-ray longitudinal incision in the midcalf the soleus muscle was divided allowing exposure of the posterior tibial neurovascular bundle which was retracted posteriorly. This allowed exposure of the peroneal artery which was posterior to the peroneal vein. The artery was very small approximately 2 mm. The remaining greater saphenous vein in the left leg was harvested through a longitudinal incision beginning at the medial malleolus. An approximately 15 cm segment of vein was harvested. A 6 mm PTFE graft was brought to the field and spatulated. A segment of vein was used in a reverse fashion. The vein was spatulated and sewn into and to the graft using 2 continuous 6-0 Prolene sutures. A tunnel  was then created from the incision over the peroneal artery to the groin and the graft was tunneled. The patient was then heparinized.  Attention was first turned to the proximal anastomosis. All 3 vessels were controlled and then a longitudinal venotomy made in the proximal bypass graft. The clot within the graft appeared to be chronic and not acute. The graft likely occluded 3-4 weeks ago. I passed a Fogarty catheter but there was some diffuse irregularity within the vein I did not think this could be salvaged. The proximal vein was excised from the left limb of the aortobifemoral graft. A 6 mm PTFE was spatulated and sewn end to side to the distal aspect of the Dacron graft using continuous 6-0 Prolene suture. This graft and poor the appropriate length for anastomosis to the peroneal artery. A tourniquet was placed on the upper thigh and the leg exsanguinated with an Esmarch bandage. The tourniquet was inflated to 250 mm mercury. Under tourniquet control, a longitudinal arteriotomy was made in the peroneal artery. The vein segment of the composite graft was spatulated and sewn end-to-side to the peroneal artery using 2 continuous 6-0 Prolene sutures parachuting both the heel and toe of the anastomosis. I was able to pass a 2 mm dilator in both directions through the peroneal artery. Prior to completing the anastomosis, the tourniquet was released and the arteries were backbled and flushed. The anastomosis was completed. Flow is reestablished to the left foot. Completion arteriogram was obtained which showed some spasm at the anastomotic site but good flow through the peroneal artery which is the only patent vessel. The heparin was partially reversed  with protamine. Hemostasis was obtained the groin wound. This wound is closed with deep layer of 2-0 Vicryl. The subcutaneous layer was closed with 3-0 Vicryl and the skin closed with a 4-0 silk stitch. The incision over the peroneal artery was closed with a deep  layer of 3-0 Vicryl, subcutaneous layer 3-0 Vicryl, and the skin was closed with 4-0 subcuticular stitch. The vein harvest site was closed with deep layer 3-0 Vicryl and the skin closed with 4-0 Vicryl. Dermabond was applied. The patient tolerated the procedure well and was transferred to recovery in stable condition. All needle and sponge counts were correct.  At the completion there was a good peroneal and posterior tibial signal with the Doppler which was graft dependent.  [name], MD, FACS [facility]  DATE OF DICTATION:   06/12/2012

## 2012-06-12 NOTE — Anesthesia Postprocedure Evaluation (Signed)
Anesthesia Post Note  Patient: Andrew [name]  Procedure(s) Performed: Procedure(s) (LRB): BYPASS GRAFT FEMORAL-PERONEAL (Left)  Anesthesia type: General  Patient location: PACU  Post pain: Pain level controlled  Post assessment: Patient's Cardiovascular Status Stable  Last Vitals:  Filed Vitals:   06/12/12 1207  BP:   Pulse:   Temp: 36.5 C  Resp:     Post vital signs: Reviewed and stable  Level of consciousness: alert  Complications: No apparent anesthesia complications

## 2012-06-12 NOTE — Progress Notes (Signed)
Patient arrived from Pacu to 3302. All vital signs WNL, Patient A&O, sleepy but able to awake. Pulses dopplerable. Willl continue to monitor patient.

## 2012-06-13 ENCOUNTER — Encounter (HOSPITAL_COMMUNITY): Payer: Self-pay | Admitting: Vascular Surgery

## 2012-06-13 DIAGNOSIS — Z48812 Encounter for surgical aftercare following surgery on the circulatory system: Secondary | ICD-10-CM

## 2012-06-13 LAB — GLUCOSE, CAPILLARY: Glucose-Capillary: 135 mg/dL — ABNORMAL HIGH (ref 70–99)

## 2012-06-13 LAB — HEPARIN LEVEL (UNFRACTIONATED): Heparin Unfractionated: <0.1 IU/mL — ABNORMAL LOW (ref 0.30–0.70)

## 2012-06-13 MED ORDER — ENOXAPARIN SODIUM 40 MG/0.4ML ~~LOC~~ SOLN
40.0000 mg | SUBCUTANEOUS | Status: DC
  Administered 2012-06-13 – 2012-06-14 (×2): 40 mg via SUBCUTANEOUS
  Filled 2012-06-13 (×2): qty 0.4

## 2012-06-13 MED ORDER — ENOXAPARIN SODIUM 40 MG/0.4ML ~~LOC~~ SOLN
40.0000 mg | SUBCUTANEOUS | Status: DC
  Filled 2012-06-13: qty 0.4

## 2012-06-13 NOTE — Progress Notes (Signed)
VASCULAR PROGRESS NOTE  SUBJECTIVE: left foot feels much better.  PHYSICAL EXAM: Filed Vitals:   06/12/12 1600 06/12/12 2017 06/12/12 2352 06/13/12 0423  BP: 127/75 117/73 105/68 111/76  Pulse:  77 79 70  Temp: 98.3 F (36.8 C) 98.9 F (37.2 C) 98.3 F (36.8 C) 98.1 F (36.7 C)  TempSrc: Oral Oral Oral Oral  Resp:  13 13 16   Height:      Weight:    162 lb 14.7 oz (73.9 kg)  SpO2:  97% 98% 97%   Good Doppler flow left foot in the dorsalis pedis, posterior tibial, and peroneal positions. All 3 incisions look fine.  LABS: Lab Results  Component Value Date   WBC 11.0* 06/12/2012   HGB 10.8* 06/12/2012   HCT 32.4* 06/12/2012   MCV 89.0 06/12/2012   PLT 221 06/12/2012   Lab Results  Component Value Date   CREATININE 0.64 06/12/2012   Lab Results  Component Value Date   INR 1.00 06/11/2012   CBG (last 3)   Basename 06/12/12 1424 06/11/12 0835  GLUCAP 136* 100*   ASSESSMENT/PLAN: 1. 1 Day Post-Op s/p: Redo left femoropopliteal peroneal bypass graft with composite PTFE and left greater saphenous vein 2. Convert IV heparin to subcutaneous Lovenox 3. Transferred to 2000 4. Ambulate 5. I had a long discussion with him about the importance of tobacco cessation. He understands that we are essentially out of options in the left leg. 6. Anticipate discharge tomorrow.  [name], MD, FACS Beeper: [phone] 06/13/2012

## 2012-06-13 NOTE — Evaluation (Signed)
Occupational Therapy Evaluation Patient Details Name: Andrew [name] MRN: [id number] DOB: 10-Jan-1956 Today's Date: 06/13/2012 Time: 1140-1200 OT Time Calculation (min): 20 min  OT Assessment / Plan / Recommendation Clinical Impression  Pt is 56 y/o male admitted for s/p redo left femoral to peroneal artery bypass graft. Pt presents with pain, decreased balance, generalized weakness and questionable safety awareness. Pt will benefit from skilled OT in the acute setting to maximize I with ADL and ADL mobility prior to d/c    OT Assessment  Patient needs continued OT Services    Follow Up Recommendations  Home health OT;Supervision/Assistance - 24 hour    Barriers to Discharge Decreased caregiver support    Equipment Recommendations  Tub/shower bench    Recommendations for Other Services    Frequency  Min 2X/week    Precautions / Restrictions Precautions Precautions: Fall   Pertinent Vitals/Pain Pt reports 7/10 LLE pain; RN aware. Pt repositioned for pain relief    ADL  Eating/Feeding: Independent Where Assessed - Eating/Feeding: Chair Grooming: Wash/dry hands;Min guard Where Assessed - Grooming: Unsupported standing Upper Body Dressing: Set up Where Assessed - Upper Body Dressing: Unsupported sitting Lower Body Dressing: Minimal assistance Where Assessed - Lower Body Dressing: Supported sit to stand Toilet Transfer: Hydrographic surveyor Method: Sit to Barista: Regular height toilet Toileting - Architect and Hygiene: Min guard Where Assessed - Engineer, mining and Hygiene: Sit to stand from 3-in-1 or toilet Equipment Used: Gait belt;Rolling walker Transfers/Ambulation Related to ADLs: Min guard A with RW ambulation throughout room ADL Comments: question pt's safety with return home as pt with no definite assist available from family- and has a dog to care for    OT Diagnosis: Generalized weakness;Acute pain  OT  Problem List: Decreased activity tolerance;Decreased strength;Impaired balance (sitting and/or standing);Decreased safety awareness;Decreased knowledge of use of DME or AE;Decreased knowledge of precautions;Pain OT Treatment Interventions: Self-care/ADL training;DME and/or AE instruction;Therapeutic activities;Patient/family education;Balance training   OT Goals Acute Rehab OT Goals OT Goal Formulation: With patient Time For Goal Achievement: 06/20/12 Potential to Achieve Goals: Good ADL Goals Pt Will Perform Grooming: Independently;Standing at sink ADL Goal: Grooming - Progress: Goal set today Pt Will Perform Upper Body Dressing: Independently;Sitting, bed;Sitting, chair ADL Goal: Upper Body Dressing - Progress: Goal set today Pt Will Perform Lower Body Dressing: with modified independence;Sit to stand from bed;Sit to stand from chair ADL Goal: Lower Body Dressing - Progress: Goal set today Pt Will Transfer to Toilet: with modified independence;Ambulation;with DME ADL Goal: Toilet Transfer - Progress: Goal set today Pt Will Perform Toileting - Clothing Manipulation: Independently;Standing ADL Goal: Toileting - Clothing Manipulation - Progress: Goal set today Pt Will Perform Tub/Shower Transfer: Tub transfer;Ambulation;with DME;Transfer tub bench ADL Goal: Tub/Shower Transfer - Progress: Goal set today  Visit Information  Last OT Received On: 06/13/12    Subjective Data  Subjective: I don't think I need to stay here for forever, but I also don't want to get kicked out the door.  Patient Stated Goal: Return home   Prior Functioning     Home Living Lives With: Alone (Lives on addition to brothers house; no (A) from brother) Available Help at Discharge:  (none) Type of Home: House Home Access: Stairs to enter [employer] of Steps: 3 Entrance Stairs-Rails: None Home Layout: One level Bathroom Shower/Tub: Forensic scientist: Standard Bathroom  Accessibility: Yes How Accessible: Accessible via walker Home Adaptive Equipment: Walker - rolling Prior Function Level of Independence: Independent  with assistive device(s) Able to Take Stairs?: Yes Driving: Yes Vocation: Retired Musician: No difficulties Dominant Hand: Right         Vision/Perception     Cognition  Overall Cognitive Status: Difficult to assess Difficult to assess due to:  (pt slow to process at times- unsure baseline?`) Arousal/Alertness: Awake/alert Orientation Level: Appears intact for tasks assessed Behavior During Session: Flat affect Cognition - Other Comments: Pt slow to process and question if pt's baseline. Pt difficulty answering hx questions at times and tends to need redirection to task.    Extremity/Trunk Assessment Right Upper Extremity Assessment RUE ROM/Strength/Tone: Within functional levels RUE Sensation: WFL - Light Touch RUE Coordination: WFL - gross/fine motor Left Upper Extremity Assessment LUE ROM/Strength/Tone: Within functional levels LUE Sensation: WFL - Light Touch LUE Coordination: WFL - gross/fine motor     Mobility Bed Mobility Supine to Sit: 5: Supervision Details for Bed Mobility Assistance: Supervision for safety  Transfers Sit to Stand: 4: Min guard;From bed Stand to Sit: To chair/3-in-1;4: Min guard Details for Transfer Assistance: Minguard for safety with cues for hand placement     Shoulder Instructions     Exercise     Balance     End of Session OT - End of Session Equipment Utilized During Treatment: Gait belt Activity Tolerance: Patient tolerated treatment well;Patient limited by pain Patient left: in chair;with call bell/phone within reach Nurse Communication: Mobility status  GO     Floretta Petro 06/13/2012, 3:17 PM

## 2012-06-13 NOTE — Evaluation (Signed)
Physical Therapy Evaluation Patient Details Name: Andrew [name] MRN: [id number] DOB: 09-23-1955 Today's Date: 06/13/2012 Time: 1140-1200 PT Time Calculation (min): 20 min  PT Assessment / Plan / Recommendation Clinical Impression  Pt is 56 y/o male admitted for s/p redo left femoral to peroneal artery bypass graft.  Pt limited due to left LE pain.  Pt will benefit from acute PT services to improve overall mobility and endurance prior to d/c home.  Pt concerned about d/c home due to d/c home alone.  Will continue to assess pts progression to determine best d/c plans.    PT Assessment  Patient needs continued PT services    Follow Up Recommendations  Home health PT;Supervision/Assistance - 24 hour (may need SNF depending on progress with no caregiver)    Does the patient have the potential to tolerate intense rehabilitation      Barriers to Discharge Decreased caregiver support      Equipment Recommendations       Recommendations for Other Services     Frequency Min 3X/week    Precautions / Restrictions Precautions Precautions: Fall (reports fall in shower at home)   Pertinent Vitals/Pain 5/10 left LE pain      Mobility  Bed Mobility Bed Mobility: Supine to Sit Supine to Sit: 5: Supervision Details for Bed Mobility Assistance: Supervision for safety  Transfers Transfers: Sit to Stand;Stand to Sit Sit to Stand: 4: Min guard;From bed Stand to Sit: To chair/3-in-1;4: Min guard Details for Transfer Assistance: Minguard for safety with cues for hand placement Ambulation/Gait Ambulation/Gait Assistance: 4: Min guard Ambulation Distance (Feet): 20 Feet Assistive device: Rolling walker Ambulation/Gait Assistance Details: Minguard for safety with cues for RW placement Gait Pattern: Step-through pattern;Antalgic Stairs: No    Shoulder Instructions     Exercises     PT Diagnosis: Difficulty walking;Abnormality of gait;Generalized weakness;Acute pain  PT Problem List:  Decreased strength;Decreased range of motion;Decreased activity tolerance;Decreased balance;Decreased mobility;Decreased knowledge of use of DME;Pain PT Treatment Interventions: DME instruction;Gait training;Functional mobility training;Stair training;Therapeutic activities;Therapeutic exercise;Balance training;Patient/family education   PT Goals Acute Rehab PT Goals PT Goal Formulation: With patient Time For Goal Achievement: 06/20/12 Potential to Achieve Goals: Good Pt will go Supine/Side to Sit: with modified independence PT Goal: Supine/Side to Sit - Progress: Goal set today Pt will go Sit to Supine/Side: with modified independence PT Goal: Sit to Supine/Side - Progress: Goal set today Pt will go Sit to Stand: with modified independence PT Goal: Sit to Stand - Progress: Goal set today Pt will go Stand to Sit: with modified independence PT Goal: Stand to Sit - Progress: Goal set today Pt will Ambulate: >150 feet;with modified independence;with rolling walker PT Goal: Ambulate - Progress: Goal set today Pt will Go Up / Down Stairs: 3-5 stairs;with modified independence;with rolling walker PT Goal: Up/Down Stairs - Progress: Goal set today  Visit Information  Last PT Received On: 06/13/12 Assistance Needed: +1    Subjective Data  Subjective: "I'm not sure how I'm going to function at home since Medicare is kicking me out so soon." Patient Stated Goal: To go home eventually   Prior Functioning  Home Living Lives With: Alone (Lives on addition to brothers house; no (A) from brother) Available Help at Discharge:  (none) Type of Home: House Home Access: Stairs to enter [employer] of Steps: 3 Entrance Stairs-Rails: None Home Layout: One level Bathroom Shower/Tub: Forensic scientist: Standard Bathroom Accessibility: Yes How Accessible: Accessible via walker Home Adaptive Equipment: Walker - rolling  Prior Function Level of Independence:  Independent with assistive device(s) Able to Take Stairs?: Yes Driving: Yes Vocation: Retired Musician: No difficulties Dominant Hand: Right    Cognition  Overall Cognitive Status:  (difficult to assess- pt slow to process at times ) Arousal/Alertness: Awake/alert Orientation Level: Appears intact for tasks assessed Cognition - Other Comments: Pt slow to process and question if pt's baseline. Pt difficulty answering hx questions at times and tends to need redirection to task.    Extremity/Trunk Assessment Right Upper Extremity Assessment RUE ROM/Strength/Tone: Within functional levels Left Upper Extremity Assessment LUE ROM/Strength/Tone: Within functional levels Right Lower Extremity Assessment RLE ROM/Strength/Tone: Within functional levels Left Lower Extremity Assessment LLE ROM/Strength/Tone: Unable to fully assess;Due to pain   Balance    End of Session PT - End of Session Equipment Utilized During Treatment: Gait belt Activity Tolerance: Patient tolerated treatment well Patient left: in chair;with call bell/phone within reach Nurse Communication: Mobility status  GP     Carrell Palmatier 06/13/2012, 1:10 PM Jake Shark, PT DPT [phone]

## 2012-06-13 NOTE — Progress Notes (Signed)
Pt ambulated 500 ft with rolling walker. Standby assist. Pt tolerated well. Back to chair after ambulation. Dion Saucier

## 2012-06-13 NOTE — Progress Notes (Signed)
Pt report called to RN on 2000, VSS. Pulses dopplered +, will transfer per MD order.

## 2012-06-13 NOTE — Progress Notes (Signed)
VASCULAR LAB PRELIMINARY  ARTERIAL  ABI completed:    RIGHT    LEFT    PRESSURE WAVEFORM  PRESSURE WAVEFORM  BRACHIAL 155 Triphasic  BRACHIAL 139 Triphasic   DP 79 Dampened monophasic  DP  Monophasic   AT   AT Unable to obtain pressures 2/2 pain   PT 85 Monophasic  PT  Monophasic   PER   PER  Monophasic   GREAT TOE  NA GREAT TOE  NA    RIGHT LEFT  ABI 0.55      Joelynn Dust, RVT 06/13/2012, 10:30 AM

## 2012-06-13 NOTE — Progress Notes (Signed)
ANTICOAGULATION CONSULT NOTE - Initial Consult  Pharmacy Consult for Lovenox Indication: VTE prophylaxis  No Known Allergies  Patient Measurements: Height: 5\' 11"  (180.3 cm) Weight: 162 lb 14.7 oz (73.9 kg) IBW/kg (Calculated) : 75.3  Heparin Dosing Weight: n/a  Vital Signs: Temp: 98.9 F (37.2 C) (11/06 0855) Temp src: Oral (11/06 0855) BP: 150/82 mmHg (11/06 0855) Pulse Rate: 93  (11/06 0855)  Labs:  Basename 06/13/12 0420 06/12/12 2113 06/12/12 1602 06/11/12 1426 06/11/12 1208 06/11/12 0627  HGB -- 10.8* 11.7* -- -- --  HCT -- 32.4* 34.4* -- 37.5* --  PLT -- 221 215 -- 227 --  APTT 36 -- -- -- 38* --  LABPROT -- -- -- 13.1 -- --  INR -- -- -- 1.00 -- --  HEPARINUNFRC <0.10* <0.10* -- -- -- --  CREATININE -- 0.64 -- -- 0.81 1.00  CKTOTAL -- -- -- -- -- --  CKMB -- -- -- -- -- --  TROPONINI -- -- -- -- -- --    Estimated Creatinine Clearance: 110.3 ml/min (by C-G formula based on Cr of 0.64).   Medical History: Past Medical History  Diagnosis Date  . Tobacco abuse   . 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 Manson Passey).  Pt will likely require a wide resection and  has been refered to ENT (2/12)  . Dental caries   . Glucose intolerance (impaired glucose tolerance)     Max random CBG 130 (08/29/07) typically about 100. A1C in 11/08 was 5.4.  . Peripheral vascular disease   . Hypertension, essential   . Dyslipidemia   . Anxiety   . Thoracic kyphosis   . Back pain      The patient has chronic lumbar/thoracic back pain, he has kyphosis, mild facet hypertrophy at L1-L2, circumferential annular bulging and mild vertebral body osteophytic formation at L2-L3, mild to moderate stenosis of the medial aspect of the neural foramen at L4-L5, and mild annular bulging at L5-S1.  Marland Kitchen History of alcohol abuse   . Diabetes mellitus   . Hyperlipidemia   . Peripheral arterial disease   . Leg pain   .  Back pain   . Ulcer     Medications:  Scheduled:    . aspirin  81 mg Oral Daily  . [COMPLETED] cefUROXime (ZINACEF)  IV  1.5 g Intravenous Q12H  . docusate sodium  100 mg Oral Daily  . enalapril  10 mg Oral Daily  . enoxaparin (LOVENOX) injection  40 mg Subcutaneous Q24H  . hydrochlorothiazide  25 mg Oral Daily  . [EXPIRED] HYDROmorphone      . [EXPIRED] HYDROmorphone      . metoprolol  50 mg Oral BID  . multivitamin with minerals  1 tablet Oral Daily  . pantoprazole  40 mg Oral Daily  . sertraline  100 mg Oral Daily  . simvastatin  40 mg Oral QHS  . [DISCONTINUED] multivitamin with minerals  1 tablet Oral Daily    Assessment: 56 yo male s/p femoropopliteal peroneal bypass graft.  Previously on IV heparin at 400 units/hr per MD.  Heparin levels were undetectable.  Heparin d/c'd this morning, and pharmacy now asked to dose Lovenox for DVT prophylaxis.  CBC, Pltc appropriate for Lovenox dosing.  Goal of Therapy:  Anti Xa level 0.3-0.6 Monitor platelets by anticoagulation protocol: Yes   Plan:  1. Lovenox 40 mg SQ daily.   Janavia Rottman,  Derrek Puff C 06/13/2012,9:34 AM

## 2012-06-14 ENCOUNTER — Telehealth: Payer: Self-pay | Admitting: Vascular Surgery

## 2012-06-14 MED ORDER — INFLUENZA VIRUS VACC SPLIT PF IM SUSP
0.5000 mL | Freq: Once | INTRAMUSCULAR | Status: AC
  Administered 2012-06-14: 0.5 mL via INTRAMUSCULAR
  Filled 2012-06-14: qty 0.5

## 2012-06-14 NOTE — Progress Notes (Addendum)
[facility] Progress Note  06/14/2012 7:21 AM POD 2  Subjective:  No complaints  Tm 99.2 VSS 98%RA  Filed Vitals:   06/14/12 0535  BP: 122/83  Pulse: 94  Temp: 99.2 F (37.3 C)  Resp: 19    Physical Exam: Incisions:  C/d/i Extremities:  left Foot is warm and well perfused.  CBC    Component Value Date/Time   WBC 11.0* 06/12/2012 2113   RBC 3.64* 06/12/2012 2113   HGB 10.8* 06/12/2012 2113   HCT 32.4* 06/12/2012 2113   PLT 221 06/12/2012 2113   MCV 89.0 06/12/2012 2113   MCH 29.7 06/12/2012 2113   MCHC 33.3 06/12/2012 2113   RDW 13.0 06/12/2012 2113   LYMPHSABS 1.8 02/03/2010 0054   MONOABS 0.5 02/03/2010 0054   EOSABS 0.3 02/03/2010 0054   BASOSABS 0.1 02/03/2010 0054    BMET    Component Value Date/Time   NA 134* 06/12/2012 2113   K 3.8 06/12/2012 2113   CL 99 06/12/2012 2113   CO2 27 06/12/2012 2113   GLUCOSE 139* 06/12/2012 2113   BUN 6 06/12/2012 2113   CREATININE 0.64 06/12/2012 2113   CALCIUM 8.9 06/12/2012 2113   GFRNONAA >90 06/12/2012 2113   GFRAA >90 06/12/2012 2113    INR    Component Value Date/Time   INR 1.00 06/11/2012 1426     Intake/Output Summary (Last 24 hours) at 06/14/12 0721 Last data filed at 06/14/12 0310  Gross per 24 hour  Intake    120 ml  Output   3675 ml  Net  -3555 ml     Assessment/Plan:  56 y.o. male is s/p redo left femoral to peroneal artery bypass graft with composite propaten graft with a segment of left greater saphenous vein and intraoperative arteriogram POD 2  -doing well this am. -foot is warm and well perfused. -discharge home today -f/u with Dr. Edilia Bo in 2 weeks.  Doreatha Massed, PA-C [facility] [phone] 06/14/2012 7:21 AM  Foot hyperemic Incisions fine Home today.  Di Kindle. Edilia Bo, MD, FACS Beeper [phone] 06/14/2012

## 2012-06-14 NOTE — Progress Notes (Signed)
Occupational Therapy Treatment Patient Details Name: Andrew [name] MRN: [id number] DOB: [birth year] Today's Date: 06/14/2012 Time: 1027-2536 OT Time Calculation (min): 30 min  OT Assessment / Plan / Recommendation Comments on Treatment Session Pt doing well overall supervision with use of RW and other DME.  Will need a tub bench and 3:1.  Feel he will be OK at home with intermittent supervision from his son.    Follow Up Recommendations  Home health OT       Equipment Recommendations  3 in 1 bedside comode;Tub/shower bench       Frequency Min 2X/week   Plan Discharge plan remains appropriate    Precautions / Restrictions Precautions Precautions: Fall Restrictions Weight Bearing Restrictions: No   Pertinent Vitals/Pain Pain 6/10 in the left LE    ADL  Grooming: Simulated;Supervision/safety Where Assessed - Grooming: Supported standing Lower Body Dressing: Performed;Set up Where Assessed - Lower Body Dressing: Supported sit to stand Toilet Transfer: Performed;Minimal assistance Toilet Transfer Method: Other (comment) (ambulate with RW to comfort height toilet with rail.) Toilet Transfer Equipment: Comfort height toilet;Grab bars Toileting - Clothing Manipulation and Hygiene: Simulated;Minimal assistance Where Assessed - Engineer, mining and Hygiene: Sit to stand from 3-in-1 or toilet Tub/Shower Transfer: Simulated;Supervision/safety Equipment Used: Rolling walker Transfers/Ambulation Related to ADLs: Pt is overall distant supervision for mobility using the RW for safety. ADL Comments: Discussed use of a 3:1 and tub bench at discharge.  Feel he will need both secondary to having regular height toilet and a tub shower.  Educated on tub/shower transfers as well as use of a walker for kitchen tasks as well.        OT Goals ADL Goals ADL Goal: Grooming - Progress: Progressing toward goals ADL Goal: Lower Body Dressing - Progress: Progressing toward goals ADL  Goal: Toilet Transfer - Progress: Progressing toward goals ADL Goal: Toileting - Clothing Manipulation - Progress: Progressing toward goals ADL Goal: Tub/Shower Transfer - Progress: Progressing toward goals  Visit Information  Last OT Received On: 06/14/12    Subjective Data  Subjective: "My mom had a seat like taht after she lost her leg." Patient Stated Goal: To be independent and not have to have his leg amputated.      Cognition  Overall Cognitive Status: Appears within functional limits for tasks assessed/performed Arousal/Alertness: Awake/alert Orientation Level: Appears intact for tasks assessed Behavior During Session: [facility] for tasks performed    Mobility  Shoulder Instructions Transfers Transfers: Sit to Stand Sit to Stand: 5: Supervision;With upper extremity assist;From chair/3-in-1;With armrests          Balance Balance Balance Assessed: Yes Dynamic Standing Balance Dynamic Standing - Balance Support: Right upper extremity supported;Left upper extremity supported Dynamic Standing - Level of Assistance: 5: Stand by assistance   End of Session OT - End of Session Activity Tolerance: Patient tolerated treatment well Patient left: with call bell/phone within reach;in chair Nurse Communication: Mobility status     Andrew [name] OTR/L Pager number [phone] 06/14/2012, 2:36 PM

## 2012-06-14 NOTE — Progress Notes (Signed)
Pt/family given discharge instructions, medication lists, follow up appointments, and when to call the doctor.  Pt/family verbalizes understanding. Pt given signs and symptoms of infection. Andrew [name]    

## 2012-06-14 NOTE — Care Management Note (Signed)
    Page 1 of 2   06/14/2012     4:21:16 PM   CARE MANAGEMENT NOTE 06/14/2012  Patient:  BHARAT, ANTILLON   Account Number:  [id number]  Date Initiated:  06/14/2012  Documentation initiated by:  Kenisha Lynds  Subjective/Objective Assessment:   PT S/P LT FEMORAL TO PERONEAL ARTERY BYPASS GRAFT ON 06/11/12.  PTA, PT INDEPENDENT, LIVES WITH BROTHER.     Action/Plan:   MET WITH PT TO DISCUSS DC PLANS.  BROTHER TO ASSIST AT HOME.  AGREEABLE TO HH CARE.  NEEDS 3 IN 1, TUB BENCH, BUT HAS RW.   Anticipated DC Date:  06/14/2012   Anticipated DC Plan:  HOME W HOME HEALTH SERVICES      DC Planning Services  CM consult      [facility] Choice  HOME HEALTH   Choice offered to / List presented to:  C-1 Patient   DME arranged  BEDSIDE COMMODE  TUB BENCH      DME agency  [facility]     HH arranged  HH-1 RN  HH-2 PT  HH-3 OT      [facility] agency  [facility]   Status of service:  Completed, signed off Medicare Important Message given?   (If response is "NO", the following Medicare IM given date fields will be blank) Date Medicare IM given:   Date Additional Medicare IM given:    Discharge Disposition:  HOME W HOME HEALTH SERVICES  Per UR Regulation:  Reviewed for med. necessity/level of care/duration of stay  If discussed at Long Length of Stay Meetings, dates discussed:    Comments:  06/14/12 Damonique Brunelle,RN,BSN [phone] REFERRAL TO [facility] FOR [facility] AND DME NEEDS.  DME DELIVERED TO ROOM PRIOR TO DC. START OF CARE FOR HH 24-48H POST DC DATE.

## 2012-06-14 NOTE — Discharge Summary (Signed)
[facility] Discharge Summary  Andrew [name] 01/27/1956 56 y.o. male  [id number]  Admission Date: 06/11/2012  Discharge Date: 06/14/12  Physician: Chuck Hint, MD  Admission Diagnosis: PVD PVD   HPI:   This is a 56 y.o. male who presents for evaluation of pain in his left foot. The patient previously underwent aortobifemoral bypass several years ago. He has also had a previous left femoral to peroneal bypass. He had revision with patch angioplasty of this peroneal bypass by Dr. Edilia Bo approximately one year ago. The patient states his symptoms have been present for several weeks. He states that he has continuous pain in the left foot. He has no ulcerations on the foot. Other medical problems include ongoing tobacco abuse, borderline diabetes, hyperlipidemia, hypertension, chronic back pain. All these are currently stable.  Hospital Course:  The patient was admitted to the hospital and taken to the operating room on 06/12/2012 and underwent redo left femoral to peroneal artery bypass graft with composite propaten graft with a segment of left greater saphenous vein and intraoperative arteriogram .  The pt tolerated the procedure well and was transported to the PACU in good condition. By POD 1, he was doing well with good doppler flow in his pedal pulses.  He was transferred to the telemetry floor that day.  By POD 2, he is doing well with a well perfused left foot.  ABI on 06/13/12:  RIGHT    LEFT     PRESSURE  WAVEFORM   PRESSURE  WAVEFORM   BRACHIAL  155  Triphasic  BRACHIAL  139  Triphasic   DP  79  Dampened monophasic  DP   Monophasic   AT    AT  Unable to obtain pressures 2/2 pain    PT  85  Monophasic  PT   Monophasic   PER    PER   Monophasic   GREAT TOE   NA  GREAT TOE   NA     RIGHT  LEFT   ABI  0.55       The remainder of the hospital course consisted of increasing mobilization and increasing intake of solids without difficulty.  CBC      Component Value Date/Time   WBC 11.0* 06/12/2012 2113   RBC 3.64* 06/12/2012 2113   HGB 10.8* 06/12/2012 2113   HCT 32.4* 06/12/2012 2113   PLT 221 06/12/2012 2113   MCV 89.0 06/12/2012 2113   MCH 29.7 06/12/2012 2113   MCHC 33.3 06/12/2012 2113   RDW 13.0 06/12/2012 2113   LYMPHSABS 1.8 02/03/2010 0054   MONOABS 0.5 02/03/2010 0054   EOSABS 0.3 02/03/2010 0054   BASOSABS 0.1 02/03/2010 0054    BMET    Component Value Date/Time   NA 134* 06/12/2012 2113   K 3.8 06/12/2012 2113   CL 99 06/12/2012 2113   CO2 27 06/12/2012 2113   GLUCOSE 139* 06/12/2012 2113   BUN 6 06/12/2012 2113   CREATININE 0.64 06/12/2012 2113   CALCIUM 8.9 06/12/2012 2113   GFRNONAA >90 06/12/2012 2113   GFRAA >90 06/12/2012 2113     Discharge Instructions:   The patient is discharged to home with extensive instructions on wound care and progressive ambulation.  They are instructed not to drive or perform any heavy lifting until returning to see the physician in his office.  Discharge Orders    Future Appointments: Provider: Department: Dept Phone: Center:   11/14/2012 3:00 PM [facility] [phone] [facility]   11/14/2012 3:30 PM [facility] [phone] [facility]   11/14/2012 4:00 PM Chuck Hint, MD [facility] [phone] [facility]     Future Orders Please Complete By Expires   Resume previous diet      Driving Restrictions      Comments:   No driving for 2 weeks   Lifting restrictions      Comments:   No lifting for 6 weeks   Call MD for:  temperature >100.5      Call MD for:  redness, tenderness, or signs of infection (pain, swelling, bleeding, redness, odor or green/yellow discharge around incision site)      Call MD for:  severe or increased pain, loss or decreased feeling  in affected limb(s)      Discharge wound care:      Comments:   Shower daily with soap and water starting 06/15/12       Discharge Diagnosis:  PVD PVD  Secondary Diagnosis: Patient Active Problem List  Diagnosis  . Malignant neoplasm of skin of lip  . DYSLIPIDEMIA  . ANXIETY  . TOBACCO ABUSE  . HYPERTENSION, ESSENTIAL NOS  . PERIPHERAL VASCULAR DISEASE  . OTHER DENTAL CARIES  . BACK PAIN  . THORACIC KYPHOSIS  . CHEST PAIN  . MECH COMPLICATION DUE CORONARY BYPASS GRAFT  . GLUCOSE INTOLERANCE, HX OF  . OTHER MALIGNANT NEOPLASM OF SKIN OF LIP  . Preventative health care  . Atherosclerosis of native arteries of the extremities with intermittent claudication  . S/P bypass graft of extremity  . Numbness and tingling  . Pain in limb   Past Medical History  Diagnosis Date  . Tobacco abuse   . 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 Manson Passey).  Pt will likely require a wide resection and  has been refered to ENT (2/12)  . Dental caries   . Glucose intolerance (impaired glucose tolerance)     Max random CBG 130 (08/29/07) typically about 100. A1C in 11/08 was 5.4.  . Peripheral vascular disease   . Hypertension, essential   . Dyslipidemia   . Anxiety   . Thoracic kyphosis   . Back pain      The patient has chronic lumbar/thoracic back pain, he has kyphosis, mild facet hypertrophy at L1-L2, circumferential annular bulging and mild vertebral body osteophytic formation at L2-L3, mild to moderate stenosis of the medial aspect of the neural foramen at L4-L5, and mild annular bulging at L5-S1.  Marland Kitchen History of alcohol abuse   . Diabetes mellitus   . Hyperlipidemia   . Peripheral arterial disease   . Leg pain   . Back pain   . Ulcer       Andrew [name]  Home Medication Instructions [id number]:[id number]   Printed on:06/14/12 4132  Medication Information                    Vitamins-Lipotropics (B COMPLEX FORMULA 1 PO) Take 1 tablet by mouth daily.             omeprazole (PRILOSEC) 20 MG capsule Take 1 capsule (20 mg  total) by mouth daily.           simvastatin (ZOCOR) 40 MG tablet Take 1 tablet (40 mg total) by mouth at bedtime.  Multiple Vitamins-Minerals (MULTIVITAMIN WITH MINERALS) tablet Take 1 tablet by mouth daily.           aspirin 81 MG chewable tablet Chew 81 mg by mouth daily.           clonazePAM (KLONOPIN) 1 MG tablet Take 1 mg by mouth 3 (three) times daily as needed. Anxiety           traMADol (ULTRAM) 50 MG tablet Take 100 mg by mouth every 6 (six) hours as needed. Pain           enalapril (VASOTEC) 10 MG tablet Take 10 mg by mouth daily.           traZODone (DESYREL) 50 MG tablet Take 100 mg by mouth at bedtime as needed. Sleep           hydrochlorothiazide (HYDRODIURIL) 25 MG tablet Take 25 mg by mouth daily.           metoprolol (LOPRESSOR) 50 MG tablet Take 50 mg by mouth 2 (two) times daily.           sertraline (ZOLOFT) 100 MG tablet Take 100 mg by mouth daily.           oxyCODONE-acetaminophen (PERCOCET) 10-325 MG per tablet Take 1 tablet by mouth every 4 (four) hours as needed for pain. #30 NR            Disposition: home  Patient's condition: is Good  Follow up: 1. Dr. Edilia Bo in 2 weeks   Doreatha Massed, PA-C [facility] [phone] 06/14/2012  7:29 AM

## 2012-06-14 NOTE — Discharge Summary (Signed)
Agree with plans for D/C today.  Di Kindle. Edilia Bo, MD, FACS Beeper [phone] 06/14/2012

## 2012-06-14 NOTE — Telephone Encounter (Signed)
Message copied by Margaretmary Eddy on Thu Jun 14, 2012 11:25 AM ------      Message from: Sharee Pimple      Created: Tue Jun 12, 2012  1:00 PM      Regarding: schedule                   ----- Message -----         From: Dara Lords, PA         Sent: 06/12/2012  11:57 AM           To: Sharee Pimple, CMA            S/p left redo fem-peroneal bypass graft by [facility] 06/12/12.      F/u with him in 2 weeks.            Thanks,      Lelon Mast

## 2012-06-15 ENCOUNTER — Encounter: Payer: Self-pay | Admitting: Vascular Surgery

## 2012-06-19 ENCOUNTER — Telehealth: Payer: Self-pay | Admitting: *Deleted

## 2012-06-19 NOTE — Telephone Encounter (Signed)
Patient called requesting refill on pain medication oxycodone. He is s/p aortogram 06/11/12. Explained to patient for that procedure we could not refill that medication and he then stated he already had vicodin from another physician. He said he would use the vicodin.

## 2012-06-27 ENCOUNTER — Ambulatory Visit: Payer: Medicare Other | Admitting: Vascular Surgery

## 2012-07-11 ENCOUNTER — Other Ambulatory Visit: Payer: Self-pay | Admitting: *Deleted

## 2012-07-11 NOTE — Telephone Encounter (Signed)
Please review recent notes, pt states he has run out early plus he has had other meds prescribed recently, states his foot and leg hurt from walking.

## 2012-07-12 MED ORDER — CLONAZEPAM 1 MG PO TABS: 1.0000 mg | ORAL_TABLET | Freq: Three times a day (TID) | ORAL | Status: DC | PRN

## 2012-07-12 MED ORDER — HYDROCODONE-ACETAMINOPHEN 7.5-500 MG PO TABS: ORAL_TABLET | ORAL | Status: DC

## 2012-07-17 ENCOUNTER — Encounter: Payer: Self-pay | Admitting: Vascular Surgery

## 2012-07-18 ENCOUNTER — Encounter: Payer: Self-pay | Admitting: Vascular Surgery

## 2012-07-18 ENCOUNTER — Ambulatory Visit (INDEPENDENT_AMBULATORY_CARE_PROVIDER_SITE_OTHER): Payer: Medicare Other | Admitting: Vascular Surgery

## 2012-07-18 VITALS — BP 125/82 | HR 100 | Ht 71.0 in | Wt 163.6 lb

## 2012-07-18 DIAGNOSIS — Z48812 Encounter for surgical aftercare following surgery on the circulatory system: Secondary | ICD-10-CM

## 2012-07-18 DIAGNOSIS — I739 Peripheral vascular disease, unspecified: Secondary | ICD-10-CM

## 2012-07-18 NOTE — Addendum Note (Signed)
Addended by: Sharee Pimple on: 07/18/2012 03:37 PM   Modules accepted: Orders

## 2012-07-18 NOTE — Progress Notes (Signed)
[facility] [place]  Patient name: Andrew [name] MRN: [id number] DOB: 09/10/1955 Sex: male  REASON FOR VISIT: follow up after re\re to left fem-peroneal bypass  HPI: Andrew [name] is a 56 y.o. male who had undergone a previous aortobifemoral bypass graft and a left femoral to peroneal artery bypass graft by Dr. Liliane Bade in 2003. He presented with an occluded graft which had occluded 3 weeks prior. He continues to use tobacco. He underwent a redo left femoral to peroneal artery bypass graft with a composite PTFE greater saphenous vein graft on 06/12/2012. He missed his original follow up visit in comes in today for follow up. For this reason a Doppler study was not done. He's had no significant claudication or rest pain in the left leg. His wounds have not been problematic for him. He denies fever or chills.  He is on aspirin. He is on a beta blocker. He is on a statin.  REVIEW OF SYSTEMS: Arly.Keller ] denotes positive finding; [  ] denotes negative finding  CARDIOVASCULAR:  [ ]  chest pain   [ ]  dyspnea on exertion    CONSTITUTIONAL:  [ ]  fever   [ ]  chills  PHYSICAL EXAM: Filed Vitals:   07/18/12 1408  BP: 125/82  Pulse: 100  Height: 5\' 11"  (1.803 m)  Weight: 163 lb 9.6 oz (74.208 kg)  SpO2: 100%   Body mass index is 22.82 kg/(m^2). GENERAL: The patient is a well-nourished male, in no acute distress. The vital signs are documented above. CARDIOVASCULAR: There is a regular rate and rhythm  PULMONARY: There is good air exchange bilaterally without wheezing or rales. He has palpable femoral pulses. All of his incisions are healing nicely. Both feet are warm and well-perfused.  MEDICAL ISSUES: Patient is doing well status post redo left femoropopliteal peroneal artery bypass graft. He does continue to smoke. I have discussed with the patient the nature of atherosclerosis, and emphasized the importance of maximal medical management including control of blood pressure,  blood glucose, and cholesterol levels, antiplatelet agents, obtaining regular exercise, and cessation of smoking. The patient is aware that without maximal medical management the underlying atherosclerotic disease process will progress and also limit the benefit of any interventions. I have ordered an arterial duplex of his graft and ABIs in 6 months and we will see him back at that time. He knows to call sooner if he has problems.  Jadriel Saxer S [facility] [place] Beeper: [phone]

## 2012-09-03 ENCOUNTER — Encounter: Payer: Self-pay | Admitting: Internal Medicine

## 2012-09-03 ENCOUNTER — Ambulatory Visit (INDEPENDENT_AMBULATORY_CARE_PROVIDER_SITE_OTHER): Payer: Medicare Other | Admitting: Internal Medicine

## 2012-09-03 VITALS — BP 118/86 | HR 88 | Temp 97.5°F | Ht 71.0 in | Wt 163.0 lb

## 2012-09-03 DIAGNOSIS — F17200 Nicotine dependence, unspecified, uncomplicated: Secondary | ICD-10-CM

## 2012-09-03 DIAGNOSIS — K0500 Acute gingivitis, plaque induced: Secondary | ICD-10-CM | POA: Insufficient documentation

## 2012-09-03 MED ORDER — AMOXICILLIN-POT CLAVULANATE 875-125 MG PO TABS: 1.0000 | ORAL_TABLET | Freq: Two times a day (BID) | ORAL | Status: AC

## 2012-09-03 MED ORDER — AMOXICILLIN-POT CLAVULANATE 875-125 MG PO TABS: 1.0000 | ORAL_TABLET | Freq: Two times a day (BID) | ORAL | Status: DC

## 2012-09-03 MED ORDER — HYDROCODONE-ACETAMINOPHEN 7.5-500 MG PO TABS: ORAL_TABLET | ORAL | Status: DC

## 2012-09-03 NOTE — Assessment & Plan Note (Signed)
He states that he is thinking about quitting smoking as he has severe PVD, and both his parents died from diseases related to smoking. He has quit before and would like to try again. I referred him to 1-800-QUIT-NOW and gave him more reading material about quitting smoking. I advised him to further discuss his progress with his PCP. He agreed and understood.

## 2012-09-03 NOTE — Progress Notes (Signed)
  Subjective:    Patient ID: Andrew [name], male    DOB: 1955/10/28, 57 y.o.   MRN: [id number]  HPI Andrew [name] is a 57 year-old man with PMH of thoracic kyphosis, tobacco abuse, PVD, and dental caries who comes in to the [facility] today for evaluation of pain in his upper gum for the past 2-3 weeks. Because of the pain he has had decreased food intake. His teeth also hurt and he notes that his dentis had told him in the past that he might need total upper teeth extraction. He states that he has had similar problem before, in the past 3 months or so and took some antibiotics from his friends in two occasions with some relief of his symptoms. He is on Lortab for chronic back pain secondary to his thoracic kyphosis with last refill on 08/10/12 but he has run out of this medication now as he took extra pills this month to help reduce with gum and dental pain.   He had no other complaints today.  Review of Systems  Constitutional: Negative for fever, chills and appetite change.  HENT: Positive for dental problem. Negative for facial swelling, mouth sores and sinus pressure.   Neurological: Negative for dizziness and light-headedness.       Objective:   Physical Exam  Nursing note and vitals reviewed. Constitutional: He is oriented to person, place, and time. He appears well-developed and well-nourished. No distress.  HENT:  Mouth/Throat: Oropharynx is clear and moist.       Poor dentition, missing several molars. Diffuse caries. Gingivitis with mild swelling of the gums and erythema with tenderness to light percussion throughout upper gum. Lower gum with no apparent gingivitis. No abscess noted.   Eyes: Conjunctivae normal are normal. No scleral icterus.  Neck: Neck supple.  Cardiovascular: Normal rate.   Pulmonary/Chest: Effort normal and breath sounds normal.  Musculoskeletal:       Moderate to severe thoracic kyphosis  Neurological: He is alert and oriented to person, place, and time.  Skin: Skin  is dry. He is not diaphoretic.  Psychiatric: He has a normal mood and affect.          Assessment & Plan:

## 2012-09-03 NOTE — Patient Instructions (Signed)
-  Take all your antibiotics as prescribed until you finish it all.   -You need to stop smoking in order to improve your dental problems and for your general health. Please call 1800-QUIT-NOW for more information on how to quit smoking.  You Can Quit Smoking If you are ready to quit smoking or are thinking about it, congratulations! You have chosen to help yourself be healthier and live longer! There are lots of different ways to quit smoking. Nicotine gum, nicotine patches, a nicotine inhaler, or nicotine nasal spray can help with physical craving. Hypnosis, support groups, and medicines help break the habit of smoking. TIPS TO GET OFF AND STAY OFF CIGARETTES  Learn to predict your moods. Do not let a bad situation be your excuse to have a cigarette. Some situations in your life might tempt you to have a cigarette.  Ask friends and co-workers not to smoke around you.  Make your home smoke-free.  Never have "just one" cigarette. It leads to wanting another and another. Remind yourself of your decision to quit.  On a card, make a list of your reasons for not smoking. Read it at least the same number of times a day as you have a cigarette. Tell yourself everyday, "I do not want to smoke. I choose not to smoke."  Ask someone at home or work to help you with your plan to quit smoking.  Have something planned after you eat or have a cup of coffee. Take a walk or get other exercise to perk you up. This will help to keep you from overeating.  Try a relaxation exercise to calm you down and decrease your stress. Remember, you may be tense and nervous the first two weeks after you quit. This will pass.  Find new activities to keep your hands busy. Play with a pen, coin, or rubber band. Doodle or draw things on paper.  Brush your teeth right after eating. This will help cut down the craving for the taste of tobacco after meals. You can try mouthwash too.  Try gum, breath mints, or diet candy to keep  something in your mouth. IF YOU SMOKE AND WANT TO QUIT:  Do not stock up on cigarettes. Never buy a carton. Wait until one pack is finished before you buy another.  Never carry cigarettes with you at work or at home.  Keep cigarettes as far away from you as possible. Leave them with someone else.  Never carry matches or a lighter with you.  Ask yourself, "Do I need this cigarette or is this just a reflex?"  Bet with someone that you can quit. Put cigarette money in a piggy bank every morning. If you smoke, you give up the money. If you do not smoke, by the end of the week, you keep the money.  Keep trying. It takes 21 days to change a habit!  Talk to your doctor about using medicines to help you quit. These include nicotine replacement gum, lozenges, or skin patches. Document Released: 05/21/2009 Document Revised: 10/17/2011 Document Reviewed: 05/21/2009 [facility] Patient Information 2013 [place], [place].

## 2012-09-03 NOTE — Assessment & Plan Note (Addendum)
He has gingivitis of upper gum on physical exam and reports pain, swelling, and hypersensitivity of his gums for weeks now. He needs a referral to his dentist. He has taken extra Lortab tablets for this current pain and now is out of this medication. He is a current everyday smoker and was advised to quit smoking in order to improve his dental health and his overall health, including his PVD; he agreed.  -Ambulatory referral to dentist -Refilled Lortab 7.5-500 1-2 tablets q6h PRN for pain -Augmentin 875-125mg  q12hour for 7 days.  -Gave him the 1-800-QUIT-NOW number and additional information in regards to quitting smoking.

## 2012-09-06 ENCOUNTER — Telehealth: Payer: Self-pay | Admitting: *Deleted

## 2012-09-06 DIAGNOSIS — I739 Peripheral vascular disease, unspecified: Secondary | ICD-10-CM

## 2012-09-06 NOTE — Telephone Encounter (Signed)
Call from pt stating Dr Garald Braver gave him the wrong pain med 1/27. He was given Hydrocodone 7.5/500mg ; requesting Norco 10/325mg  which he has been taking. And he will be out of medication by Saturday. Thanks

## 2012-09-07 ENCOUNTER — Other Ambulatory Visit: Payer: Self-pay | Admitting: Internal Medicine

## 2012-09-07 MED ORDER — HYDROCODONE-ACETAMINOPHEN 10-325 MG PO TABS: 1.0000 | ORAL_TABLET | ORAL | Status: DC | PRN

## 2012-09-07 NOTE — Telephone Encounter (Signed)
Norco 10/325mg  rx called to [facility].

## 2012-09-07 NOTE — Telephone Encounter (Signed)
Order written for hydrocodone-acetaminophen 10-325.  Please phone in to pharmacy.

## 2012-09-10 ENCOUNTER — Other Ambulatory Visit: Payer: Self-pay | Admitting: *Deleted

## 2012-09-11 NOTE — Telephone Encounter (Signed)
Texted dr brown, pt filled 1/27 script and has taken all of them

## 2012-09-24 ENCOUNTER — Other Ambulatory Visit: Payer: Self-pay | Admitting: Internal Medicine

## 2012-09-26 ENCOUNTER — Other Ambulatory Visit: Payer: Self-pay | Admitting: *Deleted

## 2012-09-26 MED ORDER — HYDROCHLOROTHIAZIDE 25 MG PO TABS: 25.0000 mg | ORAL_TABLET | Freq: Every day | ORAL | Status: DC

## 2012-09-26 MED ORDER — SERTRALINE HCL 100 MG PO TABS: 100.0000 mg | ORAL_TABLET | Freq: Every day | ORAL | Status: DC

## 2012-09-26 MED ORDER — OMEPRAZOLE 20 MG PO CPDR: 20.0000 mg | DELAYED_RELEASE_CAPSULE | Freq: Every day | ORAL | Status: DC

## 2012-10-05 ENCOUNTER — Telehealth: Payer: Self-pay | Admitting: *Deleted

## 2012-10-05 NOTE — Telephone Encounter (Signed)
Dr Rayfield Citizen office nurse called, Andrew [name] came to see them for tooth pain and is scheduled 3/18 for some oral surg, he had requested from them some pain med, he was ask if he gets pain medicine somewhere else and told them yes but he was not due until the first week of march, dr Jeanice Lim was hesitant to prescribe due to the fact pt was ask if he has pain contract in clinic and he stated yes, also in 2010 pt came to dr Deirdre Peer office was scheduled for surg, given pain meds and never came for surg.  i called the pharmacy, pt last filled hydrocodone 2/3- pharmacist states that if one follows directions "to a T" this is only a 25 day supply, i informed him that the clinic sees it as a 30 day supply but that he could fill it tomorrow  Due to the dental pain but hereafter it was to be considered a 30 day supply. Are you agreeable? If not tell me how you would like it changed and i will do so. Thanks, h.

## 2012-10-05 NOTE — Telephone Encounter (Signed)
I agree, it seems fair to allow an early refill this one time due to acute tooth pain.  Hereafter these should be 30-day prescriptions.  Thank you.

## 2012-10-17 ENCOUNTER — Ambulatory Visit (INDEPENDENT_AMBULATORY_CARE_PROVIDER_SITE_OTHER): Payer: Medicare Other | Admitting: Internal Medicine

## 2012-10-17 ENCOUNTER — Encounter: Payer: Self-pay | Admitting: Internal Medicine

## 2012-10-17 VITALS — BP 152/90 | HR 84 | Temp 97.9°F | Ht 71.0 in | Wt 161.2 lb

## 2012-10-17 DIAGNOSIS — E785 Hyperlipidemia, unspecified: Secondary | ICD-10-CM

## 2012-10-17 DIAGNOSIS — K0500 Acute gingivitis, plaque induced: Secondary | ICD-10-CM

## 2012-10-17 DIAGNOSIS — I10 Essential (primary) hypertension: Secondary | ICD-10-CM

## 2012-10-17 DIAGNOSIS — I739 Peripheral vascular disease, unspecified: Secondary | ICD-10-CM

## 2012-10-17 DIAGNOSIS — F17200 Nicotine dependence, unspecified, uncomplicated: Secondary | ICD-10-CM

## 2012-10-17 DIAGNOSIS — Z8639 Personal history of other endocrine, nutritional and metabolic disease: Secondary | ICD-10-CM

## 2012-10-17 LAB — LIPID PANEL
Cholesterol: 132 mg/dL (ref 0–200)
HDL: 42 mg/dL (ref >39)
Total CHOL/HDL Ratio: 3.1 Ratio
Triglycerides: 196 mg/dL — ABNORMAL HIGH (ref <150)
VLDL: 39 mg/dL (ref 0–40)

## 2012-10-17 MED ORDER — HYDROCODONE-ACETAMINOPHEN 5-325 MG PO TABS: 1.0000 | ORAL_TABLET | Freq: Four times a day (QID) | ORAL | Status: DC | PRN

## 2012-10-17 MED ORDER — HYDROCODONE-ACETAMINOPHEN 10-325 MG PO TABS: 1.0000 | ORAL_TABLET | Freq: Four times a day (QID) | ORAL | Status: DC | PRN

## 2012-10-17 MED ORDER — ENALAPRIL MALEATE 20 MG PO TABS: 20.0000 mg | ORAL_TABLET | Freq: Every day | ORAL | Status: DC

## 2012-10-17 MED ORDER — VARENICLINE TARTRATE 1 MG PO TABS: 1.0000 mg | ORAL_TABLET | Freq: Two times a day (BID) | ORAL | Status: DC

## 2012-10-17 NOTE — Progress Notes (Signed)
HPI The patient is a 57 y.o. male with a history of PVD, HTN, HL, chronic back pain, presenting for a follow-up visit.  The patient is still smoking about 1/2 to 1 pack/day.  He has been smoking since age 54, reportedly.  He quit smoking for a period of 5 years, "cold [place]" around the time of being diagnosed with an aortic aneurysm (about 10 years ago), but restarted at a New Year's Eve party.  The patient is interested in quitting, and is curious about Chantix.  He has never tried Chantix or nicotine replacement products.  His first cigarette of the day is within the first 5 minutes of awakening in the morning.  The patient is not interested in speaking with our smoking cessation counselor.  The patient is scheduled for dental surgery 3/18 for tooth pain.  The patient has been running out of his pain medications recently due to the tooth.  He asks for a few extra pain pills perioperatively to help him through the acute pain.  The patient's BP is elevated today.  He notes compliance with his BP pills.  ROS: General: no fevers, chills, changes in weight, changes in appetite Skin: no rash HEENT: no blurry vision, hearing changes, sore throat Pulm: no dyspnea, coughing, wheezing CV: no chest pain, palpitations, shortness of breath Abd: no abdominal pain, nausea/vomiting, diarrhea/constipation GU: no dysuria, hematuria, polyuria Ext: no arthralgias, myalgias Neuro: no weakness, numbness, or tingling  Filed Vitals:   10/17/12 1425  BP: 152/90  Pulse: 84    PEX General: alert, cooperative, and in no apparent distress HEENT: pupils equal round and reactive to light, vision grossly intact, oropharynx clear and non-erythematous  Neck: supple, no lymphadenopathy Lungs: clear to ascultation bilaterally, normal work of respiration, no wheezes, rales, ronchi Heart: regular rate and rhythm, no murmurs, gallops, or rubs Abdomen: soft, non-tender, non-distended, normal bowel sounds Extremities: no  cyanosis, clubbing, or edema Neurologic: alert & oriented X3, cranial nerves II-XII intact, strength grossly intact, sensation intact to light touch  Current Outpatient Prescriptions on File Prior to Visit  Medication Sig Dispense Refill  . aspirin 81 MG chewable tablet Chew 81 mg by mouth daily.      . clonazePAM (KLONOPIN) 1 MG tablet Take 1 tablet (1 mg total) by mouth 3 (three) times daily as needed. Anxiety  30 tablet  3  . enalapril (VASOTEC) 10 MG tablet TAKE 1 TABLET BY MOUTH DAILY.  30 tablet  10  . hydrochlorothiazide (HYDRODIURIL) 25 MG tablet Take 1 tablet (25 mg total) by mouth daily.  30 tablet  11  . HYDROcodone-acetaminophen (NORCO) 10-325 MG per tablet Take 1 tablet by mouth every 4 (four) hours as needed.  150 tablet  5  . metoprolol (LOPRESSOR) 50 MG tablet TAKE 1 TABLET BY MOUTH 2 TIMES DAILY.  60 tablet  10  . Multiple Vitamins-Minerals (MULTIVITAMIN WITH MINERALS) tablet Take 1 tablet by mouth daily.      Marland Kitchen omeprazole (PRILOSEC) 20 MG capsule Take 1 capsule (20 mg total) by mouth daily.  30 capsule  11  . oxyCODONE-acetaminophen (PERCOCET) 10-325 MG per tablet Take 1 tablet by mouth every 4 (four) hours as needed for pain.  30 tablet  0  . sertraline (ZOLOFT) 100 MG tablet Take 1 tablet (100 mg total) by mouth daily.  30 tablet  11  . simvastatin (ZOCOR) 40 MG tablet Take 1 tablet (40 mg total) by mouth at bedtime.  30 tablet  11  . traMADol (ULTRAM) 50  MG tablet TAKE 1 TABLET BY MOUTH EVERY 6 HOURS AS NEEDED FOR PAIN.  120 tablet  5  . traZODone (DESYREL) 50 MG tablet Take 100 mg by mouth at bedtime as needed. Sleep      . Vitamins-Lipotropics (B COMPLEX FORMULA 1 PO) Take 1 tablet by mouth daily.         No current facility-administered medications on file prior to visit.    Assessment/Plan

## 2012-10-17 NOTE — Assessment & Plan Note (Signed)
BP Readings from Last 3 Encounters:  10/17/12 152/90  09/03/12 118/86  07/18/12 125/82    Lab Results  Component Value Date   NA 134* 06/12/2012   K 3.8 06/12/2012   CREATININE 0.64 06/12/2012    Assessment:  Blood pressure control: moderately elevated  Progress toward BP goal:  deteriorated  Comments: BP elevated today, despite reported compliance.  Chart review reveals that BP has been fluctuant.  Plan:  Medications:  continue current medications, but increase Enalapril dose from 10 to 20 mg/day  Educational resources provided:    Self management tools provided:    Other plans: Check BMET at next visit in 4 weeks

## 2012-10-17 NOTE — Patient Instructions (Signed)
General Instructions: Your blood pressure is elevated today.  We are increasing your Enalapril medication from 10 mg/day to 20 mg/day.  I've sent a new prescription to your pharmacy.  To help you quit smoking, we are prescribing Chantix: 1. For days 1-3, take only 1/2 of a tablet per day 2. For days 4-7, take only 1/2 of a tablet twice per day 3. Starting on day 8, take 1 tablet twice per day, and continue to do this for 3 months  We are giving you an extra 20 tablets of hydrocodone-acetaminophen, to help you with your tooth pain.  This is a 1-time only extra refill.  Please return for a follow-up visit in 1 month, since we are changing your blood pressure medication today and will need to check some lab work.   Treatment Goals:  Goals (1 Years of Data) as of 10/17/12   None      Progress Toward Treatment Goals:  Treatment Goal 10/17/2012  Blood pressure deteriorated  Stop smoking smoking the same amount    Self Care Goals & Plans:  Self Care Goal 09/03/2012  Manage my medications take my medicines as prescribed; bring my medications to every visit; refill my medications on time; follow the sick day instructions if I am sick  Eat healthy foods eat more vegetables; eat fruit for snacks and desserts; eat foods that are low in salt; eat smaller portions; drink diet soda or water instead of juice or soda  Be physically active take a walk every day; find an activity I enjoy       Care Management & Community Referrals:  Referral 10/17/2012  Referrals made for care management support none needed

## 2012-10-17 NOTE — Assessment & Plan Note (Signed)
The patient is interested in smoking cessation, and wants to try Chantix.  He has not tried any other products in the past.  We discussed some of the side effects of chantix, including vivid dreams and changes in mood.  The patient is instructed to stop this medication if he experiences any concerning symptoms, and to call our office for further instruction.  If the patient fails chantix, we can consider trying NRP's. -start Chantix: 0.5 mg/day for 3 days, then 0.5 mg BID for 4 days, then 1 mg BID for 3 months -will assess patient's ability to quit smoking at our next visit

## 2012-10-17 NOTE — Assessment & Plan Note (Signed)
The patient is scheduled for dental surgery 10/23/12. -I authorize an additional 20 tablets of hydrocodone-acetaminophen 10-325 mg for acute pain.  Prescription given to patient at today's office visit.

## 2012-10-17 NOTE — Assessment & Plan Note (Signed)
Will check lipid panel and A1C for risk factor management, for the treatment of PVD.  BP medications adjusted per above.  Appreciate excellent care by [facility].

## 2012-10-18 LAB — HEMOGLOBIN A1C: Hgb A1c MFr Bld: 5.8 % — ABNORMAL HIGH (ref <5.7)

## 2012-10-22 ENCOUNTER — Telehealth: Payer: Self-pay | Admitting: *Deleted

## 2012-10-22 NOTE — Telephone Encounter (Signed)
Pt calls and states you were going to give him 20 10/325 percocet? i see nothing in your note for this, please advise

## 2012-10-23 NOTE — Telephone Encounter (Signed)
That is correct; the patient has an upcoming dental procedure, so I agreed to give him an additional 20 tablets of hydrocodone-acetaminophen 10-325 tablets.  I printed and signed the prescription, and personally handed it to the patient at our last office visit 10/17/12 (you can view this prescription listed under the "medications" section in the patient's chart).  Per narcotics database search today (3/18), the patient has not yet filled this prescription.  Please advise the patient to use the printed prescription given to him to obtain this medication.  If he has lost the paper prescription, it is ok to phone this in to the pharmacy, if you also check with the pharmacy to ensure it has not been filled in the last week.    FYI, this prescription is in addition to the patient's regular monthly hydrocodone-acetaminophen prescription, for which he has a pain contract with our clinic, and should not disrupt his ability to pick up his stable monthly prescription of this medication.  Thank you.

## 2012-10-31 ENCOUNTER — Other Ambulatory Visit: Payer: Self-pay | Admitting: Internal Medicine

## 2012-11-05 NOTE — Telephone Encounter (Signed)
Called to pharm 

## 2012-11-09 ENCOUNTER — Inpatient Hospital Stay (HOSPITAL_COMMUNITY)
Admission: EM | Admit: 2012-11-09 | Discharge: 2012-11-13 | DRG: 253 | Disposition: A | Discharge status: Home / Self Care | Payer: Medicare Other | Attending: Vascular Surgery | Admitting: Vascular Surgery

## 2012-11-09 ENCOUNTER — Encounter (HOSPITAL_COMMUNITY): Payer: Self-pay | Admitting: Emergency Medicine

## 2012-11-09 DIAGNOSIS — Y832 Surgical operation with anastomosis, bypass or graft as the cause of abnormal reaction of the patient, or of later complication, without mention of misadventure at the time of the procedure: Secondary | ICD-10-CM | POA: Diagnosis present

## 2012-11-09 DIAGNOSIS — F1010 Alcohol abuse, uncomplicated: Secondary | ICD-10-CM | POA: Diagnosis present

## 2012-11-09 DIAGNOSIS — Z79899 Other long term (current) drug therapy: Secondary | ICD-10-CM

## 2012-11-09 DIAGNOSIS — M79609 Pain in unspecified limb: Secondary | ICD-10-CM

## 2012-11-09 DIAGNOSIS — L02419 Cutaneous abscess of limb, unspecified: Secondary | ICD-10-CM | POA: Diagnosis present

## 2012-11-09 DIAGNOSIS — L03116 Cellulitis of left lower limb: Secondary | ICD-10-CM

## 2012-11-09 DIAGNOSIS — E119 Type 2 diabetes mellitus without complications: Secondary | ICD-10-CM | POA: Diagnosis present

## 2012-11-09 DIAGNOSIS — C002 Malignant neoplasm of external lip, unspecified: Secondary | ICD-10-CM | POA: Diagnosis present

## 2012-11-09 DIAGNOSIS — I10 Essential (primary) hypertension: Secondary | ICD-10-CM | POA: Diagnosis present

## 2012-11-09 DIAGNOSIS — Z7982 Long term (current) use of aspirin: Secondary | ICD-10-CM

## 2012-11-09 DIAGNOSIS — I739 Peripheral vascular disease, unspecified: Secondary | ICD-10-CM

## 2012-11-09 DIAGNOSIS — T82898A Other specified complication of vascular prosthetic devices, implants and grafts, initial encounter: Principal | ICD-10-CM | POA: Diagnosis present

## 2012-11-09 DIAGNOSIS — F411 Generalized anxiety disorder: Secondary | ICD-10-CM | POA: Diagnosis present

## 2012-11-09 DIAGNOSIS — E785 Hyperlipidemia, unspecified: Secondary | ICD-10-CM | POA: Diagnosis present

## 2012-11-09 DIAGNOSIS — I743 Embolism and thrombosis of arteries of the lower extremities: Secondary | ICD-10-CM | POA: Diagnosis present

## 2012-11-09 DIAGNOSIS — L03119 Cellulitis of unspecified part of limb: Secondary | ICD-10-CM | POA: Diagnosis present

## 2012-11-09 DIAGNOSIS — F17200 Nicotine dependence, unspecified, uncomplicated: Secondary | ICD-10-CM

## 2012-11-09 DIAGNOSIS — I998 Other disorder of circulatory system: Secondary | ICD-10-CM

## 2012-11-09 LAB — BASIC METABOLIC PANEL
BUN: 9 mg/dL (ref 6–23)
Calcium: 9.7 mg/dL (ref 8.4–10.5)
Chloride: 99 mEq/L (ref 96–112)
Creatinine, Ser: 0.72 mg/dL (ref 0.50–1.35)
GFR calc Af Amer: >90 mL/min (ref >90)
GFR calc non Af Amer: >90 mL/min (ref >90)

## 2012-11-09 LAB — ABO/RH: ABO/RH(D): O POS

## 2012-11-09 LAB — CBC WITH DIFFERENTIAL/PLATELET
Basophils Relative: 0 % (ref 0–1)
Eosinophils Absolute: 0.2 10*3/uL (ref 0.0–0.7)
Eosinophils Relative: 2 % (ref 0–5)
HCT: 40.1 % (ref 39.0–52.0)
Hemoglobin: 13.8 g/dL (ref 13.0–17.0)
MCH: 29.6 pg (ref 26.0–34.0)
MCHC: 34.4 g/dL (ref 30.0–36.0)
MCV: 85.9 fL (ref 78.0–100.0)
Monocytes Absolute: 0.8 10*3/uL (ref 0.1–1.0)
Monocytes Relative: 7 % (ref 3–12)
RDW: 14.1 % (ref 11.5–15.5)

## 2012-11-09 LAB — TYPE AND SCREEN: ABO/RH(D): O POS

## 2012-11-09 MED ORDER — SERTRALINE HCL 100 MG PO TABS
100.0000 mg | ORAL_TABLET | Freq: Every day | ORAL | Status: DC
  Administered 2012-11-09 – 2012-11-13 (×5): 100 mg via ORAL
  Filled 2012-11-09 (×2): qty 1
  Filled 2012-11-09: qty 2
  Filled 2012-11-09 (×2): qty 1

## 2012-11-09 MED ORDER — VARENICLINE TARTRATE 1 MG PO TABS
1.0000 mg | ORAL_TABLET | Freq: Two times a day (BID) | ORAL | Status: DC
  Administered 2012-11-09 – 2012-11-13 (×8): 1 mg via ORAL
  Filled 2012-11-09 (×9): qty 1

## 2012-11-09 MED ORDER — TRAMADOL HCL 50 MG PO TABS
50.0000 mg | ORAL_TABLET | Freq: Four times a day (QID) | ORAL | Status: DC | PRN
  Administered 2012-11-10 – 2012-11-11 (×2): 50 mg via ORAL
  Filled 2012-11-09 (×2): qty 1

## 2012-11-09 MED ORDER — VARENICLINE TARTRATE 1 MG PO TABS
1.0000 mg | ORAL_TABLET | Freq: Two times a day (BID) | ORAL | Status: DC
Start: 2012-11-09 — End: 2012-11-09

## 2012-11-09 MED ORDER — SIMVASTATIN 40 MG PO TABS
40.0000 mg | ORAL_TABLET | Freq: Every day | ORAL | Status: DC
  Administered 2012-11-09 – 2012-11-12 (×4): 40 mg via ORAL
  Filled 2012-11-09 (×5): qty 1

## 2012-11-09 MED ORDER — HYDROMORPHONE HCL PF 1 MG/ML IJ SOLN
1.0000 mg | Freq: Once | INTRAMUSCULAR | Status: AC
  Administered 2012-11-09: 1 mg via INTRAVENOUS
  Filled 2012-11-09: qty 1

## 2012-11-09 MED ORDER — PANTOPRAZOLE SODIUM 40 MG PO TBEC: 40.0000 mg | DELAYED_RELEASE_TABLET | Freq: Every day | ORAL | Status: DC

## 2012-11-09 MED ORDER — METOPROLOL TARTRATE 50 MG PO TABS
50.0000 mg | ORAL_TABLET | Freq: Two times a day (BID) | ORAL | Status: DC
  Administered 2012-11-09 – 2012-11-13 (×8): 50 mg via ORAL
  Filled 2012-11-09 (×6): qty 1
  Filled 2012-11-09: qty 2
  Filled 2012-11-09 (×2): qty 1

## 2012-11-09 MED ORDER — HYDROCODONE-ACETAMINOPHEN 10-325 MG PO TABS
1.0000 | ORAL_TABLET | ORAL | Status: DC | PRN
  Administered 2012-11-10 – 2012-11-11 (×6): 1 via ORAL
  Filled 2012-11-09 (×6): qty 1

## 2012-11-09 MED ORDER — HYDROMORPHONE HCL PF 1 MG/ML IJ SOLN
1.0000 mg | INTRAMUSCULAR | Status: AC | PRN
  Administered 2012-11-09 – 2012-11-10 (×3): 1 mg via INTRAVENOUS
  Filled 2012-11-09 (×3): qty 1

## 2012-11-09 MED ORDER — CLONAZEPAM 1 MG PO TABS
1.0000 mg | ORAL_TABLET | Freq: Three times a day (TID) | ORAL | Status: DC | PRN
  Administered 2012-11-10 – 2012-11-13 (×5): 1 mg via ORAL
  Filled 2012-11-09 (×7): qty 1

## 2012-11-09 MED ORDER — HYDROCHLOROTHIAZIDE 25 MG PO TABS
25.0000 mg | ORAL_TABLET | Freq: Every day | ORAL | Status: DC
  Administered 2012-11-09 – 2012-11-13 (×5): 25 mg via ORAL
  Filled 2012-11-09 (×5): qty 1

## 2012-11-09 MED ORDER — ASPIRIN 81 MG PO CHEW
81.0000 mg | CHEWABLE_TABLET | Freq: Every day | ORAL | Status: DC
  Administered 2012-11-09 – 2012-11-13 (×5): 81 mg via ORAL
  Filled 2012-11-09 (×5): qty 1

## 2012-11-09 MED ORDER — VANCOMYCIN HCL IN DEXTROSE 1-5 GM/200ML-% IV SOLN
1000.0000 mg | INTRAVENOUS | Status: AC
  Administered 2012-11-09: 1000 mg via INTRAVENOUS
  Filled 2012-11-09: qty 200

## 2012-11-09 MED ORDER — ENALAPRIL MALEATE 20 MG PO TABS
20.0000 mg | ORAL_TABLET | Freq: Every day | ORAL | Status: DC
  Administered 2012-11-09 – 2012-11-13 (×5): 20 mg via ORAL
  Filled 2012-11-09 (×5): qty 1

## 2012-11-09 MED ORDER — DEXTROSE 5 % IV SOLN
1.5000 g | Freq: Two times a day (BID) | INTRAVENOUS | Status: DC
  Administered 2012-11-10 (×2): 1.5 g via INTRAVENOUS
  Filled 2012-11-09 (×4): qty 1.5

## 2012-11-09 NOTE — ED Provider Notes (Signed)
History     CSN: [id number]  Arrival date & time 11/09/12  1901   First MD Initiated Contact with Patient 11/09/12 1931      Chief Complaint  Patient presents with  . Leg Pain    (Consider location/radiation/quality/duration/timing/severity/associated sxs/prior treatment) HPI Comments: 57 year old male with a history of vascular disease who presents with a complaint of left lower extremity pain and redness. The patient admits that he has been drinking alcohol for the last 3 days and admittedly is not a good historian, he is unable to tell me exactly set of his symptoms but over the last several days he admits that they've been getting worse including the redness on his leg and severe pain in his lower extremity which got worse today. No associated vomiting, associated change in the color of the foot and he feels like his left foot is cold. According to the medical record the patient has undergone an aorta bifemoral bypass in the past as well as a left femoral peroneal bypass which has been redone recently. The patient denies cough, shortness of breath, fevers chills nausea or vomiting, denies abdominal pain or swelling  Patient is a 57 y.o. male presenting with leg pain. The history is provided by the patient.  Leg Pain   Past Medical History  Diagnosis Date  . Tobacco abuse   . 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 Manson Passey).  Pt will likely require a wide resection and  has been refered to ENT (2/12)  . Dental caries   . Glucose intolerance (impaired glucose tolerance)     Max random CBG 130 (08/29/07) typically about 100. A1C in 11/08 was 5.4.  . Peripheral vascular disease   . Hypertension, essential   . Dyslipidemia   . Anxiety   . Thoracic kyphosis   . Back pain      The patient has chronic lumbar/thoracic back pain, he has kyphosis, mild facet hypertrophy at L1-L2, circumferential annular bulging  and mild vertebral body osteophytic formation at L2-L3, mild to moderate stenosis of the medial aspect of the neural foramen at L4-L5, and mild annular bulging at L5-S1.  Marland Kitchen History of alcohol abuse   . Diabetes mellitus   . Hyperlipidemia   . Peripheral arterial disease   . Leg pain   . Back pain   . Ulcer     Past Surgical History  Procedure Laterality Date  . Carotid endarterectomy    . Penile prosthesis implant  1985    secondary to a forklift acciednt and crushed pelvis  . Pr vein bypass graft,aorto-fem-pop  2003    Dr. Madilyn Fireman  . Pr vein bypass graft,aorto-fem-pop  05/12/11    Left fem-peroneal BPG   . Bypass graft femoral-peroneal  06/12/2012    Procedure: BYPASS GRAFT FEMORAL-PERONEAL;  Surgeon: Chuck Hint, MD;  Location: [facility] OR;  Service: Vascular;  Laterality: Left;  Redo fermoral - peroneal artery bypass using     composite propaten 6mm x 80cm and left saphenous vein.    Family History  Problem Relation Age of Onset  . Heart disease Mother   . Cancer Mother   . Alcohol abuse Father   . Heart disease Father   . Stroke Father     History  Substance Use Topics  . Smoking status: Current Every Day Smoker -- 0.50 packs/day for 40 years    Types: Cigarettes  .  Smokeless tobacco: Never Used     Comment: trying to quit  . Alcohol Use: No      Review of Systems  All other systems reviewed and are negative.    Allergies  Review of patient's allergies indicates no known allergies.  Home Medications   Current Outpatient Rx  Name  Route  Sig  Dispense  Refill  . aspirin 81 MG chewable tablet   Oral   Chew 81 mg by mouth daily.         . clonazePAM (KLONOPIN) 1 MG tablet   Oral   Take 1 mg by mouth 3 (three) times daily as needed for anxiety.         . enalapril (VASOTEC) 20 MG tablet   Oral   Take 1 tablet (20 mg total) by mouth daily.   30 tablet   3   . hydrochlorothiazide (HYDRODIURIL) 25 MG tablet   Oral   Take 1 tablet (25 mg total)  by mouth daily.   30 tablet   11   . HYDROcodone-acetaminophen (NORCO) 10-325 MG per tablet   Oral   Take 1 tablet by mouth every 4 (four) hours as needed.   150 tablet   5   . metoprolol (LOPRESSOR) 50 MG tablet   Oral   Take 50 mg by mouth 2 (two) times daily.         . Multiple Vitamins-Minerals (MULTIVITAMIN WITH MINERALS) tablet   Oral   Take 1 tablet by mouth daily.         Marland Kitchen omeprazole (PRILOSEC) 20 MG capsule   Oral   Take 1 capsule (20 mg total) by mouth daily.   30 capsule   11   . sertraline (ZOLOFT) 100 MG tablet   Oral   Take 1 tablet (100 mg total) by mouth daily.   30 tablet   11   . simvastatin (ZOCOR) 40 MG tablet   Oral   Take 1 tablet (40 mg total) by mouth at bedtime.   30 tablet   11   . traMADol (ULTRAM) 50 MG tablet   Oral   Take 50 mg by mouth every 6 (six) hours as needed for pain.         . varenicline (CHANTIX) 1 MG tablet   Oral   Take 1 mg by mouth 2 (two) times daily.         . Vitamins-Lipotropics (B COMPLEX FORMULA 1 PO)   Oral   Take 1 tablet by mouth daily.             BP 125/83  Pulse 114  Temp(Src) 98.1 F (36.7 C) (Oral)  Resp 18  Wt 162 lb (73.483 kg)  BMI 22.6 kg/m2  SpO2 98%  Physical Exam  Nursing note and vitals reviewed. Constitutional: He appears well-developed and well-nourished. No distress.  HENT:  Head: Normocephalic and atraumatic.  Mouth/Throat: Oropharynx is clear and moist. No oropharyngeal exudate.  Eyes: Conjunctivae and EOM are normal. Pupils are equal, round, and reactive to light. Right eye exhibits no discharge. Left eye exhibits no discharge. No scleral icterus.  Neck: Normal range of motion. Neck supple. No JVD present. No thyromegaly present.  Cardiovascular: Regular rhythm, normal heart sounds and intact distal pulses.  Exam reveals no gallop and no friction rub.   No murmur heard. Capillary refill normal on the right foot, normal warm perfused right foot with weakly palpable  pulses. Left foot is cold, erythematous, capillary refill 5-7 seconds,  no Dopplerable pulses. Tachycardia present  Pulmonary/Chest: Effort normal and breath sounds normal. No respiratory distress. He has no wheezes. He has no rales.  Abdominal: Soft. Bowel sounds are normal. He exhibits no distension and no mass. There is no tenderness.  Musculoskeletal: Normal range of motion. He exhibits no edema and no tenderness.  Lymphadenopathy:    He has no cervical adenopathy.  Neurological: He is alert. Coordination normal.  Skin: Skin is warm and dry. No rash noted. There is erythema (erythema to the left lower extremity below the knee anterior compartment down onto the foot).  Psychiatric: He has a normal mood and affect. His behavior is normal.    ED Course  Procedures (including critical care time)  Labs Reviewed  CBC WITH DIFFERENTIAL  BASIC METABOLIC PANEL  APTT  PROTIME-INR  TYPE AND SCREEN   No results found.   1. Ischemic leg   2. Cellulitis of left lower extremity       MDM  Patient with severe vascular disease, has a cellulitis of the left lower extremity as well as a very poor perfused leg. I have been unable to Doppler pulses at the foot, will talk to vascular surgery immediately, patient has a tachycardia to 110, he is afebrile, labs pending.  D/w Dr. Arbie Cookey who will admit   He will see the patient in the emergency department. Vancomycin ordered, pain medication ordered.      Vida Roller, MD 11/09/12 2038

## 2012-11-09 NOTE — ED Notes (Signed)
[facility] contacted to transport [facility] to [facility].

## 2012-11-09 NOTE — ED Notes (Signed)
Zinacef to be given as pre-op antibiotic per pharmacist, do not admin at this time.

## 2012-11-09 NOTE — ED Notes (Signed)
Verbal report given to [facility] RN.

## 2012-11-09 NOTE — H&P (Signed)
[facility]   Patient name: Andrew [name] MRN: [id number] DOB: [birth year] Sex: male   Referred by: [facility]  Reason for referral:  Chief Complaint  Patient presents with  . Leg Pain    HISTORY OF PRESENT ILLNESS: The patient is a 57 year old gentleman with a long history of lower extremity arterial insufficiency he is status post aortobifemoral bypass and left femoral to peroneal bypass in 2003. He presented in November of 2013 with occlusion of the left infrainguinal bypass. He underwent arteriography which revealed no flow through it is SFA popliteal anterior tibial posterior tibial peroneal trunk. He had flow in his distal peroneal. He was taken to the operating room by Dr. Edilia Bo where he underwent a redo left femoral to peroneal bypass with composite PTFE and saphenous vein. On the last event he had been a screw is occluded for approximately 3 weeks. He presents tonight to the [facility] long emergency department with a 3-4 day history of worsening pain in his left foot. He is able to walk with a walker only. He unfortunately continues to abuse alcohol and cigarettes and has been drinking tonight.  Past Medical History  Diagnosis Date  . Tobacco abuse   . 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 Manson Passey).  Pt will likely require a wide resection and  has been refered to ENT (2/12)  . Dental caries   . Glucose intolerance (impaired glucose tolerance)     Max random CBG 130 (08/29/07) typically about 100. A1C in 11/08 was 5.4.  . Peripheral vascular disease   . Hypertension, essential   . Dyslipidemia   . Anxiety   . Thoracic kyphosis   . Back pain      The patient has chronic lumbar/thoracic back pain, he has kyphosis, mild facet hypertrophy at L1-L2, circumferential annular bulging and mild vertebral body osteophytic formation at L2-L3, mild to moderate stenosis of the  medial aspect of the neural foramen at L4-L5, and mild annular bulging at L5-S1.  Marland Kitchen History of alcohol abuse   . Diabetes mellitus   . Hyperlipidemia   . Peripheral arterial disease   . Leg pain   . Back pain   . Ulcer     Past Surgical History  Procedure Laterality Date  . Carotid endarterectomy    . Penile prosthesis implant  1985    secondary to a forklift acciednt and crushed pelvis  . Pr vein bypass graft,aorto-fem-pop  2003    Dr. Madilyn Fireman  . Pr vein bypass graft,aorto-fem-pop  05/12/11    Left fem-peroneal BPG   . Bypass graft femoral-peroneal  06/12/2012    Procedure: BYPASS GRAFT FEMORAL-PERONEAL;  Surgeon: Chuck Hint, MD;  Location: [facility] OR;  Service: Vascular;  Laterality: Left;  Redo fermoral - peroneal artery bypass using     composite propaten 6mm x 80cm and left saphenous vein.    History   Social History  . Marital Status: Single    Spouse Name: N/A    Number of Children: N/A  . Years of Education: N/A   Occupational History  . Not on file.   Social History Main Topics  . Smoking status: Current Every Day Smoker -- 0.50 packs/day for 40 years    Types: Cigarettes  . Smokeless tobacco: Never Used     Comment: trying to quit  . Alcohol Use: No  . Drug Use:  No  . Sexually Active: Not on file   Other Topics Concern  . Not on file   Social History Narrative   Financial assistance approved for 100% discount at [facility] and has [facility] card per Rudell Cobb   05/25/2010 Patient lives alone, has no car, is not using alcohol.             Family History  Problem Relation Age of Onset  . Heart disease Mother   . Cancer Mother   . Alcohol abuse Father   . Heart disease Father   . Stroke Father     Allergies as of 11/09/2012  . (No Known Allergies)    No current facility-administered medications on file prior to encounter.   Current Outpatient Prescriptions on File Prior to Encounter  Medication Sig Dispense Refill  . aspirin 81 MG chewable  tablet Chew 81 mg by mouth daily.      . enalapril (VASOTEC) 20 MG tablet Take 1 tablet (20 mg total) by mouth daily.  30 tablet  3  . hydrochlorothiazide (HYDRODIURIL) 25 MG tablet Take 1 tablet (25 mg total) by mouth daily.  30 tablet  11  . HYDROcodone-acetaminophen (NORCO) 10-325 MG per tablet Take 1 tablet by mouth every 4 (four) hours as needed.  150 tablet  5  . Multiple Vitamins-Minerals (MULTIVITAMIN WITH MINERALS) tablet Take 1 tablet by mouth daily.      Marland Kitchen omeprazole (PRILOSEC) 20 MG capsule Take 1 capsule (20 mg total) by mouth daily.  30 capsule  11  . sertraline (ZOLOFT) 100 MG tablet Take 1 tablet (100 mg total) by mouth daily.  30 tablet  11  . simvastatin (ZOCOR) 40 MG tablet Take 1 tablet (40 mg total) by mouth at bedtime.  30 tablet  11  . Vitamins-Lipotropics (B COMPLEX FORMULA 1 PO) Take 1 tablet by mouth daily.           REVIEW OF SYSTEMS:  Positives indicated with an "X"  CARDIOVASCULAR:  [ ]  chest pain   [ ]  chest pressure   [ ]  palpitations   [ ]  orthopnea   [ ]  dyspnea on exertion   [ ]  claudication   [ ]  rest pain   [ ]  DVT   [ ]  phlebitis PULMONARY:   [ ]  productive cough   [ ]  asthma   [ ]  wheezing NEUROLOGIC:   [ ]  weakness  [ ]  paresthesias  [ ]  aphasia  [ ]  amaurosis  [ ]  dizziness HEMATOLOGIC:   [ ]  bleeding problems   [ ]  clotting disorders MUSCULOSKELETAL:  [ ]  joint pain   [ ]  joint swelling GASTROINTESTINAL: [ ]   blood in stool  [ ]   hematemesis GENITOURINARY:  [ ]   dysuria  [ ]   hematuria PSYCHIATRIC:  [ ]  history of major depression INTEGUMENTARY:  [ ]  rashes  [ ]  ulcers CONSTITUTIONAL:  [ ]  fever   [ ]  chills  PHYSICAL EXAMINATION:  General: The patient is a well-nourished male, in no acute distress. Vital signs are BP 119/84  Pulse 111  Temp(Src) 98.7 F (37.1 C) (Oral)  Resp 18  Wt 162 lb (73.483 kg)  BMI 22.6 kg/m2  SpO2 93% Pulmonary: There is a good air exchange bilaterally  Abdomen: Soft and non-tender with normal pitch bowel  sounds. No masses noted Musculoskeletal: There are no major deformities.  Neurologic: No focal weakness or paresthesias are detected, he does have some decreased sensation in his toes of his left foot  but motor is intact. Skin: There are no ulcer or rashes noted. He does have erythema over his left calf and pretibial area. Psychiatric: The patient has normal affect. Cardiovascular: There is a regular rate and rhythm without significant murmur appreciated. 2+ femoral pulses bilaterally. Absent distal pulses bilaterally. 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 I explained to patient that with a failure after 3-4 months this was a certainly a very bad indicator for long-term success. In reviewing Dr. Adele Dan operative note and his intraoperative arteriogram, he had a 2 mm peroneal vessel which is felt to be very small. I feel that he is at extremely high risk for amputation. He is at [facility] and understands that the surgery will be done at [facility]. We will transfer him to [facility] long hospital tonight. Currently 9:30 PM. Since he does have 3 or 4 days of occlusion and does have motor and sensory function, we will plan for surgery at 7:30 AM.    Giavonna Pflum [facility] [place] Office: [phone]

## 2012-11-09 NOTE — ED Notes (Signed)
Attempted to call report--rn unavailable. Will call back.

## 2012-11-09 NOTE — ED Notes (Signed)
EMS reports patient has had erythema and pain to left lower extremity and has also been drinking alcohol for 3 days.  Patient denies fevers.

## 2012-11-10 ENCOUNTER — Inpatient Hospital Stay (HOSPITAL_COMMUNITY): Payer: Medicare Other | Admitting: Certified Registered Nurse Anesthetist

## 2012-11-10 ENCOUNTER — Inpatient Hospital Stay (HOSPITAL_COMMUNITY): Payer: Medicare Other

## 2012-11-10 ENCOUNTER — Encounter: Payer: Self-pay | Admitting: Internal Medicine

## 2012-11-10 ENCOUNTER — Encounter (HOSPITAL_COMMUNITY): Payer: Self-pay | Admitting: Certified Registered Nurse Anesthetist

## 2012-11-10 ENCOUNTER — Encounter (HOSPITAL_COMMUNITY)
Admission: EM | Disposition: A | Discharge status: Home / Self Care | Payer: Self-pay | Source: Home / Self Care | Attending: Vascular Surgery

## 2012-11-10 ENCOUNTER — Encounter (HOSPITAL_COMMUNITY): Payer: Self-pay | Admitting: *Deleted

## 2012-11-10 DIAGNOSIS — R269 Unspecified abnormalities of gait and mobility: Secondary | ICD-10-CM | POA: Insufficient documentation

## 2012-11-10 DIAGNOSIS — I743 Embolism and thrombosis of arteries of the lower extremities: Secondary | ICD-10-CM

## 2012-11-10 HISTORY — PX: BYPASS GRAFT FEMORAL-PERONEAL: SHX5762

## 2012-11-10 LAB — CBC
MCH: 28.8 pg (ref 26.0–34.0)
MCHC: 33.7 g/dL (ref 30.0–36.0)
Platelets: 329 10*3/uL (ref 150–400)

## 2012-11-10 LAB — CREATININE, SERUM
Creatinine, Ser: 0.77 mg/dL (ref 0.50–1.35)
GFR calc non Af Amer: >90 mL/min (ref >90)

## 2012-11-10 SURGERY — CREATION, BYPASS, ARTERIAL, FEMORAL TO PERONEAL, USING GRAFT
Anesthesia: General | Site: Leg Lower | Laterality: Left | Wound class: Clean

## 2012-11-10 MED ORDER — ALBUMIN HUMAN 5 % IV SOLN
INTRAVENOUS | Status: DC | PRN
  Administered 2012-11-10: 09:00:00 via INTRAVENOUS

## 2012-11-10 MED ORDER — 0.9 % SODIUM CHLORIDE (POUR BTL) OPTIME
TOPICAL | Status: DC | PRN
  Administered 2012-11-10: 1000 mL

## 2012-11-10 MED ORDER — GUAIFENESIN-DM 100-10 MG/5ML PO SYRP: 15.0000 mL | ORAL_SOLUTION | ORAL | Status: DC | PRN

## 2012-11-10 MED ORDER — NEOSTIGMINE METHYLSULFATE 1 MG/ML IJ SOLN
INTRAMUSCULAR | Status: DC | PRN
  Administered 2012-11-10: 3 mg via INTRAVENOUS

## 2012-11-10 MED ORDER — SODIUM CHLORIDE 0.9 % IR SOLN
Status: DC | PRN
  Administered 2012-11-10: 09:00:00

## 2012-11-10 MED ORDER — ONDANSETRON HCL 4 MG/2ML IJ SOLN: 4.0000 mg | Freq: Three times a day (TID) | INTRAMUSCULAR | Status: AC | PRN

## 2012-11-10 MED ORDER — GLYCOPYRROLATE 0.2 MG/ML IJ SOLN
INTRAMUSCULAR | Status: DC | PRN
  Administered 2012-11-10: .4 mg via INTRAVENOUS

## 2012-11-10 MED ORDER — ACETAMINOPHEN 650 MG RE SUPP: 325.0000 mg | RECTAL | Status: DC | PRN

## 2012-11-10 MED ORDER — HYDROMORPHONE HCL PF 1 MG/ML IJ SOLN
INTRAMUSCULAR | Status: AC
  Filled 2012-11-10: qty 2

## 2012-11-10 MED ORDER — PANTOPRAZOLE SODIUM 40 MG PO TBEC
40.0000 mg | DELAYED_RELEASE_TABLET | Freq: Every day | ORAL | Status: DC
  Administered 2012-11-10 – 2012-11-12 (×2): 40 mg via ORAL
  Filled 2012-11-10 (×3): qty 1

## 2012-11-10 MED ORDER — HYDROMORPHONE HCL PF 1 MG/ML IJ SOLN
INTRAMUSCULAR | Status: AC
  Filled 2012-11-10: qty 1

## 2012-11-10 MED ORDER — ALUM & MAG HYDROXIDE-SIMETH 200-200-20 MG/5ML PO SUSP: 15.0000 mL | ORAL | Status: DC | PRN

## 2012-11-10 MED ORDER — DEXTROSE 5 % IV SOLN
INTRAVENOUS | Status: AC
  Filled 2012-11-10: qty 50

## 2012-11-10 MED ORDER — POTASSIUM CHLORIDE CRYS ER 20 MEQ PO TBCR: 20.0000 meq | EXTENDED_RELEASE_TABLET | Freq: Once | ORAL | Status: DC

## 2012-11-10 MED ORDER — PROPOFOL 10 MG/ML IV BOLUS
INTRAVENOUS | Status: DC | PRN
  Administered 2012-11-10: 150 mg via INTRAVENOUS

## 2012-11-10 MED ORDER — LIDOCAINE HCL (CARDIAC) 20 MG/ML IV SOLN
INTRAVENOUS | Status: DC | PRN
  Administered 2012-11-10: 40 mg via INTRAVENOUS

## 2012-11-10 MED ORDER — OXYCODONE-ACETAMINOPHEN 5-325 MG PO TABS
ORAL_TABLET | ORAL | Status: AC
  Filled 2012-11-10: qty 2

## 2012-11-10 MED ORDER — PHENOL 1.4 % MT LIQD
1.0000 | OROMUCOSAL | Status: DC | PRN
  Filled 2012-11-10: qty 177

## 2012-11-10 MED ORDER — HYDRALAZINE HCL 20 MG/ML IJ SOLN: 10.0000 mg | INTRAMUSCULAR | Status: DC | PRN

## 2012-11-10 MED ORDER — PANTOPRAZOLE SODIUM 40 MG PO TBEC: 40.0000 mg | DELAYED_RELEASE_TABLET | Freq: Every day | ORAL | Status: DC

## 2012-11-10 MED ORDER — MAGNESIUM SULFATE 40 MG/ML IJ SOLN
2.0000 g | Freq: Once | INTRAMUSCULAR | Status: AC | PRN
  Filled 2012-11-10: qty 50

## 2012-11-10 MED ORDER — LACTATED RINGERS IV SOLN
INTRAVENOUS | Status: DC | PRN
  Administered 2012-11-10 (×3): via INTRAVENOUS

## 2012-11-10 MED ORDER — EPHEDRINE SULFATE 50 MG/ML IJ SOLN
INTRAMUSCULAR | Status: DC | PRN
  Administered 2012-11-10: 10 mg via INTRAVENOUS

## 2012-11-10 MED ORDER — KCL IN DEXTROSE-NACL 20-5-0.45 MEQ/L-%-% IV SOLN
INTRAVENOUS | Status: DC
  Administered 2012-11-10: 02:00:00 via INTRAVENOUS
  Filled 2012-11-10 (×2): qty 1000

## 2012-11-10 MED ORDER — HYDROMORPHONE HCL PF 1 MG/ML IJ SOLN
0.2500 mg | INTRAMUSCULAR | Status: DC | PRN
  Administered 2012-11-10 (×5): 0.5 mg via INTRAVENOUS

## 2012-11-10 MED ORDER — POTASSIUM CHLORIDE CRYS ER 20 MEQ PO TBCR: 20.0000 meq | EXTENDED_RELEASE_TABLET | Freq: Once | ORAL | Status: AC | PRN

## 2012-11-10 MED ORDER — VECURONIUM BROMIDE 10 MG IV SOLR
INTRAVENOUS | Status: DC | PRN
  Administered 2012-11-10 (×2): 2 mg via INTRAVENOUS

## 2012-11-10 MED ORDER — SODIUM CHLORIDE 0.9 % IV SOLN: 500.0000 mL | Freq: Once | INTRAVENOUS | Status: AC | PRN

## 2012-11-10 MED ORDER — CEFUROXIME SODIUM 1.5 G IJ SOLR
INTRAMUSCULAR | Status: AC
  Filled 2012-11-10: qty 1.5

## 2012-11-10 MED ORDER — HEPARIN SODIUM (PORCINE) 1000 UNIT/ML IJ SOLN
INTRAMUSCULAR | Status: DC | PRN
  Administered 2012-11-10: 7000 [IU] via INTRAVENOUS

## 2012-11-10 MED ORDER — ACETAMINOPHEN 325 MG PO TABS: 325.0000 mg | ORAL_TABLET | ORAL | Status: DC | PRN

## 2012-11-10 MED ORDER — ONDANSETRON HCL 4 MG/2ML IJ SOLN
INTRAMUSCULAR | Status: DC | PRN
  Administered 2012-11-10: 4 mg via INTRAVENOUS

## 2012-11-10 MED ORDER — FENTANYL CITRATE 0.05 MG/ML IJ SOLN
INTRAMUSCULAR | Status: DC | PRN
  Administered 2012-11-10 (×2): 50 ug via INTRAVENOUS
  Administered 2012-11-10: 100 ug via INTRAVENOUS
  Administered 2012-11-10 (×3): 50 ug via INTRAVENOUS

## 2012-11-10 MED ORDER — OXYCODONE-ACETAMINOPHEN 5-325 MG PO TABS
1.0000 | ORAL_TABLET | ORAL | Status: DC | PRN
  Administered 2012-11-10 – 2012-11-12 (×9): 2 via ORAL
  Administered 2012-11-12: 1 via ORAL
  Administered 2012-11-12 – 2012-11-13 (×4): 2 via ORAL
  Filled 2012-11-10 (×12): qty 2
  Filled 2012-11-10: qty 1
  Filled 2012-11-10 (×3): qty 2

## 2012-11-10 MED ORDER — MIDAZOLAM HCL 5 MG/5ML IJ SOLN
INTRAMUSCULAR | Status: DC | PRN
  Administered 2012-11-10: 2 mg via INTRAVENOUS

## 2012-11-10 MED ORDER — ROCURONIUM BROMIDE 100 MG/10ML IV SOLN
INTRAVENOUS | Status: DC | PRN
  Administered 2012-11-10: 50 mg via INTRAVENOUS

## 2012-11-10 MED ORDER — IOHEXOL 300 MG/ML  SOLN
INTRAMUSCULAR | Status: DC | PRN
  Administered 2012-11-10: 20 mL via INTRAVENOUS

## 2012-11-10 MED ORDER — PHENYLEPHRINE HCL 10 MG/ML IJ SOLN
INTRAMUSCULAR | Status: DC | PRN
  Administered 2012-11-10: 40 ug via INTRAVENOUS
  Administered 2012-11-10 (×2): 80 ug via INTRAVENOUS

## 2012-11-10 MED ORDER — SODIUM CHLORIDE 0.9 % IV SOLN: INTRAVENOUS | Status: AC

## 2012-11-10 MED ORDER — KCL IN DEXTROSE-NACL 20-5-0.45 MEQ/L-%-% IV SOLN
INTRAVENOUS | Status: DC
  Administered 2012-11-10: 22:00:00 via INTRAVENOUS
  Filled 2012-11-10 (×7): qty 1000

## 2012-11-10 MED ORDER — METOPROLOL TARTRATE 1 MG/ML IV SOLN: 2.0000 mg | INTRAVENOUS | Status: DC | PRN

## 2012-11-10 MED ORDER — ENOXAPARIN SODIUM 40 MG/0.4ML ~~LOC~~ SOLN
40.0000 mg | SUBCUTANEOUS | Status: DC
Start: 2012-11-10 — End: 2012-11-10
  Filled 2012-11-10: qty 0.4

## 2012-11-10 MED ORDER — PHENOL 1.4 % MT LIQD: 1.0000 | OROMUCOSAL | Status: DC | PRN

## 2012-11-10 MED ORDER — DOCUSATE SODIUM 100 MG PO CAPS
100.0000 mg | ORAL_CAPSULE | Freq: Every day | ORAL | Status: DC
  Administered 2012-11-11 – 2012-11-13 (×3): 100 mg via ORAL
  Filled 2012-11-10 (×3): qty 1

## 2012-11-10 MED ORDER — PHENYLEPHRINE HCL 10 MG/ML IJ SOLN
10.0000 mg | INTRAMUSCULAR | Status: DC | PRN
  Administered 2012-11-10: 25 ug/min via INTRAVENOUS

## 2012-11-10 MED ORDER — LABETALOL HCL 5 MG/ML IV SOLN
10.0000 mg | INTRAVENOUS | Status: DC | PRN
  Filled 2012-11-10: qty 4

## 2012-11-10 MED ORDER — ONDANSETRON HCL 4 MG/2ML IJ SOLN: 4.0000 mg | Freq: Four times a day (QID) | INTRAMUSCULAR | Status: DC | PRN

## 2012-11-10 MED ORDER — ENOXAPARIN SODIUM 40 MG/0.4ML ~~LOC~~ SOLN
40.0000 mg | SUBCUTANEOUS | Status: DC
  Administered 2012-11-10 – 2012-11-12 (×3): 40 mg via SUBCUTANEOUS
  Filled 2012-11-10 (×4): qty 0.4

## 2012-11-10 SURGICAL SUPPLY — 66 items
ADH SKN CLS APL DERMABOND .7 (GAUZE/BANDAGES/DRESSINGS) ×2
APL SKNCLS STERI-STRIP NONHPOA (GAUZE/BANDAGES/DRESSINGS) ×2
BANDAGE ESMARK 6X9 LF (GAUZE/BANDAGES/DRESSINGS) IMPLANT
BENZOIN TINCTURE PRP APPL 2/3 (GAUZE/BANDAGES/DRESSINGS) ×3 IMPLANT
BNDG CMPR 9X6 STRL LF SNTH (GAUZE/BANDAGES/DRESSINGS)
BNDG ESMARK 6X9 LF (GAUZE/BANDAGES/DRESSINGS)
CANISTER SUCTION 2500CC (MISCELLANEOUS) ×2 IMPLANT
CANNULA VESSEL W/WING WO/VALVE (CANNULA) ×2 IMPLANT
CATH COUDE FOLEY 2W 5CC 16FR (CATHETERS) ×1 IMPLANT
CATH EMB 3FR 40CM (CATHETERS) ×1 IMPLANT
CATH EMB 3FR 80CM (CATHETERS) ×1 IMPLANT
CATH EMB 5FR 80CM (CATHETERS) ×2 IMPLANT
CLIP FOGARTY SPRING 6M (CLIP) IMPLANT
CLIP LIGATING EXTRA MED SLVR (CLIP) ×2 IMPLANT
CLIP LIGATING EXTRA SM BLUE (MISCELLANEOUS) ×2 IMPLANT
CLOTH BEACON ORANGE TIMEOUT ST (SAFETY) ×2 IMPLANT
CLSR STERI-STRIP ANTIMIC 1/2X4 (GAUZE/BANDAGES/DRESSINGS) ×2 IMPLANT
COVER SURGICAL LIGHT HANDLE (MISCELLANEOUS) ×2 IMPLANT
CUFF TOURNIQUET SINGLE 34IN LL (TOURNIQUET CUFF) IMPLANT
CUFF TOURNIQUET SINGLE 44IN (TOURNIQUET CUFF) IMPLANT
DECANTER SPIKE VIAL GLASS SM (MISCELLANEOUS) ×1 IMPLANT
DERMABOND ADVANCED (GAUZE/BANDAGES/DRESSINGS) ×2
DERMABOND ADVANCED .7 DNX12 (GAUZE/BANDAGES/DRESSINGS) IMPLANT
DRAIN SNY 10X20 3/4 PERF (WOUND CARE) IMPLANT
DRAPE WARM FLUID 44X44 (DRAPE) ×2 IMPLANT
DRAPE X-RAY CASS 24X20 (DRAPES) ×1 IMPLANT
DRSG COVADERM 4X10 (GAUZE/BANDAGES/DRESSINGS) IMPLANT
DRSG COVADERM 4X8 (GAUZE/BANDAGES/DRESSINGS) IMPLANT
ELECT REM PT RETURN 9FT ADLT (ELECTROSURGICAL) ×2
ELECTRODE REM PT RTRN 9FT ADLT (ELECTROSURGICAL) ×1 IMPLANT
EVACUATOR SILICONE 100CC (DRAIN) IMPLANT
GLOVE BIO SURGEON STRL SZ 6.5 (GLOVE) ×1 IMPLANT
GLOVE BIO SURGEON STRL SZ7 (GLOVE) ×2 IMPLANT
GLOVE BIOGEL PI IND STRL 6.5 (GLOVE) IMPLANT
GLOVE BIOGEL PI IND STRL 7.0 (GLOVE) IMPLANT
GLOVE BIOGEL PI INDICATOR 6.5 (GLOVE) ×5
GLOVE BIOGEL PI INDICATOR 7.0 (GLOVE) ×2
GLOVE SS BIOGEL STRL SZ 7.5 (GLOVE) ×1 IMPLANT
GLOVE SUPERSENSE BIOGEL SZ 7.5 (GLOVE) ×1
GOWN STRL NON-REIN LRG LVL3 (GOWN DISPOSABLE) ×7 IMPLANT
INSERT FOGARTY SM (MISCELLANEOUS) IMPLANT
KIT BASIN OR (CUSTOM PROCEDURE TRAY) ×2 IMPLANT
KIT ROOM TURNOVER OR (KITS) ×2 IMPLANT
NS IRRIG 1000ML POUR BTL (IV SOLUTION) ×4 IMPLANT
PACK PERIPHERAL VASCULAR (CUSTOM PROCEDURE TRAY) ×2 IMPLANT
PAD ARMBOARD 7.5X6 YLW CONV (MISCELLANEOUS) ×4 IMPLANT
PADDING CAST COTTON 6X4 STRL (CAST SUPPLIES) IMPLANT
SET COLLECT BLD 21X3/4 12 (NEEDLE) IMPLANT
SPONGE GAUZE 4X4 12PLY (GAUZE/BANDAGES/DRESSINGS) ×3 IMPLANT
STAPLER VISISTAT 35W (STAPLE) IMPLANT
STOPCOCK 4 WAY LG BORE MALE ST (IV SETS) ×1 IMPLANT
STRIP CLOSURE SKIN 1/2X4 (GAUZE/BANDAGES/DRESSINGS) ×2 IMPLANT
SUT ETHILON 3 0 PS 1 (SUTURE) IMPLANT
SUT PROLENE 5 0 C 1 24 (SUTURE) ×2 IMPLANT
SUT PROLENE 6 0 CC (SUTURE) ×2 IMPLANT
SUT SILK 2 0 SH (SUTURE) ×2 IMPLANT
SUT VIC AB 2-0 CTX 36 (SUTURE) ×4 IMPLANT
SUT VIC AB 3-0 SH 27 (SUTURE) ×4
SUT VIC AB 3-0 SH 27X BRD (SUTURE) ×2 IMPLANT
SYR TB 1ML LUER SLIP (SYRINGE) ×1 IMPLANT
TOWEL OR 17X24 6PK STRL BLUE (TOWEL DISPOSABLE) ×4 IMPLANT
TOWEL OR 17X26 10 PK STRL BLUE (TOWEL DISPOSABLE) ×5 IMPLANT
TRAY FOLEY CATH 14FRSI W/METER (CATHETERS) ×2 IMPLANT
TUBING EXTENTION W/L.L. (IV SETS) ×1 IMPLANT
UNDERPAD 30X30 INCONTINENT (UNDERPADS AND DIAPERS) ×2 IMPLANT
WATER STERILE IRR 1000ML POUR (IV SOLUTION) ×2 IMPLANT

## 2012-11-10 NOTE — Anesthesia Procedure Notes (Signed)
Procedure Name: Intubation Date/Time: 11/10/2012 7:57 AM Performed by: Angelica Pou Pre-anesthesia Checklist: Patient identified, Timeout performed, Emergency Drugs available, Suction available and Patient being monitored Patient Re-evaluated:Patient Re-evaluated prior to inductionOxygen Delivery Method: Circle system utilized Preoxygenation: Pre-oxygenation with 100% oxygen Intubation Type: IV induction Ventilation: Mask ventilation without difficulty and Oral airway inserted - appropriate to patient size Laryngoscope Size: Mac and 3 Grade View: Grade I Tube type: Oral Tube size: 7.5 mm Number of attempts: 1 Airway Equipment and Method: Stylet and Oral airway Placement Confirmation: ETT inserted through vocal cords under direct vision,  breath sounds checked- equal and bilateral and positive ETCO2 Secured at: 23 cm Tube secured with: Tape Dental Injury: Teeth and Oropharynx as per pre-operative assessment

## 2012-11-10 NOTE — Anesthesia Preprocedure Evaluation (Addendum)
Anesthesia Evaluation  Patient identified by MRN, date of birth, ID band Patient awake    Reviewed: Allergy & Precautions, H&P , NPO status , Patient's Chart, lab work & pertinent test results, reviewed documented beta blocker date and time   History of Anesthesia Complications Negative for: history of anesthetic complications  Airway Mallampati: II TM Distance: >3 FB Neck ROM: Full    Dental  (+) Edentulous Upper, Poor Dentition and Dental Advisory Given   Pulmonary Current Smoker,  breath sounds clear to auscultation  Pulmonary exam normal       Cardiovascular hypertension, Pt. on medications and Pt. on home beta blockers + Peripheral Vascular Disease Rhythm:Regular Rate:Normal     Neuro/Psych Anxiety negative neurological ROS     GI/Hepatic GERD-  Medicated and Controlled,(+)     substance abuse  alcohol use,   Endo/Other  diabetesGlucose intolerance   Renal/GU      Musculoskeletal   Abdominal   Peds  Hematology negative hematology ROS (+)   Anesthesia Other Findings   Reproductive/Obstetrics                           Anesthesia Physical Anesthesia Plan  ASA: III  Anesthesia Plan: General   Post-op Pain Management:    Induction: Intravenous  Airway Management Planned: Oral ETT  Additional Equipment:   Intra-op Plan:   Post-operative Plan: Extubation in OR  Informed Consent: I have reviewed the patients History and Physical, chart, labs and discussed the procedure including the risks, benefits and alternatives for the proposed anesthesia with the patient or authorized representative who has indicated his/her understanding and acceptance.   Dental advisory given  Plan Discussed with: CRNA, Anesthesiologist and Surgeon  Anesthesia Plan Comments:         Anesthesia Quick Evaluation

## 2012-11-10 NOTE — Progress Notes (Signed)
11/10/2012 6:53 AM Pt with recent CXR < 1 year ago (06/11/2012).  CXR cancelled. Arianie Couse, Avie Echevaria , RN

## 2012-11-10 NOTE — Preoperative (Signed)
Beta Blockers   Reason not to administer Beta Blockers:Not Applicable Took metoprolol 4/3 pm per MAR.

## 2012-11-10 NOTE — Anesthesia Postprocedure Evaluation (Signed)
  Anesthesia Post-op Note  Patient: Andrew [name]  Procedure(s) Performed: Procedure(s): Thrombectomy and Revision Left FEMORAL-PERONEAL Bypass Graft (Left)  Patient Location: PACU  Anesthesia Type:General  Level of Consciousness: awake  Airway and Oxygen Therapy: Patient Spontanous Breathing  Post-op Pain: mild  Post-op Assessment: Post-op Vital signs reviewed  Post-op Vital Signs: Reviewed  Complications: No apparent anesthesia complications

## 2012-11-10 NOTE — ED Notes (Signed)
Patient ambulated unassisted to restroom. Patient did not call for assistance prior to ambulation.

## 2012-11-10 NOTE — Transfer of Care (Signed)
Immediate Anesthesia Transfer of Care Note  Patient: Andrew [name]  Procedure(s) Performed: Procedure(s): Thrombectomy and Revision Left FEMORAL-PERONEAL Bypass Graft (Left)  Patient Location: PACU  Anesthesia Type:General  Level of Consciousness: awake, sedated and patient cooperative  Airway & Oxygen Therapy: Patient Spontanous Breathing and Patient connected to nasal cannula oxygen  Post-op Assessment: Report given to PACU RN, Post -op Vital signs reviewed and stable and Patient moving all extremities X 4  Post vital signs: Reviewed and stable  Complications: No apparent anesthesia complications

## 2012-11-10 NOTE — Op Note (Signed)
OPERATIVE REPORT  DATE OF SURGERY: 11/10/2012  PATIENT: Andrew [name], 57 y.o. male MRN: [id number]  DOB: 1955-12-23  PRE-OPERATIVE DIAGNOSIS: Left foot ischemia occluded left femoral to peroneal bypass  POST-OPERATIVE DIAGNOSIS:  Same  PROCEDURE: Thrombectomy of left femoral to peroneal bypass and ReVision with vein patch angioplasty  SURGEON:  Gretta Began, M.D.  ASSISTANT: Nurse  ANESTHESIA:  Gen.  EBL: 100 ml  Total I/O In: 2250 [I.V.:2000; IV Piggyback:250] Out: 850 [Urine:750; Blood:100]  BLOOD ADMINISTERED: None  DRAINS: None  SPECIMEN: None  COUNTS CORRECT:  YES  PLAN OF CARE: PACU   PATIENT DISPOSITION:  PACU - hemodynamically stable  PROCEDURE DETAILS: The patient is a 57 year old gentleman with a long history of lower surety revascularization. He had undergone initial aortobifemoral bypass and left femoral to peroneal bypass by Dr. Madilyn Fireman in 2003. He subsequently presented with recurrent occlusion and ischemia of his left leg. He underwent formal arteriogram and new left femoral to peroneal bypass with Dr. Edilia Bo in November of 2013. He had a composite Gore-Tex graft and saphenous vein graft. He presented to [facility] long emergency department last night with several day history of worsening pain. He did have motor and sensory function and was taken to the operating room this morning for surgery.  Procedure in detail the patient right and left legs were prepped and draped in usual sterile fashion. An incision was made over the medial aspect of the cath at the level of the prior bypass. Incision was carried down to the level of the prior Gore-Tex to vein anastomosis. This was then extended down to expose the peroneal artery anastomosis as well. The graft Gore-Tex was opened over the hood of the prior Gore-Tex to vein anastomosis. A 3 Fogarty catheter passed through the distal anastomosis and there was good arterial backbleeding. Next the Gore-Tex graft itself was  thrombectomized. This was able to pass through the prior anastomosis to the left limb of the aortofemoral graft and good inflow was encountered. It should be noted that the graft had been occluded for quite some time and had been complete lysis of the clot in the graft itself. The patient was given 7000 intravenous heparin. An intraoperative arteriogram was obtained through the prior existing vein composite extension this showed good flow into the peroneal artery with no evidence of anastomotic stenosis. There did appear to be a narrowing at the prior Gore-Tex to vein anastomosis. The vein itself was small at approximately 2-1/2 mm in diameter lumen. Incision was made over the right ankle over the saphenous vein and a short segment of saphenous vein was harvested. This was opened longitudinally and was used as a vein patch over the junction of the old Gore-Tex graft the old vein graft. This was with a running 6-0 Prolene suture. Clamps removed and excellent flow was noted in the graft and in the peroneal artery below the anastomosis and at the lateral ankle. The patient's heparin was not reversed with protamine. The wounds were irrigated with saline. Hemostasis electrocautery. Wounds were closed with 3-0 Vicryl in the subcutaneous and subcuticular tissue. Benzoin and Steri-Strips were applied the patient was taken to the recovery room in stable condition   Gretta Began, M.D. 11/10/2012 10:17 AM

## 2012-11-10 NOTE — Interval H&P Note (Signed)
History and Physical Interval Note:  11/10/2012 7:39 AM  Andrew [name]  has presented today for surgery, with the diagnosis of ESRD  The various methods of treatment have been discussed with the patient and family. After consideration of risks, benefits and other options for treatment, the patient has consented to  Procedure(s): Thrombectomy/Revision Left FEMORAL-PERONEAL Bypass Graft (Left) as a surgical intervention .  The patient's history has been reviewed, patient examined, no change in status, stable for surgery.  I have reviewed the patient's chart and labs.  Questions were answered to the patient's satisfaction.     EARLY, TODD

## 2012-11-10 NOTE — ED Notes (Signed)
Patient taken from facility via [facility]

## 2012-11-11 LAB — CBC
HCT: 31.7 % — ABNORMAL LOW (ref 39.0–52.0)
Hemoglobin: 10.7 g/dL — ABNORMAL LOW (ref 13.0–17.0)
WBC: 9.8 10*3/uL (ref 4.0–10.5)

## 2012-11-11 LAB — BASIC METABOLIC PANEL
BUN: 4 mg/dL — ABNORMAL LOW (ref 6–23)
Chloride: 99 mEq/L (ref 96–112)
Glucose, Bld: 127 mg/dL — ABNORMAL HIGH (ref 70–99)
Potassium: 3.9 mEq/L (ref 3.5–5.1)

## 2012-11-11 NOTE — Progress Notes (Signed)
Subjective: Interval History: none.. comfortable  Objective: Vital signs in last 24 hours: Temp:  [97.3 F (36.3 C)-98.4 F (36.9 C)] 97.4 F (36.3 C) (04/06 0415) Pulse Rate:  [73-102] 75 (04/06 0415) Resp:  [14-19] 18 (04/06 0415) BP: (131-165)/(71-98) 153/85 mmHg (04/06 0415) SpO2:  [96 %-100 %] 98 % (04/06 0415)  Intake/Output from previous day: 04/05 0701 - 04/06 0700 In: 2610 [P.O.:360; I.V.:2000; IV Piggyback:250] Out: 1850 [Urine:1750; Blood:100] Intake/Output this shift:    The calf incision healing. Foot well-perfused. Dusky left second toe and tip of fifth toe. Palpable fem-fem peroneal graft pulse  Lab Results:  Recent Labs  11/10/12 0445 11/11/12 0505  WBC 9.1 9.8  HGB 12.0* 10.7*  HCT 35.6* 31.7*  PLT 329 294   BMET  Recent Labs  11/09/12 1943 11/10/12 0445 11/11/12 0505  NA 142  --  136  K 3.8  --  3.9  CL 99  --  99  CO2 28  --  29  GLUCOSE 116*  --  127*  BUN 9  --  4*  CREATININE 0.72 0.77 0.58  CALCIUM 9.7  --  9.3    Studies/Results: Dg Ang/ext/uni/or Left  11/10/2012  *RADIOLOGY REPORT*  Clinical data:  Peripheral vascular disease  INTRAOPERATIVE ARTERIOGRAM  Comparison:  06/12/2012  Findings:  This single intraoperative arteriogram demonstrates a graft to the peroneal artery below the calf.  Distal anastomosis is patent.  Peroneal artery is patent distally to just above the ankle.  IMPRESSION: Patent graft to the mid peroneal artery.   Original Report Authenticated By: D. Andria Rhein, MD    Anti-infectives: Anti-infectives   Start     Dose/Rate Route Frequency Ordered Stop   11/10/12 0732  cefUROXime (ZINACEF) 1.5 G injection    Comments:  HIGHTOWER, SARAH: cabinet override      11/10/12 0732 11/10/12 1944   11/09/12 2300  cefUROXime (ZINACEF) 1.5 g in dextrose 5 % 50 mL IVPB  Status:  Discontinued     1.5 g 100 mL/hr over 30 Minutes Intravenous Every 12 hours 11/09/12 2245 11/10/12 1300   11/09/12 1945  vancomycin (VANCOCIN) IVPB  1000 mg/200 mL premix     1,000 mg 200 mL/hr over 60 Minutes Intravenous STAT 11/09/12 1942 11/09/12 2133      Assessment/Plan: s/p Procedure(s): Thrombectomy and Revision Left FEMORAL-PERONEAL Bypass Graft (Left) Doing well postop day 1. Mobilize. Potential discharge in the morning if comfortable walking   LOS: 2 days   Andrew [name] 11/11/2012, 9:49 AM

## 2012-11-11 NOTE — Evaluation (Signed)
Physical Therapy Evaluation Patient Details Name: Andrew [name] MRN: [id number] DOB: 02/09/1956 Today's Date: 11/11/2012 Time: 1351-1410 PT Time Calculation (min): 19 min  PT Assessment / Plan / Recommendation Clinical Impression  Pt s/p left thrombectomy and revision of left femoral to peroneal BPG.  Pt with minimal balance problems.  Will need to practice going up and down steps.  HHPT recommended.      PT Assessment  Patient needs continued PT services    Follow Up Recommendations  Home health PT;Supervision - Intermittent         Barriers to Discharge Decreased caregiver support      Equipment Recommendations  None recommended by PT         Frequency Min 3X/week    Precautions / Restrictions Precautions Precautions: Fall Restrictions Weight Bearing Restrictions: No   Pertinent Vitals/Pain VSS, no pain      Mobility  Bed Mobility Bed Mobility: Rolling Left;Left Sidelying to Sit Rolling Left: 7: Independent Left Sidelying to Sit: 7: Independent Transfers Transfers: Sit to Stand;Stand to Sit Sit to Stand: 7: Independent Stand to Sit: 7: Independent Ambulation/Gait Ambulation/Gait Assistance: 4: Min guard Ambulation Distance (Feet): 250 Feet Assistive device: Rolling walker Ambulation/Gait Assistance Details: Pt does well overall.  Shaky toward end of walk needing min guard assist but VSS.   Gait Pattern: Step-to pattern;Antalgic;Decreased step length - left;Decreased stride length Gait velocity: decreased Stairs: No Wheelchair Mobility Wheelchair Mobility: No         PT Diagnosis: Generalized weakness  PT Problem List: Decreased safety awareness;Decreased knowledge of precautions;Decreased knowledge of use of DME;Decreased activity tolerance;Decreased balance;Decreased mobility PT Treatment Interventions: DME instruction;Gait training;Stair training;Functional mobility training;Therapeutic activities;Balance training;Patient/family education   PT  Goals Acute Rehab PT Goals PT Goal Formulation: With patient Time For Goal Achievement: 11/16/12 Potential to Achieve Goals: Good Pt will Ambulate: >150 feet;with modified independence;with least restrictive assistive device PT Goal: Ambulate - Progress: Goal set today Pt will Go Up / Down Stairs: 3-5 stairs;with modified independence;with least restrictive assistive device PT Goal: Up/Down Stairs - Progress: Goal set today  Visit Information  Last PT Received On: 11/11/12 Assistance Needed: +1    Subjective Data  Subjective: "I feel pretty good." Patient Stated Goal: to go home   Prior Functioning  Home Living Lives With: Alone Available Help at Discharge: Family;Available PRN/intermittently;Other (Comment) (lives in addition onto brother's home, calls brother at time) Type of Home: House Home Access: Stairs to enter Secretary/administrator of Steps: 3 Entrance Stairs-Rails: None Home Layout: One level Bathroom Shower/Tub: Engineer, manufacturing systems: Standard Home Adaptive Equipment: Tub transfer bench;Walker - standard;Bedside commode/3-in-1 Prior Function Level of Independence: Independent with assistive device(s) Able to Take Stairs?: Yes Driving: No Communication Communication: No difficulties    Cognition  Cognition Overall Cognitive Status: Appears within functional limits for tasks assessed/performed Arousal/Alertness: Awake/alert Orientation Level: Appears intact for tasks assessed Behavior During Session: [facility] for tasks performed    Extremity/Trunk Assessment Right Lower Extremity Assessment RLE ROM/Strength/Tone: [facility] for tasks assessed Left Lower Extremity Assessment LLE ROM/Strength/Tone: Deficits;Due to pain;Unable to fully assess LLE ROM/Strength/Tone Deficits: grossly 3/5   Balance Static Standing Balance Static Standing - Balance Support: Bilateral upper extremity supported;During functional activity Static Standing - Level of Assistance: 5: Stand  by assistance Static Standing - Comment/# of Minutes: 2  End of Session PT - End of Session Equipment Utilized During Treatment: Gait belt Activity Tolerance: Patient tolerated treatment well Patient left: in chair;with call bell/phone within reach Nurse Communication:  Mobility status       [name],[name] 11/11/2012, 2:20 PM [facility] Acute Rehabilitation [phone] (pager)

## 2012-11-12 ENCOUNTER — Encounter (HOSPITAL_COMMUNITY): Payer: Self-pay | Admitting: Vascular Surgery

## 2012-11-12 DIAGNOSIS — I739 Peripheral vascular disease, unspecified: Secondary | ICD-10-CM

## 2012-11-12 NOTE — Discharge Summary (Signed)
[facility] Discharge Summary  Andrew [name] May 22, 1956 57 y.o. male  [id number]  Admission Date: 11/09/2012  Discharge Date: 11-13-2012  Physician: Larina Earthly, MD  Admission Diagnosis: Ischemic leg [459.9] Cellulitis of left lower extremity [682.6]   HPI:   This is a 57 y.o. male with a long history of lower extremity arterial insufficiency he is status post aortobifemoral bypass and left femoral to peroneal bypass in 2003. He presented in November of 2013 with occlusion of the left infrainguinal bypass. He underwent arteriography which revealed no flow through it is SFA popliteal anterior tibial posterior tibial peroneal trunk. He had flow in his distal peroneal. He was taken to the operating room by Dr. Edilia Bo where he underwent a redo left femoral to peroneal bypass with composite PTFE and saphenous vein. On the last event he had been a screw is occluded for approximately 3 weeks. He presents tonight to the [facility] long emergency department with a 3-4 day history of worsening pain in his left foot. He is able to walk with a walker only. He unfortunately continues to abuse alcohol and cigarettes and has been drinking tonight.   Hospital Course:  The patient was admitted to the hospital and taken to the operating room on 11/10/2012 and underwent: Thrombectomy of left femoral to peroneal bypass and ReVision with vein patch angioplasty    The pt tolerated the procedure well and was transported to the PACU in good condition. By POD 1, he was doing well and was transferred to the telemetry floor.  Worked with PT and is ambulating with rolling walker.  No new complaints.    The remainder of the hospital course consisted of increasing mobilization and increasing intake of solids without difficulty.  CBC    Component Value Date/Time   WBC 9.8 11/11/2012 0505   RBC 3.71* 11/11/2012 0505   HGB 10.7* 11/11/2012 0505   HCT 31.7* 11/11/2012 0505   PLT 294 11/11/2012 0505   MCV 85.4  11/11/2012 0505   MCH 28.8 11/11/2012 0505   MCHC 33.8 11/11/2012 0505   RDW 14.0 11/11/2012 0505   LYMPHSABS 1.4 11/09/2012 1943   MONOABS 0.8 11/09/2012 1943   EOSABS 0.2 11/09/2012 1943   BASOSABS 0.0 11/09/2012 1943    BMET    Component Value Date/Time   NA 136 11/11/2012 0505   K 3.9 11/11/2012 0505   CL 99 11/11/2012 0505   CO2 29 11/11/2012 0505   GLUCOSE 127* 11/11/2012 0505   BUN 4* 11/11/2012 0505   CREATININE 0.58 11/11/2012 0505   CALCIUM 9.3 11/11/2012 0505   GFRNONAA >90 11/11/2012 0505   GFRAA >90 11/11/2012 0505     Discharge Instructions:   The patient is discharged to home with extensive instructions on wound care and progressive ambulation.  They are instructed not to drive or perform any heavy lifting until returning to see the physician in his office.    Discharge Diagnosis:  Ischemic leg [459.9] Cellulitis of left lower extremity [682.6]  Secondary Diagnosis: Patient Active Problem List  Diagnosis  . Malignant neoplasm of skin of lip  . DYSLIPIDEMIA  . ANXIETY  . TOBACCO ABUSE  . HYPERTENSION, ESSENTIAL NOS  . PERIPHERAL VASCULAR DISEASE  . OTHER DENTAL CARIES  . BACK PAIN  . THORACIC KYPHOSIS  . CHEST PAIN  . MECH COMPLICATION DUE CORONARY BYPASS GRAFT  . GLUCOSE INTOLERANCE, HX OF  . OTHER MALIGNANT NEOPLASM OF SKIN OF LIP  . Preventative health care  . Atherosclerosis  of native arteries of the extremities with intermittent claudication  . Acute gingivitis  . Abnormality of gait   Past Medical History  Diagnosis Date  . Tobacco abuse   . 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 Manson Passey).  Pt will likely require a wide resection and  has been refered to ENT (2/12)  . Dental caries   . Glucose intolerance (impaired glucose tolerance)     Max random CBG 130 (08/29/07) typically about 100. A1C in 11/08 was 5.4.  . Peripheral vascular disease   . Hypertension, essential   . Dyslipidemia   .  Anxiety   . Thoracic kyphosis   . Back pain      The patient has chronic lumbar/thoracic back pain, he has kyphosis, mild facet hypertrophy at L1-L2, circumferential annular bulging and mild vertebral body osteophytic formation at L2-L3, mild to moderate stenosis of the medial aspect of the neural foramen at L4-L5, and mild annular bulging at L5-S1.  Marland Kitchen History of alcohol abuse   . Diabetes mellitus   . Hyperlipidemia   . Peripheral arterial disease   . Leg pain   . Back pain   . Ulcer        Medication List    TAKE these medications       aspirin 81 MG chewable tablet  Chew 81 mg by mouth daily.     B COMPLEX FORMULA 1 PO  Take 1 tablet by mouth daily.     clonazePAM 1 MG tablet  Commonly known as:  KLONOPIN  Take 1 mg by mouth 3 (three) times daily as needed for anxiety.     enalapril 20 MG tablet  Commonly known as:  VASOTEC  Take 1 tablet (20 mg total) by mouth daily.     hydrochlorothiazide 25 MG tablet  Commonly known as:  HYDRODIURIL  Take 1 tablet (25 mg total) by mouth daily.     HYDROcodone-acetaminophen 10-325 MG per tablet  Commonly known as:  NORCO  Take 1 tablet by mouth every 4 (four) hours as needed.     metoprolol 50 MG tablet  Commonly known as:  LOPRESSOR  Take 50 mg by mouth 2 (two) times daily.     multivitamin with minerals tablet  Take 1 tablet by mouth daily.     omeprazole 20 MG capsule  Commonly known as:  PRILOSEC  Take 1 capsule (20 mg total) by mouth daily.     sertraline 100 MG tablet  Commonly known as:  ZOLOFT  Take 1 tablet (100 mg total) by mouth daily.     simvastatin 40 MG tablet  Commonly known as:  ZOCOR  Take 1 tablet (40 mg total) by mouth at bedtime.     traMADol 50 MG tablet  Commonly known as:  ULTRAM  Take 50 mg by mouth every 6 (six) hours as needed for pain.     varenicline 1 MG tablet  Commonly known as:  CHANTIX  Take 1 mg by mouth 2 (two) times daily.        No pain medication Rx is given as pt is  on Hydrocodone and tramadol and has refills at his pharmacy.  Disposition: home  Patient's condition: is Good  Follow up: 1. Dr. Arbie Cookey in 2 weeks   Doreatha Massed, PA-C [facility] [phone] 11/12/2012  8:14 AM

## 2012-11-12 NOTE — Care Management Note (Unsigned)
    Page 1 of 1   11/12/2012     4:32:05 PM   CARE MANAGEMENT NOTE 11/12/2012  Patient:  MARQUET, FAIRCLOTH   Account Number:  [id number]  Date Initiated:  11/12/2012  Documentation initiated by:  Keisuke Hollabaugh  Subjective/Objective Assessment:   PT S/P LT THROMBECTOMY AND FEM POP REVISION.  PTA, PT INDEPENDENT, AND LIVES WITH HIS BROTHER.     Action/Plan:   MET WITH PT TO DISCUSS DC PLANS.  PT POLITELY DECLINED HHPT SERVICES.  HE STATES THAT HE HAD THIS BEFORE AND KNOWS WHAT TO DO.  HE HAS ALL NEEDED DME AT HOME.   Anticipated DC Date:  11/13/2012   Anticipated DC Plan:  HOME/SELF CARE      DC Planning Services  CM consult      [facility] Choice  HOME HEALTH   Choice offered to / List presented to:          [facility] arranged  HH - 11 Patient Refused      Status of service:  In process, will continue to follow Medicare Important Message given?   (If response is "NO", the following Medicare IM given date fields will be blank) Date Medicare IM given:   Date Additional Medicare IM given:    Discharge Disposition:    Per UR Regulation:  Reviewed for med. necessity/level of care/duration of stay  If discussed at Long Length of Stay Meetings, dates discussed:    Comments:

## 2012-11-12 NOTE — Progress Notes (Signed)
Occupational Therapy Evaluation Patient Details Name: Andrew [name] MRN: [id number] DOB: 27-May-1956 Today's Date: 11/12/2012 Time: 7829-5621 OT Time Calculation (min): 26 min  OT Assessment / Plan / Recommendation Clinical Impression  Pt admitted for L thrombectomy and BPG revision.  Performing ADL at mod I level with RW.  Has a standard walker at home, needs a RW.  No further OT needs.    OT Assessment  Patient does not need any further OT services    Follow Up Recommendations  No OT follow up;Supervision - Intermittent    Barriers to Discharge      Equipment Recommendations   (rolling walker)    Recommendations for Other Services    Frequency       Precautions / Restrictions Precautions Precautions: Fall Restrictions Weight Bearing Restrictions: No   Pertinent Vitals/Pain 8/10, L LE, RN made aware    ADL  Eating/Feeding: Independent Where Assessed - Eating/Feeding: Chair Grooming: Wash/dry hands;Modified independent Where Assessed - Grooming: Unsupported standing Upper Body Bathing: Modified independent Where Assessed - Upper Body Bathing: Unsupported sitting Lower Body Bathing: Modified independent Where Assessed - Lower Body Bathing: Unsupported standing;Unsupported sitting Upper Body Dressing: Modified independent Where Assessed - Upper Body Dressing: Unsupported sitting Lower Body Dressing: Modified independent Where Assessed - Lower Body Dressing: Unsupported sitting;Supported sit to stand Toilet Transfer: Modified independent Toilet Transfer Method: Sit to Barista:  (to recliner) Toileting - Clothing Manipulation and Hygiene: Modified independent Where Assessed - Toileting Clothing Manipulation and Hygiene: Sit to stand from 3-in-1 or toilet Equipment Used: Rolling walker Transfers/Ambulation Related to ADLs: mod I with RW.  Pt reports ambulating in hall alone. ADL Comments: Pt readily able to access feet for ADL.  Pt is somewhat  unsteady on his feet requiring RW.    OT Diagnosis:    OT Problem List:   OT Treatment Interventions:     OT Goals    Visit Information  Last OT Received On: 11/12/12 Assistance Needed: +1    Subjective Data  Subjective: "I live in a room in the back of my brother's house." Patient Stated Goal: Home independently.   Prior Functioning     Home Living Lives With: Alone (Brother and his family live in front part of home.) Available Help at Discharge: Family;Available PRN/intermittently;Other (Comment) Type of Home: House Home Access: Stairs to enter [employer] of Steps: 3 Entrance Stairs-Rails: None Home Layout: One level Bathroom Shower/Tub: Engineer, manufacturing systems: Standard Home Adaptive Equipment: Tub transfer bench;Walker - standard;Bedside commode/3-in-1 Prior Function Level of Independence: Independent with assistive device(s) Able to Take Stairs?: Yes Driving: No Communication Communication: No difficulties Dominant Hand: Right         Vision/Perception Vision - History Baseline Vision: Wears glasses all the time   Cognition  Cognition Overall Cognitive Status: Appears within functional limits for tasks assessed/performed Arousal/Alertness: Awake/alert Orientation Level: Appears intact for tasks assessed Behavior During Session: [facility] for tasks performed    Extremity/Trunk Assessment Right Upper Extremity Assessment RUE ROM/Strength/Tone: Within functional levels RUE Coordination: WFL - gross/fine motor Left Upper Extremity Assessment LUE ROM/Strength/Tone: Within functional levels LUE Coordination: WFL - gross/fine motor Trunk Assessment Trunk Assessment: Normal     Mobility Bed Mobility Bed Mobility: Supine to Sit;Sitting - Scoot to Edge of Bed Supine to Sit: 7: Independent Sitting - Scoot to Edge of Bed: 7: Independent Transfers Transfers: Sit to Stand;Stand to Sit Sit to Stand: 6: Modified independent (Device/Increase  time);With upper extremity assist;From bed Stand to Sit:  6: Modified independent (Device/Increase time);With upper extremity assist;To chair/3-in-1     Exercise     Balance     End of Session OT - End of Session Activity Tolerance: Patient tolerated treatment well Patient left: in chair;with call bell/phone within reach  GO     Evern Bio 11/12/2012, 12:06 PM [phone]

## 2012-11-12 NOTE — Progress Notes (Signed)
Utilization Review Completed.Andrew [name] T4/02/2013

## 2012-11-12 NOTE — Progress Notes (Signed)
Physical Therapy Treatment Patient Details Name: Andrew [name] MRN: [id number] DOB: 1956-06-20 Today's Date: 11/12/2012 Time: 4098-1191 PT Time Calculation (min): 24 min  PT Assessment / Plan / Recommendation Comments on Treatment Session  Pt s/p left thrombectomy and revision of fem/peroneal BPG.  Pt met all PT goals and does not need any further PT.  Do not feel pt needs PT at home either.  Pt feels he is back to baseline.  Will d/c PT.  Pt would like to obtain RW.  Thanks.    Follow Up Recommendations  No PT follow up                 Equipment Recommendations  Rolling walker with 5" wheels             Plan Discharge plan needs to be updated    Precautions / Restrictions Precautions Precautions: None Restrictions Weight Bearing Restrictions: No   Pertinent Vitals/Pain VSS, No pain    Mobility  Bed Mobility Bed Mobility: Supine to Sit;Sitting - Scoot to Edge of Bed Rolling Left: Not tested (comment) Left Sidelying to Sit: Not tested (comment) Supine to Sit: 7: Independent Sitting - Scoot to Edge of Bed: 7: Independent Transfers Transfers: Sit to Stand;Stand to Sit Sit to Stand: 6: Modified independent (Device/Increase time);With upper extremity assist;From bed Stand to Sit: 6: Modified independent (Device/Increase time);With upper extremity assist;To chair/3-in-1 Ambulation/Gait Ambulation/Gait Assistance: 6: Modified independent (Device/Increase time) Ambulation Distance (Feet): 450 Feet Assistive device: Rolling walker Ambulation/Gait Assistance Details: No LOB with RW.  Pt can withstand challenges to balance.   Gait Pattern: Step-through pattern;Decreased stride length Gait velocity: decreased Stairs: No (declined practice) Wheelchair Mobility Wheelchair Mobility: No    Exercises General Exercises - Lower Extremity Heel Slides: AROM;Both;10 reps;Standing Hip Flexion/Marching: AROM;Both;10 reps;Standing    PT Goals Acute Rehab PT Goals PT Goal: Ambulate  - Progress: Met PT Goal: Up/Down Stairs - Progress: Not met (pt declined need for practice)  Visit Information  Last PT Received On: 11/12/12 Assistance Needed: +1    Subjective Data  Subjective: "I feel pretty good."   Cognition  Cognition Overall Cognitive Status: Appears within functional limits for tasks assessed/performed Arousal/Alertness: Awake/alert Orientation Level: Appears intact for tasks assessed Behavior During Session: [facility] for tasks performed    Balance  Static Standing Balance Static Standing - Balance Support: Bilateral upper extremity supported;During functional activity Static Standing - Level of Assistance: 6: Modified independent (Device/Increase time) Static Standing - Comment/# of Minutes: 3 minutes at sink brushing teeth  End of Session PT - End of Session Equipment Utilized During Treatment: Gait belt Activity Tolerance: Patient tolerated treatment well Patient left: in chair;with call bell/phone within reach Nurse Communication: Mobility status       [name],Dorina Ribaudo 11/12/2012, 12:41 PM [facility] Acute Rehabilitation [phone] (pager)

## 2012-11-12 NOTE — Progress Notes (Signed)
VASCULAR LAB PRELIMINARY  ARTERIAL  ABI completed:    RIGHT    LEFT    PRESSURE WAVEFORM  PRESSURE WAVEFORM  BRACHIAL 138 T BRACHIAL 130 T  DP   DP    AT 56 DM AT 98 DM  PT 72 DM PT 93 DM  PER   PER    GREAT TOE  NA GREAT TOE  NA    RIGHT LEFT  ABI 0.52 0.71     Wayman Hoard, RVT 11/12/2012, 9:20 AM

## 2012-11-12 NOTE — Progress Notes (Addendum)
[facility] Progress Note  11/12/2012 7:49 AM 2 Days Post-Op   Subjective:  Still c/o soreness  Afebrile x 24 hrs VSS 100% RA  Filed Vitals:   11/12/12 0604  BP: 120/81  Pulse: 89  Temp: 98.8 F (37.1 C)  Resp: 16    Physical Exam: Incisions:  C/d/i with steri strips in place Extremities:  Left foot is warm; 2nd and 5th toes continue to be dusky  CBC    Component Value Date/Time   WBC 9.8 11/11/2012 0505   RBC 3.71* 11/11/2012 0505   HGB 10.7* 11/11/2012 0505   HCT 31.7* 11/11/2012 0505   PLT 294 11/11/2012 0505   MCV 85.4 11/11/2012 0505   MCH 28.8 11/11/2012 0505   MCHC 33.8 11/11/2012 0505   RDW 14.0 11/11/2012 0505   LYMPHSABS 1.4 11/09/2012 1943   MONOABS 0.8 11/09/2012 1943   EOSABS 0.2 11/09/2012 1943   BASOSABS 0.0 11/09/2012 1943    BMET    Component Value Date/Time   NA 136 11/11/2012 0505   K 3.9 11/11/2012 0505   CL 99 11/11/2012 0505   CO2 29 11/11/2012 0505   GLUCOSE 127* 11/11/2012 0505   BUN 4* 11/11/2012 0505   CREATININE 0.58 11/11/2012 0505   CALCIUM 9.3 11/11/2012 0505   GFRNONAA >90 11/11/2012 0505   GFRAA >90 11/11/2012 0505    INR    Component Value Date/Time   INR 0.88 11/09/2012 1949     Intake/Output Summary (Last 24 hours) at 11/12/12 0749 Last data filed at 11/11/12 2300  Gross per 24 hour  Intake    240 ml  Output   4150 ml  Net  -3910 ml     Assessment/Plan:  57 y.o. male is s/p:  Thrombectomy of left femoral to peroneal bypass and ReVision with vein patch angioplasty   2 Days Post-Op   -had conversation with pt about the importance of smoking cessation. -continue PT/mobilization -DVT prophylaxis: lovenox -will order home health PT   Doreatha Massed, PA-C [facility] [phone] 11/12/2012 7:49 AM    Foot well-perfused. Palpable graft pulse at the level of the knee. Wounds healing. Plan discharge in a.m.

## 2012-11-12 NOTE — Progress Notes (Signed)
Pt ambulated 300 ft independently with rolling walker. Back to chair after ambulation. Andrew [name]

## 2012-11-13 ENCOUNTER — Telehealth: Payer: Self-pay | Admitting: Vascular Surgery

## 2012-11-13 ENCOUNTER — Encounter: Payer: Self-pay | Admitting: Internal Medicine

## 2012-11-13 MED ORDER — OXYCODONE-ACETAMINOPHEN 5-325 MG PO TABS: 1.0000 | ORAL_TABLET | ORAL | Status: DC | PRN

## 2012-11-13 NOTE — Progress Notes (Signed)
After explaining d/c instructions to patient, patient ambulated to bathroom. Upon return to bed we noticed a continuous blood trickle from left leg incision. ABD dressing applied to monitor. Spoke to [employer] Kara Mead) Thomasena Edis; pa. Orders given to continue with discharge home and instruct patient to change dressing daily.  Patient is to return to hospital if bleeding increases. Instructions given to patient; verbalized understanding. Awaiting family for d/c home. Andrew [name]

## 2012-11-13 NOTE — Telephone Encounter (Signed)
Spoke with pt, gave appt info - kf °

## 2012-11-13 NOTE — Progress Notes (Signed)
[facility] OF [place]  Progress Note Bypass Surgery  Date of Surgery: 11/09/2012 - 11/10/2012  Procedure(s): Thrombectomy and Revision Left FEMORAL-PERONEAL Bypass Graft Surgeon: Surgeon(s): Larina Earthly, MD  3 Days Post-Op  History of Present Illness  OFFIE WAIDE is a 57 y.o. male who is S/P Procedure(s): Thrombectomy and Revision Left FEMORAL-PERONEAL Bypass Graft left.  The patient's pre-op symptoms of pain are Improved . Patients pain is well controlled.    VASC. LAB Studies:        ABI: Right 0.52 ;  Left 0.71;   Imaging: No results found.  Significant Diagnostic Studies: CBC Lab Results  Component Value Date   WBC 9.8 11/11/2012   HGB 10.7* 11/11/2012   HCT 31.7* 11/11/2012   MCV 85.4 11/11/2012   PLT 294 11/11/2012    BMET     Component Value Date/Time   NA 136 11/11/2012 0505   K 3.9 11/11/2012 0505   CL 99 11/11/2012 0505   CO2 29 11/11/2012 0505   GLUCOSE 127* 11/11/2012 0505   BUN 4* 11/11/2012 0505   CREATININE 0.58 11/11/2012 0505   CALCIUM 9.3 11/11/2012 0505   GFRNONAA >90 11/11/2012 0505   GFRAA >90 11/11/2012 0505    COAG Lab Results  Component Value Date   INR 0.88 11/09/2012   INR 1.00 06/11/2012   INR 1.02 05/09/2011   No results found for this basename: PTT    Physical Examination  BP Readings from Last 3 Encounters:  11/13/12 121/80  11/13/12 121/80  10/17/12 152/90   Temp Readings from Last 3 Encounters:  11/13/12 97.8 F (36.6 C) Oral  11/13/12 97.8 F (36.6 C) Oral  10/17/12 97.9 F (36.6 C) Oral   SpO2 Readings from Last 3 Encounters:  11/13/12 100%  11/13/12 100%  10/17/12 95%   Pulse Readings from Last 3 Encounters:  11/13/12 83  11/13/12 83  10/17/12 84    Pt is A&O x 3 left lower extremity: Incision/s is/are clean,dry.intact, and  healing without hematoma, wound edges erythema.  No drainage drainage Limb is warm; with good color Left foot is warm; 2nd and 5th toes continue to be  dusky    Assessment/Plan: Pt. Doing well Post-op pain is controlled Wounds are healing well Left foot is warm; 2nd and 5th toes continue to be dusky D/C home new rolling walker refused PT [facility]   Thomasena Edis, EMMA MAUREEN [phone] 11/13/2012 7:58 AM

## 2012-11-13 NOTE — Telephone Encounter (Signed)
Message copied by Margaretmary Eddy on Tue Nov 13, 2012  4:37 PM ------      Message from: Sharee Pimple      Created: Tue Nov 13, 2012  1:50 PM      Regarding: schedule                   ----- Message -----         From: Dara Lords, PA-C         Sent: 11/12/2012   8:13 AM           To: Sharee Pimple, CMA            S/p Thrombectomy of left femoral to peroneal bypass and ReVision with vein patch angioplasty on 11/10/12 by TFE.  F/u with him in 2 weeks.            Thanks,      Lelon Mast       ------

## 2012-11-13 NOTE — Progress Notes (Signed)
Left leg incision is intact and  well approximated. Andrew [name]

## 2012-11-13 NOTE — Progress Notes (Signed)
Left leg dressing dry/intact. Patient transported via wheelchair to main lobby per Psychiatrist. Belongings with patient. Andrew [name]

## 2012-11-14 ENCOUNTER — Ambulatory Visit: Payer: Medicare Other | Admitting: Vascular Surgery

## 2012-11-21 ENCOUNTER — Encounter: Payer: Medicare Other | Admitting: Internal Medicine

## 2012-11-26 ENCOUNTER — Encounter: Payer: Self-pay | Admitting: Vascular Surgery

## 2012-11-27 ENCOUNTER — Ambulatory Visit (INDEPENDENT_AMBULATORY_CARE_PROVIDER_SITE_OTHER): Payer: Medicare Other | Admitting: Vascular Surgery

## 2012-11-27 ENCOUNTER — Encounter: Payer: Self-pay | Admitting: Vascular Surgery

## 2012-11-27 VITALS — BP 126/76 | HR 71 | Resp 18 | Ht 72.0 in | Wt 151.7 lb

## 2012-11-27 DIAGNOSIS — I739 Peripheral vascular disease, unspecified: Secondary | ICD-10-CM

## 2012-11-27 NOTE — Progress Notes (Signed)
The patient has today from recent thrombectomy of a femoral to peroneal bypass on 11/10/2012. The patient had a remote history of femoroperoneal bypass in 2003 with Dr. Madilyn Fireman. He had a new right femoral to peroneal bypass by Dr. Edilia Bo November 2013. This was Gore-Tex saphenous vein composite. He presented with several week history of profound ischemia. He was taken to the operating room by myself and underwent thrombectomy of his bypass. This is into a very small peroneal artery which had been seen in his preoperative arteriogram in November 2013. He had vein harvested from his right ankle for a vein patch angioplasty the distal anastomosis. He did have an necrotic changes of the tips of several toes of his left foot.  Today he actually looks good. He does have a palpable graft pulse. His foot is well-perfused. His vein harvest incision is completely healed. He does have some separation in the calf incision from his distal bypass incision. He also has a necrosis of the tips of the second and fifth toe. If it looks as though will be quite small and will demarcate he may require dictation of the tip of his second toe.  He'll be seen again in 2 weeks for a followup. He is requesting narcotic pain medication was given Percocet 5/325 #30 no refills.  I feel that he is at high risk for recurrent occlusion of his bypass is a quite tenuous with Gore-Tex saphenous composite to his distal peroneal which is a small diseased artery. If he has read Ayven Pheasant reocclusion feel that his best option may be primary amputation of this. We'll see him again in 2 weeks

## 2012-11-30 ENCOUNTER — Telehealth: Payer: Self-pay | Admitting: *Deleted

## 2012-11-30 NOTE — Telephone Encounter (Signed)
Andrew [name] called to ask if Dr Arbie Cookey could go ahead and amputate his Left 2nd toe because it is purple;painful and beginning to smell bad. Dr Arbie Cookey had told him it would probably fall off but he feels like he just wants it off because of pain and smell. There is a little drainage but not any more than what it was doing Tuesday at his appt.I told him Dr Arbie Cookey is out and I would talk to him MondaY.  I talked with Dr Arbie Cookey and since he is away he suggested seeing if Andrew Groesbeck was OK with Dr Edilia Bo doing the surgery. I have left 2 messages this afternoon for Andrew Taul to call me.

## 2012-12-03 ENCOUNTER — Other Ambulatory Visit: Payer: Self-pay | Admitting: *Deleted

## 2012-12-03 MED ORDER — CLONAZEPAM 1 MG PO TABS: 1.0000 mg | ORAL_TABLET | Freq: Three times a day (TID) | ORAL | Status: DC | PRN

## 2012-12-03 NOTE — Telephone Encounter (Signed)
The patient has a medication contract for clonazepam.  I called his pharmacy, and confirmed he has been filling this prescription as prescribed, and that he has no further refills.  This medication was last filled 30 days ago.  I phoned in a prescription of this medication to the patient's pharmacy.  In terms of his "toe rotting off", he is being seen by vascular surgery for PVD, with recent thrombectomy.  He is set to follow-up with them in 2 weeks, but if he has further concerns before that time we'd be happy to see him anytime.  Awanda Mink 12/03/2012, 6:49 PM

## 2012-12-03 NOTE — Telephone Encounter (Signed)
Pt calls and states he has a toe that is rotting off and he needs a refill on clonazepam to help his toe, he states "but if you don't fill it, it's about par for the course" ???. Please advise

## 2012-12-04 ENCOUNTER — Other Ambulatory Visit: Payer: Self-pay | Admitting: *Deleted

## 2012-12-04 ENCOUNTER — Encounter (HOSPITAL_COMMUNITY): Payer: Self-pay

## 2012-12-04 NOTE — Telephone Encounter (Signed)
I talked to Andrew [name] today and scheduled him with Dr Edilia Bo for Thursday 12/06/12 for amputation Left 2nd toe/jjk

## 2012-12-05 ENCOUNTER — Encounter (HOSPITAL_COMMUNITY): Payer: Self-pay | Admitting: *Deleted

## 2012-12-05 MED ORDER — DEXTROSE 5 % IV SOLN
1.5000 g | INTRAVENOUS | Status: AC
  Administered 2012-12-06: 1.5 g via INTRAVENOUS
  Filled 2012-12-05: qty 1.5

## 2012-12-06 ENCOUNTER — Encounter (HOSPITAL_COMMUNITY)
Admission: RE | Disposition: A | Discharge status: Rehabilitation Facility | Payer: Self-pay | Source: Ambulatory Visit | Attending: Vascular Surgery

## 2012-12-06 ENCOUNTER — Ambulatory Visit (HOSPITAL_COMMUNITY): Payer: Medicare Other | Admitting: Anesthesiology

## 2012-12-06 ENCOUNTER — Inpatient Hospital Stay (HOSPITAL_COMMUNITY)
Admission: RE | Admit: 2012-12-06 | Discharge: 2012-12-13 | DRG: 240 | Disposition: A | Discharge status: Rehabilitation Facility | Payer: Medicare Other | Source: Ambulatory Visit | Attending: Vascular Surgery | Admitting: Vascular Surgery

## 2012-12-06 ENCOUNTER — Encounter (HOSPITAL_COMMUNITY): Payer: Self-pay | Admitting: Anesthesiology

## 2012-12-06 ENCOUNTER — Encounter (HOSPITAL_COMMUNITY): Payer: Self-pay | Admitting: *Deleted

## 2012-12-06 DIAGNOSIS — E785 Hyperlipidemia, unspecified: Secondary | ICD-10-CM | POA: Diagnosis present

## 2012-12-06 DIAGNOSIS — Z7982 Long term (current) use of aspirin: Secondary | ICD-10-CM

## 2012-12-06 DIAGNOSIS — K219 Gastro-esophageal reflux disease without esophagitis: Secondary | ICD-10-CM | POA: Diagnosis present

## 2012-12-06 DIAGNOSIS — E049 Nontoxic goiter, unspecified: Secondary | ICD-10-CM | POA: Diagnosis present

## 2012-12-06 DIAGNOSIS — L02619 Cutaneous abscess of unspecified foot: Secondary | ICD-10-CM | POA: Diagnosis present

## 2012-12-06 DIAGNOSIS — Z79899 Other long term (current) drug therapy: Secondary | ICD-10-CM

## 2012-12-06 DIAGNOSIS — L03119 Cellulitis of unspecified part of limb: Secondary | ICD-10-CM | POA: Diagnosis present

## 2012-12-06 DIAGNOSIS — I70269 Atherosclerosis of native arteries of extremities with gangrene, unspecified extremity: Secondary | ICD-10-CM

## 2012-12-06 DIAGNOSIS — M4000 Postural kyphosis, site unspecified: Secondary | ICD-10-CM | POA: Diagnosis present

## 2012-12-06 DIAGNOSIS — I739 Peripheral vascular disease, unspecified: Secondary | ICD-10-CM

## 2012-12-06 DIAGNOSIS — C002 Malignant neoplasm of external lip, unspecified: Secondary | ICD-10-CM | POA: Diagnosis present

## 2012-12-06 DIAGNOSIS — I10 Essential (primary) hypertension: Secondary | ICD-10-CM | POA: Diagnosis present

## 2012-12-06 DIAGNOSIS — Y832 Surgical operation with anastomosis, bypass or graft as the cause of abnormal reaction of the patient, or of later complication, without mention of misadventure at the time of the procedure: Secondary | ICD-10-CM | POA: Diagnosis present

## 2012-12-06 DIAGNOSIS — T82898A Other specified complication of vascular prosthetic devices, implants and grafts, initial encounter: Secondary | ICD-10-CM | POA: Diagnosis present

## 2012-12-06 DIAGNOSIS — F17200 Nicotine dependence, unspecified, uncomplicated: Secondary | ICD-10-CM | POA: Diagnosis present

## 2012-12-06 DIAGNOSIS — R7309 Other abnormal glucose: Secondary | ICD-10-CM | POA: Diagnosis present

## 2012-12-06 DIAGNOSIS — E871 Hypo-osmolality and hyponatremia: Secondary | ICD-10-CM | POA: Diagnosis not present

## 2012-12-06 DIAGNOSIS — F411 Generalized anxiety disorder: Secondary | ICD-10-CM | POA: Diagnosis present

## 2012-12-06 DIAGNOSIS — K029 Dental caries, unspecified: Secondary | ICD-10-CM | POA: Diagnosis present

## 2012-12-06 HISTORY — PX: AMPUTATION: SHX166

## 2012-12-06 HISTORY — DX: Gastro-esophageal reflux disease without esophagitis: K21.9

## 2012-12-06 LAB — POCT I-STAT, CHEM 8
BUN: 8 mg/dL (ref 6–23)
Chloride: 103 mEq/L (ref 96–112)
HCT: 40 % (ref 39.0–52.0)
Potassium: 3.6 mEq/L (ref 3.5–5.1)
Sodium: 139 mEq/L (ref 135–145)

## 2012-12-06 LAB — COMPREHENSIVE METABOLIC PANEL
ALT: 5 U/L (ref 0–53)
Alkaline Phosphatase: 78 U/L (ref 39–117)
CO2: 26 mEq/L (ref 19–32)
Calcium: 9.4 mg/dL (ref 8.4–10.5)
Chloride: 103 mEq/L (ref 96–112)
GFR calc Af Amer: >90 mL/min (ref >90)
GFR calc non Af Amer: >90 mL/min (ref >90)
Glucose, Bld: 89 mg/dL (ref 70–99)
Sodium: 138 mEq/L (ref 135–145)
Total Bilirubin: 0.2 mg/dL — ABNORMAL LOW (ref 0.3–1.2)

## 2012-12-06 LAB — CBC
Hemoglobin: 11.9 g/dL — ABNORMAL LOW (ref 13.0–17.0)
MCH: 28.7 pg (ref 26.0–34.0)
RBC: 4.15 MIL/uL — ABNORMAL LOW (ref 4.22–5.81)
WBC: 7.5 10*3/uL (ref 4.0–10.5)

## 2012-12-06 LAB — GLUCOSE, CAPILLARY: Glucose-Capillary: 89 mg/dL (ref 70–99)

## 2012-12-06 SURGERY — AMPUTATION DIGIT
Anesthesia: Monitor Anesthesia Care | Site: Toe | Laterality: Left | Wound class: Dirty or Infected

## 2012-12-06 MED ORDER — HYDROCODONE-ACETAMINOPHEN 10-325 MG PO TABS
1.0000 | ORAL_TABLET | ORAL | Status: DC | PRN
  Administered 2012-12-07: 2 via ORAL
  Administered 2012-12-08: 1 via ORAL
  Filled 2012-12-06: qty 1
  Filled 2012-12-06: qty 2

## 2012-12-06 MED ORDER — CEPHALEXIN 500 MG PO CAPS
500.0000 mg | ORAL_CAPSULE | Freq: Three times a day (TID) | ORAL | Status: DC
  Administered 2012-12-06 – 2012-12-13 (×22): 500 mg via ORAL
  Filled 2012-12-06 (×24): qty 1

## 2012-12-06 MED ORDER — ONDANSETRON HCL 4 MG/2ML IJ SOLN: 4.0000 mg | Freq: Four times a day (QID) | INTRAMUSCULAR | Status: DC | PRN

## 2012-12-06 MED ORDER — FENTANYL CITRATE 0.05 MG/ML IJ SOLN: 50.0000 ug | INTRAMUSCULAR | Status: DC | PRN

## 2012-12-06 MED ORDER — PHENOL 1.4 % MT LIQD
1.0000 | OROMUCOSAL | Status: DC | PRN
Start: 2012-12-06 — End: 2012-12-13
  Administered 2012-12-08: 1 via OROMUCOSAL
  Filled 2012-12-06: qty 177

## 2012-12-06 MED ORDER — LACTATED RINGERS IV SOLN
INTRAVENOUS | Status: DC | PRN
  Administered 2012-12-06: 13:00:00 via INTRAVENOUS

## 2012-12-06 MED ORDER — BACITRACIN ZINC 500 UNIT/GM EX OINT
TOPICAL_OINTMENT | CUTANEOUS | Status: DC | PRN
  Administered 2012-12-06: 1 via TOPICAL

## 2012-12-06 MED ORDER — GUAIFENESIN-DM 100-10 MG/5ML PO SYRP: 15.0000 mL | ORAL_SOLUTION | ORAL | Status: DC | PRN

## 2012-12-06 MED ORDER — SIMVASTATIN 40 MG PO TABS
40.0000 mg | ORAL_TABLET | Freq: Every day | ORAL | Status: DC
  Administered 2012-12-06 – 2012-12-12 (×7): 40 mg via ORAL
  Filled 2012-12-06 (×8): qty 1

## 2012-12-06 MED ORDER — OXYCODONE-ACETAMINOPHEN 5-325 MG PO TABS
2.0000 | ORAL_TABLET | ORAL | Status: DC | PRN
  Administered 2012-12-06: 2 via ORAL
  Administered 2012-12-06: 1 via ORAL
  Administered 2012-12-07 – 2012-12-13 (×24): 2 via ORAL
  Filled 2012-12-06 (×11): qty 2
  Filled 2012-12-06: qty 1
  Filled 2012-12-06 (×14): qty 2

## 2012-12-06 MED ORDER — HYDROMORPHONE HCL PF 1 MG/ML IJ SOLN
INTRAMUSCULAR | Status: AC
Start: 2012-12-06 — End: 2012-12-07
  Filled 2012-12-06: qty 1

## 2012-12-06 MED ORDER — ACETAMINOPHEN 325 MG RE SUPP
325.0000 mg | RECTAL | Status: DC | PRN
  Filled 2012-12-06: qty 2

## 2012-12-06 MED ORDER — ENOXAPARIN SODIUM 40 MG/0.4ML ~~LOC~~ SOLN
40.0000 mg | SUBCUTANEOUS | Status: DC
  Filled 2012-12-06: qty 0.4

## 2012-12-06 MED ORDER — ALUM & MAG HYDROXIDE-SIMETH 200-200-20 MG/5ML PO SUSP: 15.0000 mL | ORAL | Status: DC | PRN

## 2012-12-06 MED ORDER — FENTANYL CITRATE 0.05 MG/ML IJ SOLN
INTRAMUSCULAR | Status: DC | PRN
  Administered 2012-12-06: 50 ug via INTRAVENOUS

## 2012-12-06 MED ORDER — SODIUM CHLORIDE 0.9 % IV SOLN: 250.0000 mL | INTRAVENOUS | Status: DC | PRN

## 2012-12-06 MED ORDER — LACTATED RINGERS IV SOLN
INTRAVENOUS | Status: DC
  Administered 2012-12-06: 12:00:00 via INTRAVENOUS

## 2012-12-06 MED ORDER — OXYCODONE-ACETAMINOPHEN 5-325 MG PO TABS
ORAL_TABLET | ORAL | Status: AC
  Administered 2012-12-06: 2
  Filled 2012-12-06: qty 2

## 2012-12-06 MED ORDER — ENOXAPARIN SODIUM 40 MG/0.4ML ~~LOC~~ SOLN
40.0000 mg | SUBCUTANEOUS | Status: DC
  Administered 2012-12-06 – 2012-12-09 (×4): 40 mg via SUBCUTANEOUS
  Filled 2012-12-06 (×5): qty 0.4

## 2012-12-06 MED ORDER — HYDROCHLOROTHIAZIDE 25 MG PO TABS
25.0000 mg | ORAL_TABLET | Freq: Every day | ORAL | Status: DC
  Administered 2012-12-06 – 2012-12-13 (×8): 25 mg via ORAL
  Filled 2012-12-06 (×8): qty 1

## 2012-12-06 MED ORDER — PANTOPRAZOLE SODIUM 40 MG PO TBEC
40.0000 mg | DELAYED_RELEASE_TABLET | Freq: Every day | ORAL | Status: DC
  Administered 2012-12-06 – 2012-12-13 (×8): 40 mg via ORAL
  Filled 2012-12-06 (×8): qty 1

## 2012-12-06 MED ORDER — SODIUM CHLORIDE 0.9 % IV SOLN: INTRAVENOUS | Status: DC

## 2012-12-06 MED ORDER — POTASSIUM CHLORIDE CRYS ER 20 MEQ PO TBCR
20.0000 meq | EXTENDED_RELEASE_TABLET | Freq: Once | ORAL | Status: AC
  Administered 2012-12-06: 20 meq via ORAL
  Filled 2012-12-06: qty 1

## 2012-12-06 MED ORDER — CLONAZEPAM 1 MG PO TABS: 1.0000 mg | ORAL_TABLET | Freq: Three times a day (TID) | ORAL | Status: DC | PRN

## 2012-12-06 MED ORDER — ONDANSETRON HCL 4 MG/2ML IJ SOLN: 4.0000 mg | Freq: Once | INTRAMUSCULAR | Status: DC | PRN

## 2012-12-06 MED ORDER — TRAMADOL HCL 50 MG PO TABS: 50.0000 mg | ORAL_TABLET | Freq: Four times a day (QID) | ORAL | Status: DC | PRN

## 2012-12-06 MED ORDER — ENALAPRIL MALEATE 20 MG PO TABS
20.0000 mg | ORAL_TABLET | Freq: Every day | ORAL | Status: DC
  Administered 2012-12-07 – 2012-12-13 (×7): 20 mg via ORAL
  Filled 2012-12-06 (×7): qty 1

## 2012-12-06 MED ORDER — MULTI-VITAMIN/MINERALS PO TABS: 1.0000 | ORAL_TABLET | Freq: Every day | ORAL | Status: DC

## 2012-12-06 MED ORDER — ADULT MULTIVITAMIN W/MINERALS CH
1.0000 | ORAL_TABLET | Freq: Every day | ORAL | Status: DC
  Administered 2012-12-06 – 2012-12-13 (×8): 1 via ORAL
  Filled 2012-12-06 (×8): qty 1

## 2012-12-06 MED ORDER — PROPOFOL INFUSION 10 MG/ML OPTIME
INTRAVENOUS | Status: DC | PRN
  Administered 2012-12-06: 50 ug/kg/min via INTRAVENOUS

## 2012-12-06 MED ORDER — ASPIRIN EC 81 MG PO TBEC
81.0000 mg | DELAYED_RELEASE_TABLET | Freq: Every day | ORAL | Status: DC
  Administered 2012-12-06 – 2012-12-13 (×8): 81 mg via ORAL
  Filled 2012-12-06 (×8): qty 1

## 2012-12-06 MED ORDER — HYDRALAZINE HCL 20 MG/ML IJ SOLN: 10.0000 mg | INTRAMUSCULAR | Status: DC | PRN

## 2012-12-06 MED ORDER — HYDROMORPHONE HCL PF 1 MG/ML IJ SOLN
INTRAMUSCULAR | Status: AC
  Filled 2012-12-06: qty 1

## 2012-12-06 MED ORDER — ONDANSETRON HCL 4 MG/2ML IJ SOLN
INTRAMUSCULAR | Status: DC | PRN
  Administered 2012-12-06: 4 mg via INTRAVENOUS

## 2012-12-06 MED ORDER — MIDAZOLAM HCL 5 MG/5ML IJ SOLN
INTRAMUSCULAR | Status: DC | PRN
  Administered 2012-12-06: 1 mg via INTRAVENOUS

## 2012-12-06 MED ORDER — ENOXAPARIN SODIUM 40 MG/0.4ML ~~LOC~~ SOLN: 40.0000 mg | SUBCUTANEOUS | Status: DC

## 2012-12-06 MED ORDER — DEXTROSE 5 % IV SOLN
INTRAVENOUS | Status: DC | PRN
  Administered 2012-12-06: 14:00:00 via INTRAVENOUS

## 2012-12-06 MED ORDER — FENTANYL CITRATE 0.05 MG/ML IJ SOLN
INTRAMUSCULAR | Status: AC
  Administered 2012-12-06: 100 ug via INTRAVENOUS
  Filled 2012-12-06: qty 2

## 2012-12-06 MED ORDER — SODIUM CHLORIDE 0.9 % IJ SOLN
3.0000 mL | Freq: Two times a day (BID) | INTRAMUSCULAR | Status: DC
  Administered 2012-12-06 – 2012-12-12 (×7): 3 mL via INTRAVENOUS

## 2012-12-06 MED ORDER — VARENICLINE TARTRATE 1 MG PO TABS
1.0000 mg | ORAL_TABLET | Freq: Two times a day (BID) | ORAL | Status: DC
  Administered 2012-12-06 – 2012-12-13 (×13): 1 mg via ORAL
  Filled 2012-12-06 (×15): qty 1

## 2012-12-06 MED ORDER — METOPROLOL TARTRATE 1 MG/ML IV SOLN: 2.0000 mg | INTRAVENOUS | Status: DC | PRN

## 2012-12-06 MED ORDER — 0.9 % SODIUM CHLORIDE (POUR BTL) OPTIME
TOPICAL | Status: DC | PRN
  Administered 2012-12-06: 1000 mL

## 2012-12-06 MED ORDER — SODIUM CHLORIDE 0.9 % IJ SOLN: 3.0000 mL | INTRAMUSCULAR | Status: DC | PRN

## 2012-12-06 MED ORDER — BACITRACIN ZINC 500 UNIT/GM EX OINT
TOPICAL_OINTMENT | CUTANEOUS | Status: AC
  Filled 2012-12-06: qty 15

## 2012-12-06 MED ORDER — HYDROMORPHONE HCL PF 1 MG/ML IJ SOLN
0.2500 mg | INTRAMUSCULAR | Status: DC | PRN
Start: 2012-12-06 — End: 2012-12-06
  Administered 2012-12-06 (×4): 0.5 mg via INTRAVENOUS

## 2012-12-06 MED ORDER — LABETALOL HCL 5 MG/ML IV SOLN
10.0000 mg | INTRAVENOUS | Status: DC | PRN
  Filled 2012-12-06: qty 4

## 2012-12-06 MED ORDER — ACETAMINOPHEN 325 MG PO TABS: 325.0000 mg | ORAL_TABLET | ORAL | Status: DC | PRN

## 2012-12-06 MED ORDER — SERTRALINE HCL 100 MG PO TABS
100.0000 mg | ORAL_TABLET | Freq: Every day | ORAL | Status: DC
  Administered 2012-12-07 – 2012-12-13 (×7): 100 mg via ORAL
  Filled 2012-12-06 (×7): qty 1

## 2012-12-06 SURGICAL SUPPLY — 36 items
BANDAGE CONFORM 3  STR LF (GAUZE/BANDAGES/DRESSINGS) ×2 IMPLANT
BANDAGE ELASTIC 4 VELCRO ST LF (GAUZE/BANDAGES/DRESSINGS) ×2 IMPLANT
BANDAGE GAUZE ELAST BULKY 4 IN (GAUZE/BANDAGES/DRESSINGS) ×2 IMPLANT
BLADE AVERAGE 25X9 (BLADE) IMPLANT
CANISTER SUCTION 2500CC (MISCELLANEOUS) ×2 IMPLANT
CLOTH BEACON ORANGE TIMEOUT ST (SAFETY) ×2 IMPLANT
COVER SURGICAL LIGHT HANDLE (MISCELLANEOUS) ×2 IMPLANT
DRAPE EXTREMITY T 121X128X90 (DRAPE) ×2 IMPLANT
DRSG ADAPTIC 3X8 NADH LF (GAUZE/BANDAGES/DRESSINGS) ×1 IMPLANT
ELECT REM PT RETURN 9FT ADLT (ELECTROSURGICAL) ×2
ELECTRODE REM PT RTRN 9FT ADLT (ELECTROSURGICAL) ×1 IMPLANT
GLOVE BIO SURGEON STRL SZ7.5 (GLOVE) ×2 IMPLANT
GLOVE BIOGEL PI IND STRL 7.5 (GLOVE) IMPLANT
GLOVE BIOGEL PI IND STRL 8 (GLOVE) ×1 IMPLANT
GLOVE BIOGEL PI INDICATOR 7.5 (GLOVE) ×2
GLOVE BIOGEL PI INDICATOR 8 (GLOVE) ×1
GLOVE SURG SS PI 7.5 STRL IVOR (GLOVE) ×1 IMPLANT
GOWN STRL NON-REIN LRG LVL3 (GOWN DISPOSABLE) ×4 IMPLANT
KIT BASIN OR (CUSTOM PROCEDURE TRAY) ×2 IMPLANT
KIT ROOM TURNOVER OR (KITS) ×2 IMPLANT
NS IRRIG 1000ML POUR BTL (IV SOLUTION) ×2 IMPLANT
PACK GENERAL/GYN (CUSTOM PROCEDURE TRAY) ×2 IMPLANT
PAD ARMBOARD 7.5X6 YLW CONV (MISCELLANEOUS) ×4 IMPLANT
SPECIMEN JAR SMALL (MISCELLANEOUS) ×2 IMPLANT
SPONGE GAUZE 4X4 12PLY (GAUZE/BANDAGES/DRESSINGS) ×2 IMPLANT
SUT ETHILON 3 0 PS 1 (SUTURE) ×2 IMPLANT
SUT SILK 3 0 (SUTURE) ×2
SUT SILK 3-0 18XBRD TIE 12 (SUTURE) IMPLANT
SUT VIC AB 3-0 SH 27 (SUTURE) ×2
SUT VIC AB 3-0 SH 27X BRD (SUTURE) IMPLANT
SWAB COLLECTION DEVICE MRSA (MISCELLANEOUS) IMPLANT
TOWEL OR 17X24 6PK STRL BLUE (TOWEL DISPOSABLE) ×2 IMPLANT
TOWEL OR 17X26 10 PK STRL BLUE (TOWEL DISPOSABLE) ×2 IMPLANT
TUBE ANAEROBIC SPECIMEN COL (MISCELLANEOUS) IMPLANT
UNDERPAD 30X30 INCONTINENT (UNDERPADS AND DIAPERS) ×2 IMPLANT
WATER STERILE IRR 1000ML POUR (IV SOLUTION) ×2 IMPLANT

## 2012-12-06 NOTE — Preoperative (Signed)
Beta Blockers   Reason not to administer Beta Blockers: Toprol 9 a.m. today

## 2012-12-06 NOTE — Anesthesia Procedure Notes (Signed)
Anesthesia Regional Block:  Ankle block  Pre-Anesthetic Checklist: ,, timeout performed, Correct Patient, Correct Site, Correct Laterality, Correct Procedure, Correct Position, site marked, Risks and benefits discussed,  Surgical consent,  Pre-op evaluation,  At surgeon's request and post-op pain management  Laterality: Left  Prep: Maximum Sterile Barrier Precautions used, chloraprep and alcohol swabs       Needles:  Injection technique: Single-shot      Needle Gauge: 25 and 25 G    Additional Needles: Ankle block Narrative:  Start time: 12/06/2012 1:20 PM End time: 12/06/2012 1:25 PM Injection made incrementally with aspirations every 5 mL.  Performed by: Personally  Anesthesiologist: Maren Beach MD  Additional Notes: Pt accepts procedure and risks. 30cc ( 2% Lidocaine and 0.5% Marcaine w/ epi ) Ant Tibial and Post Tibial w/o difficulty. GES

## 2012-12-06 NOTE — Progress Notes (Signed)
Called to see pt for absent doppler signals left foot.  Pt had toe amputation earlier today  PE: Left foot cool pale waxy appearance, no doppler signals Right foot brisk DP/PT doppler signals  Assessment: Ccclusion of left fem peroneal bypass.  This was just revised 3 weeks ago by Dr Arbie Cookey and at that time noted to have small conduit with marginal target artery.    Plan: In light of the fact this bypass was redone November 2013 with composite PTFE/vein and patched just 3 weeks ago and now reoccluded I do not think it is reasonable to try to reopen the bypass as patency would probably again be short lived.  I believe at this point patient needs Left BKA vs AKA.  He will think about this tonight and we will proceed with amputation early next week.  Fabienne Bruns, MD [facility] [place] Office: [phone] Pager: [phone]

## 2012-12-06 NOTE — Anesthesia Preprocedure Evaluation (Addendum)
Anesthesia Evaluation  Patient identified by MRN, date of birth, ID band Patient awake    Reviewed: Allergy & Precautions, H&P , NPO status , Patient's Chart, lab work & pertinent test results, reviewed documented beta blocker date and time   Airway Mallampati: I TM Distance: >3 FB Neck ROM: Full    Dental  (+) Edentulous Upper, Partial Lower and Dental Advisory Given   Pulmonary Current Smoker,          Cardiovascular hypertension, Pt. on home beta blockers + Peripheral Vascular Disease     Neuro/Psych    GI/Hepatic GERD-  Medicated and Controlled,  Endo/Other  diabetesPt denies being diabetic  Renal/GU      Musculoskeletal   Abdominal   Peds  Hematology   Anesthesia Other Findings   Reproductive/Obstetrics                          Anesthesia Physical Anesthesia Plan  ASA: III  Anesthesia Plan: MAC and Regional   Post-op Pain Management:    Induction: Intravenous  Airway Management Planned:   Additional Equipment:   Intra-op Plan:   Post-operative Plan:   Informed Consent:   Plan Discussed with:   Anesthesia Plan Comments:        Anesthesia Quick Evaluation

## 2012-12-06 NOTE — H&P (Signed)
[facility] of [facility]  Patient name: Andrew [name] MRN: [id number] DOB: 05/01/1956 Sex: male  REASON FOR ADMISSION: gangrene of left second toe.  HPI: Andrew [name] is a 57 y.o. male who has undergone multiple revascularization attempts of the left lower extremity. Most recently he had a thrombectomy and a composite PTFE vein fem peroneal artery bypass. 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 He is brought in for toe amputation. The patient drove himself and does not have a ride home nor does he have anyone to stay with him tonight. He is being admitted overnight for observation.  Past Medical History  Diagnosis Date  . Tobacco abuse   . 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 Manson Passey).  Pt will likely require a wide resection and  has been refered to ENT (2/12)  . Dental caries   . Glucose intolerance (impaired glucose tolerance)     Max random CBG 130 (08/29/07) typically about 100. A1C in 11/08 was 5.4.  . Peripheral vascular disease   . Hypertension, essential   . Dyslipidemia   . Anxiety   . Thoracic kyphosis   . Back pain      The patient has chronic lumbar/thoracic back pain, he has kyphosis, mild facet hypertrophy at L1-L2, circumferential annular bulging and mild vertebral body osteophytic formation at L2-L3, mild to moderate stenosis of the medial aspect of the neural foramen at L4-L5, and mild annular bulging at L5-S1.  Marland Kitchen History of alcohol abuse   . Hyperlipidemia   . Peripheral arterial disease   . Leg pain   . Back pain   . Ulcer   . Diabetes mellitus     borderline yrs ago  . GERD (gastroesophageal reflux disease)    Family History  Problem Relation Age of Onset  . Heart disease Mother   . Cancer Mother   . Alcohol abuse Father   . Heart disease Father   . Stroke Father    SOCIAL HISTORY: History  Substance Use Topics  . Smoking status: Current Every  Day Smoker -- 0.50 packs/day for 40 years    Types: Cigarettes  . Smokeless tobacco: Never Used     Comment: trying to quit  . Alcohol Use: Yes     Comment: drinks beer or liquor daily. Last drink 11/09/12   No Known Allergies  Current Facility-Administered Medications  Medication Dose Route Frequency Provider Last Rate Last Dose  . 0.9 %  sodium chloride infusion   Intravenous Continuous Chuck Hint, MD      . fentaNYL (SUBLIMAZE) injection 50-100 mcg  50-100 mcg Intravenous PRN Carrington Clamp, RRT   100 mcg at 12/06/12 1308  . HYDROmorphone (DILAUDID) 1 MG/ML injection           . HYDROmorphone (DILAUDID) injection 0.25-0.5 mg  0.25-0.5 mg Intravenous Q5 min PRN Rivka Barbara, MD      . lactated ringers infusion   Intravenous Continuous Chuck Hint, MD 50 mL/hr at 12/06/12 1145    . ondansetron (ZOFRAN) injection 4 mg  4 mg Intravenous Once PRN Rivka Barbara, MD        REVIEW OF SYSTEMS: Arly.Keller ] denotes positive finding; [  ] denotes negative finding CARDIOVASCULAR:  [ ]  chest pain   [ ]  chest pressure   [ ]  palpitations   [ ]  orthopnea   Arly.Keller ]  dyspnea on exertion   [ ]  claudication   [ ]  rest pain   [ ]  DVT   [ ]  phlebitis PULMONARY:   [ ]  productive cough   [ ]  asthma   [ ]  wheezing NEUROLOGIC:   [ ]  weakness  [ ]  paresthesias  [ ]  aphasia  [ ]  amaurosis  [ ]  dizziness HEMATOLOGIC:   [ ]  bleeding problems   [ ]  clotting disorders MUSCULOSKELETAL:  [ ]  joint pain   [ ]  joint swelling [ ]  leg swelling GASTROINTESTINAL: [ ]   blood in stool  [ ]   hematemesis GENITOURINARY:  [ ]   dysuria  [ ]   hematuria PSYCHIATRIC:  [ ]  history of major depression INTEGUMENTARY:  [ ]  rashes  [ ]  ulcers CONSTITUTIONAL:  [ ]  fever   [ ]  chills  PHYSICAL EXAM: Filed Vitals:   12/06/12 1305 12/06/12 1310 12/06/12 1313 12/06/12 1430  BP:  121/61 139/88 142/85  Pulse: 58 59 70 57  Temp:    98 F (36.7 C)  TempSrc:      Resp: 14 22 13 16   Height:      Weight:       SpO2: 100% 100% 99%    Body mass index is 20.36 kg/(m^2). GENERAL: The patient is any malnourished male, in no acute distress. The vital signs are documented above. CARDIOVASCULAR: There is a regular rate and rhythm.  PULMONARY: There is good air exchange bilaterally without wheezing or rales. ABDOMEN: Soft and non-tender with normal pitched bowel sounds.  MUSCULOSKELETAL: he has a full thickness wound on the distal aspect of his left second toe. SKIN: Gangrenous changes of left second toe. PSYCHIATRIC: The patient has a normal affect.  DATA:  Lab Results  Component Value Date   WBC 9.8 11/11/2012   HGB 13.6 12/06/2012   HCT 40.0 12/06/2012   MCV 85.4 11/11/2012   PLT 294 11/11/2012   Lab Results  Component Value Date   NA 139 12/06/2012   K 3.6 12/06/2012   CL 103 12/06/2012   CO2 29 11/11/2012   Lab Results  Component Value Date   CREATININE 0.80 12/06/2012   Lab Results  Component Value Date   INR 0.88 11/09/2012   INR 1.00 06/11/2012   INR 1.02 05/09/2011   Lab Results  Component Value Date   HGBA1C 5.8* 10/17/2012   CBG (last 3)   Recent Labs  12/06/12 1432  GLUCAP 89   MEDICAL ISSUES: This patient has gangrene of the left second toe. These undergoing amputation of the left second toe and will stay overnight for observation. We will have him fitted for a Darko shoe. He will follow up in the office in 2 weeks. He will be placed on Keflex for 2 weeks for cellulitis of the left foot.  Sophiarose Eades S [facility] of  [place]eeper: [phone]

## 2012-12-06 NOTE — Transfer of Care (Signed)
Immediate Anesthesia Transfer of Care Note  Patient: Andrew [name]  Procedure(s) Performed: Procedure(s) with comments: AMPUTATION DIGIT (Left) - 2ND TOE  Patient Location: PACU  Anesthesia Type:MAC combined with regional for post-op pain  Level of Consciousness: awake, alert , oriented and patient cooperative  Airway & Oxygen Therapy: Patient Spontanous Breathing and room air; pt asked to take O2 mask off  Post-op Assessment: Report given to PACU RN, Post -op Vital signs reviewed and stable and Patient moving all extremities  Post vital signs: Reviewed and stable  Complications: No apparent anesthesia complications

## 2012-12-06 NOTE — Progress Notes (Signed)
Orthopedic Tech Progress Note Patient Details:  Andrew [name] [birth year] [id number]  Ortho Devices Type of Ortho Device: Postop shoe/boot Ortho Device/Splint Location: left foot Ortho Device/Splint Interventions: Application   Nikki Dom 12/06/2012, 8:48 PM

## 2012-12-06 NOTE — Op Note (Signed)
NAME: Andrew [name]   MRN: [id number] DOB: 11/13/1955    DATE OF OPERATION: 12/06/2012  PREOP DIAGNOSIS: gangrene of left second toe  POSTOP DIAGNOSIS: same  PROCEDURE: amputation of left second toe  SURGEON: Di Kindle. Edilia Bo, MD, FACS  ASSIST: none  ANESTHESIA: ankle block   EBL: minimal  INDICATIONS: Andrew [name] is a 57 y.o. male who has undergone previous left lower extremity revascularization. He developed gangrene of the distal aspect of his left second toe. I was asked to proceed with toe amputation. The toe had full-thickness necrosis of the distal aspect of the toe.  FINDINGS: There is no obvious evidence of osteomyelitis.  TECHNIQUE: The patient was brought to the operative room after an ankle block was placed by anesthesia. The left foot was prepped and draped in the usual sterile fashion. A fishmouth incision was made encompassing the left second toe. The dissection was carried down to the proximal phalanx which was divided. The bone appeared healthy. Hemostasis was obtained using electrocautery. Bleeding was marginal. The deep layer was closed with 3-0 Vicryl. The skin was closed with interrupted 3-0 nylon. A sterile dressing was applied. The patient tolerated the procedure well was transferred to the recovery room in stable condition. All needle and sponge counts were correct.  [name], MD, FACS [facility]  DATE OF DICTATION:   12/06/2012

## 2012-12-06 NOTE — Anesthesia Postprocedure Evaluation (Signed)
  Anesthesia Post-op Note  Patient: Andrew [name]  Procedure(s) Performed: Procedure(s) with comments: AMPUTATION DIGIT (Left) - 2ND TOE  Patient Location: PACU  Anesthesia Type:Regional  Level of Consciousness: awake, alert , oriented and patient cooperative  Airway and Oxygen Therapy: Patient Spontanous Breathing  Post-op Pain: none  Post-op Assessment: Post-op Vital signs reviewed, Patient's Cardiovascular Status Stable, Respiratory Function Stable, Patent Airway, No signs of Nausea or vomiting and Pain level controlled  Post-op Vital Signs: stable  Complications: No apparent anesthesia complications

## 2012-12-06 NOTE — Progress Notes (Signed)
12/06/2012 2020 Unable to doppler L dorsalis pedis pulse and L pedal pulse.  L foot is cool and pale.  L popliteal pulse is present via doppler. MD made aware.  MD instructed ACE wrap to be removed.  Will continue to monitor. Zori Benbrook, Avie Echevaria , RN

## 2012-12-06 NOTE — H&P (View-Only) (Signed)
The patient has today from recent thrombectomy of a femoral to peroneal bypass on 11/10/2012. The patient had a remote history of femoroperoneal bypass in 2003 with Dr. Hayes. He had a new right femoral to peroneal bypass by Dr. Dickson November 2013. This was Gore-Tex saphenous vein composite. He presented with several week history of profound ischemia. He was taken to the operating room by myself and underwent thrombectomy of his bypass. This is into a very small peroneal artery which had been seen in his preoperative arteriogram in November 2013. He had vein harvested from his right ankle for a vein patch angioplasty the distal anastomosis. He did have an necrotic changes of the tips of several toes of his left foot.  Today he actually looks good. He does have a palpable graft pulse. His foot is well-perfused. His vein harvest incision is completely healed. He does have some separation in the calf incision from his distal bypass incision. He also has a necrosis of the tips of the second and fifth toe. If it looks as though will be quite small and will demarcate he may require dictation of the tip of his second toe.  He'll be seen again in 2 weeks for a followup. He is requesting narcotic pain medication was given Percocet 5/325 #30 no refills.  I feel that he is at high risk for recurrent occlusion of his bypass is a quite tenuous with Gore-Tex saphenous composite to his distal peroneal which is a small diseased artery. If he has read early reocclusion feel that his best option may be primary amputation of this. We'll see him again in 2 weeks 

## 2012-12-06 NOTE — Interval H&P Note (Signed)
History and Physical Interval Note:  12/06/2012 1:14 PM  Andrew [name]  has presented today for surgery, with the diagnosis of GANGRENE TOE  The various methods of treatment have been discussed with the patient and family. After consideration of risks, benefits and other options for treatment, the patient has consented to  Procedure(s) with comments: AMPUTATION DIGIT (Left) - 2ND TOE as a surgical intervention .  The patient's history has been reviewed, patient examined, no change in status, stable for surgery.  I have reviewed the patient's chart and labs.  Questions were answered to the patient's satisfaction.     DICKSON,CHRISTOPHER S

## 2012-12-07 MED ORDER — DIPHENHYDRAMINE HCL 12.5 MG/5ML PO ELIX
12.5000 mg | ORAL_SOLUTION | Freq: Four times a day (QID) | ORAL | Status: DC | PRN
  Filled 2012-12-07: qty 5

## 2012-12-07 MED ORDER — DIPHENHYDRAMINE HCL 50 MG/ML IJ SOLN: 12.5000 mg | Freq: Four times a day (QID) | INTRAMUSCULAR | Status: DC | PRN

## 2012-12-07 MED ORDER — SODIUM CHLORIDE 0.9 % IJ SOLN: 9.0000 mL | INTRAMUSCULAR | Status: DC | PRN

## 2012-12-07 MED ORDER — ONDANSETRON HCL 4 MG/2ML IJ SOLN: 4.0000 mg | Freq: Four times a day (QID) | INTRAMUSCULAR | Status: DC | PRN

## 2012-12-07 MED ORDER — NALOXONE HCL 0.4 MG/ML IJ SOLN: 0.4000 mg | INTRAMUSCULAR | Status: DC | PRN

## 2012-12-07 MED ORDER — MORPHINE SULFATE (PF) 1 MG/ML IV SOLN
INTRAVENOUS | Status: DC
  Administered 2012-12-07: 18:00:00 via INTRAVENOUS
  Administered 2012-12-08: 59.74 mg via INTRAVENOUS
  Administered 2012-12-08: 08:00:00 via INTRAVENOUS
  Administered 2012-12-08: 24.8 mg via INTRAVENOUS
  Administered 2012-12-08: 22.49 mg via INTRAVENOUS
  Administered 2012-12-08: 11.76 mg via INTRAVENOUS
  Administered 2012-12-08: 12:00:00 via INTRAVENOUS
  Administered 2012-12-08: 26.9 mg via INTRAVENOUS
  Filled 2012-12-07 (×7): qty 25

## 2012-12-07 NOTE — Progress Notes (Addendum)
[facility] [place]  Subjective  -    Objective 127/60 64 98.4 F (36.9 C) (Oral) 18 99%  Intake/Output Summary (Last 24 hours) at 12/07/12 1501 Last data filed at 12/07/12 1408  Gross per 24 hour  Intake    600 ml  Output   4600 ml  Net  -4000 ml   Cool pale left foot, unable to auscultate doppler signals.    Assessment/Planning: POD #1 amputation of left second toe Ccclusion of left fem peroneal bypass Plan BKA left leg by Dr. Arbie Cookey 12/10/2012   Thomasena Edis, EMMA [facility] 12/07/2012 3:01 PM --  Laboratory Lab Results:  Recent Labs  12/06/12 1045 12/06/12 1639  WBC  --  7.5  HGB 13.6 11.9*  HCT 40.0 36.1*  PLT  --  182   BMET  Recent Labs  12/06/12 1045 12/06/12 1639  NA 139 138  K 3.6 4.1  CL 103 103  CO2  --  26  GLUCOSE 102* 89  BUN 8 8  CREATININE 0.80 0.70  CALCIUM  --  9.4    COAG Lab Results  Component Value Date   INR 0.98 12/06/2012   INR 0.88 11/09/2012   INR 1.00 06/11/2012   No results found for this basename: PTT    No options to revise bypass Foot ischemic BKA on Monday by Dr Arbie Cookey Morphine PCA for pain control  Fabienne Bruns, MD [facility] [place] Office: [phone] Pager: [phone]

## 2012-12-08 MED ORDER — KETOROLAC TROMETHAMINE 30 MG/ML IJ SOLN
30.0000 mg | Freq: Four times a day (QID) | INTRAMUSCULAR | Status: AC
  Administered 2012-12-08 – 2012-12-10 (×8): 30 mg via INTRAVENOUS
  Filled 2012-12-08 (×8): qty 1

## 2012-12-08 MED ORDER — NALOXONE HCL 0.4 MG/ML IJ SOLN: 0.4000 mg | INTRAMUSCULAR | Status: DC | PRN

## 2012-12-08 MED ORDER — SODIUM CHLORIDE 0.9 % IJ SOLN: 9.0000 mL | INTRAMUSCULAR | Status: DC | PRN

## 2012-12-08 MED ORDER — MORPHINE SULFATE (PF) 1 MG/ML IV SOLN
INTRAVENOUS | Status: DC
  Administered 2012-12-08: 16:00:00 via INTRAVENOUS

## 2012-12-08 MED ORDER — DIPHENHYDRAMINE HCL 12.5 MG/5ML PO ELIX
12.5000 mg | ORAL_SOLUTION | Freq: Four times a day (QID) | ORAL | Status: DC | PRN
  Filled 2012-12-08: qty 5

## 2012-12-08 MED ORDER — DIPHENHYDRAMINE HCL 50 MG/ML IJ SOLN: 12.5000 mg | Freq: Four times a day (QID) | INTRAMUSCULAR | Status: DC | PRN

## 2012-12-08 MED ORDER — ONDANSETRON HCL 4 MG/2ML IJ SOLN: 4.0000 mg | Freq: Four times a day (QID) | INTRAMUSCULAR | Status: DC | PRN

## 2012-12-08 MED ORDER — ONDANSETRON HCL 4 MG/2ML IJ SOLN
4.0000 mg | Freq: Four times a day (QID) | INTRAMUSCULAR | Status: DC | PRN
Start: 2012-12-08 — End: 2012-12-13

## 2012-12-08 MED ORDER — MORPHINE SULFATE (PF) 1 MG/ML IV SOLN
INTRAVENOUS | Status: DC
  Administered 2012-12-08: 21.89 mg via INTRAVENOUS
  Administered 2012-12-09: 1 mg via INTRAVENOUS
  Administered 2012-12-09: 19:00:00 via INTRAVENOUS
  Administered 2012-12-09: 16 mg via INTRAVENOUS
  Administered 2012-12-09: 10:00:00 via INTRAVENOUS
  Administered 2012-12-09: 11 mg via INTRAVENOUS
  Administered 2012-12-09: 14 mg via INTRAVENOUS
  Administered 2012-12-09: 1 mg via INTRAVENOUS
  Administered 2012-12-09: 22 mg via INTRAVENOUS
  Administered 2012-12-10: 3 mg via INTRAVENOUS
  Administered 2012-12-10: 18:00:00 via INTRAVENOUS
  Administered 2012-12-10: 11 mg via INTRAVENOUS
  Administered 2012-12-10: 5 mg via INTRAVENOUS
  Administered 2012-12-10: 3 mg via INTRAVENOUS
  Administered 2012-12-10: 22:00:00 via INTRAVENOUS
  Administered 2012-12-11: 4 mg via INTRAVENOUS
  Administered 2012-12-11: 02:00:00 via INTRAVENOUS
  Filled 2012-12-08 (×8): qty 25

## 2012-12-08 MED ORDER — KETOROLAC TROMETHAMINE 30 MG/ML IJ SOLN
30.0000 mg | Freq: Once | INTRAMUSCULAR | Status: AC
  Administered 2012-12-08: 30 mg via INTRAVENOUS
  Filled 2012-12-08: qty 1

## 2012-12-08 NOTE — Progress Notes (Signed)
The patient stated that his pain level was at 10 at 3 AM. His respiration rate would fall to 7-8 breaths/minute on the PCA while at rest.  When engaging the patient in conversation, his respiration rate would climb to 10-12 breaths/minute.  The MD was made aware.  New orders were given for 30 mg of IV Toradol.  Upon reassessment, the patient stated that his pain level was at a 6 out of 10.  When the Toradol arrived on the unit, the patient was asleep.  The RN will continue to monitor the patient.

## 2012-12-08 NOTE — Progress Notes (Signed)
I agree with the above. The patient has occluded his left leg bypass graft. He has been deemed to not be a candidate for further attempts at revascularization. Currently the plan is to undergo amputation on Monday. The patient has had significant pain in his foot. This has not been alleviated with his PCA. In addition he has been somewhat lethargic secondary to his pain medication. He was started on Toradol last night and appears to have had some benefit.  The foot and lower leg are cool up to the upper third of the leg. He wants to do everything to attempt a below knee amputation. I discussed with him that he may not have adequate blood supply to heal a below knee amputation. This could ultimately result in the need for conversion to an above-knee amputation. This is been scheduled for Monday.  Durene Cal

## 2012-12-08 NOTE — Progress Notes (Signed)
Patient on Morphine Full dose PCA. Respirations deep/shallow 7 bpm; O2 sat 98-99%. Spoke with Dr. Myra Gianotti; order given to change to reduced dose and to continue Toradol. Will monitor. Mamie Levers

## 2012-12-08 NOTE — Progress Notes (Signed)
Patient in severe pain. Patient grimacing and holding left leg. Toradol 30mg  IV was given @ 1730 per order. Patient received Vicodin po per order as well. Spoke with Dr. Myra Gianotti regarding continuous sever pain and respiratory status. Order given to change back to Full dose PCA. Will monitor. Mamie Levers

## 2012-12-08 NOTE — Progress Notes (Signed)
Patient remains in consistent pain. States 10/10 on pain scale. Toradol given per order. PCA tends to lockout d/t low RR; patient fully awake and appears to be in no respiratory distress. O2 sat 99%. Patient tends to hold breath at times during pain; encouraged to deep breath. Will continue to monitor. Left foot dressing changed per order; site unremarkable. Mamie Levers

## 2012-12-08 NOTE — Progress Notes (Addendum)
[facility] Progress Note  12/08/2012 9:16 AM 2 Days Post-Op  Subjective:  States his foot is burning like it is on fire and cold as ice at the same time.  Continues to have a lot of pain.  Afebrile VSS 99% 1.5LO2NC  Filed Vitals:   12/08/12 0749  BP:   Pulse:   Temp:   Resp: 10    Physical Exam: Incisions:  Left foot is wrapped Extremities:  Left foot and lower leg cool to touch  CBC    Component Value Date/Time   WBC 7.5 12/06/2012 1639   RBC 4.15* 12/06/2012 1639   HGB 11.9* 12/06/2012 1639   HCT 36.1* 12/06/2012 1639   PLT 182 12/06/2012 1639   MCV 87.0 12/06/2012 1639   MCH 28.7 12/06/2012 1639   MCHC 33.0 12/06/2012 1639   RDW 14.7 12/06/2012 1639   LYMPHSABS 1.4 11/09/2012 1943   MONOABS 0.8 11/09/2012 1943   EOSABS 0.2 11/09/2012 1943   BASOSABS 0.0 11/09/2012 1943    BMET    Component Value Date/Time   NA 138 12/06/2012 1639   K 4.1 12/06/2012 1639   CL 103 12/06/2012 1639   CO2 26 12/06/2012 1639   GLUCOSE 89 12/06/2012 1639   BUN 8 12/06/2012 1639   CREATININE 0.70 12/06/2012 1639   CALCIUM 9.4 12/06/2012 1639   GFRNONAA >90 12/06/2012 1639   GFRAA >90 12/06/2012 1639    INR    Component Value Date/Time   INR 0.98 12/06/2012 1639     Intake/Output Summary (Last 24 hours) at 12/08/12 0916 Last data filed at 12/08/12 0755  Gross per 24 hour  Intake    720 ml  Output   4475 ml  Net  -3755 ml     Assessment/Plan:  57 y.o. male is s/p:  amputation of left second toe   2 Days Post-Op  -pt with no options for revision of his bypass graft and will require BKA on Monday by Dr. Arbie Cookey. -Pt continues to have tremendous pain.  States the Toradol was helpful.  His renal function is good.  Will schedule Toradol 30 mg IV q6h for 2 days to supplement PCA. -DVT prophylaxis:  lovenox -will check BMP in am  Doreatha Massed, PA-C [facility] [phone] 12/08/2012 9:16 AM

## 2012-12-09 LAB — BASIC METABOLIC PANEL
CO2: 28 mEq/L (ref 19–32)
Calcium: 9.2 mg/dL (ref 8.4–10.5)
Creatinine, Ser: 0.83 mg/dL (ref 0.50–1.35)
GFR calc non Af Amer: >90 mL/min (ref >90)
Sodium: 134 mEq/L — ABNORMAL LOW (ref 135–145)

## 2012-12-09 MED ORDER — DEXTROSE 5 % IV SOLN
1.5000 g | INTRAVENOUS | Status: AC
  Administered 2012-12-10: 1.5 g via INTRAVENOUS
  Filled 2012-12-09: qty 1.5

## 2012-12-09 NOTE — Progress Notes (Signed)
[facility] [place]  Subjective    The patient continues to complain of pain in his left foot. We have had difficulty controlling his pain. He has had issues with respiratory depression secondary to narcotic use. He was started on Toradol for some benefit.   Physical Exam:  The left foot continues to remain ischemic. The leg is cool up to the upper calf.       Assessment/Plan:    The patient is scheduled for a below knee versus above-knee amputation tomorrow. I've encouraged the patient to hang his leg over the bed to the gravity assist with perfusion.  Arleth Mccullar IV, V. WELLS 12/09/2012 10:36 AM --  Filed Vitals:   12/09/12 1026  BP: 113/86  Pulse:   Temp:   Resp:     Intake/Output Summary (Last 24 hours) at 12/09/12 1036 Last data filed at 12/09/12 0937  Gross per 24 hour  Intake    480 ml  Output   1450 ml  Net   -970 ml     Laboratory CBC    Component Value Date/Time   WBC 7.5 12/06/2012 1639   HGB 11.9* 12/06/2012 1639   HCT 36.1* 12/06/2012 1639   PLT 182 12/06/2012 1639    BMET    Component Value Date/Time   NA 134* 12/09/2012 0534   K 3.8 12/09/2012 0534   CL 97 12/09/2012 0534   CO2 28 12/09/2012 0534   GLUCOSE 108* 12/09/2012 0534   BUN 16 12/09/2012 0534   CREATININE 0.83 12/09/2012 0534   CALCIUM 9.2 12/09/2012 0534   GFRNONAA >90 12/09/2012 0534   GFRAA >90 12/09/2012 0534    COAG Lab Results  Component Value Date   INR 0.98 12/06/2012   INR 0.88 11/09/2012   INR 1.00 06/11/2012   No results found for this basename: PTT    Antibiotics Anti-infectives   Start     Dose/Rate Route Frequency Ordered Stop   12/06/12 1700  cephALEXin (KEFLEX) capsule 500 mg     500 mg Oral 3 times per day 12/06/12 1456     12/05/12 1440  cefUROXime (ZINACEF) 1.5 g in dextrose 5 % 50 mL IVPB     1.5 g 100 mL/hr over 30 Minutes Intravenous 30 min pre-op 12/05/12 1440 12/06/12 1345       V. Charlena Cross, M.D. [facility]  [place] Office: [phone] Pager:  [phone]

## 2012-12-10 ENCOUNTER — Encounter (HOSPITAL_COMMUNITY)
Admission: RE | Disposition: A | Discharge status: Rehabilitation Facility | Payer: Self-pay | Source: Ambulatory Visit | Attending: Vascular Surgery

## 2012-12-10 ENCOUNTER — Encounter (HOSPITAL_COMMUNITY): Payer: Self-pay | Admitting: Anesthesiology

## 2012-12-10 ENCOUNTER — Encounter: Payer: Self-pay | Admitting: Vascular Surgery

## 2012-12-10 ENCOUNTER — Encounter (HOSPITAL_COMMUNITY): Payer: Self-pay | Admitting: Certified Registered"

## 2012-12-10 ENCOUNTER — Inpatient Hospital Stay (HOSPITAL_COMMUNITY): Payer: Medicare Other | Admitting: Anesthesiology

## 2012-12-10 DIAGNOSIS — I70269 Atherosclerosis of native arteries of extremities with gangrene, unspecified extremity: Secondary | ICD-10-CM

## 2012-12-10 HISTORY — PX: AMPUTATION: SHX166

## 2012-12-10 LAB — CBC
HCT: 33.8 % — ABNORMAL LOW (ref 39.0–52.0)
MCHC: 33.7 g/dL (ref 30.0–36.0)
MCV: 85.4 fL (ref 78.0–100.0)
Platelets: 195 10*3/uL (ref 150–400)
RDW: 13.9 % (ref 11.5–15.5)
WBC: 8.4 10*3/uL (ref 4.0–10.5)

## 2012-12-10 LAB — BASIC METABOLIC PANEL
BUN: 16 mg/dL (ref 6–23)
CO2: 26 mEq/L (ref 19–32)
Calcium: 9.3 mg/dL (ref 8.4–10.5)
Chloride: 95 mEq/L — ABNORMAL LOW (ref 96–112)
Creatinine, Ser: 0.82 mg/dL (ref 0.50–1.35)

## 2012-12-10 LAB — PROTIME-INR
INR: 0.93 (ref 0.00–1.49)
Prothrombin Time: 12.4 seconds (ref 11.6–15.2)

## 2012-12-10 SURGERY — AMPUTATION BELOW KNEE
Anesthesia: General | Site: Leg Lower | Laterality: Left | Wound class: Clean

## 2012-12-10 MED ORDER — HYDRALAZINE HCL 20 MG/ML IJ SOLN: 10.0000 mg | INTRAMUSCULAR | Status: DC | PRN

## 2012-12-10 MED ORDER — LACTATED RINGERS IV SOLN
INTRAVENOUS | Status: DC | PRN
  Administered 2012-12-10 (×2): via INTRAVENOUS

## 2012-12-10 MED ORDER — ACETAMINOPHEN 325 MG PO TABS: 325.0000 mg | ORAL_TABLET | ORAL | Status: DC | PRN

## 2012-12-10 MED ORDER — PHENOL 1.4 % MT LIQD: 1.0000 | OROMUCOSAL | Status: DC | PRN

## 2012-12-10 MED ORDER — PANTOPRAZOLE SODIUM 40 MG PO TBEC: 40.0000 mg | DELAYED_RELEASE_TABLET | Freq: Every day | ORAL | Status: DC

## 2012-12-10 MED ORDER — 0.9 % SODIUM CHLORIDE (POUR BTL) OPTIME
TOPICAL | Status: DC | PRN
  Administered 2012-12-10: 1000 mL

## 2012-12-10 MED ORDER — POTASSIUM CHLORIDE CRYS ER 20 MEQ PO TBCR: 20.0000 meq | EXTENDED_RELEASE_TABLET | Freq: Once | ORAL | Status: AC | PRN

## 2012-12-10 MED ORDER — OXYCODONE HCL 5 MG PO TABS: 5.0000 mg | ORAL_TABLET | Freq: Once | ORAL | Status: DC | PRN

## 2012-12-10 MED ORDER — OXYCODONE HCL 5 MG/5ML PO SOLN: 5.0000 mg | Freq: Once | ORAL | Status: DC | PRN

## 2012-12-10 MED ORDER — ONDANSETRON HCL 4 MG/2ML IJ SOLN
INTRAMUSCULAR | Status: DC | PRN
  Administered 2012-12-10: 4 mg via INTRAVENOUS

## 2012-12-10 MED ORDER — PROPOFOL 10 MG/ML IV BOLUS
INTRAVENOUS | Status: DC | PRN
  Administered 2012-12-10: 30 mg via INTRAVENOUS
  Administered 2012-12-10: 50 mg via INTRAVENOUS
  Administered 2012-12-10: 30 mg via INTRAVENOUS
  Administered 2012-12-10: 170 mg via INTRAVENOUS

## 2012-12-10 MED ORDER — ENOXAPARIN SODIUM 40 MG/0.4ML ~~LOC~~ SOLN
40.0000 mg | SUBCUTANEOUS | Status: DC
  Administered 2012-12-11 – 2012-12-13 (×3): 40 mg via SUBCUTANEOUS
  Filled 2012-12-10 (×3): qty 0.4

## 2012-12-10 MED ORDER — METOPROLOL TARTRATE 1 MG/ML IV SOLN: 2.0000 mg | INTRAVENOUS | Status: DC | PRN

## 2012-12-10 MED ORDER — POTASSIUM CHLORIDE IN NACL 20-0.9 MEQ/L-% IV SOLN
INTRAVENOUS | Status: DC
  Administered 2012-12-10: 75 mL/h via INTRAVENOUS
  Filled 2012-12-10 (×7): qty 1000

## 2012-12-10 MED ORDER — ONDANSETRON HCL 4 MG/2ML IJ SOLN: 4.0000 mg | Freq: Four times a day (QID) | INTRAMUSCULAR | Status: DC | PRN

## 2012-12-10 MED ORDER — HYDROMORPHONE HCL PF 1 MG/ML IJ SOLN
INTRAMUSCULAR | Status: AC
  Filled 2012-12-10: qty 1

## 2012-12-10 MED ORDER — HYDROMORPHONE HCL PF 1 MG/ML IJ SOLN
INTRAMUSCULAR | Status: DC | PRN
  Administered 2012-12-10: .2 mg via INTRAVENOUS
  Administered 2012-12-10: .4 mg via INTRAVENOUS
  Administered 2012-12-10 (×2): .2 mg via INTRAVENOUS

## 2012-12-10 MED ORDER — DEXTROSE 5 % IV SOLN
1.5000 g | Freq: Two times a day (BID) | INTRAVENOUS | Status: AC
  Administered 2012-12-10 – 2012-12-11 (×2): 1.5 g via INTRAVENOUS
  Filled 2012-12-10 (×2): qty 1.5

## 2012-12-10 MED ORDER — HYDROMORPHONE HCL PF 1 MG/ML IJ SOLN
0.2500 mg | INTRAMUSCULAR | Status: DC | PRN
  Administered 2012-12-10 (×4): 0.5 mg via INTRAVENOUS

## 2012-12-10 MED ORDER — LABETALOL HCL 5 MG/ML IV SOLN
10.0000 mg | INTRAVENOUS | Status: DC | PRN
  Filled 2012-12-10: qty 4

## 2012-12-10 MED ORDER — PHENYLEPHRINE HCL 10 MG/ML IJ SOLN
INTRAMUSCULAR | Status: DC | PRN
  Administered 2012-12-10: 80 ug via INTRAVENOUS
  Administered 2012-12-10: 40 ug via INTRAVENOUS
  Administered 2012-12-10: 80 ug via INTRAVENOUS

## 2012-12-10 MED ORDER — DOCUSATE SODIUM 100 MG PO CAPS
100.0000 mg | ORAL_CAPSULE | Freq: Every day | ORAL | Status: DC
  Administered 2012-12-11 – 2012-12-13 (×3): 100 mg via ORAL
  Filled 2012-12-10 (×3): qty 1

## 2012-12-10 MED ORDER — ACETAMINOPHEN 650 MG RE SUPP: 325.0000 mg | RECTAL | Status: DC | PRN

## 2012-12-10 MED ORDER — MIDAZOLAM HCL 5 MG/5ML IJ SOLN
INTRAMUSCULAR | Status: DC | PRN
  Administered 2012-12-10: 2 mg via INTRAVENOUS

## 2012-12-10 MED ORDER — LIDOCAINE HCL (CARDIAC) 20 MG/ML IV SOLN
INTRAVENOUS | Status: DC | PRN
  Administered 2012-12-10: 70 mg via INTRAVENOUS

## 2012-12-10 MED ORDER — FENTANYL CITRATE 0.05 MG/ML IJ SOLN
INTRAMUSCULAR | Status: DC | PRN
  Administered 2012-12-10: 50 ug via INTRAVENOUS
  Administered 2012-12-10 (×3): 25 ug via INTRAVENOUS
  Administered 2012-12-10: 50 ug via INTRAVENOUS
  Administered 2012-12-10: 100 ug via INTRAVENOUS
  Administered 2012-12-10: 50 ug via INTRAVENOUS
  Administered 2012-12-10: 25 ug via INTRAVENOUS

## 2012-12-10 SURGICAL SUPPLY — 53 items
BANDAGE ELASTIC 4 VELCRO ST LF (GAUZE/BANDAGES/DRESSINGS) ×1 IMPLANT
BANDAGE ELASTIC 6 VELCRO ST LF (GAUZE/BANDAGES/DRESSINGS) IMPLANT
BANDAGE ESMARK 6X9 LF (GAUZE/BANDAGES/DRESSINGS) IMPLANT
BANDAGE GAUZE ELAST BULKY 4 IN (GAUZE/BANDAGES/DRESSINGS) ×1 IMPLANT
BNDG CMPR 9X6 STRL LF SNTH (GAUZE/BANDAGES/DRESSINGS)
BNDG COHESIVE 6X5 TAN STRL LF (GAUZE/BANDAGES/DRESSINGS) ×1 IMPLANT
BNDG ESMARK 6X9 LF (GAUZE/BANDAGES/DRESSINGS)
CANISTER SUCTION 2500CC (MISCELLANEOUS) ×2 IMPLANT
CLIP LIGATING EXTRA MED SLVR (CLIP) ×2 IMPLANT
CLIP LIGATING EXTRA SM BLUE (MISCELLANEOUS) ×2 IMPLANT
CLOTH BEACON ORANGE TIMEOUT ST (SAFETY) ×2 IMPLANT
COVER SURGICAL LIGHT HANDLE (MISCELLANEOUS) ×2 IMPLANT
CUFF TOURNIQUET SINGLE 34IN LL (TOURNIQUET CUFF) IMPLANT
CUFF TOURNIQUET SINGLE 44IN (TOURNIQUET CUFF) IMPLANT
DRAIN SNY 10X20 3/4 PERF (WOUND CARE) IMPLANT
DRAPE ORTHO SPLIT 77X108 STRL (DRAPES) ×4
DRAPE PROXIMA HALF (DRAPES) ×2 IMPLANT
DRAPE SURG ORHT 6 SPLT 77X108 (DRAPES) ×2 IMPLANT
ELECT REM PT RETURN 9FT ADLT (ELECTROSURGICAL) ×2
ELECTRODE REM PT RTRN 9FT ADLT (ELECTROSURGICAL) ×1 IMPLANT
EVACUATOR SILICONE 100CC (DRAIN) IMPLANT
GAUZE XEROFORM 5X9 LF (GAUZE/BANDAGES/DRESSINGS) ×2 IMPLANT
GLOVE BIOGEL PI IND STRL 6.5 (GLOVE) IMPLANT
GLOVE BIOGEL PI IND STRL 7.0 (GLOVE) IMPLANT
GLOVE BIOGEL PI INDICATOR 6.5 (GLOVE) ×1
GLOVE BIOGEL PI INDICATOR 7.0 (GLOVE) ×1
GLOVE ECLIPSE 7.5 STRL STRAW (GLOVE) ×2 IMPLANT
GLOVE SS BIOGEL STRL SZ 6.5 (GLOVE) IMPLANT
GLOVE SS BIOGEL STRL SZ 7.5 (GLOVE) ×1 IMPLANT
GLOVE SUPERSENSE BIOGEL SZ 6.5 (GLOVE) ×1
GLOVE SUPERSENSE BIOGEL SZ 7.5 (GLOVE) ×1
GOWN STRL NON-REIN LRG LVL3 (GOWN DISPOSABLE) ×5 IMPLANT
GOWN STRL REIN XL XLG (GOWN DISPOSABLE) ×1 IMPLANT
KIT BASIN OR (CUSTOM PROCEDURE TRAY) ×2 IMPLANT
KIT ROOM TURNOVER OR (KITS) ×2 IMPLANT
NS IRRIG 1000ML POUR BTL (IV SOLUTION) ×2 IMPLANT
PACK GENERAL/GYN (CUSTOM PROCEDURE TRAY) ×2 IMPLANT
PAD ARMBOARD 7.5X6 YLW CONV (MISCELLANEOUS) ×4 IMPLANT
PADDING CAST COTTON 6X4 STRL (CAST SUPPLIES) IMPLANT
SAW GIGLI STERILE 20 (MISCELLANEOUS) ×2 IMPLANT
SPONGE GAUZE 4X4 12PLY (GAUZE/BANDAGES/DRESSINGS) ×2 IMPLANT
STAPLER VISISTAT 35W (STAPLE) ×2 IMPLANT
STOCKINETTE IMPERVIOUS LG (DRAPES) ×2 IMPLANT
SUT ETHILON 3 0 PS 1 (SUTURE) ×1 IMPLANT
SUT VIC AB 0 CT1 18XCR BRD 8 (SUTURE) ×2 IMPLANT
SUT VIC AB 0 CT1 8-18 (SUTURE) ×4
SUT VICRYL 2 0 18  TIES (SUTURE) ×1
SUT VICRYL 2 0 18 TIES (SUTURE) IMPLANT
SUT VICRYL AB 2 0 TIES (SUTURE) ×2 IMPLANT
TOWEL OR 17X24 6PK STRL BLUE (TOWEL DISPOSABLE) ×2 IMPLANT
TOWEL OR 17X26 10 PK STRL BLUE (TOWEL DISPOSABLE) ×2 IMPLANT
UNDERPAD 30X30 INCONTINENT (UNDERPADS AND DIAPERS) ×2 IMPLANT
WATER STERILE IRR 1000ML POUR (IV SOLUTION) ×1 IMPLANT

## 2012-12-10 NOTE — Anesthesia Preprocedure Evaluation (Addendum)
Anesthesia Evaluation  Patient identified by MRN, date of birth, ID band Patient awake    Reviewed: Allergy & Precautions, H&P , NPO status , Patient's Chart, lab work & pertinent test results  Airway Mallampati: II TM Distance: >3 FB Neck ROM: full    Dental  (+) Edentulous Upper, Poor Dentition and Dental Advisory Given   Pulmonary Current Smoker,          Cardiovascular hypertension, Pt. on medications + Peripheral Vascular Disease     Neuro/Psych PSYCHIATRIC DISORDERS Anxiety    GI/Hepatic GERD-  ,  Endo/Other  diabetes (on no diabetes meds), Type 2  Renal/GU      Musculoskeletal   Abdominal   Peds  Hematology   Anesthesia Other Findings   Reproductive/Obstetrics                          Anesthesia Physical Anesthesia Plan  ASA: III  Anesthesia Plan: General   Post-op Pain Management:    Induction: Intravenous  Airway Management Planned: LMA  Additional Equipment:   Intra-op Plan:   Post-operative Plan:   Informed Consent: I have reviewed the patients History and Physical, chart, labs and discussed the procedure including the risks, benefits and alternatives for the proposed anesthesia with the patient or authorized representative who has indicated his/her understanding and acceptance.     Plan Discussed with: CRNA, Anesthesiologist and Surgeon  Anesthesia Plan Comments:         Anesthesia Quick Evaluation

## 2012-12-10 NOTE — Interval H&P Note (Signed)
History and Physical Interval Note:  12/10/2012 9:51 AM  Andrew [name]  has presented today for surgery, with the diagnosis of PVD  The various methods of treatment have been discussed with the patient and family. After consideration of risks, benefits and other options for treatment, the patient has consented to  Procedure(s): AMPUTATION BELOW KNEE VS ABOVE KNEE (N/A) as a surgical intervention .  The patient's history has been reviewed, patient examined, no change in status, stable for surgery.  I have reviewed the patient's chart and labs.  Questions were answered to the patient's satisfaction.     [name]

## 2012-12-10 NOTE — Clinical Documentation Improvement (Signed)
GENERIC DOCUMENTATION CLARIFICATION QUERY  THIS DOCUMENT IS NOT A PERMANENT PART OF THE MEDICAL RECORD  TO RESPOND TO THE THIS QUERY, FOLLOW THE INSTRUCTIONS BELOW:  1. If needed, update documentation for the patient's encounter via the notes activity.  2. Access this query again and click edit on the In [employer].  3. After updating, or not, click F2 to complete all highlighted (required) fields concerning your review. Select "additional documentation in the medical record" OR "no additional documentation provided".  4. Click Sign note button.  5. The deficiency will fall out of your In Basket *Please let us know if you are not able to complete this workflow by phone or e-mail (listed below).  Please update your documentation within the medical record to reflect your response to this query.                                                                                        12/10/12   Dear Dr. Edilia Bo / Associates,  In a better effort to capture your patient's severity of illness, reflect appropriate length of stay and utilization of resources, a review of the patient medical record has revealed the following indicators.    Based on your clinical judgment, please clarify and document in a progress note and/or discharge summary the clinical condition associated with the following supporting information:  In responding to this query please exercise your independent judgment.  The fact that a query is asked, does not imply that any particular answer is desired or expected.    Possible Clinical Conditions?  _______Hyponatremia   _______Other Condition  _______Cannot Clinically Determine      Supporting Information:  Risk Factors:  Signs & Symptoms:  Diagnostics: 5/05: sodium: 131. 5/04: sodium: 134. 5/01: sodium: 139.   Treatment: 5/05: 0.9% NaCl w/85meq @ 51ml/hr.   You may use possible, probable, or suspect with inpatient documentation. possible, probable,  suspected diagnoses MUST be documented at the time of discharge  Reviewed: additional documentation in the medical record  Thank You,  Marciano Sequin,  Clinical Documentation Specialist:  Phone: [phone]  Health Information Management [place]

## 2012-12-10 NOTE — Anesthesia Procedure Notes (Signed)
Procedure Name: LMA Insertion Date/Time: 12/10/2012 10:18 AM Performed by: Arlice Colt B Pre-anesthesia Checklist: Patient identified, Emergency Drugs available, Suction available, Patient being monitored and Timeout performed Patient Re-evaluated:Patient Re-evaluated prior to inductionOxygen Delivery Method: Circle system utilized Preoxygenation: Pre-oxygenation with 100% oxygen Intubation Type: IV induction LMA: LMA inserted LMA Size: 4.0 Number of attempts: 1 Placement Confirmation: positive ETCO2 and breath sounds checked- equal and bilateral Tube secured with: Tape Dental Injury: Teeth and Oropharynx as per pre-operative assessment

## 2012-12-10 NOTE — Transfer of Care (Signed)
Immediate Anesthesia Transfer of Care Note  Patient: Andrew [name]  Procedure(s) Performed: Procedure(s): AMPUTATION BELOW KNEE  (Left)  Patient Location: PACU  Anesthesia Type:General  Level of Consciousness: awake, alert , oriented and patient cooperative  Airway & Oxygen Therapy: Patient Spontanous Breathing and Patient connected to nasal cannula oxygen  Post-op Assessment: Post -op Vital signs reviewed and stable  Post vital signs: Reviewed and stable  Complications: No apparent anesthesia complications

## 2012-12-10 NOTE — H&P (View-Only) (Signed)
[facility] [place]  Subjective    The patient continues to complain of pain in his left foot. We have had difficulty controlling his pain. He has had issues with respiratory depression secondary to narcotic use. He was started on Toradol for some benefit.   Physical Exam:  The left foot continues to remain ischemic. The leg is cool up to the upper calf.       Assessment/Plan:    The patient is scheduled for a below knee versus above-knee amputation tomorrow. I've encouraged the patient to hang his leg over the bed to the gravity assist with perfusion.  Ramaj Frangos IV, V. WELLS 12/09/2012 10:36 AM --  Filed Vitals:   12/09/12 1026  BP: 113/86  Pulse:   Temp:   Resp:     Intake/Output Summary (Last 24 hours) at 12/09/12 1036 Last data filed at 12/09/12 0937  Gross per 24 hour  Intake    480 ml  Output   1450 ml  Net   -970 ml     Laboratory CBC    Component Value Date/Time   WBC 7.5 12/06/2012 1639   HGB 11.9* 12/06/2012 1639   HCT 36.1* 12/06/2012 1639   PLT 182 12/06/2012 1639    BMET    Component Value Date/Time   NA 134* 12/09/2012 0534   K 3.8 12/09/2012 0534   CL 97 12/09/2012 0534   CO2 28 12/09/2012 0534   GLUCOSE 108* 12/09/2012 0534   BUN 16 12/09/2012 0534   CREATININE 0.83 12/09/2012 0534   CALCIUM 9.2 12/09/2012 0534   GFRNONAA >90 12/09/2012 0534   GFRAA >90 12/09/2012 0534    COAG Lab Results  Component Value Date   INR 0.98 12/06/2012   INR 0.88 11/09/2012   INR 1.00 06/11/2012   No results found for this basename: PTT    Antibiotics Anti-infectives   Start     Dose/Rate Route Frequency Ordered Stop   12/06/12 1700  cephALEXin (KEFLEX) capsule 500 mg     500 mg Oral 3 times per day 12/06/12 1456     12/05/12 1440  cefUROXime (ZINACEF) 1.5 g in dextrose 5 % 50 mL IVPB     1.5 g 100 mL/hr over 30 Minutes Intravenous 30 min pre-op 12/05/12 1440 12/06/12 1345       V. Wells Osias Resnick IV, M.D. [facility]  [place] Office: [phone] Pager:  [phone]   

## 2012-12-10 NOTE — Progress Notes (Signed)
Physical medicine and rehabilitation consult requested. Patient currently still in recovery after left below-knee amputation today 12/10/2012. Will followup once patient arrives to room and therapy evaluations completed

## 2012-12-10 NOTE — Op Note (Signed)
OPERATIVE REPORT  DATE OF SURGERY: 12/10/2012  PATIENT: Andrew [name], 57 y.o. male MRN: [id number]  DOB: June 05, 1956  PRE-OPERATIVE DIAGNOSIS: Severe ischemia left foot   POST-OPERATIVE DIAGNOSIS:  Same  PROCEDURE: Left below knee amputation  SURGEON:  Gretta Began, M.D.  PHYSICIAN ASSISTANT: Roczniak  ANESTHESIA:  Gen.  EBL: 250 ml  Total I/O In: 1500 [I.V.:1500] Out: 250 [Blood:250]  BLOOD ADMINISTERED: None  DRAINS: None  SPECIMEN: Left lower extremity amputation  COUNTS CORRECT:  YES  PLAN OF CARE: PACU   PATIENT DISPOSITION:  PACU - hemodynamically stable  PROCEDURE DETAILS: The patient was taken to the operating room placed supine position where the area of the left leg was prepped and draped in usual sterile fashion. I discussed level of education the patient in the holding area. He did have a warm calf and did have no tenderness in his calf. He understood the higher chance of nonhealing amputation of the below-knee then the above-knee area but also understood that her rehabilitation potential below-knee amputation and he wished to proceed below-knee amputation  An incision was made several fingerbreadths below the tibial prominence and carried down to the anterior tibial muscle bodies with electrocautery ring. Anterior tibial neurovascular bundle was ligated with 0 Vicryl ties and divided. The posterior-based myocutaneous flap with gastrocnemius muscle and skin was left intact. There was good bleeding from all muscle bodies with no evidence of infection and no evidence of ischemic muscle. The popliteal artery was identified and ligated with 0 Vicryl ties as was the vein periosteum was elevated off the fibula and this was divided with bone shears. The periosteum was elevated off the tibia and this was divided with a Gigli saw. The edges of the gate of the tibia were smoothed with a bone rasp. The anterior portion was beveled. The specimen was passed off the field. The  wounds were irrigated with saline and hemostasis was obtained electrocautery. The patient did have a thrombosed prosthetic Gore-Tex graft and this was mobilized further proximally above the skin edge was ligated and divided. The anterior fascia was closed the posterior fascia with interrupted 0 Vicryl figure-of-eight sutures. The skin was closed with skin clips. Form gauze and sterile dressing was placed. A 4 inch Ace was placed over this. The patient was taken to the recovery room in stable condition   Gretta Began, M.D. 12/10/2012 12:06 PM

## 2012-12-11 ENCOUNTER — Ambulatory Visit: Payer: Medicare Other | Admitting: Vascular Surgery

## 2012-12-11 ENCOUNTER — Encounter (HOSPITAL_COMMUNITY): Payer: Self-pay | Admitting: Vascular Surgery

## 2012-12-11 DIAGNOSIS — S88119A Complete traumatic amputation at level between knee and ankle, unspecified lower leg, initial encounter: Secondary | ICD-10-CM

## 2012-12-11 DIAGNOSIS — L98499 Non-pressure chronic ulcer of skin of other sites with unspecified severity: Secondary | ICD-10-CM

## 2012-12-11 DIAGNOSIS — I739 Peripheral vascular disease, unspecified: Secondary | ICD-10-CM

## 2012-12-11 LAB — CBC
Hemoglobin: 10.8 g/dL — ABNORMAL LOW (ref 13.0–17.0)
RBC: 3.7 MIL/uL — ABNORMAL LOW (ref 4.22–5.81)
WBC: 10.5 10*3/uL (ref 4.0–10.5)

## 2012-12-11 LAB — BASIC METABOLIC PANEL
GFR calc Af Amer: >90 mL/min (ref >90)
GFR calc non Af Amer: >90 mL/min (ref >90)
Potassium: 4.3 mEq/L (ref 3.5–5.1)
Sodium: 139 mEq/L (ref 135–145)

## 2012-12-11 MED ORDER — CLONAZEPAM 1 MG PO TABS
2.0000 mg | ORAL_TABLET | Freq: Every evening | ORAL | Status: DC | PRN
  Administered 2012-12-11 – 2012-12-13 (×2): 2 mg via ORAL
  Filled 2012-12-11 (×2): qty 2

## 2012-12-11 MED ORDER — CLONAZEPAM 1 MG PO TABS: 1.0000 mg | ORAL_TABLET | Freq: Every day | ORAL | Status: DC | PRN

## 2012-12-11 MED ORDER — MORPHINE SULFATE 2 MG/ML IJ SOLN: 2.0000 mg | INTRAMUSCULAR | Status: DC | PRN

## 2012-12-11 NOTE — Progress Notes (Addendum)
Subjective: Interval History: none.. pain well-controlled.difficulty with the PCA pump  Objective: Vital signs in last 24 hours: Temp:  [97.2 F (36.2 C)-99.1 F (37.3 C)] 98.9 F (37.2 C) (05/06 0355) Pulse Rate:  [76-94] 92 (05/06 0355) Resp:  [9-18] 12 (05/06 0355) BP: (135-173)/(75-119) 158/89 mmHg (05/06 0355) SpO2:  [95 %-100 %] 99 % (05/06 0355)  Intake/Output from previous day: 05/05 0701 - 05/06 0700 In: 2000 [I.V.:2000] Out: 5975 [Urine:5725; Blood:250] Intake/Output this shift:     left BKA dressing intact  Lab Results:  Recent Labs  12/10/12 0430 12/11/12 0500  WBC 8.4 10.5  HGB 11.4* 10.8*  HCT 33.8* 31.4*  PLT 195 212   BMET  Recent Labs  12/10/12 0430 12/11/12 0500  NA 131* 139  K 4.1 4.3  CL 95* 100  CO2 26 29  GLUCOSE 95 126*  BUN 16 7  CREATININE 0.82 0.68  CALCIUM 9.3 9.7    Studies/Results: No results found. Anti-infectives: Anti-infectives   Start     Dose/Rate Route Frequency Ordered Stop   12/10/12 2000  cefUROXime (ZINACEF) 1.5 g in dextrose 5 % 50 mL IVPB     1.5 g 100 mL/hr over 30 Minutes Intravenous Every 12 hours 12/10/12 1319 12/11/12 1959   12/10/12 0600  [MAR Hold]  cefUROXime (ZINACEF) 1.5 g in dextrose 5 % 50 mL IVPB     (On MAR Hold since 12/10/12 0938)   1.5 g 100 mL/hr over 30 Minutes Intravenous On call to O.R. 12/09/12 1039 12/10/12 1014   12/06/12 1700  cephALEXin (KEFLEX) capsule 500 mg     500 mg Oral 3 times per day 12/06/12 1456     12/05/12 1440  cefUROXime (ZINACEF) 1.5 g in dextrose 5 % 50 mL IVPB     1.5 g 100 mL/hr over 30 Minutes Intravenous 30 min pre-op 12/05/12 1440 12/06/12 1345      Assessment/Plan: s/p Procedure(s): AMPUTATION BELOW KNEE  (Left) Checked stump tomorrow. Today with physical therapy. Rehabilitation. Will DC PCA. Will place on home Klonopin for sleep   LOS: 5 days   EARLY, TODD 12/11/2012, 7:47 AM  Appreciate Dr Bosie Helper help. Doing well. His hyponatremia (Na = 131  yesterday) is being treated with 0.9% NaCl. F/U BMET in am.  [name], MD, FACS [facility] [phone] 12/11/2012

## 2012-12-11 NOTE — Progress Notes (Signed)
Met with patient at bedside to discuss rehabilitation needs and [facility]. Pt will need rehabilitation prior to returning home. Pt lives alone, with intermittent/minimal assistance. At this time there is no bed availability at [facility]. Informed RNCM of this and recommended SNF as a backup plan for pt's rehabilitation needs. Will follow-up in AM.  I can be reached at [phone].

## 2012-12-11 NOTE — Evaluation (Signed)
Physical Therapy Evaluation Patient Details Name: Andrew [name] MRN: [id number] DOB: 1956-04-13 Today's Date: 12/11/2012 Time: 2130-8657 PT Time Calculation (min): 49 min  PT Assessment / Plan / Recommendation Clinical Impression    Pt admitted with gangrenous Lt 2nd toe and underwent Lt toe amputation 5/1 with post-op occlusion of bypass graft. 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 Pt currently with functional limitations due to the deficits listed below (PT Problem List). Pt will benefit from skilled PT to increase their independence and safety with mobility to allow discharge home alone with limited family assist.     PT Assessment  Patient needs continued PT services    Follow Up Recommendations  CIR;Supervision for mobility/OOB    Does the patient have the potential to tolerate intense rehabilitation      Barriers to Discharge Decreased caregiver support;Inaccessible home environment      Equipment Recommendations  Wheelchair (measurements PT);Wheelchair cushion (measurements PT)    Recommendations for Other Services OT consult   Frequency Min 3X/week    Precautions / Restrictions Precautions Precautions: Fall Restrictions Weight Bearing Restrictions: No   Pertinent Vitals/Pain 7-8/10 Lt incision and Lt foot (phantom pain); encouraged use of PCA (pt refusing); RN notified; repositioned      Mobility  Bed Mobility Bed Mobility: Supine to Sit;Sitting - Scoot to Edge of Bed Supine to Sit: 5: Supervision Sitting - Scoot to Edge of Bed: 5: Supervision Details for Bed Mobility Assistance: supervison for safety and due to incr lines Transfers Transfers: Sit to Stand;Stand to Sit Sit to Stand: 4: Min assist;With upper extremity assist Stand to Sit: 4: Min assist;With upper extremity assist Details for Transfer Assistance: x2; vc for sequencing/safe use of RW Ambulation/Gait Ambulation/Gait Assistance: 4: Min assist Ambulation Distance (Feet): 2 Feet Assistive device: Rolling  walker Ambulation/Gait Assistance Details: able to fully take weight on UEs to advance RLE; flexed posture Gait Pattern: Step-to pattern;Trunk flexed    Exercises Amputee Exercises Quad Sets: AROM;Both;10 reps;Supine Hip Extension: AROM;Left;5 reps;Supine;Standing Knee Flexion: AROM;Left;5 reps;Seated Knee Extension: AROM;Left;10 reps;Seated   PT Diagnosis: Difficulty walking;Acute pain  PT Problem List: Decreased range of motion;Decreased activity tolerance;Decreased balance;Decreased mobility;Decreased knowledge of use of DME;Impaired sensation;Pain PT Treatment Interventions: DME instruction;Gait training;Functional mobility training;Therapeutic activities;Therapeutic exercise;Balance training;Patient/family education;Wheelchair mobility training   PT Goals Acute Rehab PT Goals PT Goal Formulation: With patient Time For Goal Achievement: 12/18/12 Potential to Achieve Goals: Good Pt will go Supine/Side to Sit: with modified independence PT Goal: Supine/Side to Sit - Progress: Goal set today Pt will go Sit to Stand: with supervision PT Goal: Sit to Stand - Progress: Goal set today Pt will go Stand to Sit: with supervision PT Goal: Stand to Sit - Progress: Goal set today Pt will Transfer Bed to Chair/Chair to Bed: with supervision PT Transfer Goal: Bed to Chair/Chair to Bed - Progress: Goal set today Pt will Ambulate: 51 - 150 feet;with supervision;with least restrictive assistive device PT Goal: Ambulate - Progress: Goal set today PT Goal: Up/Down Stairs - Progress: Discontinued (comment) Pt will Perform Home Exercise Program: with supervision, verbal cues required/provided (for Lt BKA, especially knee extension) PT Goal: Perform Home Exercise Program - Progress: Goal set today  Visit Information  Last PT Received On: 12/11/12 Assistance Needed: +1    Subjective Data  Patient Stated Goal: to get leg straight to be able to get a prosthesis   Prior Functioning  Home  Living Lives With: Alone Available Help at Discharge: Family (lives in addition onto brother's home, calls brother  at time) Type of Home: Other (Comment) (addition on home) Home Access: Stairs to enter (concrete blocks, wide enough for walker) Entrance Stairs-Number of Steps: 3 Entrance Stairs-Rails: None Home Layout: One level Bathroom Shower/Tub: Forensic scientist: Standard Bathroom Accessibility: Yes How Accessible: Accessible via walker (only if turn walker sideways) Home Adaptive Equipment: Tub transfer bench;Bedside commode/3-in-1;Walker - standard;Walker - rolling Prior Function Level of Independence: Independent with assistive device(s) Able to Take Stairs?: Yes Driving: Yes Comments: walking sometimes without std walker Communication Communication: No difficulties    Cognition  Cognition Arousal/Alertness: Awake/alert Behavior During Therapy: WFL for tasks assessed/performed Overall Cognitive Status: Within Functional Limits for tasks assessed    Extremity/Trunk Assessment Right Lower Extremity Assessment RLE ROM/Strength/Tone: [facility] for tasks assessed (5/5 dorsiflexion; 5/5 knee extension) RLE Sensation: Deficits;History of peripheral neuropathy RLE Coordination: WFL - gross motor Left Lower Extremity Assessment LLE ROM/Strength/Tone: Deficits LLE ROM/Strength/Tone Deficits: Lt hip ROM WFL; Lt knee extension -40, flexion 100 LLE Sensation: Deficits LLE Sensation Deficits: phantom pain--feels like his foot is bent back behind him when standing and stretching hip towards neutral (holds hip and knee in flexion) Trunk Assessment Trunk Assessment: Normal   Balance Balance Balance Assessed: Yes Static Sitting Balance Static Sitting - Balance Support: No upper extremity supported;Feet unsupported Static Sitting - Level of Assistance: 5: Stand by assistance Static Sitting - Comment/# of Minutes: loses balance posteriorly with max perturbation, able to  recover without assist Static Standing Balance Static Standing - Balance Support: Bilateral upper extremity supported Static Standing - Level of Assistance: 4: Min assist Static Standing - Comment/# of Minutes: up to 20 seconds; limited by pain/phantom pain  End of Session PT - End of Session Equipment Utilized During Treatment: Gait belt Activity Tolerance: Patient limited by pain Patient left: in chair;with call bell/phone within reach;with nursing in room Nurse Communication: Mobility status;Other (comment) (use of pillow under distal stump, NOT knee)  GP     Canden Cieslinski 12/11/2012, 10:55 AM Pager [phone]

## 2012-12-11 NOTE — Consult Note (Signed)
Physical Medicine and Rehabilitation Consult Reason for Consult: Left BKA Referring Physician: Dr. Edilia Bo   HPI: Andrew [name] is a 57 y.o. right-handed male with history of peripheral vascular disease and multiple revascularization procedures. Most recently amputation of left second toe secondary to osteomyelitis as well as thrombectomy of left femoral to peroneal bypass revision with angioplasty in early April 2014. Presented 12/06/2012 with gangrenous foot. No relief with conservative care. Underwent left below-knee amputation 12/10/2012 per Dr. Edilia Bo. Postoperative pain management. Placed on subcutaneous Lovenox for DVT prophylaxis. Physical and occupational therapy evaluations pending. M.D. is requested physical medicine rehabilitation consult to consider inpatient rehabilitation services.   Review of Systems  HENT: Positive for neck pain.   Cardiovascular: Positive for leg swelling.  Gastrointestinal:       Reflux  Musculoskeletal: Positive for myalgias and back pain.  Neurological: Positive for weakness.  Psychiatric/Behavioral: Positive for depression.       Anxiety  All other systems reviewed and are negative.   Past Medical History  Diagnosis Date  . Tobacco abuse   . 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 Manson Passey).  Pt will likely require a wide resection and  has been refered to ENT (2/12)  . Dental caries   . Glucose intolerance (impaired glucose tolerance)     Max random CBG 130 (08/29/07) typically about 100. A1C in 11/08 was 5.4.  . Peripheral vascular disease   . Hypertension, essential   . Dyslipidemia   . Anxiety   . Thoracic kyphosis   . Back pain      The patient has chronic lumbar/thoracic back pain, he has kyphosis, mild facet hypertrophy at L1-L2, circumferential annular bulging and mild vertebral body osteophytic formation at L2-L3, mild to moderate stenosis of the medial  aspect of the neural foramen at L4-L5, and mild annular bulging at L5-S1.  Marland Kitchen History of alcohol abuse   . Hyperlipidemia   . Peripheral arterial disease   . Leg pain   . Back pain   . Ulcer   . Diabetes mellitus     borderline yrs ago  . GERD (gastroesophageal reflux disease)    Past Surgical History  Procedure Laterality Date  . Carotid endarterectomy    . Penile prosthesis implant  1985    secondary to a forklift acciednt and crushed pelvis  . Pr vein bypass graft,aorto-fem-pop  2003    Dr. Madilyn Fireman  . Pr vein bypass graft,aorto-fem-pop  05/12/11    Left fem-peroneal BPG   . Bypass graft femoral-peroneal  06/12/2012    Procedure: BYPASS GRAFT FEMORAL-PERONEAL;  Surgeon: Chuck Hint, MD;  Location: [facility] OR;  Service: Vascular;  Laterality: Left;  Redo fermoral - peroneal artery bypass using     composite propaten 6mm x 80cm and left saphenous vein.  . Bypass graft femoral-peroneal Left 11/10/2012    Procedure: Thrombectomy and Revision Left FEMORAL-PERONEAL Bypass Graft;  Surgeon: Larina Earthly, MD;  Location: [facility] OR;  Service: Vascular;  Laterality: Left;  . Amputation Left 12/06/2012    Procedure: AMPUTATION DIGIT;  Surgeon: Chuck Hint, MD;  Location: [facility] OR;  Service: Vascular;  Laterality: Left;  2ND TOE   Family History  Problem Relation Age of Onset  . Heart disease Mother   . Cancer Mother   . Alcohol abuse Father   . Heart disease Father   . Stroke Father    Social  History:  reports that he has been smoking Cigarettes.  He has a 20 pack-year smoking history. He has never used smokeless tobacco. He reports that  drinks alcohol. He reports that he does not use illicit drugs. Allergies: No Known Allergies Medications Prior to Admission  Medication Sig Dispense Refill  . aspirin EC 81 MG tablet Take 81 mg by mouth daily.      . clonazePAM (KLONOPIN) 1 MG tablet Take 1 tablet (1 mg total) by mouth 3 (three) times daily as needed for anxiety.  90 tablet  3  .  enalapril (VASOTEC) 20 MG tablet Take 1 tablet (20 mg total) by mouth daily.  30 tablet  3  . hydrochlorothiazide (HYDRODIURIL) 25 MG tablet Take 1 tablet (25 mg total) by mouth daily.  30 tablet  11  . HYDROcodone-acetaminophen (NORCO) 10-325 MG per tablet Take 1 tablet by mouth every 4 (four) hours as needed.  150 tablet  5  . Multiple Vitamins-Minerals (MULTIVITAMIN WITH MINERALS) tablet Take 1 tablet by mouth daily.      Marland Kitchen omeprazole (PRILOSEC) 20 MG capsule Take 1 capsule (20 mg total) by mouth daily.  30 capsule  11  . oxyCODONE-acetaminophen (PERCOCET/ROXICET) 5-325 MG per tablet Take 2 tablets by mouth every 4 (four) hours as needed for pain.      Marland Kitchen sertraline (ZOLOFT) 100 MG tablet Take 1 tablet (100 mg total) by mouth daily.  30 tablet  11  . simvastatin (ZOCOR) 40 MG tablet Take 1 tablet (40 mg total) by mouth at bedtime.  30 tablet  11  . traMADol (ULTRAM) 50 MG tablet Take 50 mg by mouth every 6 (six) hours as needed for pain.      . varenicline (CHANTIX) 1 MG tablet Take 1 mg by mouth 2 (two) times daily.      . Vitamins-Lipotropics (B COMPLEX FORMULA 1 PO) Take 1 tablet by mouth daily.        . [DISCONTINUED] metoprolol (LOPRESSOR) 50 MG tablet Take 50 mg by mouth 2 (two) times daily.        Home:    Functional History:   Functional Status:  Mobility:          ADL:    Cognition: Cognition Orientation Level: Oriented X4    Blood pressure 158/89, pulse 92, temperature 98.9 F (37.2 C), temperature source Oral, resp. rate 12, height 5\' 11"  (1.803 m), weight 66.2 kg (145 lb 15.1 oz), SpO2 99.00%. Physical Exam  Vitals reviewed. Constitutional: He is oriented to person, place, and time.  57 year old male appearing older than stated age  HENT:  Head: Normocephalic.  Eyes: EOM are normal.  Neck: Neck supple. Thyromegaly present.  Cardiovascular: Normal rate and regular rhythm.   Pulmonary/Chest: Effort normal and breath sounds normal. No respiratory distress.   Abdominal: Soft. Bowel sounds are normal. He exhibits no distension.  Neurological: He is alert and oriented to person, place, and time.  Patient was able name person place as well as date of birth but somewhat slow to process. He was appropriate during exam with flat affect. UE's grossly 4/5. Left HF antigravity. RLE is grossly 3to 4/5. Senses pain and light touch in all 4.  Skin:   Left below knee amputation site is dressed  Psychiatric: He has a normal mood and affect. His behavior is normal.    Results for orders placed during the hospital encounter of 12/06/12 (from the past 24 hour(s))  SURGICAL PCR SCREEN     Status: None  Collection Time    12/10/12  9:00 AM      Result Value Range   MRSA, PCR NEGATIVE  NEGATIVE   Staphylococcus aureus NEGATIVE  NEGATIVE  CBC     Status: Abnormal   Collection Time    12/11/12  5:00 AM      Result Value Range   WBC 10.5  4.0 - 10.5 K/uL   RBC 3.70 (*) 4.22 - 5.81 MIL/uL   Hemoglobin 10.8 (*) 13.0 - 17.0 g/dL   HCT 32.2 (*) 02.5 - 42.7 %   MCV 84.9  78.0 - 100.0 fL   MCH 29.2  26.0 - 34.0 pg   MCHC 34.4  30.0 - 36.0 g/dL   RDW 06.2  37.6 - 28.3 %   Platelets 212  150 - 400 K/uL   No results found.  Assessment/Plan: Diagnosis: left BKA 1. Does the need for close, 24 hr/day medical supervision in concert with the patient's rehab needs make it unreasonable for this patient to be served in a less intensive setting? Yes 2. Co-Morbidities requiring supervision/potential complications: anxiety, htn 3. Due to bladder management, bowel management, safety, skin/wound care, disease management, medication administration, pain management and patient education, does the patient require 24 hr/day rehab nursing? Yes 4. Does the patient require coordinated care of a physician, rehab nurse, PT (1-2 hrs/day, 5 days/week) and OT (1-2 hrs/day, 5 days/week) to address physical and functional deficits in the context of the above medical diagnosis(es)?  Yes Addressing deficits in the following areas: balance, endurance, locomotion, strength, transferring, bowel/bladder control, bathing, dressing, feeding, grooming, toileting and psychosocial support 5. Can the patient actively participate in an intensive therapy program of at least 3 hrs of therapy per day at least 5 days per week? Yes 6. The potential for patient to make measurable gains while on inpatient rehab is excellent 7. Anticipated functional outcomes upon discharge from inpatient rehab are w/c mod I with PT, w/c mod I with OT, n/a with SLP. 8. Estimated rehab length of stay to reach the above functional goals is: ?7-10 days 9. Does the patient have adequate social supports to accommodate these discharge functional goals? Yes 10. Anticipated D/C setting: Home 11. Anticipated post D/C treatments: HH therapy 12. Overall Rehab/Functional Prognosis: excellent  RECOMMENDATIONS: This patient's condition is appropriate for continued rehabilitative care in the following setting: CIR Patient has agreed to participate in recommended program. Yes Note that insurance prior authorization may be required for reimbursement for recommended care.  Comment: Therapy evals are pending, but I would anticipate that he would be appropriate for our inpatient rehab program.  Ranelle Oyster, MD, [facility]     12/11/2012

## 2012-12-11 NOTE — Care Management Note (Signed)
    Page 1 of 1   12/13/2012     4:20:47 PM   CARE MANAGEMENT NOTE 12/13/2012  Patient:  Andrew [name], Andrew [name]   Account Number:  [id number]  Date Initiated:  12/11/2012  Documentation initiated by:  Kela Baccari  Subjective/Objective Assessment:   PT WITH PVD S/P LT BKA ON 12/10/12.  PTA, PT LIVES AT HOME ALONE.     Action/Plan:   P.T. RECOMMENDING CIR, HOWEVER CURRENTLY NO BEDS AVAILABLE AND NONE ANTICIPATED.  WILL CONSULT CSW TO FACILITATE LIKELY DC TO SNF FOR REHAB.  WILL FOLLOW.   Anticipated DC Date:  12/13/2012   Anticipated DC Plan:  IP REHAB FACILITY  In-house referral  Clinical Social Worker      DC Planning Services  CM consult      Choice offered to / List presented to:             Status of service:  Completed, signed off Medicare Important Message given?   (If response is "NO", the following Medicare IM given date fields will be blank) Date Medicare IM given:   Date Additional Medicare IM given:    Discharge Disposition:  IP REHAB FACILITY  Per UR Regulation:  Reviewed for med. necessity/level of care/duration of stay  If discussed at Long Length of Stay Meetings, dates discussed:    Comments:  12/13/12 Emilia Kayes,RN,BSN [phone] PT FOR DC TO [facility]  IP REHAB TODAY.

## 2012-12-11 NOTE — Progress Notes (Signed)
Occupational Therapy Evaluation Patient Details Name: Andrew [name] MRN: [id number] DOB: April 23, 1956 Today's Date: 12/11/2012 Time: 4010-2725 OT Time Calculation (min): 25 min  OT Assessment / Plan / Recommendation Clinical Impression  Patient presents to OT s/p L BKA with decreased ADL independence and safety. Patient will benefit from acute OT to maximize independence.    OT Assessment  Patient needs continued OT Services    Follow Up Recommendations  CIR;SNF    Barriers to Discharge Decreased caregiver support;Inaccessible home environment    Equipment Recommendations  Other (comment) (TBD next venue)    Recommendations for Other Services Rehab consult  Frequency  Min 2X/week    Precautions / Restrictions Precautions Precautions: Fall Restrictions Weight Bearing Restrictions: No   Pertinent Vitals/Pain     ADL  Eating/Feeding: Independent Where Assessed - Eating/Feeding: Chair Grooming: Performed;Wash/dry hands;Wash/dry face;Modified independent Where Assessed - Grooming: Unsupported sitting Upper Body Bathing: Simulated;Set up Where Assessed - Upper Body Bathing: Unsupported sitting Lower Body Bathing: Minimal assistance Where Assessed - Lower Body Bathing: Lean right and/or left Upper Body Dressing: Simulated;Set up Where Assessed - Upper Body Dressing: Unsupported sitting Lower Body Dressing: Simulated;Moderate assistance;Minimal assistance Where Assessed - Lower Body Dressing: Lean right and/or left Toilet Transfer: Simulated;Minimal assistance Toilet Transfer Method: Stand pivot Toilet Transfer Equipment: Bedside commode Toileting - Clothing Manipulation and Hygiene: Simulated;Minimal assistance Where Assessed - Engineer, mining and Hygiene: Lean right and/or left Transfers/Ambulation Related to ADLs: Patient performs transfers with RW and min A. ADL Comments: Patient with good flexibility to R foot and can don/doff sock. Good mobility leaning  left/right. Reports phantom sensations L residual limb.    OT Diagnosis: Acute pain;Generalized weakness  OT Problem List: Decreased strength;Impaired balance (sitting and/or standing);Decreased knowledge of use of DME or AE;Pain OT Treatment Interventions: Self-care/ADL training;Therapeutic exercise;DME and/or AE instruction;Therapeutic activities;Patient/family education   OT Goals Acute Rehab OT Goals OT Goal Formulation: With patient Time For Goal Achievement: 12/25/12 Potential to Achieve Goals: Good ADL Goals Pt Will Perform Lower Body Bathing: with modified independence;Sitting, chair;Sitting, edge of bed ADL Goal: Lower Body Bathing - Progress: Goal set today Pt Will Perform Lower Body Dressing: with modified independence;Sitting, chair;Sitting, bed ADL Goal: Lower Body Dressing - Progress: Goal set today Pt Will Transfer to Toilet: with modified independence;3-in-1;Stand pivot transfer ADL Goal: Toilet Transfer - Progress: Goal set today Pt Will Perform Toileting - Clothing Manipulation: with modified independence;Sitting on 3-in-1 or toilet ADL Goal: Toileting - Clothing Manipulation - Progress: Goal set today Pt Will Perform Toileting - Hygiene: with modified independence;Sitting on 3-in-1 or toilet ADL Goal: Toileting - Hygiene - Progress: Goal set today Arm Goals Additional Arm Goal #1: Patient will be independent with BUE strengthening HEP. Arm Goal: Additional Goal #1 - Progress: Goal set today  Visit Information  Last OT Received On: 12/11/12 Assistance Needed: +1    Subjective Data  Subjective: "I live in a room in the back of my brother's house." Patient Stated Goal: Rehab then home   Prior Functioning     Home Living Lives With: Alone Available Help at Discharge: Family (lives in addition onto brother's home, calls brother at time) Type of Home: Other (Comment) (addition on home) Home Access: Stairs to enter (concrete blocks, wide enough for  walker) Entrance Stairs-Number of Steps: 3 Entrance Stairs-Rails: None Home Layout: One level Bathroom Shower/Tub: Forensic scientist: Standard Bathroom Accessibility: Yes How Accessible: Accessible via walker;Other (comment) (only if turn walker sideways) Home Adaptive Equipment: Tub transfer bench;Bedside  commode/3-in-1;Walker - standard;Walker - rolling Prior Function Level of Independence: Independent with assistive device(s) Able to Take Stairs?: Yes Driving: Yes Vocation: On disability Communication Communication: No difficulties Dominant Hand: Right         Vision/Perception Vision - History Baseline Vision: Wears glasses all the time Patient Visual Report: No change from baseline   Cognition  Cognition Arousal/Alertness: Awake/alert Behavior During Therapy: WFL for tasks assessed/performed Overall Cognitive Status: Within Functional Limits for tasks assessed    Extremity/Trunk Assessment Right Upper Extremity Assessment RUE ROM/Strength/Tone: Within functional levels Left Upper Extremity Assessment LUE ROM/Strength/Tone: Within functional levels     End of Session OT - End of Session Equipment Utilized During Treatment: Gait belt (RW) Activity Tolerance: Patient tolerated treatment well Patient left: in bed;with call bell/phone within reach  GO     Andrew [name] A 12/11/2012, 5:31 PM

## 2012-12-11 NOTE — Anesthesia Postprocedure Evaluation (Signed)
  Anesthesia Post-op Note  Patient: Andrew [name]  Procedure(s) Performed: Procedure(s): AMPUTATION BELOW KNEE  (Left)  Patient Location: Nursing Unit  Anesthesia Type:General  Level of Consciousness: awake, alert , oriented and patient cooperative  Airway and Oxygen Therapy: Patient Spontanous Breathing and Patient connected to nasal cannula oxygen  Post-op Pain: moderate  Post-op Assessment: Post-op Vital signs reviewed  Post-op Vital Signs: stable  Complications: No apparent anesthesia complications

## 2012-12-12 LAB — BASIC METABOLIC PANEL
CO2: 25 mEq/L (ref 19–32)
Glucose, Bld: 127 mg/dL — ABNORMAL HIGH (ref 70–99)
Potassium: 3.6 mEq/L (ref 3.5–5.1)
Sodium: 127 mEq/L — ABNORMAL LOW (ref 135–145)

## 2012-12-12 LAB — CBC
Hemoglobin: 10 g/dL — ABNORMAL LOW (ref 13.0–17.0)
MCH: 28.8 pg (ref 26.0–34.0)
MCV: 83.3 fL (ref 78.0–100.0)
RBC: 3.47 MIL/uL — ABNORMAL LOW (ref 4.22–5.81)

## 2012-12-12 NOTE — Progress Notes (Signed)
Orthopedic Tech Progress Note Patient Details:  Andrew [name] [birth year] [id number]  Patient ID: [name], male   DOB: 1956-02-14, 57 y.o.   MRN: [id number]   Shawnie Pons 12/12/2012, 12:10 PM Left BKA stump shrinker .

## 2012-12-12 NOTE — Clinical Social Work Note (Addendum)
Clinical Social Work Department CLINICAL SOCIAL WORK PLACEMENT NOTE 12/12/2012  Patient:  Andrew [name], Andrew [name]  Account Number:  [id number] Admit date:  12/06/2012  Clinical Social Worker:  [name], LCSW  Date/time:  12/11/2012 05:00 PM  Clinical Social Work is seeking post-discharge placement for this patient at the following level of care:   SKILLED NURSING   (*CSW will update this form in Epic as items are completed)   12/12/2012  Patient/family provided with [facility] Department of Clinical Social Work's list of facilities offering this level of care within the geographic area requested by the patient (or if unable, by the patient's family).  12/12/2012  Patient/family informed of their freedom to choose among providers that offer the needed level of care, that participate in Medicare, Medicaid or managed care program needed by the patient, have an available bed and are willing to accept the patient.  12/12/2012  Patient/family informed of [facility]' ownership interest in [facility], as well as of the fact that they are under no obligation to receive care at this facility.  PASARR submitted to EDS on 12/12/2012 PASARR number received from EDS on 12/12/2012  FL2 transmitted to all facilities in geographic area requested by pt/family on  12/12/2012 FL2 transmitted to all facilities within larger geographic area on   Patient informed that his/her managed care company has contracts with or will negotiate with  certain facilities, including the following:     Patient/family informed of bed offers received:  12/12/2012 Patient chooses bed at  [facility] Physician recommends and patient chooses bed at    Patient to be transferred to Inpatient Rehab on 12/13/2012   Patient to be transferred to facility by   The following physician request were entered in Epic:   Additional Comments:  05/07 Patient requesting [facility] - phone call placed to question possible bed  offer.  Await return call.

## 2012-12-12 NOTE — Clinical Social Work Note (Signed)
Clinical Social Worker met with patient at bedside to provide bed offers. Patient states that per MD, patient would be more appropriate for inpatient rehab vs. SNF.  Patient under the impression that MD would be contacting inpatient rehab to request continued follow up.  CSW spoke with inpatient rehab admissions coordinator who will continue to follow patient if a bed becomes available.  Patient remains agreeable with SNF as a back up plan.  Patient states that he has spoken with a dear friend who has recommended [facility] and that is patient first choice at this time.  CSW has contacted [place] and left a message regarding potential bed offer.  CSW contacted patient mother to update per patient request.  CSW remains available for support and to facilitate patient discharge needs.  Andrew [name], [place] [phone]

## 2012-12-12 NOTE — Progress Notes (Signed)
CIR bed not available at this time and not anticipated for tomorrow. Will continue to follow patient and will inform team if bed becomes available. [phone]

## 2012-12-12 NOTE — Progress Notes (Addendum)
[facility] [place]  Postoperative Visit - Amputation  Date of Surgery: 12/06/2012 - 12/10/2012 Procedure(s): AMPUTATION BELOW KNEE  Left Surgeon: Surgeon(s): Larina Earthly, MD POD: 2 Days Post-Op  Subjective Andrew [name] is a 57 y.o. male who is S/P Left Procedure(s): AMPUTATION BELOW KNEE .  Pt.denies increased pain in the stump. The patient notes pain is well controlled. Pt. reports phantom pain.  Significant Diagnostic Studies: CBC Lab Results  Component Value Date   WBC 11.2* 12/12/2012   HGB 10.0* 12/12/2012   HCT 28.9* 12/12/2012   MCV 83.3 12/12/2012   PLT 241 12/12/2012    BMET    Component Value Date/Time   NA 127* 12/12/2012 0546   K 3.6 12/12/2012 0546   CL 91* 12/12/2012 0546   CO2 25 12/12/2012 0546   GLUCOSE 127* 12/12/2012 0546   BUN 6 12/12/2012 0546   CREATININE 0.56 12/12/2012 0546   CALCIUM 9.3 12/12/2012 0546   GFRNONAA >90 12/12/2012 0546   GFRAA >90 12/12/2012 0546    COAG Lab Results  Component Value Date   INR 0.93 12/10/2012   INR 0.98 12/06/2012   INR 0.88 11/09/2012   No results found for this basename: PTT     Intake/Output Summary (Last 24 hours) at 12/12/12 0759 Last data filed at 12/12/12 0443  Gross per 24 hour  Intake    480 ml  Output   3850 ml  Net  -3370 ml   No data found.    Physical Examination  BP Readings from Last 3 Encounters:  12/12/12 139/81  12/12/12 139/81  12/12/12 139/81   Temp Readings from Last 3 Encounters:  12/12/12 98.4 F (36.9 C) Oral  12/12/12 98.4 F (36.9 C) Oral  12/12/12 98.4 F (36.9 C) Oral   SpO2 Readings from Last 3 Encounters:  12/12/12 98%  12/12/12 98%  12/12/12 98%   Pulse Readings from Last 3 Encounters:  12/12/12 94  12/12/12 94  12/12/12 94    Pt is A&Ox3  WDWN male with no complaints  Left amputation wound is healing well.  There is good bone coverage in the stump Stump is warm and well perfused, without drainage; without erythema   Assessment/plan:  Andrew [name] is a 57 y.o. male who is s/p Left Procedure(s): AMPUTATION BELOW KNEE   The patient's stump is viable.  He lives alone and will go to SNF for rehab  Follow-up 4 weeks from surgery  Hyponatremia 127 today will order gator aide TID   COLLINS, EMMA MAUREEN 7:59 AM 12/12/2012   I have examined the patient, reviewed and agree with above.  Kirstin Kugler, MD 12/12/2012 2:44 PM

## 2012-12-12 NOTE — Progress Notes (Signed)
Physical Therapy Treatment Patient Details Name: Andrew [name] MRN: [id number] DOB: Nov 16, 1955 Today's Date: 12/12/2012 Time: 7829-5621 PT Time Calculation (min): 26 min  PT Assessment / Plan / Recommendation Comments on Treatment Session   Pt making progress with mobility at this date but requires Min (A) for OOB mobility.  Cont to recommend CIR at d/c to maximize independence with functional mobility & safety.         Follow Up Recommendations  CIR;Supervision for mobility/OOB     Does the patient have the potential to tolerate intense rehabilitation     Barriers to Discharge        Equipment Recommendations  Wheelchair (measurements PT);Wheelchair cushion (measurements PT)    Recommendations for Other Services    Frequency Min 3X/week   Plan Discharge plan remains appropriate    Precautions / Restrictions Precautions Precautions: Fall Restrictions Weight Bearing Restrictions: No       Mobility  Bed Mobility Bed Mobility: Supine to Sit;Sitting - Scoot to Edge of Bed Supine to Sit: 6: Modified independent (Device/Increase time);HOB flat Sitting - Scoot to Edge of Bed: 6: Modified independent (Device/Increase time) Transfers Transfers: Sit to Stand;Stand to [employer] Transfers Sit to Stand: 4: Min assist;With upper extremity assist;With armrests;From bed;From chair/3-in-1 Stand to Sit: 4: Min assist;With upper extremity assist;With armrests;To chair/3-in-1 Stand Pivot Transfers: 4: Min assist Details for Transfer Assistance: Cues for hand placement & technique.  (A) for balance, safe use of RW with SPT, cues for body positioning before sitting,& technique.  Pt jerking RW around legs of 3-in-1 with SPT.    Ambulation/Gait Ambulation/Gait Assistance: 4: Min assist Ambulation Distance (Feet): 16 Feet Assistive device: Rolling walker Ambulation/Gait Assistance Details: Cues for safe use of RW, not to jerk RW around when navigating around obstables/between tight  spaces, cues to ensure balance before taking next hop.  (A) for balance & safety.   Gait Pattern: Step-to pattern Stairs: No Wheelchair Mobility Wheelchair Mobility: No    Exercises Amputee Exercises Quad Sets: AROM;Both;10 reps;Supine Hip ABduction/ADduction: AROM;Strengthening;Left;10 reps Straight Leg Raises: AROM;Strengthening;Left;10 reps    PT Goals Acute Rehab PT Goals Time For Goal Achievement: 12/18/12 Potential to Achieve Goals: Good Pt will go Supine/Side to Sit: with modified independence PT Goal: Supine/Side to Sit - Progress: Met Pt will go Sit to Stand: with supervision PT Goal: Sit to Stand - Progress: Progressing toward goal Pt will go Stand to Sit: with supervision PT Goal: Stand to Sit - Progress: Progressing toward goal Pt will Transfer Bed to Chair/Chair to Bed: with supervision PT Transfer Goal: Bed to Chair/Chair to Bed - Progress: Progressing toward goal Pt will Ambulate: 51 - 150 feet;with supervision;with least restrictive assistive device PT Goal: Ambulate - Progress: Progressing toward goal Pt will Go Up / Down Stairs: 3-5 stairs;with modified independence;with least restrictive assistive device Pt will Perform Home Exercise Program: with supervision, verbal cues required/provided PT Goal: Perform Home Exercise Program - Progress: Progressing toward goal  Visit Information  Last PT Received On: 12/12/12 Assistance Needed: +1    Subjective Data  Subjective: "I would love to go home" Patient Stated Goal: to get leg straight to be able to get a prosthesis   Cognition  Cognition Arousal/Alertness: Awake/alert Behavior During Therapy: WFL for tasks assessed/performed Overall Cognitive Status: Within Functional Limits for tasks assessed (? if pt completely aware of (A) he will need at d/c.  )    Balance     End of Session PT - End of Session Equipment  Utilized During Treatment: Gait belt Activity Tolerance: Patient tolerated treatment  well Patient left: in chair;with call bell/phone within reach Nurse Communication: Mobility status     Verdell Face, [place] [phone] 12/12/2012

## 2012-12-12 NOTE — Clinical Social Work Note (Signed)
Clinical Social Work Department BRIEF PSYCHOSOCIAL ASSESSMENT 12/12/2012  Patient:  Andrew [name], Andrew [name]     Account Number:  [id number]     Admit date:  12/06/2012  Clinical Social Worker:  Verl Blalock  Date/Time:  12/11/2012 05:00 PM  Referred by:  Care Management  Date Referred:  12/11/2012 Referred for  SNF Placement   Other Referral:   Interview type:  Patient Other interview type:   No family present at bedside    PSYCHOSOCIAL DATA Living Status:  ALONE Admitted from facility:   Level of care:   Primary support name:  Frances Nickels  [phone] Primary support relationship to patient:  PARENT Degree of support available:   Strong emotionally but unable to assist physically    CURRENT CONCERNS Current Concerns  Post-Acute Placement   Other Concerns:    SOCIAL WORK ASSESSMENT / PLAN Clinical Social Worker met with patient at bedside to offer support and discuss patient needs at discharge.  Patient states that he lives alone but does have minimal assistance from his mother and sister.  Patient understands that with new amputation, he will require extensive assistance prior to return home alone.  Patient is not happy but accepting of SNF placement at this time.  CSW initiated referral in [facility] and will follow up with patient regarding potential bed offers.  Patient is not yet willing to committ to placement but willing to explore facility options - with encouragement CSW feels patient would be willing.  CSW remains available for support and to facilitate patient discharge needs once medically stable.   Assessment/plan status:  Psychosocial Support/Ongoing Assessment of Needs Other assessment/ plan:   Information/referral to community resources:   Clinical Social Worker offered patient facility list, however he asked to only be provided once available beds were marked.    PATIENT'S/FAMILY'S RESPONSE TO PLAN OF CARE: Patient alert and oriented x3 sitting up in  bed speaking with Occupational Therapist upon CSW arrival.  Patient seems to be dealing well with the amputation but with skewed perceptions of independent living.  Patient with minimal family support physically but emotionally have been providing patient with strong support.  Patient verbalized his appreciation for CSW support and concerns.    [name], [place] [phone]

## 2012-12-12 NOTE — Progress Notes (Signed)
Orthopedic Tech Progress Note Patient Details:  Andrew [name] 1955/12/19 [id number]  Patient ID: [name], male   DOB: 22-May-1956, 57 y.o.   MRN: [id number]   Shawnie Pons 12/12/2012, 10:32 AM Called bio-tech for left BKA .

## 2012-12-13 ENCOUNTER — Inpatient Hospital Stay (HOSPITAL_COMMUNITY)
Admission: RE | Admit: 2012-12-13 | Discharge: 2012-12-21 | DRG: 945 | Disposition: A | Discharge status: Home Health | Payer: Medicare Other | Source: Intra-hospital | Attending: Physical Medicine & Rehabilitation | Admitting: Physical Medicine & Rehabilitation

## 2012-12-13 DIAGNOSIS — I10 Essential (primary) hypertension: Secondary | ICD-10-CM

## 2012-12-13 DIAGNOSIS — Z89519 Acquired absence of unspecified leg below knee: Secondary | ICD-10-CM

## 2012-12-13 DIAGNOSIS — F3289 Other specified depressive episodes: Secondary | ICD-10-CM

## 2012-12-13 DIAGNOSIS — E785 Hyperlipidemia, unspecified: Secondary | ICD-10-CM

## 2012-12-13 DIAGNOSIS — Z89512 Acquired absence of left leg below knee: Secondary | ICD-10-CM

## 2012-12-13 DIAGNOSIS — S88119A Complete traumatic amputation at level between knee and ankle, unspecified lower leg, initial encounter: Secondary | ICD-10-CM

## 2012-12-13 DIAGNOSIS — F1010 Alcohol abuse, uncomplicated: Secondary | ICD-10-CM

## 2012-12-13 DIAGNOSIS — D62 Acute posthemorrhagic anemia: Secondary | ICD-10-CM

## 2012-12-13 DIAGNOSIS — K219 Gastro-esophageal reflux disease without esophagitis: Secondary | ICD-10-CM

## 2012-12-13 DIAGNOSIS — Z8249 Family history of ischemic heart disease and other diseases of the circulatory system: Secondary | ICD-10-CM

## 2012-12-13 DIAGNOSIS — F329 Major depressive disorder, single episode, unspecified: Secondary | ICD-10-CM

## 2012-12-13 DIAGNOSIS — G547 Phantom limb syndrome without pain: Secondary | ICD-10-CM

## 2012-12-13 DIAGNOSIS — Z5189 Encounter for other specified aftercare: Principal | ICD-10-CM

## 2012-12-13 DIAGNOSIS — G8918 Other acute postprocedural pain: Secondary | ICD-10-CM

## 2012-12-13 DIAGNOSIS — I70269 Atherosclerosis of native arteries of extremities with gangrene, unspecified extremity: Secondary | ICD-10-CM

## 2012-12-13 DIAGNOSIS — I739 Peripheral vascular disease, unspecified: Secondary | ICD-10-CM

## 2012-12-13 DIAGNOSIS — F411 Generalized anxiety disorder: Secondary | ICD-10-CM

## 2012-12-13 DIAGNOSIS — L98499 Non-pressure chronic ulcer of skin of other sites with unspecified severity: Secondary | ICD-10-CM

## 2012-12-13 DIAGNOSIS — Z823 Family history of stroke: Secondary | ICD-10-CM

## 2012-12-13 DIAGNOSIS — G4700 Insomnia, unspecified: Secondary | ICD-10-CM

## 2012-12-13 DIAGNOSIS — Z7982 Long term (current) use of aspirin: Secondary | ICD-10-CM

## 2012-12-13 DIAGNOSIS — Z79899 Other long term (current) drug therapy: Secondary | ICD-10-CM

## 2012-12-13 DIAGNOSIS — F17200 Nicotine dependence, unspecified, uncomplicated: Secondary | ICD-10-CM

## 2012-12-13 DIAGNOSIS — Z6379 Other stressful life events affecting family and household: Secondary | ICD-10-CM

## 2012-12-13 LAB — URINALYSIS, ROUTINE W REFLEX MICROSCOPIC
Glucose, UA: NEGATIVE mg/dL
Leukocytes, UA: NEGATIVE
Nitrite: NEGATIVE
Protein, ur: NEGATIVE mg/dL
pH: 7 (ref 5.0–8.0)

## 2012-12-13 LAB — CBC
HCT: 30 % — ABNORMAL LOW (ref 39.0–52.0)
MCHC: 33 g/dL (ref 30.0–36.0)
MCV: 85.7 fL (ref 78.0–100.0)
RDW: 13.7 % (ref 11.5–15.5)

## 2012-12-13 LAB — CREATININE, SERUM
Creatinine, Ser: 0.63 mg/dL (ref 0.50–1.35)
GFR calc Af Amer: >90 mL/min (ref >90)
GFR calc Af Amer: >90 mL/min (ref >90)

## 2012-12-13 MED ORDER — SERTRALINE HCL 100 MG PO TABS
100.0000 mg | ORAL_TABLET | Freq: Every day | ORAL | Status: DC
  Administered 2012-12-14 – 2012-12-21 (×8): 100 mg via ORAL
  Filled 2012-12-13 (×9): qty 1

## 2012-12-13 MED ORDER — ADULT MULTIVITAMIN W/MINERALS CH
1.0000 | ORAL_TABLET | Freq: Every day | ORAL | Status: DC
  Administered 2012-12-14 – 2012-12-21 (×8): 1 via ORAL
  Filled 2012-12-13 (×9): qty 1

## 2012-12-13 MED ORDER — CEPHALEXIN 500 MG PO CAPS
500.0000 mg | ORAL_CAPSULE | Freq: Three times a day (TID) | ORAL | Status: DC
  Administered 2012-12-13 – 2012-12-21 (×24): 500 mg via ORAL
  Filled 2012-12-13 (×26): qty 1

## 2012-12-13 MED ORDER — PANTOPRAZOLE SODIUM 40 MG PO TBEC
40.0000 mg | DELAYED_RELEASE_TABLET | Freq: Every day | ORAL | Status: DC
  Administered 2012-12-14 – 2012-12-21 (×8): 40 mg via ORAL
  Filled 2012-12-13 (×10): qty 1

## 2012-12-13 MED ORDER — OXYCODONE-ACETAMINOPHEN 5-325 MG PO TABS
2.0000 | ORAL_TABLET | ORAL | Status: DC | PRN
Start: 2012-12-13 — End: 2012-12-13

## 2012-12-13 MED ORDER — ONDANSETRON HCL 4 MG PO TABS: 4.0000 mg | ORAL_TABLET | Freq: Four times a day (QID) | ORAL | Status: DC | PRN

## 2012-12-13 MED ORDER — ACETAMINOPHEN 325 MG PO TABS
325.0000 mg | ORAL_TABLET | ORAL | Status: DC | PRN
  Filled 2012-12-13: qty 2

## 2012-12-13 MED ORDER — ENOXAPARIN SODIUM 40 MG/0.4ML ~~LOC~~ SOLN
40.0000 mg | SUBCUTANEOUS | Status: DC
  Administered 2012-12-14 – 2012-12-21 (×8): 40 mg via SUBCUTANEOUS
  Filled 2012-12-13 (×8): qty 0.4

## 2012-12-13 MED ORDER — OXYCODONE-ACETAMINOPHEN 5-325 MG PO TABS
2.0000 | ORAL_TABLET | ORAL | Status: DC | PRN
  Administered 2012-12-13 – 2012-12-16 (×17): 2 via ORAL
  Filled 2012-12-13 (×18): qty 2

## 2012-12-13 MED ORDER — ENOXAPARIN SODIUM 40 MG/0.4ML ~~LOC~~ SOLN: 40.0000 mg | SUBCUTANEOUS | Status: DC

## 2012-12-13 MED ORDER — HYDROCHLOROTHIAZIDE 25 MG PO TABS
25.0000 mg | ORAL_TABLET | Freq: Every day | ORAL | Status: DC
  Administered 2012-12-14 – 2012-12-21 (×8): 25 mg via ORAL
  Filled 2012-12-13 (×9): qty 1

## 2012-12-13 MED ORDER — ACETAMINOPHEN 650 MG RE SUPP: 325.0000 mg | RECTAL | Status: DC | PRN

## 2012-12-13 MED ORDER — ONDANSETRON HCL 4 MG/2ML IJ SOLN: 4.0000 mg | Freq: Four times a day (QID) | INTRAMUSCULAR | Status: DC | PRN

## 2012-12-13 MED ORDER — SENNOSIDES-DOCUSATE SODIUM 8.6-50 MG PO TABS
1.0000 | ORAL_TABLET | Freq: Every evening | ORAL | Status: DC | PRN
  Administered 2012-12-17: 1 via ORAL
  Filled 2012-12-13: qty 1

## 2012-12-13 MED ORDER — SIMVASTATIN 40 MG PO TABS
40.0000 mg | ORAL_TABLET | Freq: Every day | ORAL | Status: DC
  Administered 2012-12-13 – 2012-12-20 (×8): 40 mg via ORAL
  Filled 2012-12-13 (×9): qty 1

## 2012-12-13 MED ORDER — CLONAZEPAM 0.5 MG PO TABS
1.0000 mg | ORAL_TABLET | Freq: Every day | ORAL | Status: DC | PRN
  Administered 2012-12-15 – 2012-12-17 (×2): 1 mg via ORAL
  Filled 2012-12-13 (×5): qty 2

## 2012-12-13 MED ORDER — ENALAPRIL MALEATE 20 MG PO TABS
20.0000 mg | ORAL_TABLET | Freq: Every day | ORAL | Status: DC
  Administered 2012-12-14 – 2012-12-21 (×8): 20 mg via ORAL
  Filled 2012-12-13 (×9): qty 1

## 2012-12-13 MED ORDER — SORBITOL 70 % SOLN
30.0000 mL | Freq: Every day | Status: DC | PRN
  Filled 2012-12-13: qty 30

## 2012-12-13 MED ORDER — ASPIRIN EC 81 MG PO TBEC
81.0000 mg | DELAYED_RELEASE_TABLET | Freq: Every day | ORAL | Status: DC
  Administered 2012-12-14 – 2012-12-21 (×8): 81 mg via ORAL
  Filled 2012-12-13 (×9): qty 1

## 2012-12-13 MED ORDER — VARENICLINE TARTRATE 1 MG PO TABS
1.0000 mg | ORAL_TABLET | Freq: Two times a day (BID) | ORAL | Status: DC
  Administered 2012-12-13 – 2012-12-21 (×16): 1 mg via ORAL
  Filled 2012-12-13 (×18): qty 1

## 2012-12-13 NOTE — Discharge Summary (Signed)
Agree with plans for D/C.  [name], MD, FACS Beeper [phone] 12/13/2012

## 2012-12-13 NOTE — Progress Notes (Addendum)
[facility] [place]  Postoperative Visit - Amputation  Date of Surgery: 12/06/2012 - 12/10/2012 Procedure(s): AMPUTATION BELOW KNEE  Right Surgeon: Surgeon(s): Larina Earthly, MD POD: 3 Days Post-Op  Subjective Andrew [name] is a 57 y.o. male who is S/P Right Procedure(s): AMPUTATION BELOW KNEE .  Pt.denies increased pain in the stump. The patient notes pain is well controlled. Pt. denies phantom pain.  Significant Diagnostic Studies: CBC Lab Results  Component Value Date   WBC 11.2* 12/12/2012   HGB 10.0* 12/12/2012   HCT 28.9* 12/12/2012   MCV 83.3 12/12/2012   PLT 241 12/12/2012    BMET    Component Value Date/Time   NA 127* 12/12/2012 0546   K 3.6 12/12/2012 0546   CL 91* 12/12/2012 0546   CO2 25 12/12/2012 0546   GLUCOSE 127* 12/12/2012 0546   BUN 6 12/12/2012 0546   CREATININE 0.63 12/13/2012 0500   CALCIUM 9.3 12/12/2012 0546   GFRNONAA >90 12/13/2012 0500   GFRAA >90 12/13/2012 0500    COAG Lab Results  Component Value Date   INR 0.93 12/10/2012   INR 0.98 12/06/2012   INR 0.88 11/09/2012   No results found for this basename: PTT     Intake/Output Summary (Last 24 hours) at 12/13/12 0832 Last data filed at 12/13/12 0508  Gross per 24 hour  Intake    603 ml  Output   2800 ml  Net  -2197 ml   No data found.    Physical Examination  BP Readings from Last 3 Encounters:  12/13/12 138/80  12/13/12 138/80  12/13/12 138/80   Temp Readings from Last 3 Encounters:  12/13/12 98.4 F (36.9 C) Oral  12/13/12 98.4 F (36.9 C) Oral  12/13/12 98.4 F (36.9 C) Oral   SpO2 Readings from Last 3 Encounters:  12/13/12 100%  12/13/12 100%  12/13/12 100%   Pulse Readings from Last 3 Encounters:  12/13/12 82  12/13/12 82  12/13/12 82    Pt is A&Ox3  WDWN male with no complaints  Right amputation wound is clean, dry, no drainage and healing well.  There is good bone coverage in the stump Stump is warm and well perfused, without drainage; without  erythema   Assessment/plan:  Andrew [name] is a 57 y.o. male who is s/p Right Procedure(s): AMPUTATION BELOW KNEE   The patient's stump is clean, dry, intact or viable.  Follow-up 4 weeks from surgery  Pending Rehab verse SNF placement.  Clinton Gallant [facility] 8:32 AM 12/13/2012   Pt most likely to Rehab today BKA healing Follow up with Dr Early one month  Fabienne Bruns, MD [facility] [place] Office: [phone] Pager: [phone]

## 2012-12-13 NOTE — Progress Notes (Signed)
Admitting patient to CIR today. Dr Early reports pt is ready for discharge to CIR and pt is in agreement with plan.  I can be reached at [phone].

## 2012-12-13 NOTE — Discharge Summary (Signed)
[facility] Discharge Summary   Patient ID:  Andrew [name] MRN: [id number] DOB/AGE: 1955-09-21 57 y.o.  Admit date: 12/06/2012 Discharge date: 12/13/2012 Date of Surgery: 12/06/2012 - 12/10/2012 Surgeon: Moishe Spice): Larina Earthly, MD  Admission Diagnosis: GANGRENE TOE PVD  Discharge Diagnoses:  GANGRENE TOE PVD  Secondary Diagnoses: Past Medical History  Diagnosis Date  . Tobacco abuse   . 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 Manson Passey).  Pt will likely require a wide resection and  has been refered to ENT (2/12)  . Dental caries   . Glucose intolerance (impaired glucose tolerance)     Max random CBG 130 (08/29/07) typically about 100. A1C in 11/08 was 5.4.  . Peripheral vascular disease   . Hypertension, essential   . Dyslipidemia   . Anxiety   . Thoracic kyphosis   . Back pain      The patient has chronic lumbar/thoracic back pain, he has kyphosis, mild facet hypertrophy at L1-L2, circumferential annular bulging and mild vertebral body osteophytic formation at L2-L3, mild to moderate stenosis of the medial aspect of the neural foramen at L4-L5, and mild annular bulging at L5-S1.  Marland Kitchen History of alcohol abuse   . Hyperlipidemia   . Peripheral arterial disease   . Leg pain   . Back pain   . Ulcer   . Diabetes mellitus     borderline yrs ago  . GERD (gastroesophageal reflux disease)     Procedure(s): AMPUTATION BELOW KNEE   Discharged Condition: good  HPI: The patient has today from recent thrombectomy of a femoral to peroneal bypass on 11/10/2012. The patient had a remote history of femoroperoneal bypass in 2003 with Dr. Madilyn Fireman. He had a new right femoral to peroneal bypass by Dr. Edilia Bo November 2013. This was Gore-Tex saphenous vein composite. He presented with several week history of profound ischemia. He was taken to the operating room by myself and underwent thrombectomy of  his bypass. This is into a very small peroneal artery which had been seen in his preoperative arteriogram in November 2013. He had vein harvested from his right ankle for a vein patch angioplasty the distal anastomosis. He did have an necrotic changes of the tips of several toes of his left foot.  He had left BKA by Dr. Arbie Cookey 12/10/2012.  He has tolerated surgery well.  Pain is well controlled on PO oxycodone.   Hospital Course:  Andrew [name] is a 57 y.o. male is S/P Left Procedure(s): AMPUTATION BELOW KNEE  Extubated: POD # 0 Physical exam: Pt is A&Ox3  WDWN male with no complaints  Left amputation wound is healing well.  There is good bone coverage in the stump  Stump is warm and well perfused, without drainage; without erythema  Post-op wounds clean, dry, intact or healing well Pt. Ambulating, voiding and taking PO diet without difficulty. Pt pain controlled with PO pain meds. Labs as below Complications:none  Consults:     Significant Diagnostic Studies: CBC Lab Results  Component Value Date   WBC 11.2* 12/12/2012   HGB 10.0* 12/12/2012   HCT 28.9* 12/12/2012   MCV 83.3 12/12/2012   PLT 241 12/12/2012    BMET    Component Value Date/Time   NA 127* 12/12/2012 0546   K 3.6 12/12/2012 0546   CL 91* 12/12/2012 0546   CO2 25 12/12/2012 0546   GLUCOSE 127* 12/12/2012  0546   BUN 6 12/12/2012 0546   CREATININE 0.63 12/13/2012 0500   CALCIUM 9.3 12/12/2012 0546   GFRNONAA >90 12/13/2012 0500   GFRAA >90 12/13/2012 0500   COAG Lab Results  Component Value Date   INR 0.93 12/10/2012   INR 0.98 12/06/2012   INR 0.88 11/09/2012     Disposition:  Discharge to :Rehab  Future Appointments Provider Department Dept Phone   01/16/2013 3:00 PM [facility] [phone]   01/16/2013 3:30 PM [facility] [phone]   01/16/2013 4:00 PM Evern Bio, NP [facility] [phone]        Medication List    ASK your doctor about these medications       aspirin EC 81 MG tablet  Take 81 mg by mouth daily.     B COMPLEX FORMULA 1 PO  Take 1 tablet by mouth daily.     clonazePAM 1 MG tablet  Commonly known as:  KLONOPIN  Take 1 tablet (1 mg total) by mouth 3 (three) times daily as needed for anxiety.     enalapril 20 MG tablet  Commonly known as:  VASOTEC  Take 1 tablet (20 mg total) by mouth daily.     hydrochlorothiazide 25 MG tablet  Commonly known as:  HYDRODIURIL  Take 1 tablet (25 mg total) by mouth daily.     HYDROcodone-acetaminophen 10-325 MG per tablet  Commonly known as:  NORCO  Take 1 tablet by mouth every 4 (four) hours as needed.     metoprolol 50 MG tablet  Commonly known as:  LOPRESSOR  Take 50 mg by mouth 2 (two) times daily.     multivitamin with minerals tablet  Take 1 tablet by mouth daily.     omeprazole 20 MG capsule  Commonly known as:  PRILOSEC  Take 1 capsule (20 mg total) by mouth daily.     oxyCODONE-acetaminophen 5-325 MG per tablet  Commonly known as:  PERCOCET/ROXICET  Take 2 tablets by mouth every 4 (four) hours as needed for pain.     sertraline 100 MG tablet  Commonly known as:  ZOLOFT  Take 1 tablet (100 mg total) by mouth daily.     simvastatin 40 MG tablet  Commonly known as:  ZOCOR  Take 1 tablet (40 mg total) by mouth at bedtime.     traMADol 50 MG tablet  Commonly known as:  ULTRAM  Take 50 mg by mouth every 6 (six) hours as needed for pain.     varenicline 1 MG tablet  Commonly known as:  CHANTIX  Take 1 mg by mouth 2 (two) times daily.       Verbal and written Discharge instructions given to the patient. Wound care per Discharge AVS F/U with Dr. Edilia Bo in 4 weeks.  SignedMosetta Pigeon 12/13/2012, 8:35 AM   Discharge medications: Statin use:  Yes If No: [ ]  For Medical reasons, [ ]  Non-compliant ASA use:  Yes  If No: [ ]  For Medical reasons, [ ]  Non-compliant Beta blocker use:  No If  No: [ ]  For Medical reasons, [ ]  Non-compliant ACE-Inhibitor use:  Yes If No: [ ]  For Medical reasons, [ ]  Non-compliant P2Y12 Antagonist use: [x ] None, [ ]  Plavix, [ ]  Plasugrel, [ ]  Ticlopinine, [ ]  Ticagrelor, [ ]  Other, [ ]  No for medical reason, [ ]  Non-compliant Anti-coagulant use:  [ ]  None, [ ]   Warfarin, [ ]  Rivaroxaban, [ ]  Dabigatran, [ ]  Other, [ ]  No for medical reason, [ ]  Non-compliant

## 2012-12-13 NOTE — Clinical Social Work Note (Signed)
Clinical Social Worker was following patient for possible SNF placement.  Patient has been accepted to inpatient rehab last minute and will no longer require SNF at discharge.  Patient being admitted to inpatient rehab today - patient in agreement.  Clinical Social Worker will sign off for now as social work intervention is no longer needed. Please consult [place] again if new need arises.  [name], [place] [phone]

## 2012-12-13 NOTE — Plan of Care (Signed)
Overall Plan of Care [facility]) Patient Details Name: Andrew [name] MRN: [id number] DOB: 09-27-1955  Diagnosis:  Left   BKA  Co-morbidities: Tobacco abuse  . 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 Manson Passey). Pt will likely require a wide resection and has been refered to ENT (2/12)  . Dental caries  . Glucose intolerance (impaired glucose tolerance)  Max random CBG 130 (08/29/07) typically about 100. A1C in 11/08 was 5.4.  . Peripheral vascular disease  . Hypertension, essential  . Dyslipidemia  . Anxiety  . Thoracic kyphosis  . Back pain  The patient has chronic lumbar/thoracic back pain, he has kyphosis, mild facet hypertrophy at L1-L2, circumferential annular bulging and mild vertebral body osteophytic formation at L2-L3, mild to moderate stenosis of the medial aspect of the neural foramen at L4-L5, and mild annular bulging at L5-S1.  Marland Kitchen History of alcohol abuse  . Hyperlipidemia  . Peripheral arterial disease    Functional Problem List  Patient demonstrates impairments in the following areas: Balance, Bladder, Bowel, Edema, Endurance, Medication Management, Nutrition, Pain, Safety and Skin Integrity  Basic ADL's: bathing, dressing and toileting Advanced ADL's: simple meal preparation  Transfers:  bed to chair, toilet, tub/shower, car and furniture Locomotion:  ambulation, wheelchair mobility and stairs  Additional Impairments:  None  Anticipated Outcomes Item Anticipated Outcome  Eating/Swallowing  Independent  Basic self-care  Mod I  Tolieting  Mod I  Bowel/Bladder  Min assist  Transfers  Mod I to toilet, tub, bed  Locomotion  Mod I  Communication    Cognition    Pain  Independent  Safety/Judgment    Other     Therapy Plan:   OT Intensity: Minimum of 1-2 x/day, 45 to 90 minutes OT Frequency: 5 out of 7 days OT Duration/Estimated Length of Stay:  7 days      Team  Interventions: Item RN PT OT SLP SW TR Other  Self Care/Advanced ADL Retraining   x      Neuromuscular Re-Education  x       Therapeutic Activities  x x      UE/LE Strength Training/ROM  x x      UE/LE Coordination Activities  x       Visual/Perceptual Remediation/Compensation         DME/Adaptive Equipment Instruction  x x      Therapeutic Exercise  x x      Balance/Vestibular Training  x x      Patient/Family Education  x x      Cognitive Remediation/Compensation         Functional Mobility Training  x x      Ambulation/Gait Training  x       Museum/gallery curator  x       Wheelchair Propulsion/Positioning  x       Functional Tourist information centre manager Reintegration  x       Dysphagia/Aspiration Film/video editor         Bladder Management X        Bowel Management X        Disease Management/Prevention X        Pain Management X x       Medication Management X        Skin Care/Wound Management X  Splinting/Orthotics  x       Discharge Planning  x x      Psychosocial Support                                Team Discharge Planning: Destination: PT-  ,OT- Home , SLP-  Projected Follow-up: PT- , OT-  None, SLP-  Projected Equipment Needs: PT- , OT-  , SLP-  Patient/family involved in discharge planning: PT-  ,  OT-Patient, SLP-   MD ELOS: 10 days Medical Rehab Prognosis:  Excellent Assessment: 57 year old male with peripheral vascular disease and osteomyelitis admitted for left BKA. Now requiring 24 7 rehabilitation RN and M.D. As well as CIR level PT OT. He will focus on ADLs, mobility, pre-prosthetic training.goals are for mod I level.    See Team Conference Notes for weekly updates to the plan of care

## 2012-12-13 NOTE — Progress Notes (Signed)
Pt arrived on unit at 1625, assigned to room 4153.

## 2012-12-13 NOTE — PMR Pre-admission (Signed)
PMR Admission Coordinator Pre-Admission Assessment  Patient: Andrew [name] is an 57 y.o., male MRN: [id number] DOB: [birth year] Height: 5\' 11"  (180.3 cm) Weight: 66.2 kg (145 lb 15.1 oz)              Insurance Information Primary: Medicare Policy #: [id number]  Subscriber: Patient Benefits: [place] Effective Date: 02/05/2010 Deductible: $1126 OOP Max: None Life Max: Unlimited CIR: 100% SNF: 100 days  Outpatient: 80% Copay: 20%  Home Health: 100% DME: 80%  Copay: 20% Providers: Patient's choice  SECONDARY: Medicaid  Policy#: [id number]      Subscriber: self   Emergency Contact Information Contact Information   Name Relation Home Work Mobile   [place] Sister [phone]     Clark,Annette Mother [phone]  [phone]     Current Medical History  Patient Admitting Diagnosis: Left BKA  History of Present Illness: 57 y.o. right-handed male with history of peripheral vascular disease and multiple revascularization procedures. Most recently amputation of left second toe secondary to osteomyelitis as well as thrombectomy of left femoral to peroneal bypass revision with angioplasty in early April 2014. Presented 12/06/2012 with gangrenous foot. No relief with conservative care. Underwent left below-knee amputation 12/10/2012 per Dr. Edilia Bo. Postoperative pain management. Placed on subcutaneous Lovenox for DVT prophylaxis.  Update 12/13/12: Maintained on Keflex since 12/10/2012 for wound coverage and since discontinued. A limb guard was placed per [employer].    Past Medical History  Past Medical History  Diagnosis Date  . Tobacco abuse   . 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 Manson Passey).  Pt will likely require a wide resection and  has been refered to ENT (2/12)  . Dental caries   . Glucose intolerance (impaired glucose tolerance)     Max random CBG 130 (08/29/07) typically about 100.  A1C in 11/08 was 5.4.  . Peripheral vascular disease   . Hypertension, essential   . Dyslipidemia   . Anxiety   . Thoracic kyphosis   . Back pain      The patient has chronic lumbar/thoracic back pain, he has kyphosis, mild facet hypertrophy at L1-L2, circumferential annular bulging and mild vertebral body osteophytic formation at L2-L3, mild to moderate stenosis of the medial aspect of the neural foramen at L4-L5, and mild annular bulging at L5-S1.  Marland Kitchen History of alcohol abuse   . Hyperlipidemia   . Peripheral arterial disease   . Leg pain   . Back pain   . Ulcer   . Diabetes mellitus     borderline yrs ago  . GERD (gastroesophageal reflux disease)     Family History  family history includes Alcohol abuse in his father; Cancer in his mother; Heart disease in his father and mother; and Stroke in his father.  Prior Rehab/Hospitalizations: no prior CIR.   Current Medications  Current facility-administered medications:0.9 %  sodium chloride infusion, 250 mL, Intravenous, PRN, Chuck Hint, MD;  0.9 % NaCl with KCl 20 mEq/ L  infusion, , Intravenous, Continuous, Amelia Jo Waverly, PA-C, Last Rate: 75 mL/hr at 12/10/12 1523, 75 mL/hr at 12/10/12 1523;  acetaminophen (TYLENOL) suppository 325-650 mg, 325-650 mg, Rectal, Q4H PRN, Chuck Hint, MD acetaminophen (TYLENOL) suppository 325-650 mg, 325-650 mg, Rectal, Q4H PRN, Amelia Jo Roczniak, PA-C;  acetaminophen (TYLENOL) tablet 325-650 mg, 325-650 mg, Oral, Q4H PRN, Chuck Hint, MD;  acetaminophen (TYLENOL) tablet 325-650 mg, 325-650 mg, Oral, Q4H PRN,  Regina J Roczniak, PA-C;  alum & mag hydroxide-simeth (MAALOX/MYLANTA) 200-200-20 MG/5ML suspension 15-30 mL, 15-30 mL, Oral, Q2H PRN, Chuck Hint, MD aspirin EC tablet 81 mg, 81 mg, Oral, Daily, Chuck Hint, MD, 81 mg at 12/13/12 1014;  cephALEXin (KEFLEX) capsule 500 mg, 500 mg, Oral, Q8H, Chuck Hint, MD, 500 mg at 12/13/12 0606;   clonazePAM (KLONOPIN) tablet 1 mg, 1 mg, Oral, Daily PRN, Regina J Roczniak, PA-C;  clonazePAM (KLONOPIN) tablet 2 mg, 2 mg, Oral, QHS PRN, Amelia Jo Roczniak, PA-C, 2 mg at 12/13/12 0214 docusate sodium (COLACE) capsule 100 mg, 100 mg, Oral, Daily, Regina J Roczniak, PA-C, 100 mg at 12/13/12 1014;  enalapril (VASOTEC) tablet 20 mg, 20 mg, Oral, Daily, Chuck Hint, MD, 20 mg at 12/13/12 1015;  enoxaparin (LOVENOX) injection 40 mg, 40 mg, Subcutaneous, Q24H, Regina J Roczniak, PA-C, 40 mg at 12/13/12 1013;  guaiFENesin-dextromethorphan (ROBITUSSIN DM) 100-10 MG/5ML syrup 15 mL, 15 mL, Oral, Q4H PRN, Chuck Hint, MD hydrALAZINE (APRESOLINE) injection 10 mg, 10 mg, Intravenous, Q2H PRN, Chuck Hint, MD;  hydrochlorothiazide (HYDRODIURIL) tablet 25 mg, 25 mg, Oral, Daily, Chuck Hint, MD, 25 mg at 12/13/12 1015;  HYDROcodone-acetaminophen (NORCO) 10-325 MG per tablet 1 tablet, 1 tablet, Oral, Q4H PRN, Chuck Hint, MD, 1 tablet at 12/08/12 1810 labetalol (NORMODYNE,TRANDATE) injection 10 mg, 10 mg, Intravenous, Q2H PRN, Chuck Hint, MD;  labetalol (NORMODYNE,TRANDATE) injection 10 mg, 10 mg, Intravenous, Q2H PRN, Regina J Roczniak, PA-C;  lactated ringers infusion, , Intravenous, Continuous, Chuck Hint, MD, Last Rate: 50 mL/hr at 12/06/12 1145;  metoprolol (LOPRESSOR) injection 2-5 mg, 2-5 mg, Intravenous, Q2H PRN, Chuck Hint, MD morphine 2 MG/ML injection 2 mg, 2 mg, Intravenous, Q1H PRN, Amelia Jo Roczniak, PA-C;  multivitamin with minerals tablet 1 tablet, 1 tablet, Oral, Daily, Chuck Hint, MD, 1 tablet at 12/13/12 1014;  ondansetron (ZOFRAN) injection 4 mg, 4 mg, Intravenous, Q6H PRN, Nada Libman, MD;  oxyCODONE-acetaminophen (PERCOCET/ROXICET) 5-325 MG per tablet 2 tablet, 2 tablet, Oral, Q4H PRN, Chuck Hint, MD, 2 tablet at 12/13/12 1015 pantoprazole (PROTONIX) EC tablet 40 mg, 40 mg, Oral, Daily,  Chuck Hint, MD, 40 mg at 12/13/12 1014;  phenol (CHLORASEPTIC) mouth spray 1 spray, 1 spray, Mouth/Throat, PRN, Chuck Hint, MD, 1 spray at 12/08/12 1109;  sertraline (ZOLOFT) tablet 100 mg, 100 mg, Oral, Daily, Chuck Hint, MD, 100 mg at 12/13/12 1014;  simvastatin (ZOCOR) tablet 40 mg, 40 mg, Oral, QHS, Chuck Hint, MD, 40 mg at 12/12/12 2138 sodium chloride 0.9 % injection 3 mL, 3 mL, Intravenous, Q12H, Chuck Hint, MD, 3 mL at 12/12/12 2138;  sodium chloride 0.9 % injection 3 mL, 3 mL, Intravenous, PRN, Chuck Hint, MD;  traMADol Janean Sark) tablet 50 mg, 50 mg, Oral, Q6H PRN, Chuck Hint, MD;  varenicline (CHANTIX) tablet 1 mg, 1 mg, Oral, BID, Chuck Hint, MD, 1 mg at 12/13/12 1014  Patients Current Diet: General  Precautions / Restrictions Precautions Precautions: Fall Restrictions Weight Bearing Restrictions: No   Prior Activity Level Limited Community (1-2x/wk): 3-4 x per week Home Assistive Devices / Equipment Home Assistive Devices/Equipment: Eyeglasses Home Adaptive Equipment: Tub transfer bench;Bedside commode/3-in-1;Walker - standard;Walker - rolling  Prior Functional Level Prior Function Level of Independence: Independent with assistive device(s) Able to Take Stairs?: Yes Driving: Yes Vocation: On disability Comments: walking sometimes without std walker  Current Functional Level Cognition  Arousal/Alertness: Awake/alert Overall Cognitive Status:  Within Functional Limits for tasks assessed (? if pt completely aware of (A) he will need at d/c.  ) Overall Cognitive Status: Appears within functional limits for tasks assessed/performed Orientation Level: Oriented X4    Extremity Assessment (includes Sensation/Coordination)  RUE ROM/Strength/Tone: Within functional levels  RLE ROM/Strength/Tone: WFL for tasks assessed (5/5 dorsiflexion; 5/5 knee extension) RLE Sensation: Deficits;History of  peripheral neuropathy RLE Coordination: WFL - gross motor    ADLs  Eating/Feeding: Independent Where Assessed - Eating/Feeding: Chair Grooming: Performed;Wash/dry hands;Wash/dry face;Modified independent Where Assessed - Grooming: Unsupported sitting Upper Body Bathing: Simulated;Set up Where Assessed - Upper Body Bathing: Unsupported sitting Lower Body Bathing: Minimal assistance Where Assessed - Lower Body Bathing: Lean right and/or left Upper Body Dressing: Simulated;Set up Where Assessed - Upper Body Dressing: Unsupported sitting Lower Body Dressing: Simulated;Moderate assistance;Minimal assistance Where Assessed - Lower Body Dressing: Lean right and/or left Toilet Transfer: Simulated;Minimal assistance Toilet Transfer Method: Stand pivot Toilet Transfer Equipment: Bedside commode Toileting - Clothing Manipulation and Hygiene: Simulated;Minimal assistance Where Assessed - Engineer, mining and Hygiene: Lean right and/or left Transfers/Ambulation Related to ADLs: Patient performs transfers with RW and min A. ADL Comments: Patient with good flexibility to R foot and can don/doff sock. Good mobility leaning left/right. Reports phantom sensations L residual limb.    Mobility  Bed Mobility: Supine to Sit;Sitting - Scoot to Edge of Bed Supine to Sit: 6: Modified independent (Device/Increase time);HOB flat Sitting - Scoot to Edge of Bed: 6: Modified independent (Device/Increase time)    Transfers  Transfers: Sit to Stand;Stand to [employer] Transfers Sit to Stand: 4: Min assist;With upper extremity assist;With armrests;From bed;From chair/3-in-1 Stand to Sit: 4: Min assist;With upper extremity assist;With armrests;To chair/3-in-1 Stand Pivot Transfers: 4: Min assist    Ambulation / Gait / Stairs / Psychologist, prison and probation services  Ambulation/Gait Ambulation/Gait Assistance: 4: Min Environmental consultant (Feet): 16 Feet Assistive device: Rolling walker Ambulation/Gait  Assistance Details: Cues for safe use of RW, not to jerk RW around when navigating around obstables/between tight spaces, cues to ensure balance before taking next hop.  (A) for balance & safety.   Gait Pattern: Step-to pattern Stairs: No Wheelchair Mobility Wheelchair Mobility: No    Posture / Balance Static Sitting Balance Static Sitting - Balance Support: No upper extremity supported;Feet unsupported Static Sitting - Level of Assistance: 5: Stand by assistance Static Sitting - Comment/# of Minutes: loses balance posteriorly with max perturbation, able to recover without assist Static Standing Balance Static Standing - Balance Support: Bilateral upper extremity supported Static Standing - Level of Assistance: 4: Min assist Static Standing - Comment/# of Minutes: up to 20 seconds; limited by pain/phantom pain    Special needs/care consideration Continuous Drip IV: yes Skin: Left residual limb incision intact with ace wrap. Limb guard in place. Bowel mgmt: LBM 12/12/12 Bladder mgmt: WDL Education on smoking cessation    Previous Home Environment Living Arrangements: Other relatives Lives With: Alone Available Help at Discharge: Family (lives in addition onto brother's home, calls brother at time) Type of Home: Other (Comment) (addition on home) Home Layout: One level Home Access: Stairs to enter (concrete blocks, wide enough for walker) Entrance Stairs-Rails: None Entrance Stairs-Number of Steps: 3 Bathroom Shower/Tub: Forensic scientist: Standard Bathroom Accessibility: Yes How Accessible: Accessible via walker;Other (comment) (only if turn walker sideways) Home Care Services: No  Discharge Living Setting Plans for Discharge Living Setting: Patient's home;Alone Type of Home at Discharge:  Lives in an adddition on brother's home. Discharge Home  Layout: one level Discharge Home Access: Stairs to enter Entrance Stairs-Rails: None Entrance Stairs-Number of  Steps: 3 Discharge Bathroom Shower/Tub: Tub/shower unit Discharge Bathroom Toilet: Standard Do you have any problems obtaining your medications?: No  Social/Family/Support Systems Patient Roles: Brother, Son Solicitor Information: Home:[phone] Anticipated Caregiver: Goals are modified independent. Family can provide intermittent assist. Anticipated Caregiver's Contact Information: Isidoro Donning (sister) [phone] Caregiver Availability: Intermittent Discharge Plan Discussed with Primary Caregiver: No Is Caregiver In Agreement with Plan?:  (Unable to reach family members by phone.) Does Caregiver/Family have Issues with Lodging/Transportation while Pt is in Rehab?: No  Goals/Additional Needs Patient/Family Goal for Rehab: modified independent Expected length of stay: 7-10 days Cultural Considerations: none Dietary Needs: Regular Equipment Needs: TBD Pt/Family Agrees to Admission and willing to participate: Yes (Patient) Program Orientation Provided & Reviewed with Pt/Caregiver Including Roles  & Responsibilities: Yes (patient)   Decrease burden of Care through IP rehab admission: n/a   Possible need for SNF placement upon discharge: Not anticipated. Patient plans to return home.   Patient Condition: This patient's medical and functional status has changed since the consult dated: 12/13/12 in which the Rehabilitation Physician determined and documented that the patient's condition is appropriate for intensive rehabilitative care in an inpatient rehabilitation facility. See "History of Present Illness" (above) for medical update. Functional changes are: ambulated 38' with minimum assistance with PT. Patient's medical and functional status update has been discussed with the Rehabilitation physician and patient remains appropriate for inpatient rehabilitation. Will admit to inpatient rehab today.  Preadmission Screen Completed By:  Meryl Dare, 12/13/2012 11:28  AM ______________________________________________________________________   Discussed status with Dr. Wynn Banker on 12/13/12 at 12:05 and received telephone approval for admission today.  Admission Coordinator:  Meryl Dare, time 1205/Date5/8/14

## 2012-12-13 NOTE — Progress Notes (Signed)
Pt transferred to IP rehab.  Report called to Ed, Charity fundraiser.  IV D/c.  Tele D/c.  Belongings transported with pt.

## 2012-12-14 ENCOUNTER — Inpatient Hospital Stay (HOSPITAL_COMMUNITY): Payer: Medicare Other

## 2012-12-14 ENCOUNTER — Inpatient Hospital Stay (HOSPITAL_COMMUNITY): Payer: Medicare Other | Admitting: Occupational Therapy

## 2012-12-14 DIAGNOSIS — L98499 Non-pressure chronic ulcer of skin of other sites with unspecified severity: Secondary | ICD-10-CM

## 2012-12-14 DIAGNOSIS — I739 Peripheral vascular disease, unspecified: Secondary | ICD-10-CM

## 2012-12-14 DIAGNOSIS — Z89519 Acquired absence of unspecified leg below knee: Secondary | ICD-10-CM

## 2012-12-14 DIAGNOSIS — S88119A Complete traumatic amputation at level between knee and ankle, unspecified lower leg, initial encounter: Secondary | ICD-10-CM

## 2012-12-14 LAB — CBC WITH DIFFERENTIAL/PLATELET
Basophils Absolute: 0 10*3/uL (ref 0.0–0.1)
Eosinophils Relative: 5 % (ref 0–5)
HCT: 32.6 % — ABNORMAL LOW (ref 39.0–52.0)
Hemoglobin: 11.1 g/dL — ABNORMAL LOW (ref 13.0–17.0)
Lymphocytes Relative: 16 % (ref 12–46)
Lymphs Abs: 1.4 10*3/uL (ref 0.7–4.0)
MCV: 85.8 fL (ref 78.0–100.0)
Monocytes Absolute: 1.3 10*3/uL — ABNORMAL HIGH (ref 0.1–1.0)
Monocytes Relative: 15 % — ABNORMAL HIGH (ref 3–12)
Neutro Abs: 5.4 10*3/uL (ref 1.7–7.7)
RDW: 13.8 % (ref 11.5–15.5)
WBC: 8.4 10*3/uL (ref 4.0–10.5)

## 2012-12-14 LAB — COMPREHENSIVE METABOLIC PANEL
BUN: 10 mg/dL (ref 6–23)
CO2: 28 mEq/L (ref 19–32)
Calcium: 9.9 mg/dL (ref 8.4–10.5)
Chloride: 94 mEq/L — ABNORMAL LOW (ref 96–112)
Creatinine, Ser: 0.73 mg/dL (ref 0.50–1.35)
Glucose, Bld: 106 mg/dL — ABNORMAL HIGH (ref 70–99)
Total Bilirubin: 0.3 mg/dL (ref 0.3–1.2)

## 2012-12-14 MED ORDER — ENSURE COMPLETE PO LIQD: 120.0000 mL | Freq: Four times a day (QID) | ORAL | Status: DC

## 2012-12-14 MED ORDER — BOOST / RESOURCE BREEZE PO LIQD
1.0000 | Freq: Four times a day (QID) | ORAL | Status: DC
  Administered 2012-12-14 – 2012-12-16 (×5): 1 via ORAL

## 2012-12-14 NOTE — Evaluation (Signed)
Occupational Therapy Assessment and Plan  Patient Details  Name: Andrew [name] MRN: [id number] Date of Birth: 07/24/1956  OT Diagnosis: muscle weakness (generalized) Rehab Potential: Rehab Potential: Good ELOS:  7 days   Today's Date: 12/14/2012 Time: 4098-1191 and 4782-9562 Time Calculation (min): 60 min and 60 min  Problem List:  Patient Active Problem List   Diagnosis Date Noted  . S/P BKA (below knee amputation) 12/14/2012  . Abnormality of gait 11/10/2012  . Acute gingivitis 09/03/2012  . Atherosclerosis of native arteries of the extremities with intermittent claudication 06/08/2011  . Preventative health care 09/15/2010  . [facility] COMPLICATION DUE CORONARY BYPASS GRAFT 06/10/2010  . CHEST PAIN 03/04/2010  . 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 OTHER MALIGNANT NEOPLASM OF SKIN OF LIP 01/23/2009  . OTHER DENTAL CARIES 03/14/2008  . DYSLIPIDEMIA 05/10/2006  . ANXIETY 05/10/2006  . TOBACCO ABUSE 05/10/2006  . HYPERTENSION, ESSENTIAL NOS 05/10/2006  . PERIPHERAL VASCULAR DISEASE 05/10/2006  . BACK PAIN 05/10/2006  . 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 GLUCOSE INTOLERANCE, HX OF 05/10/2006    Past Medical History:  Past Medical History  Diagnosis Date  . Tobacco abuse   . 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 Manson Passey).  Pt will likely require a wide resection and  has been refered to ENT (2/12)  . Dental caries   . Glucose intolerance (impaired glucose tolerance)     Max random CBG 130 (08/29/07) typically about 100. A1C in 11/08 was 5.4.  . Peripheral vascular disease   . Hypertension, essential   . Dyslipidemia   . Anxiety   . Thoracic kyphosis   . Back pain      The patient has chronic lumbar/thoracic back pain, he has kyphosis, mild facet hypertrophy at L1-L2, circumferential annular bulging and mild vertebral body osteophytic formation at L2-L3, mild to moderate stenosis of the  medial aspect of the neural foramen at L4-L5, and mild annular bulging at L5-S1.  Marland Kitchen History of alcohol abuse   . Hyperlipidemia   . Peripheral arterial disease   . Leg pain   . Back pain   . Ulcer   . Diabetes mellitus     borderline yrs ago  . GERD (gastroesophageal reflux disease)    Past Surgical History:  Past Surgical History  Procedure Laterality Date  . Carotid endarterectomy    . Penile prosthesis implant  1985    secondary to a forklift acciednt and crushed pelvis  . Pr vein bypass graft,aorto-fem-pop  2003    Dr. Madilyn Fireman  . Pr vein bypass graft,aorto-fem-pop  05/12/11    Left fem-peroneal BPG   . Bypass graft femoral-peroneal  06/12/2012    Procedure: BYPASS GRAFT FEMORAL-PERONEAL;  Surgeon: Chuck Hint, MD;  Location: [facility] OR;  Service: Vascular;  Laterality: Left;  Redo fermoral - peroneal artery bypass using     composite propaten 6mm x 80cm and left saphenous vein.  . Bypass graft femoral-peroneal Left 11/10/2012    Procedure: Thrombectomy and Revision Left FEMORAL-PERONEAL Bypass Graft;  Surgeon: Larina Earthly, MD;  Location: [facility] OR;  Service: Vascular;  Laterality: Left;  . Amputation Left 12/06/2012    Procedure: AMPUTATION DIGIT;  Surgeon: Chuck Hint, MD;  Location: [facility] OR;  Service: Vascular;  Laterality: Left;  2ND TOE  . Amputation Left 12/10/2012    Procedure: AMPUTATION BELOW KNEE ;  Surgeon: Larina Earthly,  MD;  Location: [facility] OR;  Service: Vascular;  Laterality: Left;    Assessment & Plan Clinical Impression:   57 y.o. right-handed male with history of peripheral vascular disease and multiple revascularization procedures. Most recently amputation of left second toe secondary to osteomyelitis as well as thrombectomy of left femoral to peroneal bypass revision with angioplasty in early April 2014. Presented 12/06/2012 with gangrenous foot. No relief with conservative care. Underwent left below-knee amputation 12/10/2012 per Dr. Edilia Bo. A limb guard was placed  per [employer]. Postoperative pain management. Acute blood loss anemia after surgery latest hemoglobin of 10 and monitored. Placed on subcutaneous Lovenox for DVT prophylaxis.  Andrew [name] is a 57 y.o. right-handed male with history of peripheral vascular disease and multiple revascularization procedures. Most recently amputation of left second toe secondary to osteomyelitis as well as thrombectomy of left femoral to peroneal bypass revision with angioplasty in early April 2014. Presented 12/06/2012 with gangrenous foot. No relief with conservative care. Underwent left below-knee amputation 12/10/2012 per Dr. Edilia Bo. A limb guard was placed per [employer]. Postoperative pain management. Acute blood loss anemia after surgery latest hemoglobin of 10 and monitored. Placed on subcutaneous Lovenox for DVT prophylaxis. Maintained on Keflex since 12/10/2012 for wound coverage and since discontinued. Physical and occupational therapy evaluations completed an ongoing with recommendations of physical medicine rehabilitation consult to consider inpatient rehabilitation services.  Patient was felt to be a good candidate for inpatient rehabilitation services and was admitted for comprehensive rehabilitation program. Patient transferred to CIR on 12/13/2012 .    Patient currently requires min with basic self-care skills secondary to muscle weakness and decreased standing balance.  Prior to hospitalization, patient was independent with basic self care and simple meal prep.  Patient will benefit from skilled intervention to increase independence with basic self-care skills and increase level of independence with iADL prior to discharge home independently.  Anticipate patient will require intermittent supervision and no further OT follow recommended.  OT - End of Session Activity Tolerance: Tolerates 30+ min activity with multiple rests Endurance Deficit: Yes Endurance Deficit Description:  deconditioned OT Assessment Rehab Potential: Good Barriers to Discharge: None OT Plan OT Intensity: Minimum of 1-2 x/day, 45 to 90 minutes OT Frequency: 5 out of 7 days OT Duration/Estimated Length of Stay:  7 days OT Treatment/Interventions: Discharge planning;DME/adaptive equipment instruction;Functional mobility training;Pain management;Patient/family education;Self Care/advanced ADL retraining;Therapeutic Activities;Therapeutic Exercise;UE/LE Strength taining/ROM OT Recommendation Patient destination: Home Follow Up Recommendations: None   Skilled Therapeutic Intervention Visit 1:  Pt seen for the initial evaluation and self care ADL retraining with a focus on transfers, activity tolerance, and balance.  Pt very motivated and participates well despite intense pain at 9/10.  He was premedicated prior to therapy.  Overall needs min assist with balance with LB self care and transfers.  Pt provided with a wheelchair with a left amputee pad to support limb.  Visit 2:  Pt stated that his pain had improved to a 7/10.  He worked on ADL transfers and functional mobility this session.  Pt worked on wheeling his w/c into bathroom without assist and transferred on and off BSC over toilet with min assist and then wheeled back out.  He then worked on transfers from w/c to bed with RW with min assist and then ambulated into bathroom and back with min assist with no significant fatigue.  Pt taken to ADL apt to work on TB transfers but bathroom was occupied.  Spent time discussing layout of his small  apartment and what changes will need to be made.  He will need a lower bed, move kitchen table and microwave near refrigerator, move dressers.  Will need to do family education with his brother and sister on rearranging his living environment. Pt is independent with w/c mobility on the unit and in his room so pt took himself back to his room.  OT Evaluation Precautions/Restrictions  Precautions Precautions:  Fall Required Braces or Orthoses:  (limb guard) Restrictions Weight Bearing Restrictions: No  Pain Pain Assessment Pain Assessment: 0-10 Pain Score:   9 Pain Type: Surgical pain Pain Location: Leg Pain Orientation: Left Pain Descriptors: Aching Pain Intervention(s): Other (Comment) (premedicated) Home Living/Prior Functioning Home Living Lives With: Alone Available Help at Discharge: Family Type of Home: Apartment (addition on home) Home Access: Stairs to enter Secretary/administrator of Steps: 3 Entrance Stairs-Rails: None (plan to build ramp) Home Layout: One level Bathroom Shower/Tub: Forensic scientist: Standard Bathroom Accessibility: Yes How Accessible: Accessible via walker;Other (comment) (needs to enter bathroom sideways) Home Adaptive Equipment: Tub transfer bench;Bedside commode/3-in-1;Walker - standard;Walker - rolling Prior Function Level of Independence: Independent with basic ADLs;Independent with transfers;Independent with homemaking with ambulation Able to Take Stairs?: Yes Driving: Yes Vocation: On disability ADL  min assist with LB adls, refer to FIM Vision/Perception  Vision - History Baseline Vision: Wears glasses all the time Patient Visual Report: No change from baseline Vision - Assessment Eye Alignment: Within Functional Limits Perception Perception: Within Functional Limits Praxis Praxis: Intact  Cognition Overall Cognitive Status: Within Functional Limits for tasks assessed Orientation Level: Oriented X4 Sensation Sensation Light Touch: Impaired by gross assessment (right foot) Stereognosis: Appears Intact Hot/Cold: Appears Intact Proprioception: Appears Intact Coordination Gross Motor Movements are Fluid and Coordinated: Yes Fine Motor Movements are Fluid and Coordinated: Yes Motor  Motor Motor - Skilled Clinical Observations: generalized muscle weakness Mobility    min assist with transfers Trunk/Postural  Assessment  Cervical Assessment Cervical Assessment: Within Functional Limits Thoracic Assessment Thoracic Assessment:  (kyphotic trunk, 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) Lumbar Assessment Lumbar Assessment: Within Functional Limits Postural Control Postural Control: Within Functional Limits  Balance Static Sitting Balance Static Sitting - Level of Assistance: 7: Independent Static Standing Balance Static Standing - Level of Assistance: 4: Min assist Dynamic Standing Balance Dynamic Standing - Level of Assistance: 4: Min assist Extremity/Trunk Assessment RUE Assessment RUE Assessment: Within Functional Limits LUE Assessment LUE Assessment: Within Functional Limits  FIM:  FIM - Grooming Grooming Steps: Wash, rinse, dry face;Wash, rinse, dry hands;Oral care, brush teeth, clean dentures;Brush, comb hair Grooming: 6: More than reasonable amount of time (w/c level) FIM - Bathing Bathing Steps Patient Completed: Chest;Right Arm;Left Arm;Abdomen;Front perineal area;Buttocks;Right upper leg;Right lower leg (including foot);Left upper leg Bathing: 4: Steadying assist FIM - Upper Body Dressing/Undressing Upper body dressing/undressing steps patient completed: Thread/unthread right sleeve of pullover shirt/dresss;Thread/unthread left sleeve of pullover shirt/dress;Put head through opening of pull over shirt/dress;Pull shirt over trunk Upper body dressing/undressing: 6: More than reasonable amount of time (w/c level) FIM - Lower Body Dressing/Undressing Lower body dressing/undressing steps patient completed: Thread/unthread right underwear leg;Thread/unthread left underwear leg;Pull underwear up/down;Thread/unthread right pants leg;Thread/unthread left pants leg;Pull pants up/down;Fasten/unfasten pants;Don/Doff right sock Lower body dressing/undressing: 4: Steadying Assist FIM - Bed/Chair Transfer Bed/Chair Transfer: 6: Supine > Sit: No assist;4: Bed > Chair or W/C: Min A (steadying Pt. >  75%) FIM - Tub/Shower Transfers Tub/shower Transfers: 0-Activity did not occur or was simulated   Refer to Care Plan for Long Term  Goals  Recommendations for other services: None  Discharge Criteria: Patient will be discharged from OT if patient refuses treatment 3 consecutive times without medical reason, if treatment goals not met, if there is a change in medical status, if patient makes no progress towards goals or if patient is discharged from hospital.  The above assessment, treatment plan, treatment alternatives and goals were discussed and mutually agreed upon: by patient  [facility] 12/14/2012, 9:33 AM

## 2012-12-14 NOTE — Progress Notes (Signed)
Patient ID: Andrew [name], male   DOB: 1956/03/22, 57 y.o.   MRN: [id number] Subjective/Complaints: 57 y.o. right-handed male with history of peripheral vascular disease and multiple revascularization procedures. Most recently amputation of left second toe secondary to osteomyelitis as well as thrombectomy of left femoral to peroneal bypass revision with angioplasty in early April 2014. Presented 12/06/2012 with gangrenous foot. No relief with conservative care. Underwent left below-knee amputation 12/10/2012 per Dr. Edilia Bo. A limb guard was placed per [employer]. Postoperative pain management. Acute blood loss anemia after surgery latest hemoglobin of 10 and monitored. Placed on subcutaneous Lovenox for DVT prophylaxis.    Objective: Vital Signs: Blood pressure 138/90, pulse 89, temperature 98.5 F (36.9 C), temperature source Oral, resp. rate 18, SpO2 98.00%. No results found. Results for orders placed during the hospital encounter of 12/13/12 (from the past 72 hour(s))  CBC     Status: Abnormal   Collection Time    12/13/12  7:38 PM      Result Value Range   WBC 9.5  4.0 - 10.5 K/uL   RBC 3.50 (*) 4.22 - 5.81 MIL/uL   Hemoglobin 9.9 (*) 13.0 - 17.0 g/dL   HCT 40.9 (*) 81.1 - 91.4 %   MCV 85.7  78.0 - 100.0 fL   MCH 28.3  26.0 - 34.0 pg   MCHC 33.0  30.0 - 36.0 g/dL   RDW 78.2  95.6 - 21.3 %   Platelets 308  150 - 400 K/uL  CREATININE, SERUM     Status: None   Collection Time    12/13/12  7:38 PM      Result Value Range   Creatinine, Ser 0.67  0.50 - 1.35 mg/dL   GFR calc non Af Amer >90  >90 mL/min   GFR calc Af Amer >90  >90 mL/min   Comment:            The eGFR has been calculated     using the CKD EPI equation.     This calculation has not been     validated in all clinical     situations.     eGFR's persistently     <90 mL/min signify     possible Chronic Kidney Disease.  CBC WITH DIFFERENTIAL     Status: Abnormal   Collection Time    12/14/12  6:30 AM   Result Value Range   WBC 8.4  4.0 - 10.5 K/uL   RBC 3.80 (*) 4.22 - 5.81 MIL/uL   Hemoglobin 11.1 (*) 13.0 - 17.0 g/dL   HCT 08.6 (*) 57.8 - 46.9 %   MCV 85.8  78.0 - 100.0 fL   MCH 29.2  26.0 - 34.0 pg   MCHC 34.0  30.0 - 36.0 g/dL   RDW 62.9  52.8 - 41.3 %   Platelets 333  150 - 400 K/uL   Neutrophils Relative 63  43 - 77 %   Neutro Abs 5.4  1.7 - 7.7 K/uL   Lymphocytes Relative 16  12 - 46 %   Lymphs Abs 1.4  0.7 - 4.0 K/uL   Monocytes Relative 15 (*) 3 - 12 %   Monocytes Absolute 1.3 (*) 0.1 - 1.0 K/uL   Eosinophils Relative 5  0 - 5 %   Eosinophils Absolute 0.4  0.0 - 0.7 K/uL   Basophils Relative 0  0 - 1 %   Basophils Absolute 0.0  0.0 - 0.1 K/uL  COMPREHENSIVE METABOLIC PANEL  Status: Abnormal   Collection Time    12/14/12  6:30 AM      Result Value Range   Sodium 133 (*) 135 - 145 mEq/L   Potassium 4.2  3.5 - 5.1 mEq/L   Chloride 94 (*) 96 - 112 mEq/L   CO2 28  19 - 32 mEq/L   Glucose, Bld 106 (*) 70 - 99 mg/dL   BUN 10  6 - 23 mg/dL   Creatinine, Ser 1.61  0.50 - 1.35 mg/dL   Calcium 9.9  8.4 - 09.6 mg/dL   Total Protein 7.6  6.0 - 8.3 g/dL   Albumin 3.6  3.5 - 5.2 g/dL   AST 44 (*) 0 - 37 U/L   ALT 20  0 - 53 U/L   Alkaline Phosphatase 70  39 - 117 U/L   Total Bilirubin 0.3  0.3 - 1.2 mg/dL   GFR calc non Af Amer >90  >90 mL/min   GFR calc Af Amer >90  >90 mL/min   Comment:            The eGFR has been calculated     using the CKD EPI equation.     This calculation has not been     validated in all clinical     situations.     eGFR's persistently     <90 mL/min signify     possible Chronic Kidney Disease.      Blood pressure 138/80, pulse 82, temperature 98.4 F (36.9 C), temperature source Oral, resp. rate 18, height 5\' 11"  (1.803 m), weight 66.2 kg (145 lb 15.1 oz), SpO2 100.00%.  Physical Exam  Vitals reviewed.  Constitutional: He is oriented to person, place, and time.  57 year old male appearing older than stated age  HENT:  Head:  Normocephalic.  Eyes: EOM are normal.  Neck: Neck supple. Thyromegaly present.  Cardiovascular: Normal rate and regular rhythm.  Pulmonary/Chest: Effort normal and breath sounds normal. No respiratory distress.  Abdominal: Soft. Bowel sounds are normal. He exhibits no distension.  Neurological: He is alert and oriented to person, place, and time.  Patient was able name person place as well as date of birth but somewhat slow to process. He was appropriate during exam with flat affect. UE's grossly 4/5. Left HF antigravity. RLE is grossly 3to 4/5. Senses pain and light touch in all 4.  Skin:  Left below knee amputation site small amt serosanguinous drainage, no erythema Psychiatric: He has a normal mood and affect. His behavior is normal   Assessment/Plan: 1. Functional deficits secondary to Left BKA which require 3+ hours per day of interdisciplinary therapy in a comprehensive inpatient rehab setting. Physiatrist is providing close team supervision and 24 hour management of active medical problems listed below. Physiatrist and rehab team continue to assess barriers to discharge/monitor patient progress toward functional and medical goals. FIM:                   Comprehension Comprehension Mode: Auditory Comprehension: 5-Understands complex 90% of the time/Cues < 10% of the time  Expression Expression Mode: Verbal Expression: 6-Expresses complex ideas: With extra time/assistive device  Social Interaction Social Interaction: 6-Interacts appropriately with others with medication or extra time (anti-anxiety, antidepressant).  Problem Solving Problem Solving: 5-Solves complex 90% of the time/cues < 10% of the time  Memory Memory: 6-More than reasonable amt of time Medical Problem List and Plan:  1. 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 DVT Prophylaxis/Anticoagulation: Subcutaneous Lovenox.  Monitor platelet counts any signs of bleeding  3. Pain  Management: Percocet as needed. Monitor with increased mobility  4. Mood: Zoloft 100 mg daily, Klonopin 1 mg daily as needed. Provide emotional support and positive reinforcement  5. Neuropsych: This patient is capable of making decisions on his/her own behalf.  6. Postoperative anemia. Latest hemoglobin of 10. Followup CBC  7. Hypertension. Vasotec 20 mg daily, hydrochlorothiazide 25 mg daily. Monitor with increased activity  8. Hyperlipidemia. Zocor  9. Tobacco abuse. Chantix 1 mg twice a day. Provide counseling  10. GERD. Protonix   LOS (Days) 1 A FACE TO FACE EVALUATION WAS PERFORMED  Kanoe Wanner E 12/14/2012, 8:34 AM

## 2012-12-14 NOTE — H&P (Signed)
CC: L knee amputation : HPI: Andrew [name] is a 57 y.o. right-handed male with history of peripheral vascular disease and multiple revascularization procedures. Most recently amputation of left second toe secondary to osteomyelitis as well as thrombectomy of left femoral to peroneal bypass revision with angioplasty in early April 2014. Presented 12/06/2012 with gangrenous foot. No relief with conservative care. Underwent left below-knee amputation 12/10/2012 per Dr. Edilia Bo. A limb guard was placed per [employer]. Postoperative pain management. Acute blood loss anemia after surgery latest hemoglobin of 10 and monitored. Placed on subcutaneous Lovenox for DVT prophylaxis. Maintained on Keflex since 12/10/2012 for wound coverage and since discontinued. Physical and occupational therapy evaluations completed an ongoing with recommendations of physical medicine rehabilitation consult to consider inpatient rehabilitation services.  Patient was felt to be a good candidate for inpatient rehabilitation services and was admitted for comprehensive rehabilitation program   Review of Systems   HENT: Positive for neck pain.   Cardiovascular: Positive for leg swelling.   Gastrointestinal:   Reflux   Musculoskeletal: Positive for myalgias and back pain.   Neurological: Positive for weakness.   Psychiatric/Behavioral: Positive for depression.   Anxiety   All other systems reviewed and are negative      Past Medical History   Diagnosis  Date   .  Tobacco abuse     .  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 Manson Passey).  Pt will likely require a wide resection and  has been refered to ENT (2/12)   .  Dental caries     .  Glucose intolerance (impaired glucose tolerance)         Max random CBG 130 (08/29/07) typically about 100. A1C in 11/08 was 5.4.   .  Peripheral vascular disease     .  Hypertension, essential      .  Dyslipidemia     .  Anxiety     .  Thoracic kyphosis     .  Back pain          The patient has chronic lumbar/thoracic back pain, he has kyphosis, mild facet hypertrophy at L1-L2, circumferential annular bulging and mild vertebral body osteophytic formation at L2-L3, mild to moderate stenosis of the medial aspect of the neural foramen at L4-L5, and mild annular bulging at L5-S1.   Marland Kitchen  History of alcohol abuse     .  Hyperlipidemia     .  Peripheral arterial disease     .  Leg pain     .  Back pain     .  Ulcer     .  Diabetes mellitus         borderline yrs ago   .  GERD (gastroesophageal reflux disease)      Past Surgical History   Procedure  Laterality  Date   .  Carotid endarterectomy       .  Penile prosthesis implant    1985       secondary to a forklift acciednt and crushed pelvis   .  Pr vein bypass graft,aorto-fem-pop    2003       Dr. Madilyn Fireman   .  Pr vein bypass graft,aorto-fem-pop    05/12/11       Left fem-peroneal BPG    .  Bypass graft femoral-peroneal    06/12/2012  Procedure: BYPASS GRAFT FEMORAL-PERONEAL;  Surgeon: Chuck Hint, MD;  Location: [facility] OR;  Service: Vascular;  Laterality: Left;  Redo fermoral - peroneal artery bypass using     composite propaten 6mm x 80cm and left saphenous vein.   .  Bypass graft femoral-peroneal  Left  11/10/2012       Procedure: Thrombectomy and Revision Left FEMORAL-PERONEAL Bypass Graft;  Surgeon: Larina Earthly, MD;  Location: [facility] OR;  Service: Vascular;  Laterality: Left;   .  Amputation  Left  12/06/2012       Procedure: AMPUTATION DIGIT;  Surgeon: Chuck Hint, MD;  Location: [facility] OR;  Service: Vascular;  Laterality: Left;  2ND TOE   .  Amputation  Left  12/10/2012       Procedure: AMPUTATION BELOW KNEE ;  Surgeon: Larina Earthly, MD;  Location: [facility] OR;  Service: Vascular;  Laterality: Left;    Family History   Problem  Relation  Age of Onset   .  Heart disease  Mother     .  Cancer  Mother     .  Alcohol abuse   Father     .  Heart disease  Father     .  Stroke  Father      Social History: reports that he has been smoking Cigarettes.  He has a 20 pack-year smoking history. He has never used smokeless tobacco. He reports that  drinks alcohol. He reports that he does not use illicit drugs. Allergies: No Known Allergies Medications Prior to Admission   Medication  Sig  Dispense  Refill   .  aspirin EC 81 MG tablet  Take 81 mg by mouth daily.         .  clonazePAM (KLONOPIN) 1 MG tablet  Take 1 tablet (1 mg total) by mouth 3 (three) times daily as needed for anxiety.   90 tablet   3   .  enalapril (VASOTEC) 20 MG tablet  Take 1 tablet (20 mg total) by mouth daily.   30 tablet   3   .  hydrochlorothiazide (HYDRODIURIL) 25 MG tablet  Take 1 tablet (25 mg total) by mouth daily.   30 tablet   11   .  HYDROcodone-acetaminophen (NORCO) 10-325 MG per tablet  Take 1 tablet by mouth every 4 (four) hours as needed.   150 tablet   5   .  Multiple Vitamins-Minerals (MULTIVITAMIN WITH MINERALS) tablet  Take 1 tablet by mouth daily.         Marland Kitchen  omeprazole (PRILOSEC) 20 MG capsule  Take 1 capsule (20 mg total) by mouth daily.   30 capsule   11   .  sertraline (ZOLOFT) 100 MG tablet  Take 1 tablet (100 mg total) by mouth daily.   30 tablet   11   .  simvastatin (ZOCOR) 40 MG tablet  Take 1 tablet (40 mg total) by mouth at bedtime.   30 tablet   11   .  traMADol (ULTRAM) 50 MG tablet  Take 50 mg by mouth every 6 (six) hours as needed for pain.         .  varenicline (CHANTIX) 1 MG tablet  Take 1 mg by mouth 2 (two) times daily.         .  Vitamins-Lipotropics (B COMPLEX FORMULA 1 PO)  Take 1 tablet by mouth daily.           .  [DISCONTINUED] metoprolol (LOPRESSOR)  50 MG tablet  Take 50 mg by mouth 2 (two) times daily.         .  [DISCONTINUED] oxyCODONE-acetaminophen (PERCOCET/ROXICET) 5-325 MG per tablet  Take 2 tablets by mouth every 4 (four) hours as needed for pain.            Home: Home Living Lives With:  Alone Available Help at Discharge: Family (lives in addition onto brother's home, calls brother at time) Type of Home: Other (Comment) (addition on home) Home Access: Stairs to enter (concrete blocks, wide enough for walker) Entrance Stairs-Number of Steps: 3 Entrance Stairs-Rails: None Home Layout: One level Bathroom Shower/Tub: Forensic scientist: Standard Bathroom Accessibility: Yes How Accessible: Accessible via walker;Other (comment) (only if turn walker sideways) Home Adaptive Equipment: Tub transfer bench;Bedside commode/3-in-1;Walker - standard;Walker - rolling    Functional History: Prior Function Able to Take Stairs?: Yes Driving: Yes Vocation: On disability Comments: walking sometimes without std walker   Functional Status:   Mobility: Bed Mobility Bed Mobility: Supine to Sit;Sitting - Scoot to Edge of Bed Supine to Sit: 6: Modified independent (Device/Increase time);HOB flat Sitting - Scoot to Edge of Bed: 6: Modified independent (Device/Increase time) Transfers Transfers: Sit to Stand;Stand to [employer] Transfers Sit to Stand: 4: Min assist;With upper extremity assist;With armrests;From bed;From chair/3-in-1 Stand to Sit: 4: Min assist;With upper extremity assist;With armrests;To chair/3-in-1 Stand Pivot Transfers: 4: Min assist Ambulation/Gait Ambulation/Gait Assistance: 4: Min assist Ambulation Distance (Feet): 16 Feet Assistive device: Rolling walker Ambulation/Gait Assistance Details: Cues for safe use of RW, not to jerk RW around when navigating around obstables/between tight spaces, cues to ensure balance before taking next hop.  (A) for balance & safety.    Gait Pattern: Step-to pattern Stairs: No Wheelchair Mobility Wheelchair Mobility: No   ADL: ADL Eating/Feeding: Independent Where Assessed - Eating/Feeding: Chair Grooming: Performed;Wash/dry hands;Wash/dry face;Modified independent Where Assessed - Grooming:  Unsupported sitting Upper Body Bathing: Simulated;Set up Where Assessed - Upper Body Bathing: Unsupported sitting Lower Body Bathing: Minimal assistance Where Assessed - Lower Body Bathing: Lean right and/or left Upper Body Dressing: Simulated;Set up Where Assessed - Upper Body Dressing: Unsupported sitting Lower Body Dressing: Simulated;Moderate assistance;Minimal assistance Where Assessed - Lower Body Dressing: Lean right and/or left Toilet Transfer: Simulated;Minimal assistance Toilet Transfer Method: Stand pivot Toilet Transfer Equipment: Bedside commode Transfers/Ambulation Related to ADLs: Patient performs transfers with RW and min A. ADL Comments: Patient with good flexibility to R foot and can don/doff sock. Good mobility leaning left/right. Reports phantom sensations L residual limb.   Cognition: Cognition Overall Cognitive Status: Within Functional Limits for tasks assessed (? if pt completely aware of (A) he will need at d/c.  ) Arousal/Alertness: Awake/alert Orientation Level: Oriented X4 Cognition Arousal/Alertness: Awake/alert Behavior During Therapy: WFL for tasks assessed/performed Overall Cognitive Status: Within Functional Limits for tasks assessed (? if pt completely aware of (A) he will need at d/c.  )   Physical Exam: Blood pressure 138/80, pulse 82, temperature 98.4 F (36.9 C), temperature source Oral, resp. rate 18, height 5\' 11"  (1.803 m), weight 66.2 kg (145 lb 15.1 oz), SpO2 100.00%. Physical Exam  Vitals reviewed.   Constitutional: He is oriented to person, place, and time.  57 year old male appearing older than stated age   HENT:   Head: Normocephalic.   Eyes: EOM are normal.   Neck: Neck supple. Thyromegaly present.   Cardiovascular: Normal rate and regular rhythm.   Pulmonary/Chest: Effort normal and breath sounds normal. No respiratory distress.  Abdominal: Soft. Bowel sounds are normal. He exhibits no distension.  Neurological: He is alert  and oriented to person, place, and time.  Patient was able name person place as well as date of birth but somewhat slow to process. He was appropriate during exam with flat affect. UE's grossly 4/5. Left HF antigravity. RLE is grossly 3to 4/5. Senses pain and light touch in all 4.   Skin:  Left below knee amputation site is dressed with limb guard in place  Psychiatric: He has a normal mood and affect. His behavior is normal      Results for orders placed during the hospital encounter of 12/06/12 (from the past 48 hour(s))   BASIC METABOLIC PANEL     Status: Abnormal     Collection Time      12/12/12  5:46 AM       Result  Value  Range     Sodium  127 (*)  135 - 145 mEq/L     Potassium  3.6   3.5 - 5.1 mEq/L     Chloride  91 (*)  96 - 112 mEq/L     CO2  25   19 - 32 mEq/L     Glucose, Bld  127 (*)  70 - 99 mg/dL     BUN  6   6 - 23 mg/dL     Creatinine, Ser  0.86   0.50 - 1.35 mg/dL     Calcium  9.3   8.4 - 10.5 mg/dL     GFR calc non Af Amer  >90   >90 mL/min     GFR calc Af Amer  >90   >90 mL/min     Comment:                The eGFR has been calculated        using the CKD EPI equation.        This calculation has not been        validated in all clinical        situations.        eGFR's persistently        <90 mL/min signify        possible Chronic Kidney Disease.   CBC     Status: Abnormal     Collection Time      12/12/12  5:46 AM       Result  Value  Range     WBC  11.2 (*)  4.0 - 10.5 K/uL     RBC  3.47 (*)  4.22 - 5.81 MIL/uL     Hemoglobin  10.0 (*)  13.0 - 17.0 g/dL     HCT  57.8 (*)  46.9 - 52.0 %     MCV  83.3   78.0 - 100.0 fL     MCH  28.8   26.0 - 34.0 pg     MCHC  34.6   30.0 - 36.0 g/dL     RDW  62.9   52.8 - 15.5 %     Platelets  241   150 - 400 K/uL   URINALYSIS, ROUTINE W REFLEX MICROSCOPIC     Status: None     Collection Time      12/13/12  1:45 AM       Result  Value  Range     Color, Urine  YELLOW   YELLOW     APPearance  CLEAR   CLEAR  Specific Gravity, Urine  1.009   1.005 - 1.030     pH  7.0   5.0 - 8.0     Glucose, UA  NEGATIVE   NEGATIVE mg/dL     Hgb urine dipstick  NEGATIVE   NEGATIVE     Bilirubin Urine  NEGATIVE   NEGATIVE     Ketones, ur  NEGATIVE   NEGATIVE mg/dL     Protein, ur  NEGATIVE   NEGATIVE mg/dL     Urobilinogen, UA  0.2   0.0 - 1.0 mg/dL     Nitrite  NEGATIVE   NEGATIVE     Leukocytes, UA  NEGATIVE   NEGATIVE     Comment:  MICROSCOPIC NOT DONE ON URINES WITH NEGATIVE PROTEIN, BLOOD, LEUKOCYTES, NITRITE, OR GLUCOSE <1000 mg/dL.   CREATININE, SERUM     Status: None     Collection Time      12/13/12  5:00 AM       Result  Value  Range     Creatinine, Ser  0.63   0.50 - 1.35 mg/dL     GFR calc non Af Amer  >90   >90 mL/min     GFR calc Af Amer  >90   >90 mL/min     Comment:                The eGFR has been calculated        using the CKD EPI equation.        This calculation has not been        validated in all clinical        situations.        eGFR's persistently        <90 mL/min signify        possible Chronic Kidney Disease.    No results found.   Post Admission Physician Evaluation: Functional deficits secondary  to Left BKA. Patient is admitted to receive collaborative, interdisciplinary care between the physiatrist, rehab nursing staff, and therapy team. Patient's level of medical complexity and substantial therapy needs in context of that medical necessity cannot be provided at a lesser intensity of care such as a SNF. Patient has experienced substantial functional loss from his/her baseline which was documented above under the "Functional History" and "Functional Status" headings.  Judging by the patient's diagnosis, physical exam, and functional history, the patient has potential for functional progress which will result in measurable gains while on inpatient rehab.  These gains will be of substantial and practical use upon discharge  in facilitating mobility and self-care at the  household level. Physiatrist will provide 24 hour management of medical needs as well as oversight of the therapy plan/treatment and provide guidance as appropriate regarding the interaction of the two. 24 hour rehab nursing will assist with bladder management, bowel management, safety, skin/wound care, disease management, medication administration, pain management and patient education  and help integrate therapy concepts, techniques,education, etc. PT will assess and treat for/with: Pre gait,gait,strengtherning, stump wrapping, equip, safety, endurance.   Goals are: S/Mod I Mobility. OT will assess and treat for/with: ADL,Cog/percept,safety, endurance, equipment.   Goals are: Mod I ADL WC level. SLP will assess and treat for/with: NA.  Goals are: NA. Case Management and Social Worker will assess and treat for psychological issues and discharge planning. Team conference will be held weekly to assess progress toward goals and to determine barriers to discharge. Patient will receive at least 3 hours of therapy per  day at least 5 days per week. ELOS: 7-10 days      Prognosis:  good     Medical Problem List and Plan: 1. 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 DVT Prophylaxis/Anticoagulation: Subcutaneous Lovenox. Monitor platelet counts any signs of bleeding 3. Pain Management: Percocet as needed. Monitor with increased mobility 4. Mood: Zoloft 100 mg daily, Klonopin 1 mg daily as needed. Provide emotional support and positive reinforcement 5. Neuropsych: This patient is capable of making decisions on his/her own behalf. 6. Postoperative anemia. Latest hemoglobin of 10. Followup CBC 7. Hypertension. Vasotec 20 mg daily, hydrochlorothiazide 25 mg daily. Monitor with increased activity 8. Hyperlipidemia. Zocor 9. Tobacco abuse. Chantix 1 mg twice a day. Provide counseling 10. GERD. Protonix     Seen and examined 12/13/2012   Completed documentation 12/15/18`4

## 2012-12-14 NOTE — Progress Notes (Signed)
Patient information reviewed and entered into e[facility] system by Gabriellah Rabel, RN, CRRN, PPS Coordinator.  Information including medical coding and functional independence measure will be reviewed and updated through discharge.     Per nursing patient was given "Data Collection Information Summary for Patients in Inpatient Rehabilitation Facilities with attached "Privacy Act Statement-Health Care Records" upon admission.  

## 2012-12-14 NOTE — Evaluation (Signed)
Physical Therapy Assessment and Plan  Patient Details  Name: Andrew [name] MRN: [id number] Date of Birth: [birth year]  PT Diagnosis: Difficulty walking and Muscle weakness Rehab Potential: Good ELOS: 5-7 days   Today's Date: 12/14/2012 Time: 1300-1400 Time Calculation (min): 60 min  Problem List:  Patient Active Problem List   Diagnosis Date Noted  . S/P BKA (below knee amputation) 12/14/2012  . Abnormality of gait 11/10/2012  . Acute gingivitis 09/03/2012  . Atherosclerosis of native arteries of the extremities with intermittent claudication 06/08/2011  . Preventative health care 09/15/2010  . [facility] COMPLICATION DUE CORONARY BYPASS GRAFT 06/10/2010  . CHEST PAIN 03/04/2010  . 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 OTHER MALIGNANT NEOPLASM OF SKIN OF LIP 01/23/2009  . OTHER DENTAL CARIES 03/14/2008  . DYSLIPIDEMIA 05/10/2006  . ANXIETY 05/10/2006  . TOBACCO ABUSE 05/10/2006  . HYPERTENSION, ESSENTIAL NOS 05/10/2006  . PERIPHERAL VASCULAR DISEASE 05/10/2006  . BACK PAIN 05/10/2006  . 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 GLUCOSE INTOLERANCE, HX OF 05/10/2006    Past Medical History:  Past Medical History  Diagnosis Date  . Tobacco abuse   . 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 Manson Passey).  Pt will likely require a wide resection and  has been refered to ENT (2/12)  . Dental caries   . Glucose intolerance (impaired glucose tolerance)     Max random CBG 130 (08/29/07) typically about 100. A1C in 11/08 was 5.4.  . Peripheral vascular disease   . Hypertension, essential   . Dyslipidemia   . Anxiety   . Thoracic kyphosis   . Back pain      The patient has chronic lumbar/thoracic back pain, he has kyphosis, mild facet hypertrophy at L1-L2, circumferential annular bulging and mild vertebral body osteophytic formation at L2-L3, mild to moderate stenosis of the medial aspect of the neural foramen  at L4-L5, and mild annular bulging at L5-S1.  Marland Kitchen History of alcohol abuse   . Hyperlipidemia   . Peripheral arterial disease   . Leg pain   . Back pain   . Ulcer   . Diabetes mellitus     borderline yrs ago  . GERD (gastroesophageal reflux disease)    Past Surgical History:  Past Surgical History  Procedure Laterality Date  . Carotid endarterectomy    . Penile prosthesis implant  1985    secondary to a forklift acciednt and crushed pelvis  . Pr vein bypass graft,aorto-fem-pop  2003    Dr. Madilyn Fireman  . Pr vein bypass graft,aorto-fem-pop  05/12/11    Left fem-peroneal BPG   . Bypass graft femoral-peroneal  06/12/2012    Procedure: BYPASS GRAFT FEMORAL-PERONEAL;  Surgeon: Chuck Hint, MD;  Location: [facility] OR;  Service: Vascular;  Laterality: Left;  Redo fermoral - peroneal artery bypass using     composite propaten 6mm x 80cm and left saphenous vein.  . Bypass graft femoral-peroneal Left 11/10/2012    Procedure: Thrombectomy and Revision Left FEMORAL-PERONEAL Bypass Graft;  Surgeon: Larina Earthly, MD;  Location: [facility] OR;  Service: Vascular;  Laterality: Left;  . Amputation Left 12/06/2012    Procedure: AMPUTATION DIGIT;  Surgeon: Chuck Hint, MD;  Location: [facility] OR;  Service: Vascular;  Laterality: Left;  2ND TOE  . Amputation Left 12/10/2012    Procedure: AMPUTATION BELOW KNEE ;  Surgeon: Larina Earthly, MD;  Location: [facility] OR;  Service: Vascular;  Laterality: Left;    Assessment & Plan Clinical Impression: Patient is a 57 y.o. year old male with recent admission to the hospital on 12/06/2012 with gangrenous foot. No relief with conservative care. Underwent left below-knee amputation 12/10/2012 per Dr. Edilia Bo. A limb guard was placed per [employer] prost[employer].  Patient transferred to CIR on 12/13/2012 .   Patient currently requires min with mobility secondary to muscle weakness, decreased cardiorespiratoy endurance and decreased standing balance.  Prior to hospitalization, patient was  independent  with mobility and lived with Alone in a Apartment home.  Home access is 3Stairs to enter.  Patient will benefit from skilled PT intervention to maximize safe functional mobility, minimize fall risk and decrease caregiver burden for planned discharge home with intermittent assist.  Anticipate patient will benefit from follow up OP at discharge.  PT - End of Session Activity Tolerance: Tolerates 30+ min activity with multiple rests Endurance Deficit: Yes PT Assessment Rehab Potential: Good PT Plan PT Intensity: Minimum of 1-2 x/day ,45 to 90 minutes PT Frequency: 5 out of 7 days PT Duration Estimated Length of Stay: 5-7 days PT Treatment/Interventions: Ambulation/gait training;Balance/vestibular training;Community reintegration;Patient/family education;Stair training;UE/LE Coordination activities;UE/LE Strength taining/ROM;Splinting/orthotics;Pain management;DME/adaptive equipment instruction;Neuromuscular re-education;Therapeutic Exercise;Wheelchair propulsion/positioning;Therapeutic Activities;Functional mobility training;Discharge planning PT Recommendation Follow Up Recommendations: Outpatient PT Patient destination: Home Equipment Recommended: Wheelchair (measurements)  Skilled Therapeutic Intervention Pt performed functional transfers in ADL apartment to bed with close supervision, cuing for w/c parts management.  W/c mobility in home and controlled environments with supervision.  Gait training with RW in controlled environment short distance for improved activity tolerance with min steadying assist.  Curb step training with RW to simulate home entry.  Pt able to go up forwards with RW with min A.  Pt given handout of exercises and performed demo to PT.  Pt educated on desensitization of residual limb.  PT Evaluation Precautions/Restrictions Precautions Precautions: Fall Restrictions Weight Bearing Restrictions: No Pain Pain Assessment Pain Assessment: No/denies  pain Home Living/Prior Functioning Home Living Lives With: Alone Available Help at Discharge: Family;Available PRN/intermittently Type of Home: Apartment Home Access: Stairs to enter Entrance Stairs-Number of Steps: 3 Entrance Stairs-Rails: None Home Layout: One level Home Adaptive Equipment: Walker - standard;Walker - rolling Prior Function Level of Independence: Independent with basic ADLs;Independent with transfers;Independent with gait Able to Take Stairs?: Yes Driving: Yes Vocation: On disability  Cognition Overall Cognitive Status: Within Functional Limits for tasks assessed Sensation Sensation Light Touch: Impaired by gross assessment Proprioception: Appears Intact Coordination Gross Motor Movements are Fluid and Coordinated: Yes Fine Motor Movements are Fluid and Coordinated: Yes Motor  Motor Motor - Skilled Clinical Observations: generalized weakness  Mobility Bed Mobility Supine to Sit: 6: Modified independent (Device/Increase time) Transfers Sit to Stand: 4: Min assist Stand Pivot Transfers: 4: Min Designer, television/film set Transfers: 5: Supervision Locomotion  Ambulation Ambulation/Gait Assistance: 4: Min guard Ambulation Distance (Feet): 100 Feet Assistive device: Rolling walker Ambulation/Gait Assistance Details: steadying assist, pt with little foot clearance, sometimes sliding R LE but able to advance without LOB Stairs / Additional Locomotion Stairs: Yes Stairs Assistance: 4: Min assist Stair Management Technique: With walker;Forwards Number of Stairs: 2 Corporate treasurer: Yes Wheelchair Assistance: 5: Investment banker, operational: Both upper extremities Wheelchair Parts Management: Supervision/cueing Distance: 150  Trunk/Postural Assessment  Cervical Assessment Cervical Assessment: Within Film/video editor Assessment:  (kyphosis) Lumbar Assessment Lumbar Assessment: Within Functional  Limits Postural Control Postural Control: Within Functional Limits  Balance Static Standing Balance Static  Standing - Level of Assistance: 4: Min assist Dynamic Standing Balance Dynamic Standing - Level of Assistance: 4: Min assist Extremity Assessment      RLE Assessment RLE Assessment: Within Functional Limits LLE Assessment LLE Assessment:  (grossly 3-/5, knee extension limited by 15 degrees)  FIM:  FIM - Bed/Chair Transfer Bed/Chair Transfer: 5: Chair or W/C > Bed: Supervision (verbal cues/safety issues);5: Bed > Chair or W/C: Supervision (verbal cues/safety issues) FIM - Locomotion: Wheelchair Distance: 150 Locomotion: Wheelchair: 5: Travels 150 ft or more: maneuvers on rugs and over door sills with supervision, cueing or coaxing FIM - Locomotion: Ambulation Ambulation/Gait Assistance: 4: Min guard Locomotion: Ambulation: 2: Travels 50 - 149 ft with minimal assistance (Pt.>75%) FIM - Locomotion: Stairs Locomotion: Stairs: 1: Up and Down < 4 stairs with minimal assistance (Pt.>75%)   Refer to Care Plan for Long Term Goals  Recommendations for other services: None  Discharge Criteria: Patient will be discharged from PT if patient refuses treatment 3 consecutive times without medical reason, if treatment goals not met, if there is a change in medical status, if patient makes no progress towards goals or if patient is discharged from hospital.  The above assessment, treatment plan, treatment alternatives and goals were discussed and mutually agreed upon: by patient  DONAWERTH,KAREN 12/14/2012, 1:55 PM

## 2012-12-15 ENCOUNTER — Inpatient Hospital Stay (HOSPITAL_COMMUNITY): Payer: Medicare Other

## 2012-12-15 ENCOUNTER — Inpatient Hospital Stay (HOSPITAL_COMMUNITY): Payer: Medicare Other | Admitting: Physical Therapy

## 2012-12-15 DIAGNOSIS — F411 Generalized anxiety disorder: Secondary | ICD-10-CM

## 2012-12-15 DIAGNOSIS — S88119A Complete traumatic amputation at level between knee and ankle, unspecified lower leg, initial encounter: Secondary | ICD-10-CM

## 2012-12-15 DIAGNOSIS — G4700 Insomnia, unspecified: Secondary | ICD-10-CM

## 2012-12-15 MED ORDER — NORTRIPTYLINE HCL 25 MG PO CAPS
25.0000 mg | ORAL_CAPSULE | Freq: Every day | ORAL | Status: DC
  Administered 2012-12-15 – 2012-12-18 (×4): 25 mg via ORAL
  Filled 2012-12-15 (×5): qty 1

## 2012-12-15 MED ORDER — METHOCARBAMOL 500 MG PO TABS
500.0000 mg | ORAL_TABLET | Freq: Three times a day (TID) | ORAL | Status: DC | PRN
  Administered 2012-12-15 – 2012-12-21 (×13): 500 mg via ORAL
  Filled 2012-12-15 (×15): qty 1

## 2012-12-15 NOTE — Progress Notes (Signed)
Refused dressing change to stump asked between therapies and  After x3  Will continue to encourage

## 2012-12-15 NOTE — Progress Notes (Signed)
Andrew [name] is a 57 y.o. male [birth year] [id number]  Subjective: C/o "needles sensation" in the amputated L foot. Asking for a muscle relaxer. Slept ok. Feeling OK.  Objective: Vital signs in last 24 hours: Temp:  [97.7 F (36.5 C)-97.9 F (36.6 C)] 97.7 F (36.5 C) (05/10 0521) Pulse Rate:  [90-94] 94 (05/10 0521) Resp:  [18] 18 (05/10 0521) BP: (137-145)/(82-90) 137/90 mmHg (05/10 0521) SpO2:  [98 %-99 %] 98 % (05/10 0521) Weight change:  Last BM Date: 12/13/12  Intake/Output from previous day: 05/09 0701 - 05/10 0700 In: 960 [P.O.:960] Out: 3050 [Urine:3050] Last cbgs: CBG (last 3)  No results found for this basename: GLUCAP,  in the last 72 hours   Physical Exam General: No apparent distress    HEENT: moist mucosa Lungs: Normal effort. Lungs clear to auscultation, no crackles or wheezes. Cardiovascular: Regular rate and rhythm, no edema Musculoskeletal:  No change from before Neurological: No new neurological deficits Wounds: L BKA Skin: clear Alert, cooperative   Lab Results: BMET    Component Value Date/Time   NA 133* 12/14/2012 0630   K 4.2 12/14/2012 0630   CL 94* 12/14/2012 0630   CO2 28 12/14/2012 0630   GLUCOSE 106* 12/14/2012 0630   BUN 10 12/14/2012 0630   CREATININE 0.73 12/14/2012 0630   CALCIUM 9.9 12/14/2012 0630   GFRNONAA >90 12/14/2012 0630   GFRAA >90 12/14/2012 0630   CBC    Component Value Date/Time   WBC 8.4 12/14/2012 0630   RBC 3.80* 12/14/2012 0630   HGB 11.1* 12/14/2012 0630   HCT 32.6* 12/14/2012 0630   PLT 333 12/14/2012 0630   MCV 85.8 12/14/2012 0630   MCH 29.2 12/14/2012 0630   MCHC 34.0 12/14/2012 0630   RDW 13.8 12/14/2012 0630   LYMPHSABS 1.4 12/14/2012 0630   MONOABS 1.3* 12/14/2012 0630   EOSABS 0.4 12/14/2012 0630   BASOSABS 0.0 12/14/2012 0630    Studies/Results: No results found.  Medications: I have reviewed the patient's current medications.  Assessment/Plan: 1. 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 DVT  Prophylaxis/Anticoagulation: Subcutaneous Lovenox. Monitor platelet counts any signs of bleeding  3. Pain Management: Percocet as needed. Monitor with increased mobility  4. Mood: Zoloft 100 mg daily, Klonopin 1 mg daily as needed. Provide emotional support and positive reinforcement  5. Neuropsych: This patient is capable of making decisions on his/her own behalf.  6. Postoperative anemia. Latest hemoglobin of 10. Followup CBC  7. Hypertension. Vasotec 20 mg daily, hydrochlorothiazide 25 mg daily. Monitor with increased activity  8. Hyperlipidemia. Zocor  9. Tobacco abuse. Chantix 1 mg twice a day. Provide counseling  10. GERD. Protonix 11. LLE phantom pains. See meds    Length of stay, days: 2  Sonda Primes , MD 12/15/2012, 8:55 AM

## 2012-12-15 NOTE — Progress Notes (Signed)
Occupational Therapy Session Note  Patient Details  Name: Andrew [name] MRN: [id number] Date of Birth: [birth year]  Today's Date: 12/15/2012 Time: 0900-1000 (1500-1530) Time Calculation (min): 60 min ( )  Short Term Goals: Week 1:  OT Short Term Goal 1 (Week 1): STGs = LTGs based on short LOS  Skilled Therapeutic Interventions/Progress Updates:    Session 1: ADL re-training completed at sink level. Session with focus on standing balance, ADL performance, functional transfers, and Bil UE strength. Pt in a lot pain this AM. Pt completed all bathing and dressing tasks without safety concerns. Required assist only with steadying while standing. Education provided regarding limb guard. Pt verbalized understanding during demonstration. Pt returned to bed to rest before PT session.  Session 2: Therex session to increase Bil UE strength and endurance. See below for exercises. Pt transferred from recliner<>w/c at beginning and end of session with RW at Supervision level. No vc's for safety. No LOB. Pt was given green theraband for use in room.    Therapy Documentation Precautions:  Precautions Precautions: Fall Required Braces or Orthoses:  (limb guard) Restrictions Weight Bearing Restrictions: No Pain: Pain Assessment Pain Score:   6 Pain Location: Leg Pain Orientation: Left Pain Descriptors: Aching Pain Intervention(s): Medication (See eMAR) Exercises: General Exercises - Upper Extremity Shoulder Flexion: Theraband;20 reps;Both Theraband Level (Shoulder Flexion): Level 3 (Green) Shoulder Extension: Theraband;20 reps;Both Theraband Level (Shoulder Extension): Level 3 (Green) Shoulder Horizontal ABduction: Theraband;20 reps;Both Theraband Level (Shoulder Horizontal Abduction): Level 3 (Green) Shoulder Horizontal ADduction: Theraband;20 reps;Both Theraband Level (Shoulder Horizontal Adduction): Level 3 (Green) Elbow Flexion: Theraband;20 reps;Both Theraband Level (Elbow Flexion):  Level 3 (Green) Elbow Extension: Theraband;20 reps;Both Theraband Level (Elbow Extension): Level 3 (Green) Chair Push Up: 10 reps;Both (Rt. foot off floor. 5 " hold) Other Treatments:    See FIM for current functional status  Therapy/Group: Individual Therapy  Limmie Patricia, OTR/L,CBIS   12/15/2012, 3:51 PM

## 2012-12-15 NOTE — Progress Notes (Signed)
Social Work  Social Work Assessment and Plan  Patient Details  Name: Andrew [name] MRN: [id number] Date of Birth: 06-07-1956  Today's Date: 12/15/2012  Problem List:  Patient Active Problem List   Diagnosis Date Noted  . S/P BKA (below knee amputation) 12/14/2012  . Abnormality of gait 11/10/2012  . Acute gingivitis 09/03/2012  . Atherosclerosis of native arteries of the extremities with intermittent claudication 06/08/2011  . Preventative health care 09/15/2010  . [facility] COMPLICATION DUE CORONARY BYPASS GRAFT 06/10/2010  . CHEST PAIN 03/04/2010  . 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 OTHER MALIGNANT NEOPLASM OF SKIN OF LIP 01/23/2009  . OTHER DENTAL CARIES 03/14/2008  . DYSLIPIDEMIA 05/10/2006  . ANXIETY 05/10/2006  . TOBACCO ABUSE 05/10/2006  . HYPERTENSION, ESSENTIAL NOS 05/10/2006  . PERIPHERAL VASCULAR DISEASE 05/10/2006  . BACK PAIN 05/10/2006  . 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 GLUCOSE INTOLERANCE, HX OF 05/10/2006   Past Medical History:  Past Medical History  Diagnosis Date  . Tobacco abuse   . 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 Manson Passey).  Pt will likely require a wide resection and  has been refered to ENT (2/12)  . Dental caries   . Glucose intolerance (impaired glucose tolerance)     Max random CBG 130 (08/29/07) typically about 100. A1C in 11/08 was 5.4.  . Peripheral vascular disease   . Hypertension, essential   . Dyslipidemia   . Anxiety   . Thoracic kyphosis   . Back pain      The patient has chronic lumbar/thoracic back pain, he has kyphosis, mild facet hypertrophy at L1-L2, circumferential annular bulging and mild vertebral body osteophytic formation at L2-L3, mild to moderate stenosis of the medial aspect of the neural foramen at L4-L5, and mild annular bulging at L5-S1.  Marland Kitchen History of alcohol abuse   . Hyperlipidemia   . Peripheral arterial disease   . Leg  pain   . Back pain   . Ulcer   . Diabetes mellitus     borderline yrs ago  . GERD (gastroesophageal reflux disease)    Past Surgical History:  Past Surgical History  Procedure Laterality Date  . Carotid endarterectomy    . Penile prosthesis implant  1985    secondary to a forklift acciednt and crushed pelvis  . Pr vein bypass graft,aorto-fem-pop  2003    Dr. Madilyn Fireman  . Pr vein bypass graft,aorto-fem-pop  05/12/11    Left fem-peroneal BPG   . Bypass graft femoral-peroneal  06/12/2012    Procedure: BYPASS GRAFT FEMORAL-PERONEAL;  Surgeon: Chuck Hint, MD;  Location: [facility] OR;  Service: Vascular;  Laterality: Left;  Redo fermoral - peroneal artery bypass using     composite propaten 6mm x 80cm and left saphenous vein.  . Bypass graft femoral-peroneal Left 11/10/2012    Procedure: Thrombectomy and Revision Left FEMORAL-PERONEAL Bypass Graft;  Surgeon: Larina Earthly, MD;  Location: [facility] OR;  Service: Vascular;  Laterality: Left;  . Amputation Left 12/06/2012    Procedure: AMPUTATION DIGIT;  Surgeon: Chuck Hint, MD;  Location: [facility] OR;  Service: Vascular;  Laterality: Left;  2ND TOE  . Amputation Left 12/10/2012    Procedure: AMPUTATION BELOW KNEE ;  Surgeon: Larina Earthly, MD;  Location: [facility] OR;  Service: Vascular;  Laterality: Left;   Social History:  reports that he has been smoking Cigarettes.  He has  a 20 pack-year smoking history. He has never used smokeless tobacco. He reports that  drinks alcohol. He reports that he does not use illicit drugs.  Family / Support Systems Marital Status: Single Patient Roles:  (Brother, son) Other Supports: sister, Isidoro Donning @ ([phone];  mother, Frances Nickels @ [phone] or [phone];  brother, Thayer Ohm Anticipated Caregiver: Goals are modified independent. Family can provide intermittent assist. Ability/Limitations of Caregiver: family can only provide intermittent assistance;  accessibility of home if unable to go up steps to  enter Caregiver Availability: Intermittent Family Dynamics: pt describes family as supportive, however, limited in assistance he could provide.  Mother has prosthetic LE as well and "... her own health problems".    Social History Preferred language: English Religion: Christian Cultural Background: NA Education: quit 11th grade Read: Yes Write: Yes Employment Status: Disabled Date Retired/Disabled/Unemployed: pt guessing he has received SSD a little over 2 years Legal Hisotry/Current Legal Issues: none Guardian/Conservator: none   Abuse/Neglect Physical Abuse: Denies Verbal Abuse: Denies Sexual Abuse: Denies Exploitation of patient/patient's resources: Denies Self-Neglect: Denies  Emotional Status Pt's affect, behavior adn adjustment status: Pt pleasnat, talkative throughout, however, occasionally he seems to lose his thought and ramble onto another topic.  Frequently returns to topic of pain meds and management.  Very remorseful about the fact that over the years he has continued to smoke despite MD warnings that it would "lead to this" (pointing at BKA).  Self -deprecating about his "weakness" for cigarettes "...but I'm stopped now because I don't want to lose this other leg."  Deferred formal depression screen at present because pt denies and s/s of depression, however, will monitor and may request neuropsych eval for coping.Marland Kitchen Recent Psychosocial Issues: declining health Pyschiatric History: none Substance Abuse History: as noted, pt notes long h/o tobacco abuse - chart indicates ETOH abuse hx as well  Patient / Family Perceptions, Expectations & Goals Pt/Family understanding of illness & functional limitations: Pt with good, basic understanding of medical issues with circulation which have resulted in BKA and health changes he needs to make.  Does not feel he needs any outside support with smoking cessation.  On chantix currently. Premorbid pt/family roles/activities: Pt was  independent PTA but declining function.  Living in "add on" addition to brother's home.  family assisting with transportation as needed. Anticipated changes in roles/activities/participation: Goals set for mod i so little change anticipated.  Family may need to initially provide some increased, intermittent support. Pt/family expectations/goals: "I just don't want to get sent home too early.  I want this leg straighter."  [employer]: None Premorbid Home Care/DME Agencies: None Transportation available at discharge: yes Resource referrals recommended: Psychology;Support group (specify)  Discharge Planning Living Arrangements: Alone ("In-law" apt on brother's home) Support Systems: Other relatives Type of Residence: Private residence Insurance Resources: Medicare;Medicaid (specify county) Surveyor, quantity Resources: Ambulance person Screen Referred: No Living Expenses: Lives with family Money Management: Patient Do you have any problems obtaining your medications?: No Home Management: pt Patient/Family Preliminary Plans: Pt plans to return to his apt at brother's home with intermittent assist of family Barriers to Discharge: Steps Social Work Anticipated Follow Up Needs: Support Group;HH/OP Expected length of stay: 7 days  Clinical Impression Pleasant, talkative, oriented gentleman here after BKA.  Regretful his continued tobacco abuse led to his current situation. Will monitor emotional adjustment while here.  May benefit from peer support.  Anticipate short LOS with Mod independent goals.   Vincen Bejar 12/15/2012, 2:10  PM

## 2012-12-15 NOTE — Progress Notes (Signed)
[facility] Individual Statement of Services  Patient Name:  Andrew [name]  Date:  12/15/2012  Welcome to the [facility].  Our goal is to provide you with an individualized program based on your diagnosis and situation, designed to meet your specific needs.  With this comprehensive rehabilitation program, you will be expected to participate in at least 3 hours of rehabilitation therapies Monday-Friday, with modified therapy programming on the weekends.  Your rehabilitation program will include the following services:  Physical Therapy (PT), Occupational Therapy (OT), 24 hour per day rehabilitation nursing, Therapeutic Recreaction (TR), Psychology, Case Management (Social Worker), Rehabilitation Medicine, Nutrition Services and Pharmacy Services  Weekly team conferences will be held on Wednesdays to discuss your progress.  Your Social Worker will talk with you frequently to get your input and to update you on team discussions.  Team conferences with you and your family in attendance may also be held.  Expected length of stay: one week  Overall anticipated outcome: modified independent  Depending on your progress and recovery, your program may change. Your Social Worker will coordinate services and will keep you informed of any changes. Your Social Worker's name and contact numbers are listed  below.  The following services may also be recommended but are not provided by the [facility]:   Driving Evaluations  Home Health Rehabiltiation Services  Outpatient Rehabilitatation [facility]  Vocational Rehabilitation   Arrangements will be made to provide these services after discharge if needed.  Arrangements include referral to agencies that provide these services.  Your insurance has been verified to be:  Medicare and Medicaid Your primary doctor is:  Janalyn Harder @ [facility]  Pertinent information will be shared  with your doctor and your insurance company.  Social Worker:  [place], [place] [phone] or ([phone]  Information discussed with and copy given to patient by: Amada Jupiter, 12/15/2012, 2:11 PM

## 2012-12-15 NOTE — Progress Notes (Signed)
Physical Therapy Session Note  Patient Details  Name: Andrew [name] MRN: [id number] Date of Birth: 1956/06/28  Today's Date: 12/15/2012 Time: 1104-1200 Time Calculation (min): 56 min  Skilled Therapeutic Interventions/Progress Updates:   Pt reported that he felt like he over did therapy yesterday.  Complaining with phantom pain keeping him up last night and bothering him today. Transfers recliner to w/c to mat with close supervision. Performed BKA exercises, 10 reps each.  Quad sets, SLR, glut sets, hip extension, hip ab/adduction, and LAQ. Gait training with RW x 200' on unit with supervision.   Therapy Documentation Precautions:  Precautions Precautions: Fall Required Braces or Orthoses:  (limb guard) Restrictions Weight Bearing Restrictions: No    Pain: Pain level 3/10 See FIM for current functional status  Therapy/Group: Individual Therapy  Georges Mouse 12/15/2012, 12:04 PM

## 2012-12-15 NOTE — Progress Notes (Signed)
Physical Therapy Note  Patient Details  Name: YONATAN GUITRON MRN: [id number] Date of Birth: [birth year] Today's Date: 12/15/2012  1300-1330 (30 minutes) individual Pain: 4/10 LT BKA/ meds given Focus of treatment: therapeutic exercises focused on activity tolerance; gait training (steps) Treatment: Transfers SBA RW; Nustep Level 4 X 10 minutes ; up/down 3 steps with RW min assist for balance (backwards).   Rayaan Garguilo,JIM 12/15/2012, 1:17 PM

## 2012-12-16 ENCOUNTER — Inpatient Hospital Stay (HOSPITAL_COMMUNITY): Payer: Medicare Other | Admitting: *Deleted

## 2012-12-16 DIAGNOSIS — I10 Essential (primary) hypertension: Secondary | ICD-10-CM

## 2012-12-16 MED ORDER — OXYCODONE HCL 5 MG PO TABS
10.0000 mg | ORAL_TABLET | ORAL | Status: DC | PRN
  Administered 2012-12-16 – 2012-12-21 (×26): 10 mg via ORAL
  Filled 2012-12-16 (×26): qty 2

## 2012-12-16 NOTE — Progress Notes (Signed)
Occupational Therapy Session Note  Patient Details  Name: Andrew [name] MRN: [id number] Date of Birth: Jan 04, 1956  Today's Date: 12/16/2012 Time:  -   1030-1130  (60 min) Pain:  5/10  left leg  ;(See MAR)    Short Term Goals: Week 1:  OT Short Term Goal 1 (Week 1): STGs = LTGs based on short LOS  Skilled Therapeutic Interventions/Progress Updates:    Pt. Sitting in wc upon OT arrival visiting with friend.  Addressed functional mobility with RW to bathroom with close supervision.  Pt.stood up after toileting without calling.  Reminded pt to call when standing and/or walking.  Ambulated to sink and sat in wc for bathing and dressing.  Performed standing balance to wash peri area and don pants.  Pt. Reported he has fallen in the past and does not want to fall again.  Pt. Stood x2 to wash periara with one hand holding to sink and other hand washing with close supervision.  Pt. Took increased time for bathing and dressing.      Therapy Documentation Precautions:  Precautions Precautions: Fall Required Braces or Orthoses:  (limb guard) Restrictions Weight Bearing Restrictions: No       Pain: Pain Assessment Pain Assessment: 0-10 Pain Score:   5 Pain Type: Surgical pain Pain Location: Leg Pain Orientation: Left Pain Descriptors: Aching Pain Frequency: Intermittent Pain Onset: Gradual Pain Intervention(s): Medication (See eMAR) ADL:   Exercises:   Other Treatments:    See FIM for current functional status  Therapy/Group: Individual Therapy  Humberto Seals 12/16/2012, 10:39 AM

## 2012-12-16 NOTE — Progress Notes (Signed)
Dr. Amador Cunas notified of patient receiving Percocet 2 tabs q 4 hrs and have reached limit for Tylenol.  Orders received: d/c Percocet; give Oxycodone IR 10 mg q 4 hrs prn pain.

## 2012-12-16 NOTE — Progress Notes (Signed)
Patient ID: Andrew [name], male   DOB: 05-Oct-1955, 57 y.o.   MRN: [id number] Andrew [name] is a 57 y.o. male 08/01/1956 [id number]  Subjective: C/o "needles sensation" in the amputated L foot. Asking for a muscle relaxer. Slept ok. C/o L stump dressing feeling tight.  Objective: Vital signs in last 24 hours: Temp:  [98 F (36.7 C)-98.1 F (36.7 C)] 98 F (36.7 C) (05/11 0530) Pulse Rate:  [82-101] 82 (05/11 0530) Resp:  [16-18] 16 (05/11 0530) BP: (127-147)/(77-89) 147/89 mmHg (05/11 0530) SpO2:  [99 %] 99 % (05/11 0530) Weight change:  Last BM Date: 12/15/12  Intake/Output from previous day: 05/10 0701 - 05/11 0700 In: 790 [P.O.:790] Out: 1800 [Urine:1800] Last cbgs: CBG (last 3)  No results found for this basename: GLUCAP,  in the last 72 hours   Physical Exam General: No apparent distress    HEENT: moist mucosa Lungs: Normal effort. Lungs clear to auscultation, no crackles or wheezes. Cardiovascular: Regular rate and rhythm, no edema Musculoskeletal:  No change from before Neurological: No new neurological deficits Wounds: L BKA; standard dressing Skin: clear Alert, cooperative   Lab Results: BMET    Component Value Date/Time   NA 133* 12/14/2012 0630   K 4.2 12/14/2012 0630   CL 94* 12/14/2012 0630   CO2 28 12/14/2012 0630   GLUCOSE 106* 12/14/2012 0630   BUN 10 12/14/2012 0630   CREATININE 0.73 12/14/2012 0630   CALCIUM 9.9 12/14/2012 0630   GFRNONAA >90 12/14/2012 0630   GFRAA >90 12/14/2012 0630   CBC    Component Value Date/Time   WBC 8.4 12/14/2012 0630   RBC 3.80* 12/14/2012 0630   HGB 11.1* 12/14/2012 0630   HCT 32.6* 12/14/2012 0630   PLT 333 12/14/2012 0630   MCV 85.8 12/14/2012 0630   MCH 29.2 12/14/2012 0630   MCHC 34.0 12/14/2012 0630   RDW 13.8 12/14/2012 0630   LYMPHSABS 1.4 12/14/2012 0630   MONOABS 1.3* 12/14/2012 0630   EOSABS 0.4 12/14/2012 0630   BASOSABS 0.0 12/14/2012 0630    Studies/Results: No results found.  Medications: I have reviewed the patient's  current medications.  Assessment/Plan: 1. 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 DVT Prophylaxis/Anticoagulation: Subcutaneous Lovenox. Monitor platelet counts any signs of bleeding  3. Pain Management: Percocet as needed. Monitor with increased mobility  4. Mood: Zoloft 100 mg daily, Klonopin 1 mg daily as needed. Provide emotional support and positive reinforcement  5. Neuropsych: This patient is capable of making decisions on his/her own behalf.  6. Postoperative anemia. Latest hemoglobin of 10. Followup CBC  7. Hypertension. Vasotec 20 mg daily, hydrochlorothiazide 25 mg daily. Monitor with increased activity  8. Hyperlipidemia. Zocor  9. Tobacco abuse. Chantix 1 mg twice a day. Provide counseling  10. GERD. Protonix 11. LLE phantom pains. See meds    Length of stay, days: 3  Sonda Primes , MD 12/16/2012, 9:10 AM

## 2012-12-17 ENCOUNTER — Inpatient Hospital Stay (HOSPITAL_COMMUNITY): Payer: Medicare Other | Admitting: Occupational Therapy

## 2012-12-17 ENCOUNTER — Inpatient Hospital Stay (HOSPITAL_COMMUNITY): Payer: Medicare Other

## 2012-12-17 DIAGNOSIS — L98499 Non-pressure chronic ulcer of skin of other sites with unspecified severity: Secondary | ICD-10-CM

## 2012-12-17 DIAGNOSIS — I739 Peripheral vascular disease, unspecified: Secondary | ICD-10-CM

## 2012-12-17 DIAGNOSIS — S88119A Complete traumatic amputation at level between knee and ankle, unspecified lower leg, initial encounter: Secondary | ICD-10-CM

## 2012-12-17 MED ORDER — TEMAZEPAM 7.5 MG PO CAPS
7.5000 mg | ORAL_CAPSULE | Freq: Every evening | ORAL | Status: DC | PRN
  Administered 2012-12-17 – 2012-12-20 (×3): 7.5 mg via ORAL
  Filled 2012-12-17 (×3): qty 1

## 2012-12-17 NOTE — Progress Notes (Signed)
Dr. Lesia Hausen notified of patient's BP 161/101 and due Vasotec 20 mg, Hydrochlorothiazide 25 mg  At 0800.  Order received:  Ok to give Vasotec and Hydrochlorothiazide now for elevated BP.

## 2012-12-17 NOTE — Progress Notes (Signed)
Patient ID: Andrew [name], male   DOB: 09-16-1955, 57 y.o.   MRN: [id number] Subjective/Complaints: 57 y.o. right-handed male with history of peripheral vascular disease and multiple revascularization procedures. Most recently amputation of left second toe secondary to osteomyelitis as well as thrombectomy of left femoral to peroneal bypass revision with angioplasty in early April 2014. Presented 12/06/2012 with gangrenous foot. No relief with conservative care. Underwent left below-knee amputation 12/10/2012 per Dr. Edilia Bo. A limb guard was placed per [employer] prost[employer]. Postoperative pain management. Acute blood loss anemia after surgery latest hemoglobin of 10 and monitored. Placed on subcutaneous Lovenox for DVT prophylaxis.   Asking bout dressing changes Objective: Vital Signs: Blood pressure 161/101, pulse 109, temperature 98 F (36.7 C), temperature source Oral, resp. rate 18, SpO2 100.00%. No results found. No results found for this or any previous visit (from the past 72 hour(s)).    Blood pressure 138/80, pulse 82, temperature 98.4 F (36.9 C), temperature source Oral, resp. rate 18, height 5\' 11"  (1.803 m), weight 66.2 kg (145 lb 15.1 oz), SpO2 100.00%.  Physical Exam  Vitals reviewed.  Constitutional: He is oriented to person, place, and time.  57 year old male appearing older than stated age  HENT:  Head: Normocephalic.  Eyes: EOM are normal.  Neck: Neck supple. Thyromegaly present.  Cardiovascular: Normal rate and regular rhythm.  Pulmonary/Chest: Effort normal and breath sounds normal. No respiratory distress.  Abdominal: Soft. Bowel sounds are normal. He exhibits no distension.  Neurological: He is alert and oriented to person, place, and time.  Patient was able name person place as well as date of birth but somewhat slow to process. He was appropriate during exam with flat affect. UE's grossly 4/5. Left HF antigravity. RLE is grossly 3to 4/5. Senses pain and light touch  in all 4.  Skin:  Left below knee amputation no drainage, no erythema Psychiatric: He has a normal mood and affect. His behavior is normal   Assessment/Plan: 1. Functional deficits secondary to Left BKA which require 3+ hours per day of interdisciplinary therapy in a comprehensive inpatient rehab setting. Physiatrist is providing close team supervision and 24 hour management of active medical problems listed below. Physiatrist and rehab team continue to assess barriers to discharge/monitor patient progress toward functional and medical goals. FIM: FIM - Bathing Bathing Steps Patient Completed: Chest;Right Arm;Left Arm;Abdomen;Front perineal area;Buttocks;Right upper leg;Left upper leg;Right lower leg (including foot) Bathing: 4: Steadying assist  FIM - Upper Body Dressing/Undressing Upper body dressing/undressing steps patient completed: Thread/unthread right sleeve of pullover shirt/dresss;Thread/unthread left sleeve of pullover shirt/dress;Put head through opening of pull over shirt/dress;Pull shirt over trunk Upper body dressing/undressing: 6: More than reasonable amount of time FIM - Lower Body Dressing/Undressing Lower body dressing/undressing steps patient completed: Thread/unthread right underwear leg;Pull underwear up/down;Thread/unthread left underwear leg;Thread/unthread right pants leg;Thread/unthread left pants leg;Pull pants up/down;Don/Doff right sock Lower body dressing/undressing: 4: Steadying Assist  FIM - Toileting Toileting steps completed by patient: Adjust clothing prior to toileting;Performs perineal hygiene;Adjust clothing after toileting Toileting: 5: Supervision: Safety issues/verbal cues  FIM - Archivist Transfers Assistive Devices: Art gallery manager Transfers: 5-To toilet/BSC: Supervision (verbal cues/safety issues);5-From toilet/BSC: Supervision (verbal cues/safety issues)  FIM - Bed/Chair Transfer Bed/Chair Transfer Assistive Devices:  Therapist, occupational: 5: Sit > Supine: Supervision (verbal cues/safety issues);5: Bed > Chair or W/C: Supervision (verbal cues/safety issues);5: Chair or W/C > Bed: Supervision (verbal cues/safety issues)  FIM - Locomotion: Wheelchair Distance: 150 Locomotion: Wheelchair: 1: Total Assistance/staff pushes wheelchair (Pt<25%) FIM - Locomotion:  Ambulation Locomotion: Ambulation Assistive Devices: Designer, industrial/product Ambulation/Gait Assistance: 5: Supervision Locomotion: Ambulation: 5: Travels 150 ft or more with supervision/safety issues  Comprehension Comprehension Mode: Auditory Comprehension: 7-Follows complex conversation/direction: With no assist  Expression Expression Mode: Verbal Expression: 6-Expresses complex ideas: With extra time/assistive device  Social Interaction Social Interaction: 7-Interacts appropriately with others - No medications needed.  Problem Solving Problem Solving: 6-Solves complex problems: With extra time  Memory Memory: 6-More than reasonable amt of time Medical Problem List and Plan:  1. 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 DVT Prophylaxis/Anticoagulation: Subcutaneous Lovenox. Monitor platelet counts any signs of bleeding  3. Pain Management: Percocet as needed. Monitor with increased mobility  4. Mood: Zoloft 100 mg daily, Klonopin 1 mg daily as needed. Provide emotional support and positive reinforcement  5. Neuropsych: This patient is capable of making decisions on his/her own behalf.  6. Postoperative anemia. Latest hemoglobin of 10. Followup CBC  7. Hypertension. Vasotec 20 mg daily, hydrochlorothiazide 25 mg daily. Monitor with increased activity  8. Hyperlipidemia. Zocor  9. Tobacco abuse. Chantix 1 mg twice a day. Provide counseling  10. GERD. Protonix   LOS (Days) 4 A FACE TO FACE EVALUATION WAS PERFORMED  Chelcey Caputo E 12/17/2012, 9:12 AM

## 2012-12-17 NOTE — Progress Notes (Signed)
Occupational Therapy Note  Patient Details  Name: Andrew [name] MRN: [id number] Date of Birth: [birth year] Today's Date: 12/17/2012  Time: 11:35am-12:10pm ( ) Pt seen for 1:1 OT session with focus on functional mobility and overall safety with transfers to multiple ADL surfaces. Pt in recliner upon arrival, with pain of 3-4/10 (pt had received medication last around 10am.) Pt maneuvered wheelchair to ADL apartment and rehearsed transfers to tub bench, bed, sofa and dining chair. Pt able to complete all transfers with S, although sometimes confused about which way to approach the transfer (directionally.) Pt is methodical with his movements and transfers and had no LOB during session. Pt does express some concern for his home environment, with therapist making suggestions (no GB's or hand held shower in bathtub, large dining table to maneuver around.)  Once in room, pt sat in recliner with legs raised, call bell in place and nurse in room.  Laina Guerrieri Hessie Diener 12/17/2012, 12:18 PM

## 2012-12-17 NOTE — Progress Notes (Signed)
Physical Therapy Session Note  Patient Details  Name: Andrew [name] MRN: [id number] Date of Birth: 17-Feb-1956  Today's Date: 12/17/2012 Time: 0905-1005 and 4098-1191 Time Calculation (min): 60 min and 45 min  Short Term Goals:= LTGs    Skilled Therapeutic Interventions/Progress Updates:   Treatment 1 focused on finishing self-care including dressing change R BK, donning sock and splint, mobility and locomotion.    Instructed pt in hamstring stretching LLE.  Pt reported his LE splint was uncomfortable last night, and he removed it during the night.  Fit is OK, but pt's LE is very thin, with resulting extra space within splint.  W/c mobility with supervision to/from room, working on efficiency of propulsion, turns.  Gait with RW x 150' with close supervision, cues for step length, forward gaze.  STanding balance x 30 seconds with min assist for continual trembling, minor LOB to L.    Treatment 2 focused on gait, therapeutic exercise performed with bil LE to increase strength for functional mobility including BKA exs, w/c mobility, high level gait.  Pt reported phantom pain L foot 7/10; instructed in phantom pain exs during bil knee ext with sheet covering LLE; no improvement in pain after 2 minutes.    Pt brought himself to therapy in w/c; supervision for backing up, best location of w/c for transfers and parts management.    Gait through obstacle course of 8 cones, supervision> min assist.  Gait up/down 3 (5") steps with 2 rails, x 2 backwards with close supervision/min assist.  Pt c/o bil buttocks L>R hurting when sitting in recliner in room, or in supine in bed.  Pt provided with gel w/c cushion, plus lumbar pad to help with positioning spine due to thoracic kyphosis.  Pt instructed in L sidelying using pillows for support and comfort.  Skin examined by PT; rash between buttocks, but no erythema/pressure area noted on L IT.    Therapy Documentation Precautions:  Precautions Precautions:  Fall Required Braces or Orthoses:  (limb guard) Restrictions Weight Bearing Restrictions: No   Pain: Pain Assessment Pain Score:   6   Locomotion : Ambulation Ambulation/Gait Assistance: 4: Min guard;5: Supervision     See FIM for current functional status  Therapy/Group: Individual Therapy  Chikita Dogan 12/17/2012, 2:34 PM

## 2012-12-17 NOTE — Progress Notes (Signed)
Occupational Therapy Session Note  Patient Details  Name: Andrew [name] MRN: [id number] Date of Birth: 1955/09/28  Today's Date: 12/17/2012 Time: 0800-0905 Time Calculation (min): 65 min  Short Term Goals: No short term goals set  Skilled Therapeutic Interventions/Progress Updates:      Pt seen for BADL retraining of toileting, bathing, and dressing with a focus on functional mobility with RW and standing balance.  Pt did well with all activities at a supervision level but does need cues to lock breaks at times.  He ambulated in and out of bathroom with RW with supervision.  Pt tends to move slowly and methodically with bathing, but is self directed and highly motivated.  Pt's PT had arrived for his next session.  Therapy Documentation Precautions:  Precautions Precautions: Fall Required Braces or Orthoses:  (limb guard) Restrictions Weight Bearing Restrictions: No    Pain: Pain Assessment Pain Assessment: 0-10 Pain Score:   9 Pain Type: Neuropathic pain Pain Location: Knee Pain Orientation: Left Pain Radiating Towards: hip Pain Descriptors: Aching Pain Onset: On-going Pain Intervention(s): Medication (See eMAR) ADL:  See FIM for current functional status  Therapy/Group: Individual Therapy  SAGUIER,JULIA 12/17/2012, 10:19 AM

## 2012-12-18 ENCOUNTER — Inpatient Hospital Stay (HOSPITAL_COMMUNITY): Payer: Medicare Other | Admitting: Occupational Therapy

## 2012-12-18 ENCOUNTER — Inpatient Hospital Stay (HOSPITAL_COMMUNITY): Payer: Medicare Other

## 2012-12-18 NOTE — Progress Notes (Signed)
Physical Therapy Note  Patient Details  Name: Andrew [name] MRN: [id number] Date of Birth: [birth year] Today's Date: 12/18/2012  2:00 - 2:45 45 minutes Individual session Patient reports pain in left leg at 5.  Treatment focused on ambulation, car transfers, and endurance training. Patient ambulated 150 feet x 3 with rolling walker and supervision. Patient performed simulated car transfer with supervision on passenger and driver side of vehicle. Patient practiced side stepping with RW to simulate entering narrow bathroom door way. Patient exercised on Nustep x 10 minutes on workload 5 using right LE and bilateral UE. Patient left in wheelchair in room with items in reach.   [name] M 12/18/2012, 3:47 PM

## 2012-12-18 NOTE — Progress Notes (Signed)
Patient ID: Andrew [name], male   DOB: 04/01/1956, 57 y.o.   MRN: [id number] Subjective/Complaints: 57 y.o. right-handed male with history of peripheral vascular disease and multiple revascularization procedures. Most recently amputation of left second toe secondary to osteomyelitis as well as thrombectomy of left femoral to peroneal bypass revision with angioplasty in early April 2014. Presented 12/06/2012 with gangrenous foot. No relief with conservative care. Underwent left below-knee amputation 12/10/2012 per Dr. Edilia Bo. A limb guard was placed per [employer]. Postoperative pain management. Acute blood loss anemia after surgery latest hemoglobin of 10 and monitored. Placed on subcutaneous Lovenox for DVT prophylaxis.  Wearing Limb guard again Objective: Vital Signs: Blood pressure 121/81, pulse 92, temperature 98.1 F (36.7 C), temperature source Oral, resp. rate 19, SpO2 95.00%. No results found. No results found for this or any previous visit (from the past 72 hour(s)).    Blood pressure 138/80, pulse 82, temperature 98.4 F (36.9 C), temperature source Oral, resp. rate 18, height 5\' 11"  (1.803 m), weight 66.2 kg (145 lb 15.1 oz), SpO2 100.00%.  Physical Exam  Vitals reviewed.  Constitutional: He is oriented to person, place, and time.  57 year old male appearing older than stated age  HENT:  Head: Normocephalic.  Eyes: EOM are normal.  Neck: Neck supple. Thyromegaly present.  Cardiovascular: Normal rate and regular rhythm.  Pulmonary/Chest: Effort normal and breath sounds normal. No respiratory distress.  Abdominal: Soft. Bowel sounds are normal. He exhibits no distension.  Neurological: He is alert and oriented to person, place, and time.  Patient was able name person place as well as date of birth but somewhat slow to process. He was appropriate during exam with flat affect. UE's grossly 4/5. Left HF antigravity. RLE is grossly 3to 4/5. Senses pain and light touch in all  4.  Skin:  Left below knee amputation no drainage, no erythema Psychiatric: He has a normal mood and affect. His behavior is normal   Assessment/Plan: 1. Functional deficits secondary to Left BKA which require 3+ hours per day of interdisciplinary therapy in a comprehensive inpatient rehab setting. Physiatrist is providing close team supervision and 24 hour management of active medical problems listed below. Physiatrist and rehab team continue to assess barriers to discharge/monitor patient progress toward functional and medical goals. FIM: FIM - Bathing Bathing Steps Patient Completed: Chest;Right Arm;Left Arm;Abdomen;Front perineal area;Buttocks;Right upper leg;Left upper leg;Right lower leg (including foot) Bathing: 5: Supervision: Safety issues/verbal cues  FIM - Upper Body Dressing/Undressing Upper body dressing/undressing steps patient completed: Thread/unthread right sleeve of pullover shirt/dresss;Thread/unthread left sleeve of pullover shirt/dress;Put head through opening of pull over shirt/dress;Pull shirt over trunk Upper body dressing/undressing: 6: More than reasonable amount of time FIM - Lower Body Dressing/Undressing Lower body dressing/undressing steps patient completed: Thread/unthread right underwear leg;Pull underwear up/down;Thread/unthread left underwear leg;Thread/unthread right pants leg;Thread/unthread left pants leg;Pull pants up/down;Don/Doff right sock Lower body dressing/undressing: 5: Supervision: Safety issues/verbal cues  FIM - Toileting Toileting steps completed by patient: Adjust clothing prior to toileting;Performs perineal hygiene;Adjust clothing after toileting Toileting: 5: Supervision: Safety issues/verbal cues  FIM - Archivist Transfers Assistive Devices: Art gallery manager Transfers: 5-To toilet/BSC: Supervision (verbal cues/safety issues);5-From toilet/BSC: Supervision (verbal cues/safety issues)  FIM - Programmer, systems Assistive Devices: Arm rests Bed/Chair Transfer: 5: Supine > Sit: Supervision (verbal cues/safety issues);5: Sit > Supine: Supervision (verbal cues/safety issues);5: Bed > Chair or W/C: Supervision (verbal cues/safety issues);5: Chair or W/C > Bed: Supervision (verbal cues/safety issues)  FIM - Locomotion: Wheelchair Distance: 150  Locomotion: Wheelchair: 5: Travels 150 ft or more: maneuvers on rugs and over door sills with supervision, cueing or coaxing FIM - Locomotion: Ambulation Locomotion: Ambulation Assistive Devices: Walker - Rolling;Orthosis (R BK knee extension splint) Ambulation/Gait Assistance: 4: Min guard;5: Supervision Locomotion: Ambulation: 5: Travels 150 ft or more with supervision/safety issues  Comprehension Comprehension Mode: Auditory Comprehension: 7-Follows complex conversation/direction: With no assist  Expression Expression Mode: Verbal Expression: 6-Expresses complex ideas: With extra time/assistive device  Social Interaction Social Interaction: 6-Interacts appropriately with others with medication or extra time (anti-anxiety, antidepressant).  Problem Solving Problem Solving: 6-Solves complex problems: With extra time  Memory Memory: 6-More than reasonable amt of time Medical Problem List and Plan:  1. 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 DVT Prophylaxis/Anticoagulation: Subcutaneous Lovenox. Monitor platelet counts any signs of bleeding  3. Pain Management: Percocet as needed. Monitor with increased mobility  4. Mood: Zoloft 100 mg daily, Klonopin 1 mg daily as needed. Provide emotional support and positive reinforcement  5. Neuropsych: This patient is capable of making decisions on his/her own behalf.  6. Postoperative anemia. Latest hemoglobin of 10. Followup CBC  7. Hypertension. Vasotec 20 mg daily, hydrochlorothiazide 25 mg daily. Monitor with increased activity  8. Hyperlipidemia. Zocor  9. Tobacco abuse.  Chantix 1 mg twice a day. Provide counseling  10. GERD. Protonix   LOS (Days) 5 A FACE TO FACE EVALUATION WAS PERFORMED  KIRSTEINS,ANDREW E 12/18/2012, 9:03 AM

## 2012-12-18 NOTE — Progress Notes (Signed)
Occupational Therapy Session Note  Patient Details  Name: Andrew [name] MRN: [id number] Date of Birth: 1955/10/08  Today's Date: 12/18/2012 Time: 0800-0900  and 1100-1130 Time Calculation (min): 60 min and 30 min  Short Term Goals: No short term goals set  Skilled Therapeutic Interventions/Progress Updates:    Visit 1:  Pt's pain levels 6/10, but he had just received his pain medications.   Pt seen for BADL retraining of toileting, bathing, and dressing with a focus on safe functional mobility with w/c and walker.  Pt was able to complete all tasks at a supervision level with RW.  Visit 2:  Pt seen this session to work on UE strengthening and activity tolerance needed for ambulation with RW.  Pt worked on w/c pushups 15 x3, standing balance, w/c mobility for increased distance, 4# dowel exercises for shoulders and biceps.  Pt tolerated all exercises well. He also reports that his family is rearranging his apt. To increase access.  Therapy Documentation Precautions:  Precautions Precautions: Fall Required Braces or Orthoses:  (limb guard) Restrictions Weight Bearing Restrictions: No  ADL:  See FIM for current functional status  Therapy/Group: Individual Therapy  SAGUIER,JULIA 12/18/2012, 9:56 AM

## 2012-12-18 NOTE — Progress Notes (Signed)
Recreational Therapy Session Note  Patient Details  Name: Andrew [name] MRN: [id number] Date of Birth: 25-Jan-1956 Today's Date: 12/18/2012  Pt participated in animal assisted activity/therapy seated w/c level with supervision. Ginnette Gates 12/18/2012, 3:10 PM

## 2012-12-18 NOTE — Plan of Care (Signed)
Problem: RH PAIN MANAGEMENT Goal: RH STG PAIN MANAGED AT OR BELOW PT'S PAIN GOAL </=4  Outcome: Not Progressing Pain level at 8 out of 10

## 2012-12-18 NOTE — Progress Notes (Signed)
Physical Therapy Note  Patient Details  Name: Andrew [name] MRN: [id number] Date of Birth: 1956-04-13 Today's Date: 12/18/2012  9:00 - 10:00 60 minutes Individual session Patient reports leg pain as 5.  Treatment focused on LE strengthening and ROM exercises and gait. Patient propelled self to gym 150 feet with bilateral UE's. Patient transferred wheelchair to mat with RW stand pivot with supervision. Patient supine <> sit independently. Patient performed LE active exercise x 12 reps each in supine: hip/knee flexion, hip abduction, quad sets and prone: knee flexion and hip extension. Patient laid prone x 10 minutes to stretch hip flexors and knee extension. Patient ambulated 200 feet with rolling walker and supervision. Patient ambulated up and down 4 wide steps using standard walker forwards with min steady assist.  Patient returned to room and left in wheelchair with items in reach.    [name] M 12/18/2012, 12:19 PM

## 2012-12-19 ENCOUNTER — Inpatient Hospital Stay (HOSPITAL_COMMUNITY): Payer: Medicare Other | Admitting: Occupational Therapy

## 2012-12-19 ENCOUNTER — Inpatient Hospital Stay (HOSPITAL_COMMUNITY): Payer: Medicare Other

## 2012-12-19 ENCOUNTER — Ambulatory Visit: Payer: Medicare Other | Admitting: Vascular Surgery

## 2012-12-19 ENCOUNTER — Inpatient Hospital Stay (HOSPITAL_COMMUNITY): Payer: Medicare Other | Admitting: Physical Therapy

## 2012-12-19 MED ORDER — NYSTATIN 100000 UNIT/GM EX POWD
Freq: Two times a day (BID) | CUTANEOUS | Status: DC
  Administered 2012-12-19 – 2012-12-21 (×4): via TOPICAL
  Filled 2012-12-19: qty 15

## 2012-12-19 MED ORDER — OXYCODONE HCL ER 10 MG PO T12A
10.0000 mg | EXTENDED_RELEASE_TABLET | Freq: Two times a day (BID) | ORAL | Status: DC
  Administered 2012-12-19 – 2012-12-21 (×5): 10 mg via ORAL
  Filled 2012-12-19 (×5): qty 1

## 2012-12-19 MED ORDER — GABAPENTIN 100 MG PO CAPS
100.0000 mg | ORAL_CAPSULE | Freq: Three times a day (TID) | ORAL | Status: DC
  Administered 2012-12-19 – 2012-12-21 (×7): 100 mg via ORAL
  Filled 2012-12-19 (×9): qty 1

## 2012-12-19 NOTE — Progress Notes (Signed)
Occupational Therapy Session Note  Patient Details  Name: Andrew [name] MRN: [id number] Date of Birth: 1955/10/17  Today's Date: 12/19/2012 Time: 0800-0900 Time Calculation (min): 60 min  Short Term Goals: No short term goals set  Skilled Therapeutic Interventions/Progress Updates:      Pt seen for BADL retraining of toileting, bathing, and dressing with a focus on functional mobility and dynamic standing balance. Pt is now mod I with completing his self care, but needs supervision with ADL transfers and ambulation with RW.  He will have to side step into his bathroom at home and discussed concerns with accessibility of his apartment.  Recommended pt not try to bathe until [facility] is there to assist him and use BSC versus trying to walk into bathroom himself.  Pt resting in w/c at end of session.  Therapy Documentation Precautions:  Precautions Precautions: Fall Required Braces or Orthoses:  (limb guard) Restrictions Weight Bearing Restrictions: No  Pain: Pain Assessment Pain Assessment: 0-10 Pain Score:   5 Pain Type: Surgical pain Pain Location: Leg Pain Orientation: Left Pain Descriptors: Aching Pain Frequency: Constant Pain Onset: On-going Pain Intervention(s): Repositioned;Ambulation/increased activity ADL:  See FIM for current functional status  Therapy/Group: Individual Therapy  SAGUIER,JULIA 12/19/2012, 9:36 AM

## 2012-12-19 NOTE — Plan of Care (Signed)
Problem: RH PAIN MANAGEMENT Goal: RH STG PAIN MANAGED AT OR BELOW PT'S PAIN GOAL </=7 Added oxycontin BID and neurontin

## 2012-12-19 NOTE — Progress Notes (Signed)
Patient ID: Andrew [name], male   DOB: 10-05-1955, 57 y.o.   MRN: [id number] Subjective/Complaints: 57 y.o. right-handed male with history of peripheral vascular disease and multiple revascularization procedures. Most recently amputation of left second toe secondary to osteomyelitis as well as thrombectomy of left femoral to peroneal bypass revision with angioplasty in early April 2014. Presented 12/06/2012 with gangrenous foot. No relief with conservative care. Underwent left below-knee amputation 12/10/2012 per Dr. Edilia Bo. A limb guard was placed per [employer]. Postoperative pain management. Acute blood loss anemia after surgery latest hemoglobin of 10 and monitored. Placed on subcutaneous Lovenox for DVT prophylaxis.  Pain an issue Objective: Vital Signs: Blood pressure 145/91, pulse 87, temperature 97.9 F (36.6 C), temperature source Oral, resp. rate 16, weight 65.5 kg (144 lb 6.4 oz), SpO2 100.00%. No results found. No results found for this or any previous visit (from the past 72 hour(s)).    Blood pressure 138/80, pulse 82, temperature 98.4 F (36.9 C), temperature source Oral, resp. rate 18, height 5\' 11"  (1.803 m), weight 66.2 kg (145 lb 15.1 oz), SpO2 100.00%.  Physical Exam  Vitals reviewed.  Constitutional: He is oriented to person, place, and time.  57 year old male appearing older than stated age  HENT:  Head: Normocephalic.  Eyes: EOM are normal.  Neck: Neck supple. Thyromegaly present.  Cardiovascular: Normal rate and regular rhythm.  Pulmonary/Chest: Effort normal and breath sounds normal. No respiratory distress.  Abdominal: Soft. Bowel sounds are normal. He exhibits no distension.  Neurological: He is alert and oriented to person, place, and time.  Patient was able name person place as well as date of birth but somewhat slow to process. He was appropriate during exam with flat affect. UE's grossly 4/5. Left HF antigravity. RLE is grossly 3to 4/5. Senses pain  and light touch in all 4.  Skin:  Left below knee amputation no drainage, no erythema Psychiatric: He has a normal mood and affect. His behavior is normal   Assessment/Plan: 1. Functional deficits secondary to Left BKA which require 3+ hours per day of interdisciplinary therapy in a comprehensive inpatient rehab setting. Physiatrist is providing close team supervision and 24 hour management of active medical problems listed below. Physiatrist and rehab team continue to assess barriers to discharge/monitor patient progress toward functional and medical goals. FIM: FIM - Bathing Bathing Steps Patient Completed: Chest;Right Arm;Left Arm;Abdomen;Front perineal area;Buttocks;Right upper leg;Left upper leg;Right lower leg (including foot) Bathing: 6: More than reasonable amount of time  FIM - Upper Body Dressing/Undressing Upper body dressing/undressing steps patient completed: Thread/unthread right sleeve of pullover shirt/dresss;Thread/unthread left sleeve of pullover shirt/dress;Put head through opening of pull over shirt/dress;Pull shirt over trunk Upper body dressing/undressing: 6: More than reasonable amount of time FIM - Lower Body Dressing/Undressing Lower body dressing/undressing steps patient completed: Thread/unthread right underwear leg;Pull underwear up/down;Thread/unthread left underwear leg;Thread/unthread right pants leg;Thread/unthread left pants leg;Pull pants up/down;Don/Doff right sock Lower body dressing/undressing: 6: More than reasonable amount of time  FIM - Toileting Toileting steps completed by patient: Adjust clothing prior to toileting;Performs perineal hygiene;Adjust clothing after toileting Toileting: 6: More than reasonable amount of time  FIM - Diplomatic Services operational officer Devices: Art gallery manager Transfers: 5-To toilet/BSC: Supervision (verbal cues/safety issues);5-From toilet/BSC: Supervision (verbal cues/safety issues)  FIM - Network engineer Assistive Devices: Arm rests Bed/Chair Transfer: 6: Supine > Sit: No assist;6: Sit > Supine: No assist;5: Bed > Chair or W/C: Supervision (verbal cues/safety issues);5: Chair or W/C > Bed: Supervision (verbal cues/safety  issues)  FIM - Locomotion: Wheelchair Distance: 150 Locomotion: Wheelchair: 6: Travels 150 ft or more, turns around, maneuvers to table, bed or toilet, negotiates 3% grade: maneuvers on rugs and over door sills independently FIM - Locomotion: Ambulation Locomotion: Ambulation Assistive Devices: Designer, industrial/product Ambulation/Gait Assistance: 5: Supervision Locomotion: Ambulation: 5: Travels 150 ft or more with supervision/safety issues  Comprehension Comprehension Mode: Auditory Comprehension: 7-Follows complex conversation/direction: With no assist  Expression Expression Mode: Verbal Expression: 6-Expresses complex ideas: With extra time/assistive device  Social Interaction Social Interaction: 6-Interacts appropriately with others with medication or extra time (anti-anxiety, antidepressant).  Problem Solving Problem Solving: 5-Solves complex 90% of the time/cues < 10% of the time  Memory Memory: 7-Complete Independence: No helper Medical Problem List and Plan:  1. 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 DVT Prophylaxis/Anticoagulation: Subcutaneous Lovenox. Monitor platelet counts any signs of bleeding  3. Pain Management: Percocet as needed. Monitor with increased mobility Add oxycontin 4. Mood: Zoloft 100 mg daily, Klonopin 1 mg daily as needed. Provide emotional support and positive reinforcement  5. Neuropsych: This patient is capable of making decisions on his/her own behalf.  6. Postoperative anemia. Latest hemoglobin of 10. Followup CBC  7. Hypertension. Vasotec 20 mg daily, hydrochlorothiazide 25 mg daily. Monitor with increased activity  8. Hyperlipidemia. Zocor  9. Tobacco abuse. Chantix 1 mg twice a  day. Provide counseling  10. GERD. Protonix   LOS (Days) 6 A FACE TO FACE EVALUATION WAS PERFORMED  KIRSTEINS,ANDREW E 12/19/2012, 10:48 AM

## 2012-12-19 NOTE — Progress Notes (Signed)
Physical Therapy Session Note  Patient Details  Name: Andrew [name] MRN: [id number] Date of Birth: 1955/11/29  Today's Date: 12/19/2012 Time: Session #1: [id number],  Session #2:  [id number] Time Calculation (min): Session #1: 25 min Session #2: 24 min  Skilled Therapeutic Interventions/Progress Updates:    .Session #1: WC mobility to gym supervision 150', standing leg exercises in parallel bars supervision: hip abduction, ext, flexion 2 x 20 reps each, gait with RW from gym to room > 150' with supervision indoor tiled surfaces, cues for upright posture.   Session #2: This session focused on longer distance indoor and outdoor [facility] mobility supervision for safety, cues for technique outside going up and down inclines >300' combined distance.  Gait with RW outdoor surfaces supervision for safety, cues for foot clearance >100'.    Therapy Documentation Precautions:  Precautions Precautions: Fall Required Braces or Orthoses:  (limb guard) Restrictions Weight Bearing Restrictions: No   Vital Signs:   Pain: Pain Assessment Pain Assessment: 0-10 Pain Score:   8 Pain Type: Neuropathic pain;Phantom pain Pain Location: Leg Pain Orientation: Left Pain Descriptors: Burning;Pins and needles Pain Onset: On-going Patients Stated Pain Goal: 5 Pain Intervention(s): Medication (See eMAR)  See FIM for current functional status  Therapy/Group: Individual Therapy  Lurena Joiner B. Velinda Wrobel, PT, DPT [phone]   12/19/2012, 12:10 PM

## 2012-12-19 NOTE — Progress Notes (Signed)
Physical Therapy Session Note  Patient Details  Name: MERCURY ROCK MRN: [id number] Date of Birth: [birth year]  Today's Date: 12/19/2012 Time: 1005-1035 and 1305-1350 Time Calculation (min): 30 min and 45 min  Short Term Goals: Week 1: = LTGs    Skilled Therapeutic Interventions/Progress Updates:  Session 1:  W/c propulsion to/from gym modified independent for activity tolerance, to therapy.  High level gait with RW and SW up/down 3 curbs and ramp, supervision, 1 cue for technique.  Basic transfers with supervision, 1 cue for w/c set-up.  Pt consistently attempts to transfer to L amputated side without walker; he has been advised to transfer to R when possible.  Session 2:  Pt continues to be quite anxious about d/c to home, now planned for 12/21/12; reassurance and emotional support provided by therapist.    Gait with RW x 150' with supervision, cues to keep LLE within RW instead of ahead of it, for safety, and 1 cue for hand placement sit>< stand.  Gait on carpet in home situation x 50' with RW, transporting clothing on RW.  Standing balance to hang up items on clothes rod, with supervision.  Bed mobility modified independent; basic transfers supervision, VCs for best technique.  W/c parts management with supervision.      HEP instruction for RLE hamstring stretching, in prone, with 5# weight on end of residual limb, tolerated well.  Instructed pt to do this QD or BID in bed, requesting help to place weight; weight left in room.  Encouraged pt to read hand out for HEP and start taking responsibility for it.  Pt's anxiety impacts his ability to attend to instruction, retain and demonstrate safe w/c parts mgt, use of Rw, and HEP.    Therapy Documentation Precautions:  Precautions Precautions: Fall Required Braces or Orthoses:  (limb guard) Restrictions Weight Bearing Restrictions: No   Pain: Pain Assessment Pain Assessment: 0-10 Pain Score:   8 Pain Type: Neuropathic  pain;Phantom pain Pain Location: Leg Pain Orientation: Left Pain Descriptors: Burning;Pins and needles Pain Onset: On-going Patients Stated Pain Goal: 5 Pain Intervention(s): Medication (See eMAR)   Locomotion : Ambulation Ambulation/Gait Assistance: 5: Supervision       See FIM for current functional status  Therapy/Group: Individual Therapy  Hasson Gaspard 12/19/2012, 11:28 AM

## 2012-12-20 ENCOUNTER — Inpatient Hospital Stay (HOSPITAL_COMMUNITY): Payer: Medicare Other | Admitting: Occupational Therapy

## 2012-12-20 ENCOUNTER — Inpatient Hospital Stay (HOSPITAL_COMMUNITY): Payer: Medicare Other

## 2012-12-20 DIAGNOSIS — S88119A Complete traumatic amputation at level between knee and ankle, unspecified lower leg, initial encounter: Secondary | ICD-10-CM

## 2012-12-20 DIAGNOSIS — I739 Peripheral vascular disease, unspecified: Secondary | ICD-10-CM

## 2012-12-20 DIAGNOSIS — L98499 Non-pressure chronic ulcer of skin of other sites with unspecified severity: Secondary | ICD-10-CM

## 2012-12-20 LAB — CREATININE, SERUM: GFR calc non Af Amer: >90 mL/min (ref >90)

## 2012-12-20 NOTE — Progress Notes (Signed)
Occupational Therapy Session Note  Patient Details  Name: Andrew [name] MRN: [id number] Date of Birth: 10-19-1955  Today's Date: 12/20/2012 Time: 4098-1191 Time Calculation (min): 60 min  Short Term Goals: Week 1:  OT Short Term Goal 1 (Week 1): STGs = LTGs based on short LOS  Skilled Therapeutic Interventions/Progress Updates:   Engaged in simple kitchen cooking task at a w/c level to include stand PRN, tub bench transfer (dry run) using a rolling walker and unwrap there re-wrap his residual limb.  Patient is Mod I with all tasks except required some vcs for ace wrapping techniques.  Therapy Documentation Precautions:  Precautions Precautions: Fall Required Braces or Orthoses:  (limb guard) Restrictions Weight Bearing Restrictions: No Pain: 6/10 LLE, RN aware and meds provided Therapy/Group: Individual Therapy  Andrew [name] 12/20/2012, 4:31 PM

## 2012-12-20 NOTE — Progress Notes (Signed)
Patient ID: Andrew [name], male   DOB: December 02, 1955, 57 y.o.   MRN: [id number] Subjective/Complaints: 57 y.o. right-handed male with history of peripheral vascular disease and multiple revascularization procedures. Most recently amputation of left second toe secondary to osteomyelitis as well as thrombectomy of left femoral to peroneal bypass revision with angioplasty in early April 2014. Presented 12/06/2012 with gangrenous foot. No relief with conservative care. Underwent left below-knee amputation 12/10/2012 per Dr. Edilia Bo. A limb guard was placed per [employer]. Postoperative pain management. Acute blood loss anemia after surgery latest hemoglobin of 10 and monitored. Placed on subcutaneous Lovenox for DVT prophylaxis.  Pain perhaps better although pt does not give  a clear history    Objective: Vital Signs: Blood pressure 125/76, pulse 104, temperature 98 F (36.7 C), temperature source Oral, resp. rate 16, weight 65.5 kg (144 lb 6.4 oz), SpO2 99.00%. No results found. Results for orders placed during the hospital encounter of 12/13/12 (from the past 72 hour(s))  CREATININE, SERUM     Status: None   Collection Time    12/20/12  5:40 AM      Result Value Range   Creatinine, Ser 0.69  0.50 - 1.35 mg/dL   GFR calc non Af Amer >90  >90 mL/min   GFR calc Af Amer >90  >90 mL/min   Comment:            The eGFR has been calculated     using the CKD EPI equation.     This calculation has not been     validated in all clinical     situations.     eGFR's persistently     <90 mL/min signify     possible Chronic Kidney Disease.      Blood pressure 138/80, pulse 82, temperature 98.4 F (36.9 C), temperature source Oral, resp. rate 18, height 5\' 11"  (1.803 m), weight 66.2 kg (145 lb 15.1 oz), SpO2 100.00%.  Physical Exam  Vitals reviewed.  Constitutional: He is oriented to person, place, and time.  57 year old male appearing older than stated age  HENT:  Head: Normocephalic.   Eyes: EOM are normal.  Neck: Neck supple. Thyromegaly present.  Cardiovascular: Normal rate and regular rhythm.  Pulmonary/Chest: Effort normal and breath sounds normal. No respiratory distress.  Abdominal: Soft. Bowel sounds are normal. He exhibits no distension.  Neurological: He is alert and oriented to person, place, and time.  Patient was able name person place as well as date of birth but somewhat slow to process. He was appropriate during exam with flat affect. UE's grossly 4/5. Left HF antigravity. RLE is grossly 3to 4/5. Senses pain and light touch in all 4.  Skin:  Left below knee amputation no drainage, no erythema Psychiatric: He has a normal mood and affect. His behavior is normal   Assessment/Plan: 1. Functional deficits secondary to Left BKA which require 3+ hours per day of interdisciplinary therapy in a comprehensive inpatient rehab setting. Physiatrist is providing close team supervision and 24 hour management of active medical problems listed below. Physiatrist and rehab team continue to assess barriers to discharge/monitor patient progress toward functional and medical goals. FIM: FIM - Bathing Bathing Steps Patient Completed: Chest;Right Arm;Left Arm;Abdomen;Front perineal area;Buttocks;Right upper leg;Left upper leg;Right lower leg (including foot) Bathing: 6: More than reasonable amount of time  FIM - Upper Body Dressing/Undressing Upper body dressing/undressing steps patient completed: Thread/unthread right sleeve of pullover shirt/dresss;Thread/unthread left sleeve of pullover shirt/dress;Put head through opening of pull over shirt/dress;Pull  shirt over trunk Upper body dressing/undressing: 6: More than reasonable amount of time FIM - Lower Body Dressing/Undressing Lower body dressing/undressing steps patient completed: Thread/unthread right underwear leg;Pull underwear up/down;Thread/unthread left underwear leg;Thread/unthread right pants leg;Thread/unthread left  pants leg;Pull pants up/down;Don/Doff right sock Lower body dressing/undressing: 6: More than reasonable amount of time  FIM - Toileting Toileting steps completed by patient: Adjust clothing prior to toileting;Performs perineal hygiene;Adjust clothing after toileting Toileting: 6: More than reasonable amount of time  FIM - Diplomatic Services operational officer Devices: Art gallery manager Transfers: 6-More than reasonable amt of time  FIM - Banker Devices: Therapist, occupational: 6: Assistive device: no helper  FIM - Locomotion: Wheelchair Distance: 200 Locomotion: Wheelchair: 6: Travels 150 ft or more, turns around, maneuvers to table, bed or toilet, negotiates 3% grade: maneuvers on rugs and over door sills independently FIM - Locomotion: Ambulation Locomotion: Ambulation Assistive Devices: Designer, industrial/product Ambulation/Gait Assistance: 6: Modified independent (Device/Increase time) Locomotion: Ambulation: 6: Travels 150 ft or more with assistive device/no helper  Comprehension Comprehension Mode: Auditory Comprehension: 6-Follows complex conversation/direction: With extra time/assistive device  Expression Expression Mode: Verbal Expression: 6-Expresses complex ideas: With extra time/assistive device  Social Interaction Social Interaction: 6-Interacts appropriately with others with medication or extra time (anti-anxiety, antidepressant).  Problem Solving Problem Solving: 5-Solves basic 90% of the time/requires cueing < 10% of the time  Memory Memory: 7-Complete Independence: No helper Medical Problem List and Plan:  1. 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  Healing very well, pt very anxious about wearing limb guard, , it makes his knee feel better but it keeps slipping, have asked [employer] to re eval and modify, looks like thigh area needs padding 2. DVT Prophylaxis/Anticoagulation: Subcutaneous Lovenox.  Monitor platelet counts any signs of bleeding  3. Pain Management: Percocet as needed. Monitor with increased mobility Add oxycontin 4. Mood: Zoloft 100 mg daily, Klonopin 1 mg daily as needed. Provide emotional support and positive reinforcement  5. Neuropsych: This patient is capable of making decisions on his/her own behalf.  6. Postoperative anemia. Latest hemoglobin of 10. Followup CBC  7. Hypertension. Vasotec 20 mg daily, hydrochlorothiazide 25 mg daily. Monitor with increased activity  8. Hyperlipidemia. Zocor  9. Tobacco abuse. Chantix 1 mg twice a day. Provide counseling  10. GERD. Protonix   LOS (Days) 7 A FACE TO FACE EVALUATION WAS PERFORMED  Erick Colace 12/20/2012, 6:49 PM

## 2012-12-20 NOTE — Progress Notes (Signed)
Occupational Therapy Discharge Summary  Patient Details  Name: DUVID SMALLS MRN: [id number] Date of Birth: 1956/04/22  Today's Date: 12/22/12  Patient has met 7 of 7 long term goals due to improved activity tolerance, improved balance and ability to compensate for deficits.  Patient to discharge at overall Modified Independent level.  Patient's care partner is independent to provide the necessary physical assistance at discharge.    Reasons goals not met: n/a  Recommendation:  Patient will benefit from ongoing skilled OT services in home health setting to continue to advance functional skills in the area of BADL and iADL.  Equipment: No equipment provided. Pt owns a tub bench and BSC.  Reasons for discharge: treatment goals met  Patient/family agrees with progress made and goals achieved: Yes  OT Discharge  ADL  mod I with BADL Vision/Perception  Vision - History Baseline Vision: Wears glasses all the time Patient Visual Report: No change from baseline Vision - Assessment Eye Alignment: Within Functional Limits Perception Perception: Within Functional Limits Praxis Praxis: Intact  Cognition Overall Cognitive Status: Within Functional Limits for tasks assessed Sensation Sensation Light Touch: Impaired by gross assessment Stereognosis: Appears Intact Hot/Cold: Appears Intact Proprioception: Appears Intact Coordination Gross Motor Movements are Fluid and Coordinated: Yes Fine Motor Movements are Fluid and Coordinated: Yes Motor  Motor Motor: Within Functional Limits Mobility  Bed Mobility Supine to Sit: 7: Independent Sitting - Scoot to Edge of Bed: 7: Independent Transfers Sit to Stand: 6: Modified independent (Device/Increase time) Stand to Sit: 6: Modified independent (Device/Increase time)  Trunk/Postural Assessment  Cervical Assessment Cervical Assessment: Within Functional Limits Thoracic Assessment Thoracic Assessment: Exceptions to [facility] (kyphotic  posture) Lumbar Assessment Lumbar Assessment: Within Functional Limits Postural Control Postural Control: Within Functional Limits  Balance Static Sitting Balance Static Sitting - Level of Assistance: 7: Independent Static Standing Balance Static Standing - Level of Assistance: 6: Modified independent (Device/Increase time) Dynamic Standing Balance Dynamic Standing - Level of Assistance: 6: Modified independent (Device/Increase time) (with one UE supported on RW or firm surface) Extremity/Trunk Assessment RUE Assessment RUE Assessment: Within Functional Limits LUE Assessment LUE Assessment: Within Functional Limits  See FIM for current functional status  SAGUIER,JULIA 12/20/2012, 8:55 AM

## 2012-12-20 NOTE — Progress Notes (Signed)
Social Work Patient ID: Andrew [name], male   DOB: [birth year], 57 y.o.   MRN: [id number]  Met with patient this morngin to review team conference.  Pt agreeable with targeted d/c date of tomorrow.  Have also spoken with his sister and brother who are aware and agreeable.  Have planned to complete family ed tomorrow with brother, Andrew [name], prior to d/c.  [name], LCSW

## 2012-12-20 NOTE — Progress Notes (Signed)
Occupational Therapy Session Note  Patient Details  Name: Andrew [name] MRN: [id number] Date of Birth: Aug 13, 1955  Today's Date: 12/20/2012 Time: 0805-0915 Time Calculation (min): 70 min  Short Term Goals: No short term goals set  Skilled Therapeutic Interventions/Progress Updates:      Pt seen for BADL retraining of toileting, bathing/ showering, and dressing with a focus on functional mobility into and out of bathroom with RW, standing balance, and activity tolerance.  Pt was able to ambulate from bed to tub bench and back to w/c with RW with mod I. He then completed his self care with mod I.  Much of the session was discussing limb wrapping vs use of limb guard. At this time, limb guard does not fit well and pt will benefit from ace wrapping.  Pt's siblings are setting up his apartment to increase accessiblity.    Therapy Documentation Precautions:  Precautions Precautions: Fall Required Braces or Orthoses:  (limb guard) Restrictions Weight Bearing Restrictions: No  Pain: Pain Assessment Pain Assessment: 0-10 Pain Score:   4 Pain Type: Neuropathic pain Pain Location: Leg Pain Orientation: Left Patients Stated Pain Goal: 2 Pain Intervention(s): Medication (See eMAR) ADL:  See FIM for current functional status  Therapy/Group: Individual Therapy  SAGUIER,JULIA 12/20/2012, 9:35 AM

## 2012-12-20 NOTE — Progress Notes (Signed)
Physical Therapy Note  Patient Details  Name: Andrew [name] MRN: [id number] Date of Birth: Jun 04, 1956 Today's Date: 12/20/2012  9:50 - 10:50 60 minutes Individual session Patient reports pain in leg at 5. Nursing gave medication during session.  Treatment focused on grad day activities. Patient propelled wheelchair 200 feet with modified independence. Patient sit to stand and ambulated with rolling walker 150 feet on tile and carpet in home like setting with modified independence. Patient performed transfers from furniture and simulated car with modified independence. Patient performed bed mobility independently. Therapist reviewed home exercise program with patient who required minimal cueing to complete home exercise program. Patient very concerned about using ace wrap versus limb guard. Therapist had long discussion about need to maintain knee hip and knee ROM (flexion and extension) as well as shaping residual limb for prosthesis. Limb guard modified with padding in thigh area to provide better fit for patient. Patient performed ambulation up and down 3 steps with bilateral rails and supervision. Patient ambulated up and down 4 steps with standard walker forwards with supervision. Discussed importance of activity following discharge and safety in the home. Patient made modified independent in room today in preparation for discharge tomorrow. Family education needs to be completed with younger brother.  [name] M 12/20/2012, 2:55 PM

## 2012-12-20 NOTE — Patient Care Conference (Signed)
Inpatient RehabilitationTeam Conference and Plan of Care Update Date: 12/20/2012   Time: 8:13 AM    Patient Name: Andrew [name]      Medical Record Number: [id number]  Date of Birth: [birth year] Sex: Male         Room/Bed: 4153/4153-01 Payor Info: Payor: MEDICARE / Plan: MEDICARE PART A AND B / Product Type: *No Product type* /    Admitting Diagnosis: LT BKA  Admit Date/Time:  12/13/2012  4:35 PM Admission Comments: No comment available   Primary Diagnosis:  S/P BKA (below knee amputation) Principal Problem: S/P BKA (below knee amputation)  Patient Active Problem List   Diagnosis Date Noted  . S/P BKA (below knee amputation) 12/14/2012  . Abnormality of gait 11/10/2012  . Acute gingivitis 09/03/2012  . Atherosclerosis of native arteries of the extremities with intermittent claudication 06/08/2011  . Preventative health care 09/15/2010  . [facility] COMPLICATION DUE CORONARY BYPASS GRAFT 06/10/2010  . CHEST PAIN 03/04/2010  . 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 OTHER MALIGNANT NEOPLASM OF SKIN OF LIP 01/23/2009  . OTHER DENTAL CARIES 03/14/2008  . DYSLIPIDEMIA 05/10/2006  . ANXIETY 05/10/2006  . TOBACCO ABUSE 05/10/2006  . HYPERTENSION, ESSENTIAL NOS 05/10/2006  . PERIPHERAL VASCULAR DISEASE 05/10/2006  . BACK PAIN 05/10/2006  . 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 GLUCOSE INTOLERANCE, HX OF 05/10/2006    Expected Discharge Date: Expected Discharge Date: 12/21/12  Team Members Present: Physician leading conference: Dr. Claudette [name] Social Worker Present: Andrew Jupiter, LCSW Nurse Present: Andrew Bode, RN PT Present: Andrew [name], PT;Other (comment);Andrew [name], PT ([address] [place], PT) OT Present: Andrew [name], Andrew [name], OT;Other (comment) Andrew [name], OT) Other (Discipline and Name): Andrew [name], PPS Coordinator     Current Status/Progress Goal Weekly Team Focus  Medical   pain management, poor fitting of limb guard  ask orthotist to re eval limb guard   med management   Bowel/Bladder   Continent of bowel and bladder  Remain continent of bowel and bladder  Continent of bowel and bladder   Swallow/Nutrition/ Hydration             ADL's   mod I with bathing, dressing, toileting, supervision with transfers and ambulation - home accessability a concern  Mod I   ADL retraining, functional mobility training, pt education   Mobility   supervision ambulation and min assist on steps  Mod I ambulation; supervision on steps  activity tolerance, ambulation, standing endurance, steps, patient/family education   Communication             Safety/Cognition/ Behavioral Observations            Pain   c/o pain to L BKA level 8 with OXY IR 10mg  given q4hours PRN  no complaints of pain  Pain level less than 4   Skin   Incision to L BKA site with staples and vaseline dressing in place, no drainage, dried blood  No new skin breakdown  No new skin breakdown    Rehab Goals Patient on target to meet rehab goals: Yes *See Care Plan and progress notes for long and short-term goals.  Barriers to Discharge: Med adjustment,manage limb    Possible Resolutions to Barriers:  SNF vs home mod I    Discharge Planning/Teaching Needs:  Home with family who can only provide intermittent assistance      Team Discussion:  Wound healing well;  Limb shaping still an issues - consult [employer].  Already at supervision level, however, pt  talking about desire to stay another 3 weeks.  Anticipate reaching mod i goals except supervision with stairs  Revisions to Treatment Plan:  None   Continued Need for Acute Rehabilitation Level of Care: The patient requires daily medical management by a physician with specialized training in physical medicine and rehabilitation for the following conditions: Daily direction of a multidisciplinary physical rehabilitation program to ensure safe treatment while eliciting the highest outcome that is of practical value to the patient.:  Yes Daily medical management of patient stability for increased activity during participation in an intensive rehabilitation regime.: Yes Daily analysis of laboratory values and/or radiology reports with any subsequent need for medication adjustment of medical intervention for : Other;Post surgical problems  Andrew [name] 12/20/2012, 8:13 AM

## 2012-12-20 NOTE — Discharge Summary (Signed)
  Discharge summary job [phone]

## 2012-12-21 ENCOUNTER — Inpatient Hospital Stay (HOSPITAL_COMMUNITY): Payer: Medicare Other

## 2012-12-21 ENCOUNTER — Inpatient Hospital Stay (HOSPITAL_COMMUNITY): Payer: Medicare Other | Admitting: Occupational Therapy

## 2012-12-21 DIAGNOSIS — L98499 Non-pressure chronic ulcer of skin of other sites with unspecified severity: Secondary | ICD-10-CM

## 2012-12-21 DIAGNOSIS — S88119A Complete traumatic amputation at level between knee and ankle, unspecified lower leg, initial encounter: Secondary | ICD-10-CM

## 2012-12-21 DIAGNOSIS — I739 Peripheral vascular disease, unspecified: Secondary | ICD-10-CM

## 2012-12-21 MED ORDER — HYDROCHLOROTHIAZIDE 25 MG PO TABS: 25.0000 mg | ORAL_TABLET | Freq: Every day | ORAL | Status: DC

## 2012-12-21 MED ORDER — SIMVASTATIN 40 MG PO TABS: 40.0000 mg | ORAL_TABLET | Freq: Every day | ORAL | Status: DC

## 2012-12-21 MED ORDER — OXYCODONE HCL 10 MG PO TABS: 10.0000 mg | ORAL_TABLET | ORAL | Status: DC | PRN

## 2012-12-21 MED ORDER — ASPIRIN EC 81 MG PO TBEC: 81.0000 mg | DELAYED_RELEASE_TABLET | Freq: Every day | ORAL | Status: DC

## 2012-12-21 MED ORDER — OMEPRAZOLE 20 MG PO CPDR: 20.0000 mg | DELAYED_RELEASE_CAPSULE | Freq: Every day | ORAL | Status: DC

## 2012-12-21 MED ORDER — METHOCARBAMOL 500 MG PO TABS: 500.0000 mg | ORAL_TABLET | Freq: Three times a day (TID) | ORAL | Status: DC | PRN

## 2012-12-21 MED ORDER — ENALAPRIL MALEATE 20 MG PO TABS: 20.0000 mg | ORAL_TABLET | Freq: Every day | ORAL | Status: DC

## 2012-12-21 MED ORDER — VARENICLINE TARTRATE 1 MG PO TABS: 1.0000 mg | ORAL_TABLET | Freq: Two times a day (BID) | ORAL | Status: DC

## 2012-12-21 MED ORDER — OXYCODONE HCL ER 10 MG PO T12A: 10.0000 mg | EXTENDED_RELEASE_TABLET | Freq: Two times a day (BID) | ORAL | Status: DC

## 2012-12-21 MED ORDER — GABAPENTIN 100 MG PO CAPS: 100.0000 mg | ORAL_CAPSULE | Freq: Three times a day (TID) | ORAL | Status: DC

## 2012-12-21 MED ORDER — SERTRALINE HCL 100 MG PO TABS: 100.0000 mg | ORAL_TABLET | Freq: Every day | ORAL | Status: DC

## 2012-12-21 NOTE — Progress Notes (Signed)
Occupational Therapy Session Note  Patient Details  Name: Andrew [name] MRN: [id number] Date of Birth: Dec 16, 1955  Today's Date: 12/21/2012 Time: 1305-1330 Time Calculation (min): 25 min  Short Term Goals: Week 1:  OT Short Term Goal 1 (Week 1): STGs = LTGs based on short LOS  Skilled Therapeutic Interventions/Progress Updates:  Focused session on patient/caregiver education with patient's brother Thayer Ohm.  Discussed home environment and w/c and walker accessibility to include possible recommendation to use urinal, BSC, and sponge bath until HHOT reviews bathroom setup for safe mobility in "small bathroom".  Patient demonstrated side hopping with RW as a method to move through tight spaces with 1 LOB with turn yet able to self-correct.  Patient's brother has already completed some re-arranging in patient's room and encouraged them both to problem solve together and practice all mobility sceneries before patient performs them by himself.  Patient encouraged to use his w/c most of the time when feeling weak, nervous or feeling the affects of medication.  Patient agreed and stated, "I'm not going to have any trouble once I get home".   Therapy Documentation Precautions:  Precautions Precautions: Fall Required Braces or Orthoses:  (limb guard) Restrictions Weight Bearing Restrictions: No Pain: LLE pain, not rated and states he has been premedicated  Therapy/Group: Individual Therapy  Davidlee Jeanbaptiste 12/21/2012, 1:52 PM

## 2012-12-21 NOTE — Progress Notes (Signed)
Social Work  Discharge Note  The overall goal for the admission was met for:   Discharge location: Yes - home with brother  Length of Stay: Yes - 8 days  Discharge activity level: Yes  Home/community participation: Yes  Services provided included: MD, RD, PT, OT, RN, TR, Pharmacy, Neuropsych and SW  Financial Services: Medicare and Medicaid  Follow-up services arranged: Home Health: RN, PT, OT via [facility], DME: [phone] [place] with L amp support pad, cushion via [facility] and Patient/Family has no preference for HH/DME agencies  Comments (or additional information):  Patient/Family verbalized understanding of follow-up arrangements: Yes  Individual responsible for coordination of the follow-up plan: patient  Confirmed correct DME delivered: [name] 12/21/2012    [name]

## 2012-12-21 NOTE — Progress Notes (Signed)
Physical Therapy Discharge Summary  Patient Details  Name: Andrew [name] MRN: [id number] Date of Birth: 12-01-1955  Today's Date: 12/21/2012 Time: 1345-1400 Time Calculation (min): 15 min  Patient has met 8 of 8 long term goals due to improved activity tolerance, improved balance, increased strength, decreased pain and ability to compensate for deficits.  Patient to discharge at an ambulatory level Modified Independent. Patient requires supervision on steps. Patient's brother is independent to provide the necessary physical assistance with stairs at discharge.  Treatment session focused on family education with brother. Patient modified independent with all mobility except steps. Patient demonstrated up and down 4 steps with standard walker and supervision. Patient's brother, Thayer Ohm, stated that he thinks step into house is greater than 4 inches - more like 8 or 10 inches. Patient demonstrated hopping up backwards with min steady assist from brother, Thayer Ohm. Discussed problem solving stairs with patient and brother and instruction completed in ambulating up and down 4 steps with standard walker going forwards and backwards depending on height of steps at patient's home.  Reasons goals not met: N/a all goals met  Recommendation:  Patient will benefit from ongoing skilled PT services in home health setting to continue to advance safe functional mobility, address ongoing impairments in LE strengthening/ROM, dependence on stairs, and minimize fall risk.  Equipment: [phone] wheelchair with left amputee pad, basic wheelchair cushion. Patient reports he has rolling walker and standard walker at home.  Reasons for discharge: treatment goals met and discharge from hospital  Patient/family agrees with progress made and goals achieved: Yes  PT Discharge Precautions/Restrictions Precautions Precautions: Fall Required Braces or Orthoses: Other Brace/Splint Other Brace/Splint: ace wrap or limb  guard Restrictions Weight Bearing Restrictions: No Pain Pain Assessment Pain Assessment: 0-10 Pain Score:   5 Pain Type: Phantom pain Pain Location: Leg Pain Orientation: Left Pain Descriptors: Burning;Aching Pain Frequency: Intermittent Pain Onset: Gradual Patients Stated Pain Goal: 3 Pain Intervention(s): Medication (See eMAR) Vision/Perception  Vision - History Baseline Vision: Wears glasses all the time Patient Visual Report: No change from baseline Vision - Assessment Eye Alignment: Within Functional Limits  Cognition Overall Cognitive Status: Within Functional Limits for tasks assessed Arousal/Alertness: Awake/alert Orientation Level: Oriented X4 Sensation Sensation Light Touch: Appears Intact Stereognosis: Appears Intact Hot/Cold: Appears Intact Proprioception: Appears Intact Motor  Motor Motor: Within Functional Limits  Mobility Bed Mobility Supine to Sit: 7: Independent Sitting - Scoot to Edge of Bed: 7: Independent Transfers Sit to Stand: 6: Modified independent (Device/Increase time) Stand to Sit: 6: Modified independent (Device/Increase time) Stand Pivot Transfers: 6: Modified independent (Device/Increase time) Locomotion  Ambulation Ambulation/Gait Assistance: 6: Modified independent (Device/Increase time) Ambulation Distance (Feet): 200 Feet Assistive device: Rolling walker High Level Ambulation High Level Ambulation: Side stepping Side Stepping: Patient can side step wtih rolling walker to get through narrow bathroom door with modified independence Stairs / Additional Locomotion Stairs: Yes Stairs Assistance: 5: Supervision Stairs Assistance Details (indicate cue type and reason): Patient can ambulate up and down 6 steps with bilateral rails and supervision. patient up and down 4 steps with walker forwards or backwards with supervision to access home. Stair Management Technique: With walker;Forwards Number of Stairs: 4 Height of Stairs:  4 Wheelchair Mobility Wheelchair Mobility: Yes Wheelchair Assistance: 6: Modified independent (Device/Increase time) Occupational hygienist: Both upper extremities Wheelchair Parts Management: Independent Distance: 300+  Trunk/Postural Assessment  Cervical Assessment Cervical Assessment: Within Functional Limits Thoracic Assessment Thoracic Assessment: Within Functional Limits Lumbar Assessment Lumbar Assessment: Within Functional Limits Postural Control Postural  Control: Within Functional Limits  Balance Static Sitting Balance Static Sitting - Balance Support: Feet supported;No upper extremity supported Static Sitting - Level of Assistance: 7: Independent Static Standing Balance Static Standing - Level of Assistance: 6: Modified independent (Device/Increase time) Dynamic Standing Balance Dynamic Standing - Level of Assistance: 6: Modified independent (Device/Increase time)  See FIM for current functional status  [name] M 12/21/2012, 2:19 PM

## 2012-12-21 NOTE — Progress Notes (Signed)
Discharge to home with patient's brother at 1430.  Discharge instructions provided by the PA, Dan Anguilli.  Patient verbalize understanding no further questions ask.  Patient escorted off unit by rehab NT.

## 2012-12-21 NOTE — Discharge Summary (Signed)
NAMEGABRIAN, Andrew [name]               ACCOUNT NO.:  [id number]  MEDICAL RECORD NO.:  [id number]  LOCATION:  4153                         FACILITY:  [facility]  PHYSICIAN:  Mariam Dollar, P.A.  DATE OF BIRTH:  10-25-1955  DATE OF ADMISSION:  12/13/2012 DATE OF DISCHARGE:                              DISCHARGE SUMMARY   DISCHARGE DIAGNOSES: 1. Left below-knee amputation secondary to peripheral vascular     disease, Dec 10, 2012. 2. Subcutaneous Lovenox for deep vein thrombosis prophylaxis. 3. Pain management. 4. Depression. 5. Postoperative anemia. 6. Hypertension. 7. Hyperlipidemia. 8. History of tobacco abuse. 9. Gastroesophageal reflux disease.  HISTORY:  [name] with history of peripheral vascular disease and multiple revascularization procedures  most recently, left second toe osteomyelitis with thrombectomy, left femoral to peroneal bypass with angioplasty April, 2014.  Presented Dec 06, 2012 with gangrenous foot and no relief with conservative care.  Underwent left below-knee amputation Dec 10, 2012, per Dr. Durwin Nora.  A limb guard was placed per [employer] prost[employer].  Postoperative pain management.  Acute blood loss anemia of 10 and monitored.  Subcutaneous Lovenox added for DVT prophylaxis.  Maintained initially on Keflex for wound coverage and since discontinued.  Physical and occupational therapy on going.  The patient was admitted for comprehensive rehab program.  PAST MEDICAL HISTORY:  See discharge diagnoses.  SOCIAL HISTORY:  Lives alone.  Has family in the area that checks on him.  FUNCTIONAL HISTORY:  Prior to admission, was driving.  He is on disability.  He was able to navigate stairs.  Functional status upon admission to rehab services was minimal assist to ambulate 16 feet with a rolling walker.  PHYSICAL EXAMINATION:  VITAL SIGNS:  Blood pressure 138/80, pulse 82, temperature 98.4, respirations 18. GENERAL:  This was an alert male,  oriented x3. HEENT:  Pupils round and reactive to light. LUNGS:  Clear to auscultation. CARDIAC:  Regular rate, rhythm. ABDOMEN:  Soft, nontender.  Good bowel sounds. EXTREMITIES:  Left below-knee amputation site dressed with limb guard in place.  REHABILITATION HOSPITAL COURSE:  The patient was admitted to inpatient rehab services with therapies initiated on a 3-hour daily basis consisting of physical therapy, occupational therapy, and rehabilitation nursing.  The following issues were addressed during the patient's rehabilitation stay.  Pertaining to [name] left below-knee amputation Dec 10, 2012, surgical site healing with followup per vascular surgery Dr. Durwin Nora.  The patient would follow up with [facility].  The patient remained on subcutaneous Lovenox for DVT prophylaxis.  Pain management ongoing with the use of Neurontin 100 mg 3 times daily as well as Robaxin for muscle spasms, and oxycodone as needed.  He did have a history of depression. He remained on Zoloft.  Blood pressures well controlled on Vasotec, hydrochlorothiazide.  No orthostatic changes.  He did have a history of tobacco abuse, maintained on Chantix twice daily with counseling in regard to cessation of nicotine products.  The patient received weekly collaborative interdisciplinary team conferences to discuss estimated length of stay, family teaching, and any barriers to discharge.  He continued to progress nicely.  OxyContin sustained release was added to his regimen  as his mobility progressed to aid in his overall pain control.  He was continent of bowel and bladder.  Modified independent with bathing, dressing, toileting, supervision with transfers and ambulation, minimal assist for stairs.  His plan was to be discharged to home with family.  Ongoing therapies would be dictated as per rehab services.  The patient had been placed back on Keflex for wound coverage and  monitored.  He remained afebrile.  DISCHARGE MEDICATIONS: 1. Aspirin 81 mg p.o. daily. 2. Keflex 500 mg p.o. 3 times daily x10 day course total. 3. Klonopin 1 mg p.o. at bedtime as needed. 4. Vasotec 20 mg p.o. daily. 5. Neurontin 100 mg p.o. t.i.d. 6. Hydrochlorothiazide 25 mg p.o. daily. 7. Robaxin 500 mg p.o. every 8 hours as needed for muscle spasms. 8. Multivitamin 1 tab daily. 9. Oxycodone immediate release 10 mg every 4 hours as needed for pain,     dispense of 90 tablets. 10.OxyContin sustained release 10 mg every 12 hours x2 weeks and stop. 11.Protonix 40 mg p.o. daily. 12.Zoloft 100 mg p.o. daily. 13.Zocor 40 mg p.o. at bedtime. 14.Chantix 1 mg p.o. b.i.d.  DIET:  Regular.  SPECIAL INSTRUCTIONS:  The patient would follow up with Dr. Jaynie Collins, vascular surgery 2 weeks, call for appointment.  Dr. Claudette Laws, at the outpatient rehab center, January 11, 2013.  Dr. Janalyn Harder, medical management.  Ongoing therapies had been arranged as per rehab services.     Mariam Dollar, P.A.     DA/MEDQ  D:  12/20/2012  T:  12/21/2012  Job:  [id number]  cc:   Sarajane Jews, MD

## 2012-12-24 ENCOUNTER — Telehealth: Payer: Self-pay

## 2012-12-24 NOTE — Telephone Encounter (Signed)
Patient would like restoril refilled to [facility] pharmacy.  Please advise.

## 2012-12-25 NOTE — Telephone Encounter (Signed)
Left message for patient, advised patient will not refill.  He can contact pcp.

## 2012-12-25 NOTE — Telephone Encounter (Signed)
This is not for long term use.  Will not refill

## 2013-01-07 ENCOUNTER — Telehealth: Payer: Self-pay | Admitting: *Deleted

## 2013-01-07 NOTE — Telephone Encounter (Signed)
Requests change order for visits 3wk1,2wk2,1wk1.  Approval given.  Also as an FYI Mr Krotzer fell at home 01/02/13 but had no signs of injury.

## 2013-01-09 ENCOUNTER — Telehealth: Payer: Self-pay | Admitting: *Deleted

## 2013-01-09 MED ORDER — HYDROCODONE-ACETAMINOPHEN 7.5-325 MG PO TABS: 1.0000 | ORAL_TABLET | Freq: Three times a day (TID) | ORAL | Status: DC

## 2013-01-09 NOTE — Telephone Encounter (Signed)
Andrew [name] with [facility] called to say patient has fallen.  He was shown and reivewed balance.  They have also call independent living again.  Hydrocodone called in.  Patient aware.

## 2013-01-09 NOTE — Telephone Encounter (Signed)
Andrew [name] called for a refill on his oxycodone Dan ordered at discharge 12/21/12. He says that he is still in quite a bit of pain.  See not in patient FYI's about pain medication.  He has an appointment with you on Friday.

## 2013-01-09 NOTE — Telephone Encounter (Signed)
Call in hydrocodone 7.5 /325 #90 TID prn no refill

## 2013-01-10 NOTE — Telephone Encounter (Signed)
No further narcotics from this office , inform pt

## 2013-01-10 NOTE — Telephone Encounter (Signed)
Pharmacy notified not to fill the hydrocodone from our office and we will no longer be writing pain meds for Andrew [name].  No answer at his phone # and since he has an appt in am, no message left. We can inform him at his appt.

## 2013-01-10 NOTE — Telephone Encounter (Signed)
Pharmacist called from [facility] about Andrew [name]. We called in the rx for hydrocodone 7.5/325 #90 ok'd by you 01/09/13.  He requested this on 01/08/13. The pharmacist called and said he just got 10/325 # 150 filled on 01/02/13 from a refill he had left from a previous rx.. I printed his NCCSR he had 4 refills on that rx as of 12/04/12.

## 2013-01-11 ENCOUNTER — Ambulatory Visit (HOSPITAL_BASED_OUTPATIENT_CLINIC_OR_DEPARTMENT_OTHER): Payer: Medicare Other | Admitting: Physical Medicine & Rehabilitation

## 2013-01-11 ENCOUNTER — Encounter: Payer: Medicare Other | Attending: Physical Medicine & Rehabilitation

## 2013-01-11 ENCOUNTER — Encounter: Payer: Self-pay | Admitting: Physical Medicine & Rehabilitation

## 2013-01-11 ENCOUNTER — Telehealth: Payer: Self-pay | Admitting: *Deleted

## 2013-01-11 VITALS — BP 118/59 | HR 79 | Resp 16 | Ht 71.0 in | Wt 155.0 lb

## 2013-01-11 DIAGNOSIS — Z89512 Acquired absence of left leg below knee: Secondary | ICD-10-CM

## 2013-01-11 DIAGNOSIS — S88119A Complete traumatic amputation at level between knee and ankle, unspecified lower leg, initial encounter: Secondary | ICD-10-CM

## 2013-01-11 DIAGNOSIS — Z4789 Encounter for other orthopedic aftercare: Secondary | ICD-10-CM | POA: Insufficient documentation

## 2013-01-11 DIAGNOSIS — I70209 Unspecified atherosclerosis of native arteries of extremities, unspecified extremity: Secondary | ICD-10-CM | POA: Insufficient documentation

## 2013-01-11 NOTE — Telephone Encounter (Signed)
FYI  Mr Andrew [name] called to [facility] to attempt to get pain medication from them after leaving our office. They will not prescribe narcotics either.

## 2013-01-11 NOTE — Telephone Encounter (Signed)
I reviewed his record and say where Dr. Trecia Rogers saw patient today and discussed his pain meds, wrote Rx. So I told Andrew [name] that we would not be giving him pain meds ( Please see Dr. Trecia Rogers note regarding the [facility] pharmacy calling).

## 2013-01-11 NOTE — Patient Instructions (Signed)
Vascular surgery will remove staples They can order your stump shrinker once her staples are removed. Primary care to prescribe your pain medicine. You cannot have multiple prescribers. There are several more refills left on the hydrocodone prescription from your primary care physician. You already filled hydrocodone 150 tablets 5 days before calling me for another pain medication prescription  Pain medicines can cause falls especially when taken with Klonopin

## 2013-01-11 NOTE — Telephone Encounter (Signed)
Andrew [name] called to request pain medication due to pain from his recent BKA on 12-10-12 by Dr. Edilia Bo; his next appt is on 01-22-13 with Dr. Arbie Cookey

## 2013-01-11 NOTE — Progress Notes (Signed)
Subjective:    Patient ID: Andrew [name], male    DOB: 20-Sep-1955, 57 y.o.   MRN: [id number]  HPI [name] with history of peripheral  vascular disease and multiple revascularization procedures most  recently, left second toe osteomyelitis with thrombectomy, left femoral  to peroneal bypass with angioplasty April, 2014. Presented Dec 06, 2012  with gangrenous foot and no relief with conservative care. Underwent  left below-knee amputation Dec 10, 2012, per Dr. Durwin Nora. Larey Seat a couple times at home. One was off a stool chair and one was where he forgot he had an amputation. Has not seen vascular surgery has appointment for 01/23/2019  [facility] from [facility] about Andrew [name]. We called in the rx for hydrocodone 7.5/325 #90  01/09/13. He requested this on 01/08/13. The pharmacist called and said he just got 10/325 # 150 filled on 01/02/13 from a refill he had left from a previous rx.. I printed his NCCSR he had 4 refills on that rx as of 12/04/12. Patient reports this was from primary physician.    Pain Inventory Average Pain 5 Pain Right Now 6 My pain is intermittent, sharp, burning, stabbing, tingling and aching  In the last 24 hours, has pain interfered with the following? General activity 6 Relation with others 5 Enjoyment of life 6 What TIME of day is your pain at its worst? night Sleep (in general) Poor  Pain is worse with: some activites Pain improves with: rest and medication Relief from Meds: 5  Mobility use a walker how many minutes can you walk? 15 ability to climb steps?  yes do you drive?  no use a wheelchair needs help with transfers  Function disabled: date disabled na I need assistance with the following:  household duties and shopping Do you have any goals in this area?  yes  Neuro/Psych trouble walking spasms anxiety  Prior Studies Any changes since last visit?  no  Physicians involved in your care Any changes since last visit?   no   Family History  Problem Relation Age of Onset  . Heart disease Mother   . Cancer Mother   . Alcohol abuse Father   . Heart disease Father   . Stroke Father    History   Social History  . Marital Status: Single    Spouse Name: N/A    Number of Children: N/A  . Years of Education: N/A   Social History Main Topics  . Smoking status: Current Every Day Smoker -- 0.50 packs/day for 40 years    Types: Cigarettes  . Smokeless tobacco: Never Used     Comment: trying to quit  . Alcohol Use: Yes     Comment: drinks beer or liquor daily. Last drink 11/09/12  . Drug Use: No  . Sexually Active: None   Other Topics Concern  . None   Social History Narrative   Financial assistance approved for 100% discount at [facility] and has [facility] card per Rudell Cobb   05/25/2010 Patient lives alone, has no car, is not using alcohol.            Past Surgical History  Procedure Laterality Date  . Carotid endarterectomy    . Penile prosthesis implant  1985    secondary to a forklift acciednt and crushed pelvis  . Pr vein bypass graft,aorto-fem-pop  2003    Dr. Madilyn Fireman  . Pr vein bypass graft,aorto-fem-pop  05/12/11    Left fem-peroneal BPG   . Bypass graft femoral-peroneal  06/12/2012    Procedure: BYPASS GRAFT [name];  Surgeon: Chuck Hint, MD;  Location: [facility] OR;  Service: Vascular;  Laterality: Left;  Redo fermoral - peroneal artery bypass using     composite propaten 6mm x 80cm and left saphenous vein.  . Bypass graft femoral-peroneal Left 11/10/2012    Procedure: Thrombectomy and Revision Left FEMORAL-PERONEAL Bypass Graft;  Surgeon: Larina Earthly, MD;  Location[facility] OR;  Service: Vascular;  Laterality: Left;  . Amputation Left 12/06/2012    Procedure: AMPUTATION DIGIT;  Surgeon: Chuck Hint, MD;  Location: [facility] OR;  Service: Vascular;  Laterality: Left;  2ND TOE  . Amputation Left 12/10/2012    Procedure: AMPUTATION BELOW KNEE ;  Surgeon: Larina Earthly, MD;  Location: [facility] OR;   Service: Vascular;  Laterality: Left;   Past Medical History  Diagnosis Date  . Tobacco abuse   . 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 Manson Passey).  Pt will likely require a wide resection and  has been refered to ENT (2/12)  . Dental caries   . Glucose intolerance (impaired glucose tolerance)     Max random CBG 130 (08/29/07) typically about 100. A1C in 11/08 was 5.4.  . Peripheral vascular disease   . Hypertension, essential   . Dyslipidemia   . Anxiety   . Thoracic kyphosis   . Back pain      The patient has chronic lumbar/thoracic back pain, he has kyphosis, mild facet hypertrophy at L1-L2, circumferential annular bulging and mild vertebral body osteophytic formation at L2-L3, mild to moderate stenosis of the medial aspect of the neural foramen at L4-L5, and mild annular bulging at L5-S1.  Marland Kitchen History of alcohol abuse   . Hyperlipidemia   . Peripheral arterial disease   . Leg pain   . Back pain   . Ulcer   . Diabetes mellitus     borderline yrs ago  . GERD (gastroesophageal reflux disease)    BP 118/59  Pulse 79  Resp 16  Ht 5\' 11"  (1.803 m)  Wt 155 lb (70.308 kg)  BMI 21.63 kg/m2  SpO2 98%     Review of Systems  Musculoskeletal: Positive for gait problem.  Neurological:       Spasms  Psychiatric/Behavioral: Positive for agitation.  All other systems reviewed and are negative.       Objective:   Physical Exam  Nursing note and vitals reviewed. Constitutional: He appears well-developed and well-nourished.  HENT:  Head: Normocephalic and atraumatic.  Eyes: Conjunctivae and EOM are normal. Pupils are equal, round, and reactive to light.  Psychiatric: His affect is blunt. His speech is delayed and slurred. He is slowed.    Stump shows mild edema. No drainage from the incision line. Staples are intact. No tenderness along the stump.  Motor strength 5/5 in bilateral upper extremities  and right lower extremity 5/5 strength in the right hip flexor hip abductor and hip adductor Right foot clean dry and intact. Pulses good      Assessment & Plan:  1. 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 You'll need outpatient therapy once you are fitted with your prosthesis and receive it 2. Follow up with primary care for general medical care as well as pain medications.

## 2013-01-16 ENCOUNTER — Ambulatory Visit: Payer: Medicare Other | Admitting: Neurosurgery

## 2013-01-21 ENCOUNTER — Encounter: Payer: Self-pay | Admitting: Vascular Surgery

## 2013-01-22 ENCOUNTER — Encounter: Payer: Self-pay | Admitting: Vascular Surgery

## 2013-01-22 ENCOUNTER — Other Ambulatory Visit: Payer: Self-pay | Admitting: *Deleted

## 2013-01-22 ENCOUNTER — Ambulatory Visit (INDEPENDENT_AMBULATORY_CARE_PROVIDER_SITE_OTHER): Payer: Medicare Other | Admitting: Vascular Surgery

## 2013-01-22 VITALS — BP 99/65 | HR 76 | Resp 16 | Ht 71.0 in | Wt 140.0 lb

## 2013-01-22 DIAGNOSIS — Z4802 Encounter for removal of sutures: Secondary | ICD-10-CM | POA: Insufficient documentation

## 2013-01-22 DIAGNOSIS — Z48812 Encounter for surgical aftercare following surgery on the circulatory system: Secondary | ICD-10-CM

## 2013-01-22 DIAGNOSIS — I739 Peripheral vascular disease, unspecified: Secondary | ICD-10-CM | POA: Insufficient documentation

## 2013-01-22 DIAGNOSIS — M79609 Pain in unspecified limb: Secondary | ICD-10-CM

## 2013-01-22 NOTE — Telephone Encounter (Signed)
Scheduled appointment 6/25 Pt ha BKA 5/14

## 2013-01-22 NOTE — Progress Notes (Signed)
Today for followup of left below-knee amputation on 12/10/2012. He has had complete healing of his dictation stump and will have staples removed today. He is seeing [employer] who is getting him with a prosthesis. He is walking independently with a walker. He does report some ongoing phantom type pain that this is tolerable.  Impression and plan a final healing of the below-knee amputation. We'll see [place] again on an as-needed basis

## 2013-01-23 ENCOUNTER — Other Ambulatory Visit: Payer: Self-pay | Admitting: Internal Medicine

## 2013-01-30 ENCOUNTER — Encounter: Payer: Medicare Other | Admitting: Internal Medicine

## 2013-01-31 ENCOUNTER — Other Ambulatory Visit: Payer: Self-pay | Admitting: Internal Medicine

## 2013-02-28 ENCOUNTER — Ambulatory Visit (INDEPENDENT_AMBULATORY_CARE_PROVIDER_SITE_OTHER): Payer: Medicare Other | Admitting: Internal Medicine

## 2013-02-28 ENCOUNTER — Encounter: Payer: Self-pay | Admitting: Internal Medicine

## 2013-02-28 VITALS — BP 103/61 | HR 74 | Temp 98.1°F | Ht 71.0 in | Wt 146.4 lb

## 2013-02-28 DIAGNOSIS — S88119A Complete traumatic amputation at level between knee and ankle, unspecified lower leg, initial encounter: Secondary | ICD-10-CM

## 2013-02-28 DIAGNOSIS — M549 Dorsalgia, unspecified: Secondary | ICD-10-CM

## 2013-02-28 DIAGNOSIS — I10 Essential (primary) hypertension: Secondary | ICD-10-CM

## 2013-02-28 DIAGNOSIS — F17200 Nicotine dependence, unspecified, uncomplicated: Secondary | ICD-10-CM

## 2013-02-28 DIAGNOSIS — Z89512 Acquired absence of left leg below knee: Secondary | ICD-10-CM

## 2013-02-28 MED ORDER — HYDROCODONE-ACETAMINOPHEN 10-325 MG PO TABS: 1.0000 | ORAL_TABLET | ORAL | Status: DC | PRN

## 2013-02-28 MED ORDER — VARENICLINE TARTRATE 1 MG PO TABS: 1.0000 mg | ORAL_TABLET | Freq: Two times a day (BID) | ORAL | Status: DC

## 2013-02-28 MED ORDER — GABAPENTIN 300 MG PO CAPS: 300.0000 mg | ORAL_CAPSULE | Freq: Three times a day (TID) | ORAL | Status: DC

## 2013-02-28 NOTE — Assessment & Plan Note (Signed)
The patient has a history of chronic pain, due to DDD Lumbar Spine.  He violated his previous pain contract with our clinic by receiving narcotics from multiple providers from 11/2012-12/2012.  However, this was during a time period of foot gangrene and a BKA, in which the patient had significant pain.  After a long conversation today, I believe poor health literacy led to this violation of his contract.  As such, we have signed a new pain contract today, but explicitly reviewed the terms of the contract. -signed contract for Hydrocodone-Acetaminophen, 10-325 mg, #150 tabs/month

## 2013-02-28 NOTE — Patient Instructions (Signed)
General Instructions: For your pain, we are re-signing a pain contract today.  Please read this document carefully.  Any violations in this pain contract will lead to termination of the contract, and you may no longer be able to get pain medications from our clinic.  Please return for a follow-up visit in 3-4 months.   Treatment Goals:  Goals (1 Years of Data) as of 02/28/13     Lifestyle    . Prevent Falls       Progress Toward Treatment Goals:  Treatment Goal 02/28/2013  Blood pressure at goal  Stop smoking smoking the same amount  Prevent falls unchanged    Self Care Goals & Plans:  Self Care Goal 02/28/2013  Manage my medications take my medicines as prescribed; bring my medications to every visit; refill my medications on time  Eat healthy foods eat foods that are low in salt; drink diet soda or water instead of juice or soda  Be physically active (No Data)  Stop smoking call [facility] (1-800-QUIT-NOW)       Care Management & Community Referrals:  Referral 02/28/2013  Referrals made for care management support none needed

## 2013-02-28 NOTE — Progress Notes (Signed)
HPI The patient is a 57 y.o. male with a history of HTN, HL, PVD, presenting for a follow-up visit.  The patient underwent left BKA 12/10/12.  He is recovering, but continues to note pain.  He is working on getting a prosthetic leg, once his stump has healed better, per patient report.  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  The patient asks for a refill of his Hydrocodone-acetaminophen.  He has had a pain contract for this medication from our clinic.  In April and May, he received narcotic prescriptions from other providers, from the vascular and vein specialists, and the pain clinic, in the setting of gangrene of the lower extremity and amputations.  These entities have now decided to no longer provide this medication for the patient.  The patient continues to smoke.  He notes quitting for 40 days related to his last hospitalization, but relapsed due to triggers such as his friends smoking.  He is currently smoking at least 1/2 PPD.  He tried Chantix in the past, but quit due to "the craziest dreams of my life".  The patient took Wellbutrin in the past, but didn't note much success.  The patient would still like to retry Chantix.  ROS: General: no fevers, chills, changes in weight, changes in appetite Skin: no rash HEENT: no blurry vision, hearing changes, sore throat Pulm: no dyspnea, coughing, wheezing CV: no chest pain, palpitations, shortness of breath Abd: no abdominal pain, nausea/vomiting, diarrhea/constipation GU: no dysuria, hematuria, polyuria Ext: no arthralgias, myalgias Neuro: no weakness, numbness, or tingling  Filed Vitals:   02/28/13 1543  BP: 103/61  Pulse: 74  Temp: 98.1 F (36.7 C)    PEX General: alert, cooperative, and in no apparent distress HEENT: pupils equal round and reactive to light, vision grossly intact, oropharynx clear and non-erythematous  Neck: supple, no  lymphadenopathy Lungs: clear to ascultation bilaterally, normal work of respiration, no wheezes, rales, ronchi Heart: regular rate and rhythm, no murmurs, gallops, or rubs Abdomen: soft, non-tender, non-distended, normal bowel sounds Extremities: no cyanosis, clubbing, or edema. S/p L BKA Neurologic: alert & oriented X3, cranial nerves II-XII intact, strength grossly intact, sensation intact to light touch  Current Outpatient Prescriptions on File Prior to Visit  Medication Sig Dispense Refill  . aspirin EC 81 MG tablet Take 1 tablet (81 mg total) by mouth daily.      . clonazePAM (KLONOPIN) 1 MG tablet Take 1 mg by mouth 3 (three) times daily as needed for anxiety.      . enalapril (VASOTEC) 20 MG tablet Take 1 tablet (20 mg total) by mouth daily.  30 tablet  3  . gabapentin (NEURONTIN) 100 MG capsule Take 1 capsule (100 mg total) by mouth 3 (three) times daily.  90 capsule  1  . hydrochlorothiazide (HYDRODIURIL) 25 MG tablet Take 1 tablet (25 mg total) by mouth daily.  30 tablet  11  . HYDROcodone-acetaminophen (NORCO) 7.5-325 MG per tablet Take 1 tablet by mouth 3 (three) times daily. Must last 30 days.  90 tablet  0  . methocarbamol (ROBAXIN) 500 MG tablet Take 1 tablet (500 mg total) by mouth every 8 (eight) hours as needed.  60 tablet  0  . metoprolol (LOPRESSOR) 50 MG tablet       . Multiple Vitamins-Minerals (MULTIVITAMIN WITH MINERALS) tablet Take 1 tablet by mouth daily.      Marland Kitchen omeprazole (PRILOSEC) 20  MG capsule Take 1 capsule (20 mg total) by mouth daily.  30 capsule  11  . Oxycodone HCl 10 MG TABS       . oxyCODONE-acetaminophen (PERCOCET/ROXICET) 5-325 MG per tablet       . sertraline (ZOLOFT) 100 MG tablet Take 1 tablet (100 mg total) by mouth daily.  30 tablet  11  . simvastatin (ZOCOR) 40 MG tablet TAKE 1 TABLET BY MOUTH AT BEDTIME.  30 tablet  11  . traMADol (ULTRAM) 50 MG tablet       . varenicline (CHANTIX) 1 MG tablet Take 1 tablet (1 mg total) by mouth 2 (two) times  daily.  60 tablet  0  . Vitamins-Lipotropics (B COMPLEX FORMULA 1 PO) Take 1 tablet by mouth daily.         No current facility-administered medications on file prior to visit.    Assessment/Plan

## 2013-02-28 NOTE — Assessment & Plan Note (Signed)
The patient is s/p BKA, with a well-healing stump.  He still notes phantom limb pain. -prescribed gabapentin for this pain

## 2013-02-28 NOTE — Assessment & Plan Note (Signed)
BP Readings from Last 3 Encounters:  02/28/13 103/61  01/22/13 99/65  01/11/13 118/59    Lab Results  Component Value Date   NA 133* 12/14/2012   K 4.2 12/14/2012   CREATININE 0.69 12/20/2012    Assessment: Blood pressure control: controlled Progress toward BP goal:  at goal Comments: BP at goal  Plan: Medications:  continue current medications Educational resources provided:   Self management tools provided:   Other plans: Recheck at next visit

## 2013-02-28 NOTE — Assessment & Plan Note (Signed)
  Assessment: Progress toward smoking cessation:  smoking the same amount Barriers to progress toward smoking cessation:  withdrawal symptoms Comments: The patient would like to try quitting again  Plan: Instruction/counseling given:  I counseled patient on the dangers of tobacco use, advised patient to stop smoking, and reviewed strategies to maximize success. Educational resources provided:  [facility] [employer]) brochure Self management tools provided:  smoking cessation plan (STAR Quit Plan) Medications to assist with smoking cessation:  Varenicline (Chantix) Patient agreed to the following self-care plans for smoking cessation: call [facility] (1-800-QUIT-NOW)  Other plans: Prescribed Chantix for smoking cessation

## 2013-03-01 NOTE — Progress Notes (Signed)
Case discussed with Dr. Brown at the time of the visit.  We reviewed the resident's history and exam and pertinent patient test results.  I agree with the assessment, diagnosis, and plan of care documented in the resident's note. 

## 2013-03-23 ENCOUNTER — Other Ambulatory Visit: Payer: Self-pay | Admitting: Internal Medicine

## 2013-03-29 ENCOUNTER — Other Ambulatory Visit: Payer: Self-pay | Admitting: Internal Medicine

## 2013-03-29 NOTE — Telephone Encounter (Signed)
[facility]opin [facility]. 

## 2013-04-02 ENCOUNTER — Other Ambulatory Visit: Payer: Self-pay | Admitting: Internal Medicine

## 2013-04-02 NOTE — Telephone Encounter (Signed)
Called to pharm 

## 2013-04-24 ENCOUNTER — Telehealth: Payer: Self-pay | Admitting: Internal Medicine

## 2013-04-24 DIAGNOSIS — H9191 Unspecified hearing loss, right ear: Secondary | ICD-10-CM

## 2013-04-24 NOTE — Telephone Encounter (Signed)
I received a call from the patient requesting a referral to ENT.  The patient notes that for the past year, he has had decreased hearing in his right ear.  He notes that symptoms have come and gone over this time period, but have been worse for the last 1 month.   He notes normal hearing in his left ear.  Ideally I would like to see the patient first for an initial evaluation.  However, since financial barriers exist, and the patient has seen Dr. Jearld Fenton in the past, will refer back to ENT without an office visit, if ENT will allow this.  Awanda Mink 04/24/2013, 5:20 PM

## 2013-05-01 ENCOUNTER — Ambulatory Visit: Payer: Medicare Other | Attending: Vascular Surgery | Admitting: Physical Therapy

## 2013-05-01 DIAGNOSIS — R269 Unspecified abnormalities of gait and mobility: Secondary | ICD-10-CM | POA: Insufficient documentation

## 2013-05-01 DIAGNOSIS — R5381 Other malaise: Secondary | ICD-10-CM | POA: Insufficient documentation

## 2013-05-01 DIAGNOSIS — Z4789 Encounter for other orthopedic aftercare: Secondary | ICD-10-CM | POA: Insufficient documentation

## 2013-05-07 ENCOUNTER — Ambulatory Visit: Payer: Medicare Other | Admitting: Physical Therapy

## 2013-05-09 ENCOUNTER — Ambulatory Visit: Payer: Medicare Other | Attending: Vascular Surgery | Admitting: Physical Therapy

## 2013-05-09 DIAGNOSIS — R269 Unspecified abnormalities of gait and mobility: Secondary | ICD-10-CM | POA: Insufficient documentation

## 2013-05-09 DIAGNOSIS — Z4789 Encounter for other orthopedic aftercare: Secondary | ICD-10-CM | POA: Insufficient documentation

## 2013-05-09 DIAGNOSIS — R5381 Other malaise: Secondary | ICD-10-CM | POA: Insufficient documentation

## 2013-05-14 ENCOUNTER — Ambulatory Visit: Payer: Medicare Other | Admitting: Physical Therapy

## 2013-05-16 ENCOUNTER — Ambulatory Visit: Payer: Medicare Other | Admitting: Physical Therapy

## 2013-05-21 ENCOUNTER — Encounter: Payer: Medicare Other | Admitting: Physical Therapy

## 2013-05-22 ENCOUNTER — Other Ambulatory Visit: Payer: Self-pay | Admitting: *Deleted

## 2013-05-22 ENCOUNTER — Emergency Department (HOSPITAL_COMMUNITY)
Admission: EM | Admit: 2013-05-22 | Discharge: 2013-05-23 | Disposition: A | Discharge status: Home / Self Care | Payer: Medicare Other | Attending: Emergency Medicine | Admitting: Emergency Medicine

## 2013-05-22 ENCOUNTER — Encounter (HOSPITAL_COMMUNITY): Payer: Self-pay | Admitting: Emergency Medicine

## 2013-05-22 DIAGNOSIS — F411 Generalized anxiety disorder: Secondary | ICD-10-CM | POA: Insufficient documentation

## 2013-05-22 DIAGNOSIS — Z85828 Personal history of other malignant neoplasm of skin: Secondary | ICD-10-CM | POA: Insufficient documentation

## 2013-05-22 DIAGNOSIS — M4000 Postural kyphosis, site unspecified: Secondary | ICD-10-CM | POA: Insufficient documentation

## 2013-05-22 DIAGNOSIS — E785 Hyperlipidemia, unspecified: Secondary | ICD-10-CM | POA: Insufficient documentation

## 2013-05-22 DIAGNOSIS — Z872 Personal history of diseases of the skin and subcutaneous tissue: Secondary | ICD-10-CM | POA: Insufficient documentation

## 2013-05-22 DIAGNOSIS — Z7982 Long term (current) use of aspirin: Secondary | ICD-10-CM | POA: Insufficient documentation

## 2013-05-22 DIAGNOSIS — F17200 Nicotine dependence, unspecified, uncomplicated: Secondary | ICD-10-CM | POA: Insufficient documentation

## 2013-05-22 DIAGNOSIS — I10 Essential (primary) hypertension: Secondary | ICD-10-CM | POA: Insufficient documentation

## 2013-05-22 DIAGNOSIS — Z9889 Other specified postprocedural states: Secondary | ICD-10-CM | POA: Insufficient documentation

## 2013-05-22 DIAGNOSIS — K219 Gastro-esophageal reflux disease without esophagitis: Secondary | ICD-10-CM | POA: Insufficient documentation

## 2013-05-22 DIAGNOSIS — I739 Peripheral vascular disease, unspecified: Secondary | ICD-10-CM | POA: Insufficient documentation

## 2013-05-22 DIAGNOSIS — K2970 Gastritis, unspecified, without bleeding: Secondary | ICD-10-CM | POA: Insufficient documentation

## 2013-05-22 DIAGNOSIS — Z79899 Other long term (current) drug therapy: Secondary | ICD-10-CM | POA: Insufficient documentation

## 2013-05-22 NOTE — ED Notes (Signed)
Pt. Started having chest pain starting at 2230. Took 324 ASA and given 2 nitro by EMS. ETOH. Hx of HTN

## 2013-05-22 NOTE — Telephone Encounter (Signed)
Last refill 9/20  Per pharmacy Call pt when ready @ [phone]

## 2013-05-22 NOTE — ED Notes (Addendum)
Pt. C/o chest pain starting around 2230. Denies N/V, diaphoresis, SOB. Does c/o diarrhea x48 hours. States "This always happens when I run out of my pain medicine".  Reports taking hydrocodone but ran out of it on Sunday.

## 2013-05-23 ENCOUNTER — Ambulatory Visit: Payer: Medicare Other | Admitting: Physical Therapy

## 2013-05-23 ENCOUNTER — Emergency Department (HOSPITAL_COMMUNITY): Payer: Medicare Other

## 2013-05-23 LAB — CBC WITH DIFFERENTIAL/PLATELET
Basophils Relative: 0 % (ref 0–1)
Eosinophils Absolute: 0.3 10*3/uL (ref 0.0–0.7)
Eosinophils Relative: 2 % (ref 0–5)
HCT: 37.2 % — ABNORMAL LOW (ref 39.0–52.0)
Hemoglobin: 13 g/dL (ref 13.0–17.0)
Lymphs Abs: 2.2 10*3/uL (ref 0.7–4.0)
MCH: 29.8 pg (ref 26.0–34.0)
MCHC: 34.9 g/dL (ref 30.0–36.0)
MCV: 85.3 fL (ref 78.0–100.0)
Monocytes Absolute: 1 10*3/uL (ref 0.1–1.0)
Monocytes Relative: 8 % (ref 3–12)

## 2013-05-23 LAB — POCT I-STAT, CHEM 8
Calcium, Ion: 1.18 mmol/L (ref 1.12–1.23)
Creatinine, Ser: 1 mg/dL (ref 0.50–1.35)
Glucose, Bld: 99 mg/dL (ref 70–99)
HCT: 41 % (ref 39.0–52.0)
Hemoglobin: 13.9 g/dL (ref 13.0–17.0)

## 2013-05-23 LAB — POCT I-STAT TROPONIN I
Troponin i, poc: 0 ng/mL (ref 0.00–0.08)
Troponin i, poc: 0 ng/mL (ref 0.00–0.08)

## 2013-05-23 MED ORDER — HYDROCODONE-ACETAMINOPHEN 5-325 MG PO TABS
1.0000 | ORAL_TABLET | Freq: Once | ORAL | Status: AC
  Administered 2013-05-23: 1 via ORAL
  Filled 2013-05-23: qty 1

## 2013-05-23 MED ORDER — GI COCKTAIL ~~LOC~~
30.0000 mL | Freq: Once | ORAL | Status: AC
  Administered 2013-05-23: 30 mL via ORAL
  Filled 2013-05-23: qty 30

## 2013-05-23 MED ORDER — POTASSIUM CHLORIDE CRYS ER 20 MEQ PO TBCR
40.0000 meq | EXTENDED_RELEASE_TABLET | Freq: Once | ORAL | Status: AC
  Administered 2013-05-23: 40 meq via ORAL
  Filled 2013-05-23: qty 2

## 2013-05-23 MED ORDER — HYDROCODONE-ACETAMINOPHEN 10-325 MG PO TABS: 1.0000 | ORAL_TABLET | ORAL | Status: DC | PRN

## 2013-05-23 MED ORDER — SUCRALFATE 1 GM/10ML PO SUSP: 1.0000 g | Freq: Four times a day (QID) | ORAL | Status: DC

## 2013-05-23 MED ORDER — KETOROLAC TROMETHAMINE 30 MG/ML IJ SOLN
30.0000 mg | Freq: Once | INTRAMUSCULAR | Status: AC
  Administered 2013-05-23: 30 mg via INTRAVENOUS
  Filled 2013-05-23: qty 1

## 2013-05-23 NOTE — ED Provider Notes (Signed)
CSN: [id number]     Arrival date & time 05/22/13  2331 History   First MD Initiated Contact with Patient 05/22/13 2349     Chief Complaint  Patient presents with  . Chest Pain   (Consider location/radiation/quality/duration/timing/severity/associated sxs/prior Treatment) Patient is a 57 y.o. male presenting with chest pain. The history is provided by the patient.  Chest Pain Pain location:  Substernal area Pain quality: burning   Pain radiates to:  Does not radiate Pain radiates to the back: no   Pain severity:  Moderate Onset quality:  Sudden Timing:  Constant Progression:  Unchanged Chronicity:  Recurrent Context: eating   Context comment:  Drinking alcohol Relieved by:  Nothing Worsened by:  Nothing tried Ineffective treatments:  None tried Associated symptoms: no abdominal pain, no anorexia, no fever, no numbness and no shortness of breath   Risk factors: no prior DVT/PE     Past Medical History  Diagnosis Date  . Tobacco abuse   . 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 Manson Passey).  Pt will likely require a wide resection and  has been refered to ENT (2/12)  . Dental caries   . Glucose intolerance (impaired glucose tolerance)     Max random CBG 130 (08/29/07) typically about 100. A1C in 11/08 was 5.4.  . Peripheral vascular disease   . Hypertension, essential   . Dyslipidemia   . Anxiety   . Thoracic kyphosis   . Back pain      The patient has chronic lumbar/thoracic back pain, he has kyphosis, mild facet hypertrophy at L1-L2, circumferential annular bulging and mild vertebral body osteophytic formation at L2-L3, mild to moderate stenosis of the medial aspect of the neural foramen at L4-L5, and mild annular bulging at L5-S1.  Marland Kitchen History of alcohol abuse   . Hyperlipidemia   . Peripheral arterial disease   . Leg pain   . Back pain   . Ulcer   . Diabetes mellitus     borderline yrs ago  .  GERD (gastroesophageal reflux disease)    Past Surgical History  Procedure Laterality Date  . Carotid endarterectomy    . Penile prosthesis implant  1985    secondary to a forklift acciednt and crushed pelvis  . Pr vein bypass graft,aorto-fem-pop  2003    Dr. Madilyn Fireman  . Pr vein bypass graft,aorto-fem-pop  05/12/11    Left fem-peroneal BPG   . Bypass graft femoral-peroneal  06/12/2012    Procedure: BYPASS GRAFT FEMORAL-PERONEAL;  Surgeon: Chuck Hint, MD;  Location: [facility] OR;  Service: Vascular;  Laterality: Left;  Redo fermoral - peroneal artery bypass using     composite propaten 6mm x 80cm and left saphenous vein.  . Bypass graft femoral-peroneal Left 11/10/2012    Procedure: Thrombectomy and Revision Left FEMORAL-PERONEAL Bypass Graft;  Surgeon: Larina Earthly, MD;  Location: [facility] OR;  Service: Vascular;  Laterality: Left;  . Amputation Left 12/06/2012    Procedure: AMPUTATION DIGIT;  Surgeon: Chuck Hint, MD;  Location: [facility] OR;  Service: Vascular;  Laterality: Left;  2ND TOE  . Amputation Left 12/10/2012    Procedure: AMPUTATION BELOW KNEE ;  Surgeon: Larina Earthly, MD;  Location: [facility] OR;  Service: Vascular;  Laterality: Left;   Family History  Problem Relation Age of Onset  . Heart disease Mother   . Cancer Mother   . Alcohol abuse Father   .  Heart disease Father   . Stroke Father    History  Substance Use Topics  . Smoking status: Current Every Day Smoker -- 0.50 packs/day for 40 years    Types: Cigarettes  . Smokeless tobacco: Never Used     Comment: trying to quit  . Alcohol Use: Yes     Comment: drinks beer or liquor daily. Last drink 11/09/12    Review of Systems  Constitutional: Negative for fever.  Respiratory: Negative for shortness of breath.   Cardiovascular: Positive for chest pain.  Gastrointestinal: Negative for abdominal pain and anorexia.  Neurological: Negative for numbness.  All other systems reviewed and are negative.    Allergies  Review of  patient's allergies indicates no known allergies.  Home Medications   Current Outpatient Rx  Name  Route  Sig  Dispense  Refill  . aspirin EC 81 MG tablet   Oral   Take 1 tablet (81 mg total) by mouth daily.         . clonazePAM (KLONOPIN) 1 MG tablet   Oral   Take 1 mg by mouth 3 (three) times daily as needed for anxiety.         . enalapril (VASOTEC) 20 MG tablet   Oral   Take 20 mg by mouth daily.         Marland Kitchen gabapentin (NEURONTIN) 300 MG capsule   Oral   Take 1 capsule (300 mg total) by mouth 3 (three) times daily.   90 capsule   3   . hydrochlorothiazide (HYDRODIURIL) 25 MG tablet   Oral   Take 1 tablet (25 mg total) by mouth daily.   30 tablet   11   . HYDROcodone-acetaminophen (NORCO) 10-325 MG per tablet   Oral   Take 1 tablet by mouth every 4 (four) hours as needed.   150 tablet   3   . Multiple Vitamins-Minerals (MULTIVITAMIN WITH MINERALS) tablet   Oral   Take 1 tablet by mouth daily.         Marland Kitchen omeprazole (PRILOSEC) 20 MG capsule   Oral   Take 1 capsule (20 mg total) by mouth daily.   30 capsule   11   . sertraline (ZOLOFT) 100 MG tablet   Oral   Take 1 tablet (100 mg total) by mouth daily.   30 tablet   11   . simvastatin (ZOCOR) 40 MG tablet   Oral   Take 40 mg by mouth every evening.         . traMADol (ULTRAM) 50 MG tablet   Oral   Take 50 mg by mouth every 6 (six) hours as needed for pain.         . Vitamins-Lipotropics (B COMPLEX FORMULA 1 PO)   Oral   Take 1 tablet by mouth daily.            BP 106/69  Pulse 72  Temp(Src) 98.3 F (36.8 C) (Oral)  Resp 19  Wt 140 lb (63.504 kg)  BMI 19.53 kg/m2  SpO2 97% Physical Exam  Constitutional: He is oriented to person, place, and time. He appears well-developed and well-nourished. No distress.  HENT:  Head: Normocephalic and atraumatic.  Mouth/Throat: Oropharynx is clear and moist.  Eyes: Conjunctivae are normal. Pupils are equal, round, and reactive to light.  Neck:  Normal range of motion. Neck supple.  Cardiovascular: Normal rate, regular rhythm and intact distal pulses.   Pulmonary/Chest: Effort normal and breath sounds normal. He has no  wheezes. He has no rales.  Abdominal: Soft. Bowel sounds are normal. There is no tenderness. There is no rebound and no guarding.  Musculoskeletal: Normal range of motion.  Neurological: He is alert and oriented to person, place, and time.  Skin: Skin is warm and dry.  Psychiatric: He has a normal mood and affect.    ED Course  Procedures (including critical care time) Labs Review Labs Reviewed  CBC WITH DIFFERENTIAL - Abnormal; Notable for the following:    WBC 12.2 (*)    HCT 37.2 (*)    Neutro Abs 8.7 (*)    All other components within normal limits  POCT I-STAT, CHEM 8 - Abnormal; Notable for the following:    Potassium 3.2 (*)    BUN 5 (*)    All other components within normal limits  POCT I-STAT TROPONIN I   Imaging Review Dg Chest 2 View  05/23/2013   CLINICAL DATA:  Mid chest pain  EXAM: CHEST  2 VIEW  COMPARISON:  06/11/2012  FINDINGS: Hyperinflation without infiltrate or edema. No effusion or pneumothorax. Normal heart size. No acute osseous findings.  IMPRESSION: COPD. No evidence of acute superimposed disease.   Electronically Signed   By: Tiburcio Pea M.D.   On: 05/23/2013 01:25    EKG Interpretation     Ventricular Rate:  83 PR Interval:  134 QRS Duration: 90 QT Interval:  364 QTC Calculation: 428 R Axis:   75 Text Interpretation:  Sinus rhythm Left atrial enlargement Anteroseptal infarct, age indeterminate Baseline wander in lead(s) V3            MDM  No diagnosis found.   Date: 05/23/2013  Rate: 83  Rhythm: normal sinus rhythm  QRS Axis: normal  Intervals: normal  ST/T Wave abnormalities: normal  Conduction Disutrbances:none  Narrative Interpretation: baseline wander in v 3  Old EKG Reviewed: changes noted   In the setting of ongoing symptoms with negative EKG  and 2 negative troponins ACS is excluded.  Suspect drug seeking behavior.  Follow up with your PMD  Ryleigh Esqueda K Vernice Mannina-Rasch, MD 05/23/13 [phone]

## 2013-05-23 NOTE — Telephone Encounter (Signed)
Pt leaves message this am that he knows norco is early but he is dying of pain and has run out of med

## 2013-05-28 ENCOUNTER — Ambulatory Visit: Payer: Medicare Other | Admitting: Physical Therapy

## 2013-05-30 ENCOUNTER — Ambulatory Visit: Payer: Medicare Other | Admitting: Physical Therapy

## 2013-06-04 ENCOUNTER — Ambulatory Visit: Payer: Medicare Other | Admitting: Physical Therapy

## 2013-06-06 ENCOUNTER — Ambulatory Visit: Payer: Medicare Other | Admitting: Physical Therapy

## 2013-06-11 ENCOUNTER — Ambulatory Visit: Payer: Medicare Other | Admitting: Physical Therapy

## 2013-06-12 ENCOUNTER — Encounter: Payer: Medicare Other | Admitting: Internal Medicine

## 2013-06-12 ENCOUNTER — Encounter: Payer: Self-pay | Admitting: Internal Medicine

## 2013-06-13 ENCOUNTER — Ambulatory Visit: Payer: Medicare Other | Admitting: Physical Therapy

## 2013-06-17 ENCOUNTER — Encounter: Payer: Medicare Other | Admitting: Physical Therapy

## 2013-06-18 ENCOUNTER — Ambulatory Visit: Payer: Medicare Other | Attending: Vascular Surgery | Admitting: Physical Therapy

## 2013-06-18 DIAGNOSIS — R5381 Other malaise: Secondary | ICD-10-CM | POA: Insufficient documentation

## 2013-06-18 DIAGNOSIS — R269 Unspecified abnormalities of gait and mobility: Secondary | ICD-10-CM | POA: Insufficient documentation

## 2013-06-18 DIAGNOSIS — Z4789 Encounter for other orthopedic aftercare: Secondary | ICD-10-CM | POA: Insufficient documentation

## 2013-06-20 ENCOUNTER — Ambulatory Visit: Payer: Medicare Other | Admitting: Physical Therapy

## 2013-06-22 ENCOUNTER — Other Ambulatory Visit: Payer: Self-pay | Admitting: Internal Medicine

## 2013-06-25 ENCOUNTER — Ambulatory Visit: Payer: Medicare Other | Admitting: Physical Therapy

## 2013-06-26 ENCOUNTER — Ambulatory Visit (INDEPENDENT_AMBULATORY_CARE_PROVIDER_SITE_OTHER): Payer: Medicare Other | Admitting: Internal Medicine

## 2013-06-26 ENCOUNTER — Encounter: Payer: Self-pay | Admitting: Internal Medicine

## 2013-06-26 ENCOUNTER — Ambulatory Visit: Payer: Medicare Other | Admitting: Physical Therapy

## 2013-06-26 VITALS — BP 132/78 | HR 64 | Temp 98.1°F | Ht 71.0 in | Wt 152.9 lb

## 2013-06-26 DIAGNOSIS — Z89512 Acquired absence of left leg below knee: Secondary | ICD-10-CM

## 2013-06-26 DIAGNOSIS — S88119A Complete traumatic amputation at level between knee and ankle, unspecified lower leg, initial encounter: Secondary | ICD-10-CM

## 2013-06-26 DIAGNOSIS — Z23 Encounter for immunization: Secondary | ICD-10-CM

## 2013-06-26 DIAGNOSIS — Z0000 Encounter for general adult medical examination without abnormal findings: Secondary | ICD-10-CM

## 2013-06-26 DIAGNOSIS — F17200 Nicotine dependence, unspecified, uncomplicated: Secondary | ICD-10-CM

## 2013-06-26 MED ORDER — NICOTINE 21 MG/24HR TD PT24: 21.0000 mg | MEDICATED_PATCH | TRANSDERMAL | Status: DC

## 2013-06-26 NOTE — Assessment & Plan Note (Signed)
  Assessment: Progress toward smoking cessation:  smoking the same amount Barriers to progress toward smoking cessation:  other tobacco users at home Comments: The patient continues to struggle with smoking cessation, though he recognizes the need for smoking cessation.  No success with Chantix.  Will re-try nicotine patch.  Plan: Instruction/counseling given:  I counseled patient on the dangers of tobacco use, advised patient to stop smoking, and reviewed strategies to maximize success. Educational resources provided:    Self management tools provided:    Medications to assist with smoking cessation:  Nicotine Patch Patient agreed to the following self-care plans for smoking cessation:    Other plans: Reassess at next visit

## 2013-06-26 NOTE — Assessment & Plan Note (Signed)
The patient still notes some phantom limb pain, though this is somewhat improved with gabapentin.  Patient's last fall was 1.5 months ago.  He continues to work with physical therapy.

## 2013-06-26 NOTE — Progress Notes (Signed)
HPI The patient is a 57 y.o. 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  The patient continues to smoke, currently smoking about 1ppd.  He recognizes the need to quit smoking.  He identifies environmental triggers as the biggest barrier to smoking cessation.  He filled chantix prescription, but was unsuccessful in quitting, so he stopped this medication.  He has tried wellbutrin and nicotine patches in the past.  The patient continues to have chronic back pain and leg pain.  The patient still experiences phantom limb pain, felt in his left 2nd and 3rd toes.  He notes no recent falls, with his last fall 1.5 month ago.  He is still getting PT, Tues/Thurs.  Gabapentin has helped pain somewhat.  The patient has a history of HTN.  BP is well-controlled today.  ROS: General: no fevers, chills, changes in weight, changes in appetite Skin: no rash HEENT: no blurry vision, hearing changes, sore throat Pulm: no dyspnea, coughing, wheezing CV: no chest pain, palpitations, shortness of breath Abd: no abdominal pain, nausea/vomiting, diarrhea/constipation GU: no dysuria, hematuria, polyuria Ext: no arthralgias, myalgias Neuro: +chronic back/leg pain  Filed Vitals:   06/26/13 1602  BP: 132/78  Pulse: 64  Temp:     PEX General: alert, cooperative, and in no apparent distress HEENT: pupils equal round and reactive to light, vision grossly intact, oropharynx clear and non-erythematous  Neck: supple Lungs: clear to ascultation bilaterally, normal work of respiration, no wheezes, rales, ronchi Heart: regular rate and rhythm, no murmurs, gallops, or rubs Abdomen: soft, non-tender, non-distended, normal bowel sounds Extremities: no cyanosis, clubbing, or edema.  Left stump unremarkable. Neurologic: alert & oriented X3, cranial nerves II-XII intact, strength grossly intact, sensation  intact to light touch  Current Outpatient Prescriptions on File Prior to Visit  Medication Sig Dispense Refill  . aspirin EC 81 MG tablet Take 1 tablet (81 mg total) by mouth daily.      . clonazePAM (KLONOPIN) 1 MG tablet Take 1 mg by mouth 3 (three) times daily as needed for anxiety.      . enalapril (VASOTEC) 20 MG tablet Take 20 mg by mouth daily.      Marland Kitchen gabapentin (NEURONTIN) 300 MG capsule TAKE ONE CAPSULE 3 TIMES A DAY  90 capsule  11  . hydrochlorothiazide (HYDRODIURIL) 25 MG tablet Take 1 tablet (25 mg total) by mouth daily.  30 tablet  11  . HYDROcodone-acetaminophen (NORCO) 10-325 MG per tablet Take 1 tablet by mouth every 4 (four) hours as needed.  150 tablet  0  . Multiple Vitamins-Minerals (MULTIVITAMIN WITH MINERALS) tablet Take 1 tablet by mouth daily.      Marland Kitchen omeprazole (PRILOSEC) 20 MG capsule Take 1 capsule (20 mg total) by mouth daily.  30 capsule  11  . sertraline (ZOLOFT) 100 MG tablet Take 1 tablet (100 mg total) by mouth daily.  30 tablet  11  . simvastatin (ZOCOR) 40 MG tablet Take 40 mg by mouth every evening.      . sucralfate (CARAFATE) 1 GM/10ML suspension Take 10 mLs (1 g total) by mouth 4 (four) times daily.  420 mL  0  . traMADol (ULTRAM) 50 MG tablet Take 50 mg by mouth every 6 (six) hours as needed for pain.      . Vitamins-Lipotropics (B COMPLEX FORMULA 1 PO) Take 1 tablet by mouth daily.  No current facility-administered medications on file prior to visit.    Assessment/Plan

## 2013-06-26 NOTE — Assessment & Plan Note (Signed)
Flu shot given today

## 2013-06-26 NOTE — Patient Instructions (Signed)
General Instructions: Quitting smoking is the best thing you can do for your health!  Try the prescription for the nicotine patch, 1 patch per day.  Please return for a follow-up visit in 3 months.   Treatment Goals:  Goals (1 Years of Data) as of 06/26/13     Lifestyle    . Prevent Falls       Progress Toward Treatment Goals:  Treatment Goal 06/26/2013  Blood pressure at goal  Stop smoking smoking the same amount  Prevent falls improved    Self Care Goals & Plans:  Self Care Goal 02/28/2013  Manage my medications take my medicines as prescribed; bring my medications to every visit; refill my medications on time  Eat healthy foods eat foods that are low in salt; drink diet soda or water instead of juice or soda  Be physically active (No Data)  Stop smoking call [facility] (1-800-QUIT-NOW)    No flowsheet data found.   Care Management & Community Referrals:  Referral 06/26/2013  Referrals made for care management support none needed

## 2013-06-27 ENCOUNTER — Ambulatory Visit: Payer: Medicare Other | Admitting: Physical Therapy

## 2013-06-27 NOTE — Progress Notes (Signed)
Case discussed with Dr. Brown at the time of the visit.  We reviewed the resident's history and exam and pertinent patient test results.  I agree with the assessment, diagnosis, and plan of care documented in the resident's note. 

## 2013-07-02 ENCOUNTER — Ambulatory Visit: Payer: Medicare Other | Admitting: Physical Therapy

## 2013-07-09 ENCOUNTER — Ambulatory Visit: Payer: Medicare Other | Attending: Vascular Surgery | Admitting: Physical Therapy

## 2013-07-09 DIAGNOSIS — R5381 Other malaise: Secondary | ICD-10-CM | POA: Insufficient documentation

## 2013-07-09 DIAGNOSIS — R269 Unspecified abnormalities of gait and mobility: Secondary | ICD-10-CM | POA: Insufficient documentation

## 2013-07-09 DIAGNOSIS — Z4789 Encounter for other orthopedic aftercare: Secondary | ICD-10-CM | POA: Insufficient documentation

## 2013-07-11 ENCOUNTER — Ambulatory Visit: Payer: Medicare Other | Admitting: Physical Therapy

## 2013-07-16 ENCOUNTER — Ambulatory Visit: Payer: Medicare Other | Admitting: Physical Therapy

## 2013-07-18 ENCOUNTER — Ambulatory Visit: Payer: Medicare Other | Admitting: Physical Therapy

## 2013-07-23 ENCOUNTER — Ambulatory Visit: Payer: Medicare Other | Admitting: Physical Therapy

## 2013-07-25 ENCOUNTER — Ambulatory Visit: Payer: Medicare Other | Admitting: Physical Therapy

## 2013-08-21 ENCOUNTER — Other Ambulatory Visit: Payer: Self-pay | Admitting: *Deleted

## 2013-08-21 MED ORDER — HYDROCODONE-ACETAMINOPHEN 10-325 MG PO TABS: 1.0000 | ORAL_TABLET | ORAL | Status: DC | PRN

## 2013-08-21 NOTE — Telephone Encounter (Signed)
This patient may pick up all 3 months at once.  Scripts left in overnight script box.

## 2013-08-21 NOTE — Telephone Encounter (Signed)
Pt would like to pick up 3 months of scripts- jan, feb, mar

## 2013-08-21 NOTE — Addendum Note (Signed)
Addended by: Hester Mates on: 08/21/2013 07:05 PM   Modules accepted: Orders, Medications

## 2013-08-22 NOTE — Telephone Encounter (Signed)
Pt informed Rx are ready for pickup

## 2013-09-23 ENCOUNTER — Other Ambulatory Visit: Payer: Self-pay | Admitting: Internal Medicine

## 2013-09-23 NOTE — Telephone Encounter (Signed)
I've phoned in to pharmacy

## 2013-09-25 ENCOUNTER — Other Ambulatory Visit: Payer: Self-pay | Admitting: *Deleted

## 2013-09-25 MED ORDER — TRAMADOL HCL 50 MG PO TABS: 50.0000 mg | ORAL_TABLET | Freq: Four times a day (QID) | ORAL | Status: DC | PRN

## 2013-09-26 ENCOUNTER — Telehealth: Payer: Self-pay | Admitting: *Deleted

## 2013-09-26 MED ORDER — TRAMADOL HCL 50 MG PO TABS: 50.0000 mg | ORAL_TABLET | Freq: Four times a day (QID) | ORAL | Status: DC | PRN

## 2013-09-26 NOTE — Telephone Encounter (Signed)
Tramadol rx called to [facility] pharmacy. 

## 2013-09-26 NOTE — Telephone Encounter (Signed)
Pt has called and is very upset about # of tramadol he was given this time, he states it should be #120 not #30. Please advise

## 2013-09-26 NOTE — Telephone Encounter (Signed)
Thank you for this note.  Per chart review, the patient has been getting #120/month previously.  While there's no set amount he's "supposed" to get, I think it's reasonable to continue #120/month for now, as it has allowed [place] to keep from escalating his hydrocodone usage.  I've written a prescription for an additional 90 tabs now, then another prescription for 120/month to be filled no earlier than 10/24/13.  Please phone these 2 prescription in to the pharmacy.  Thank you.

## 2013-09-27 NOTE — Telephone Encounter (Signed)
Rx called in and pt informed 

## 2013-11-11 ENCOUNTER — Other Ambulatory Visit: Payer: Self-pay | Admitting: Internal Medicine

## 2013-11-13 ENCOUNTER — Other Ambulatory Visit: Payer: Self-pay | Admitting: *Deleted

## 2013-11-15 MED ORDER — OMEPRAZOLE 20 MG PO CPDR: 20.0000 mg | DELAYED_RELEASE_CAPSULE | Freq: Every day | ORAL | Status: DC

## 2013-11-15 MED ORDER — SERTRALINE HCL 100 MG PO TABS: 100.0000 mg | ORAL_TABLET | Freq: Every day | ORAL | Status: DC

## 2013-11-15 MED ORDER — HYDROCHLOROTHIAZIDE 25 MG PO TABS: 25.0000 mg | ORAL_TABLET | Freq: Every day | ORAL | Status: DC

## 2013-11-15 MED ORDER — ENALAPRIL MALEATE 20 MG PO TABS: 20.0000 mg | ORAL_TABLET | Freq: Every day | ORAL | Status: DC

## 2013-11-19 ENCOUNTER — Other Ambulatory Visit: Payer: Self-pay | Admitting: *Deleted

## 2013-11-20 MED ORDER — HYDROCODONE-ACETAMINOPHEN 10-325 MG PO TABS: 1.0000 | ORAL_TABLET | ORAL | Status: DC | PRN

## 2013-11-20 NOTE — Telephone Encounter (Signed)
Ryan - I think you may still be in computer as on vacation.  This came to my box for refill on Norco.  Can you take care of it please? Thanks Dr Beryle Beams

## 2013-11-20 NOTE — Telephone Encounter (Signed)
Pt informed Rx are ready 

## 2014-02-11 ENCOUNTER — Other Ambulatory Visit: Payer: Self-pay | Admitting: Internal Medicine

## 2014-02-12 ENCOUNTER — Other Ambulatory Visit: Payer: Self-pay | Admitting: *Deleted

## 2014-02-12 MED ORDER — HYDROCODONE-ACETAMINOPHEN 10-325 MG PO TABS: 1.0000 | ORAL_TABLET | ORAL | Status: DC | PRN

## 2014-02-12 NOTE — Telephone Encounter (Signed)
Due on 7/16 Pt would like to pick up 7/15  Pt # [phone]

## 2014-02-12 NOTE — Telephone Encounter (Signed)
Pt informed Rx is ready 

## 2014-02-12 NOTE — Telephone Encounter (Signed)
Overdue for an appt. Pls sch PCP next 90 days

## 2014-02-12 NOTE — Telephone Encounter (Signed)
Message sent to front desk pool for appt. 

## 2014-03-14 ENCOUNTER — Other Ambulatory Visit: Payer: Self-pay | Admitting: *Deleted

## 2014-03-14 MED ORDER — CLONAZEPAM 1 MG PO TABS: 1.0000 mg | ORAL_TABLET | Freq: Three times a day (TID) | ORAL | Status: DC | PRN

## 2014-03-14 NOTE — Telephone Encounter (Signed)
Has appt 9/9 Dr Ethelene Hal. Will refill but needs to keep appt as last seen winter 2014  Pls phone in benzo

## 2014-03-14 NOTE — Telephone Encounter (Signed)
Called to pharm 

## 2014-03-20 ENCOUNTER — Other Ambulatory Visit: Payer: Self-pay | Admitting: *Deleted

## 2014-03-21 MED ORDER — HYDROCODONE-ACETAMINOPHEN 10-325 MG PO TABS: 1.0000 | ORAL_TABLET | ORAL | Status: DC | PRN

## 2014-04-16 ENCOUNTER — Ambulatory Visit (INDEPENDENT_AMBULATORY_CARE_PROVIDER_SITE_OTHER): Payer: Medicare Other | Admitting: Internal Medicine

## 2014-04-16 ENCOUNTER — Encounter: Payer: Self-pay | Admitting: Internal Medicine

## 2014-04-16 VITALS — BP 135/79 | HR 79 | Temp 97.4°F | Ht 71.0 in | Wt 155.4 lb

## 2014-04-16 DIAGNOSIS — F411 Generalized anxiety disorder: Secondary | ICD-10-CM

## 2014-04-16 DIAGNOSIS — Z0000 Encounter for general adult medical examination without abnormal findings: Secondary | ICD-10-CM

## 2014-04-16 DIAGNOSIS — F17200 Nicotine dependence, unspecified, uncomplicated: Secondary | ICD-10-CM

## 2014-04-16 DIAGNOSIS — M40299 Other kyphosis, site unspecified: Secondary | ICD-10-CM

## 2014-04-16 DIAGNOSIS — Z23 Encounter for immunization: Secondary | ICD-10-CM

## 2014-04-16 DIAGNOSIS — I10 Essential (primary) hypertension: Secondary | ICD-10-CM

## 2014-04-16 DIAGNOSIS — R269 Unspecified abnormalities of gait and mobility: Secondary | ICD-10-CM

## 2014-04-16 MED ORDER — HYDROCODONE-ACETAMINOPHEN 10-325 MG PO TABS: 1.0000 | ORAL_TABLET | ORAL | Status: DC | PRN

## 2014-04-16 MED ORDER — TRAMADOL HCL 50 MG PO TABS: 50.0000 mg | ORAL_TABLET | Freq: Four times a day (QID) | ORAL | Status: DC | PRN

## 2014-04-16 NOTE — Assessment & Plan Note (Signed)
Fall Screening 09/03/2012 10/17/2012 02/28/2013 06/26/2013 04/16/2014  Falls in the past year? No Yes Yes Yes Yes  Number of falls in past year - 2 or more 2 or more 2 or more 2 or more  Risk Factor Category  - High Fall Risk High Fall Risk High Fall Risk -      Assessment: Progress toward fall prevention goal:  deteriorated Comments: He had a fall two weeks ago as he lost his balance secondary to L BKA. He has done PT in the past but does not think it would provide additional benefit  Plan: Fall prevention plans: counseled on falls risks Educational resources provided:   Self management tools provided:

## 2014-04-16 NOTE — Progress Notes (Signed)
I saw and evaluated the patient. I personally confirmed the key portions of Dr. Rothman's history and exam and reviewed pertinent patient test results. The assessment, diagnosis, and plan were formulated together and I agree with the documentation in the resident's note. 

## 2014-04-16 NOTE — Assessment & Plan Note (Signed)
BP Readings from Last 3 Encounters:  04/16/14 135/79  06/26/13 132/78  05/23/13 109/88    Lab Results  Component Value Date   NA 136 05/23/2013   K 3.2* 05/23/2013   CREATININE 1.00 05/23/2013    Assessment: Blood pressure control: controlled Progress toward BP goal:  at goal Comments: Initially 151/81 but then came down to 135/79 after recheck  Plan: Medications:  continue current medications Educational resources provided:   Self management tools provided:   Other plans: encouraged smoking cessation

## 2014-04-16 NOTE — Patient Instructions (Signed)
It was a pleasure to meet you today. You received your flu shot and we discussed a colonoscopy referral after 08/08/14. Your blood pressure was well controlled and we discussed pain management. Please seek medical attention or return to the clinic if you have any new or worsening back or leg pain, numbness, weakness, or other worrisome medical condition. I look forward to seeing you again in 6 months

## 2014-04-16 NOTE — Assessment & Plan Note (Signed)
Patient reports that chronic pain is unchanged. He has a pain contract. He finished his norco and tramadol 5 days early as he took more after his fall 2 weeks ago -refilled norco and tramadol x 3 months with renewal date moved up to the 9th of month -discussed terms of pain contract

## 2014-04-16 NOTE — Progress Notes (Signed)
   Subjective:    Patient ID: Andrew [name], male    DOB: 1955/12/01, 58 y.o.   MRN: [id number]  Back Pain Pertinent negatives include no abdominal pain, chest pain, fever, headaches, numbness or weakness.    Mr Bohanon is a 58 year old man with PVD s/p L BKA, HTN, and chronic pain who presents for routine follow-up. He fell about two weeks ago because he lost his balance with no noted trauma. He is requesting a prescription refill on norco and tramadol as it is not due for refill for 5 days and he is out after taking extra from his fall. Otherwise no complaints.  Review of Systems  Constitutional: Negative for fever, chills and diaphoresis.  Respiratory: Negative for shortness of breath.   Cardiovascular: Negative for chest pain and palpitations.  Gastrointestinal: Negative for nausea, vomiting, abdominal pain, diarrhea and constipation.  Musculoskeletal: Positive for back pain.       L LE phantom leg pain  Neurological: Negative for dizziness, weakness, numbness and headaches.       Objective:   Physical Exam  Vitals reviewed. Constitutional: He is oriented to person, place, and time. He appears well-developed and well-nourished. No distress.  HENT:  Head: Normocephalic and atraumatic.  Mouth/Throat: Oropharynx is clear and moist.  Eyes: EOM are normal.  Cardiovascular: Normal rate, regular rhythm, normal heart sounds and intact distal pulses.  Exam reveals no gallop and no friction rub.   No murmur heard. Pulmonary/Chest: Effort normal and breath sounds normal. No respiratory distress.  Abdominal: Soft. Bowel sounds are normal. He exhibits no distension. There is no tenderness.  Musculoskeletal: He exhibits no edema.  L BKA with prosthesis  Neurological: He is alert and oriented to person, place, and time.  Skin: He is not diaphoretic.          Assessment & Plan:

## 2014-04-16 NOTE — Assessment & Plan Note (Signed)
  Assessment: Progress toward smoking cessation:  smoking the same amount Barriers to progress toward smoking cessation:    Comments: Patient has Rx for nicotine patch but says he thinks it is too expensive to fill and is not that interested  Plan: Instruction/counseling given:  I counseled patient on the dangers of tobacco use, advised patient to stop smoking, and reviewed strategies to maximize success. Educational resources provided:    Self management tools provided:    Medications to assist with smoking cessation:  Nicotine Patch Patient agreed to the following self-care plans for smoking cessation:    Other plans: Encouraged patient to fill nicotine patch Rx

## 2014-04-16 NOTE — Assessment & Plan Note (Signed)
Patient says he uses clonazepam 1 mg during day and 2 mg at night. He asked if he can have more. -discussed terms of medication contract -discussed his fall risk and that he should not be taking any additional benzodiazepines

## 2014-04-16 NOTE — Assessment & Plan Note (Signed)
Patient received flu shot today. He would like colonoscopy referral after 08/08/14

## 2014-04-18 ENCOUNTER — Other Ambulatory Visit: Payer: Self-pay | Admitting: *Deleted

## 2014-04-22 MED ORDER — ENALAPRIL MALEATE 20 MG PO TABS: 20.0000 mg | ORAL_TABLET | Freq: Every day | ORAL | Status: DC

## 2014-05-16 ENCOUNTER — Other Ambulatory Visit: Payer: Self-pay | Admitting: Internal Medicine

## 2014-05-21 ENCOUNTER — Encounter: Payer: Self-pay | Admitting: Internal Medicine

## 2014-05-22 ENCOUNTER — Encounter: Payer: Self-pay | Admitting: Internal Medicine

## 2014-06-24 ENCOUNTER — Other Ambulatory Visit: Payer: Self-pay | Admitting: Internal Medicine

## 2014-06-24 DIAGNOSIS — G8929 Other chronic pain: Secondary | ICD-10-CM

## 2014-06-24 DIAGNOSIS — M549 Dorsalgia, unspecified: Principal | ICD-10-CM

## 2014-06-24 MED ORDER — HYDROCODONE-ACETAMINOPHEN 10-325 MG PO TABS
1.0000 | ORAL_TABLET | ORAL | Status: DC | PRN
Start: 2014-06-24 — End: 2014-10-14

## 2014-06-24 MED ORDER — HYDROCODONE-ACETAMINOPHEN 10-325 MG PO TABS: 1.0000 | ORAL_TABLET | ORAL | Status: DC | PRN

## 2014-07-09 ENCOUNTER — Other Ambulatory Visit: Payer: Self-pay | Admitting: *Deleted

## 2014-07-09 MED ORDER — GABAPENTIN 300 MG PO CAPS: ORAL_CAPSULE | ORAL | Status: DC

## 2014-07-12 ENCOUNTER — Other Ambulatory Visit: Payer: Self-pay | Admitting: Internal Medicine

## 2014-07-14 ENCOUNTER — Encounter: Payer: Self-pay | Admitting: *Deleted

## 2014-07-14 ENCOUNTER — Other Ambulatory Visit: Payer: Self-pay | Admitting: *Deleted

## 2014-07-14 NOTE — Telephone Encounter (Signed)
He would like 3 scripts

## 2014-07-15 NOTE — Telephone Encounter (Signed)
Refills are done from last month

## 2014-07-17 ENCOUNTER — Encounter (HOSPITAL_COMMUNITY): Payer: Self-pay | Admitting: Vascular Surgery

## 2014-07-21 ENCOUNTER — Other Ambulatory Visit: Payer: Self-pay | Admitting: *Deleted

## 2014-07-22 ENCOUNTER — Other Ambulatory Visit: Payer: Self-pay | Admitting: Internal Medicine

## 2014-07-22 MED ORDER — TRAMADOL HCL 50 MG PO TABS: 50.0000 mg | ORAL_TABLET | Freq: Four times a day (QID) | ORAL | Status: DC | PRN

## 2014-07-23 NOTE — Telephone Encounter (Signed)
Rx called into pharmacy, message left on pt's recorder.Despina Hidden Cassady12/16/20158:35 AM

## 2014-07-25 ENCOUNTER — Other Ambulatory Visit: Payer: Self-pay | Admitting: Oncology

## 2014-07-25 NOTE — Telephone Encounter (Signed)
Rx called in to pharmacy. 

## 2014-09-04 ENCOUNTER — Other Ambulatory Visit: Payer: Self-pay | Admitting: *Deleted

## 2014-09-04 MED ORDER — CLONAZEPAM 1 MG PO TABS: 1.0000 mg | ORAL_TABLET | Freq: Three times a day (TID) | ORAL | Status: DC | PRN

## 2014-09-08 ENCOUNTER — Other Ambulatory Visit: Payer: Self-pay | Admitting: Internal Medicine

## 2014-09-08 NOTE — Telephone Encounter (Signed)
This has been called in

## 2014-09-25 ENCOUNTER — Encounter: Payer: Self-pay | Admitting: Internal Medicine

## 2014-09-29 ENCOUNTER — Encounter: Payer: Medicare Other | Admitting: Internal Medicine

## 2014-10-01 ENCOUNTER — Encounter: Payer: Medicare Other | Admitting: Internal Medicine

## 2014-10-07 ENCOUNTER — Other Ambulatory Visit: Payer: Self-pay | Admitting: Internal Medicine

## 2014-10-07 ENCOUNTER — Other Ambulatory Visit: Payer: Self-pay | Admitting: Oncology

## 2014-10-07 NOTE — Telephone Encounter (Signed)
Pt has been dismissed from [facility]. Refill is medically necessary so will refill as in 30 days of dismissal. He was sent letter 2/18 to find new PCP. No more refills on this med.

## 2014-10-07 NOTE — Telephone Encounter (Signed)
Called to pharm 

## 2014-10-14 ENCOUNTER — Other Ambulatory Visit: Payer: Self-pay | Admitting: *Deleted

## 2014-10-14 MED ORDER — HYDROCODONE-ACETAMINOPHEN 10-325 MG PO TABS: 1.0000 | ORAL_TABLET | ORAL | Status: DC | PRN

## 2014-10-14 NOTE — Telephone Encounter (Signed)
Pt called for refill.  Last filled  09/16/14  # 150 Dr. Ethelene Hal.

## 2014-10-14 NOTE — Telephone Encounter (Signed)
Pt fills on the 9th of the month. Was dismissed on Feb 18th. Needs 10 day supply. Therefore, I filled for 1/3 of his regular Rx - 50 pills.

## 2014-10-20 ENCOUNTER — Other Ambulatory Visit: Payer: Self-pay | Admitting: Internal Medicine

## 2014-10-21 ENCOUNTER — Other Ambulatory Visit: Payer: Self-pay | Admitting: *Deleted

## 2014-10-21 ENCOUNTER — Other Ambulatory Visit: Payer: Self-pay | Admitting: Internal Medicine

## 2014-10-29 NOTE — Telephone Encounter (Signed)
Pt is no longer an [facility] pt - dismissed in Feb.

## 2014-11-14 ENCOUNTER — Telehealth: Payer: Self-pay | Admitting: *Deleted

## 2014-11-14 NOTE — Telephone Encounter (Signed)
I met with Andrew [name] for a significant amount of time when he came to the clinic very tearful that he was dismissed. He had spoken to Andrew [name] and Andrew [name] prior. Andrew [name] was very apologetic however I did confirm with him that he would continue to be dismissed from the clinic based on the letter dated 2.18.16. I gave him alternative doctors to locate including [facility]. He told me he could not get the phone answered. I suggested he drive in as they take walk ins.

## 2016-06-15 ENCOUNTER — Inpatient Hospital Stay (HOSPITAL_COMMUNITY)
Admission: EM | Admit: 2016-06-15 | Discharge: 2016-06-18 | DRG: 897 | Disposition: A | Discharge status: Home / Self Care | Payer: Medicare Other | Attending: Internal Medicine | Admitting: Internal Medicine

## 2016-06-15 ENCOUNTER — Emergency Department (HOSPITAL_COMMUNITY): Payer: Medicare Other

## 2016-06-15 ENCOUNTER — Encounter (HOSPITAL_COMMUNITY): Payer: Self-pay

## 2016-06-15 DIAGNOSIS — M40204 Unspecified kyphosis, thoracic region: Secondary | ICD-10-CM | POA: Diagnosis present

## 2016-06-15 DIAGNOSIS — Y902 Blood alcohol level of 40-59 mg/100 ml: Secondary | ICD-10-CM | POA: Diagnosis present

## 2016-06-15 DIAGNOSIS — F10239 Alcohol dependence with withdrawal, unspecified: Principal | ICD-10-CM | POA: Diagnosis present

## 2016-06-15 DIAGNOSIS — R05 Cough: Secondary | ICD-10-CM

## 2016-06-15 DIAGNOSIS — E8729 Other acidosis: Secondary | ICD-10-CM

## 2016-06-15 DIAGNOSIS — N289 Disorder of kidney and ureter, unspecified: Secondary | ICD-10-CM | POA: Diagnosis present

## 2016-06-15 DIAGNOSIS — M546 Pain in thoracic spine: Secondary | ICD-10-CM | POA: Diagnosis present

## 2016-06-15 DIAGNOSIS — Z8249 Family history of ischemic heart disease and other diseases of the circulatory system: Secondary | ICD-10-CM

## 2016-06-15 DIAGNOSIS — Z85819 Personal history of malignant neoplasm of unspecified site of lip, oral cavity, and pharynx: Secondary | ICD-10-CM

## 2016-06-15 DIAGNOSIS — N19 Unspecified kidney failure: Secondary | ICD-10-CM

## 2016-06-15 DIAGNOSIS — R740 Nonspecific elevation of levels of transaminase and lactic acid dehydrogenase [LDH]: Secondary | ICD-10-CM

## 2016-06-15 DIAGNOSIS — Z89512 Acquired absence of left leg below knee: Secondary | ICD-10-CM

## 2016-06-15 DIAGNOSIS — R251 Tremor, unspecified: Secondary | ICD-10-CM | POA: Diagnosis not present

## 2016-06-15 DIAGNOSIS — F419 Anxiety disorder, unspecified: Secondary | ICD-10-CM | POA: Diagnosis present

## 2016-06-15 DIAGNOSIS — N200 Calculus of kidney: Secondary | ICD-10-CM | POA: Diagnosis present

## 2016-06-15 DIAGNOSIS — F1020 Alcohol dependence, uncomplicated: Secondary | ICD-10-CM | POA: Diagnosis present

## 2016-06-15 DIAGNOSIS — E1151 Type 2 diabetes mellitus with diabetic peripheral angiopathy without gangrene: Secondary | ICD-10-CM | POA: Diagnosis present

## 2016-06-15 DIAGNOSIS — R7401 Elevation of levels of liver transaminase levels: Secondary | ICD-10-CM

## 2016-06-15 DIAGNOSIS — G8929 Other chronic pain: Secondary | ICD-10-CM | POA: Diagnosis present

## 2016-06-15 DIAGNOSIS — Z89519 Acquired absence of unspecified leg below knee: Secondary | ICD-10-CM

## 2016-06-15 DIAGNOSIS — E860 Dehydration: Secondary | ICD-10-CM | POA: Diagnosis present

## 2016-06-15 DIAGNOSIS — Z79899 Other long term (current) drug therapy: Secondary | ICD-10-CM

## 2016-06-15 DIAGNOSIS — R059 Cough, unspecified: Secondary | ICD-10-CM

## 2016-06-15 DIAGNOSIS — E785 Hyperlipidemia, unspecified: Secondary | ICD-10-CM | POA: Diagnosis present

## 2016-06-15 DIAGNOSIS — D72829 Elevated white blood cell count, unspecified: Secondary | ICD-10-CM | POA: Diagnosis present

## 2016-06-15 DIAGNOSIS — Z7982 Long term (current) use of aspirin: Secondary | ICD-10-CM

## 2016-06-15 DIAGNOSIS — F17210 Nicotine dependence, cigarettes, uncomplicated: Secondary | ICD-10-CM | POA: Diagnosis present

## 2016-06-15 DIAGNOSIS — F10939 Alcohol use, unspecified with withdrawal, unspecified: Secondary | ICD-10-CM | POA: Diagnosis present

## 2016-06-15 DIAGNOSIS — F418 Other specified anxiety disorders: Secondary | ICD-10-CM | POA: Diagnosis present

## 2016-06-15 DIAGNOSIS — E872 Acidosis: Secondary | ICD-10-CM | POA: Diagnosis present

## 2016-06-15 DIAGNOSIS — Z823 Family history of stroke: Secondary | ICD-10-CM

## 2016-06-15 DIAGNOSIS — M545 Low back pain: Secondary | ICD-10-CM | POA: Diagnosis present

## 2016-06-15 DIAGNOSIS — I10 Essential (primary) hypertension: Secondary | ICD-10-CM | POA: Diagnosis present

## 2016-06-15 DIAGNOSIS — N179 Acute kidney failure, unspecified: Secondary | ICD-10-CM

## 2016-06-15 DIAGNOSIS — Z811 Family history of alcohol abuse and dependence: Secondary | ICD-10-CM

## 2016-06-15 DIAGNOSIS — Z9114 Patient's other noncompliance with medication regimen: Secondary | ICD-10-CM

## 2016-06-15 DIAGNOSIS — F329 Major depressive disorder, single episode, unspecified: Secondary | ICD-10-CM | POA: Diagnosis present

## 2016-06-15 DIAGNOSIS — M48061 Spinal stenosis, lumbar region without neurogenic claudication: Secondary | ICD-10-CM | POA: Diagnosis present

## 2016-06-15 DIAGNOSIS — J449 Chronic obstructive pulmonary disease, unspecified: Secondary | ICD-10-CM | POA: Diagnosis present

## 2016-06-15 DIAGNOSIS — K219 Gastro-esophageal reflux disease without esophagitis: Secondary | ICD-10-CM | POA: Diagnosis present

## 2016-06-15 MED ORDER — LORAZEPAM 2 MG/ML IJ SOLN
0.0000 mg | Freq: Four times a day (QID) | INTRAMUSCULAR | Status: DC
  Administered 2016-06-15: 1 mg via INTRAVENOUS
  Filled 2016-06-15: qty 1

## 2016-06-15 MED ORDER — THIAMINE HCL 100 MG/ML IJ SOLN: 100.0000 mg | Freq: Every day | INTRAMUSCULAR | Status: DC

## 2016-06-15 MED ORDER — VITAMIN B-1 100 MG PO TABS: 100.0000 mg | ORAL_TABLET | Freq: Every day | ORAL | Status: DC

## 2016-06-15 MED ORDER — LORAZEPAM 2 MG/ML IJ SOLN: 0.0000 mg | Freq: Two times a day (BID) | INTRAMUSCULAR | Status: DC
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
## 2016-06-15 NOTE — ED Triage Notes (Signed)
Pt brought in by [facility] for alcohol withdrawal symptoms. Ambulance called by pts brother. Pt is alert and oriented, states last drink was about 24 hours ago. Pt has tremors and is nauseated. Received 4mg  of zofran on transport an 400cc of Ns. Denies chest pain and hx of seizures.

## 2016-06-16 ENCOUNTER — Encounter (HOSPITAL_COMMUNITY): Payer: Self-pay | Admitting: Family Medicine

## 2016-06-16 ENCOUNTER — Inpatient Hospital Stay (HOSPITAL_COMMUNITY): Payer: Medicare Other

## 2016-06-16 DIAGNOSIS — N200 Calculus of kidney: Secondary | ICD-10-CM | POA: Diagnosis present

## 2016-06-16 DIAGNOSIS — F419 Anxiety disorder, unspecified: Secondary | ICD-10-CM | POA: Diagnosis present

## 2016-06-16 DIAGNOSIS — F1020 Alcohol dependence, uncomplicated: Secondary | ICD-10-CM | POA: Diagnosis not present

## 2016-06-16 DIAGNOSIS — F418 Other specified anxiety disorders: Secondary | ICD-10-CM

## 2016-06-16 DIAGNOSIS — Z89512 Acquired absence of left leg below knee: Secondary | ICD-10-CM | POA: Diagnosis not present

## 2016-06-16 DIAGNOSIS — N289 Disorder of kidney and ureter, unspecified: Secondary | ICD-10-CM | POA: Diagnosis present

## 2016-06-16 DIAGNOSIS — F10230 Alcohol dependence with withdrawal, uncomplicated: Secondary | ICD-10-CM

## 2016-06-16 DIAGNOSIS — G8929 Other chronic pain: Secondary | ICD-10-CM | POA: Diagnosis present

## 2016-06-16 DIAGNOSIS — E872 Acidosis: Secondary | ICD-10-CM | POA: Diagnosis present

## 2016-06-16 DIAGNOSIS — R251 Tremor, unspecified: Secondary | ICD-10-CM | POA: Diagnosis present

## 2016-06-16 DIAGNOSIS — E785 Hyperlipidemia, unspecified: Secondary | ICD-10-CM | POA: Diagnosis present

## 2016-06-16 DIAGNOSIS — Y902 Blood alcohol level of 40-59 mg/100 ml: Secondary | ICD-10-CM | POA: Diagnosis present

## 2016-06-16 DIAGNOSIS — K219 Gastro-esophageal reflux disease without esophagitis: Secondary | ICD-10-CM | POA: Diagnosis present

## 2016-06-16 DIAGNOSIS — D72829 Elevated white blood cell count, unspecified: Secondary | ICD-10-CM | POA: Diagnosis present

## 2016-06-16 DIAGNOSIS — F10939 Alcohol use, unspecified with withdrawal, unspecified: Secondary | ICD-10-CM | POA: Diagnosis present

## 2016-06-16 DIAGNOSIS — I10 Essential (primary) hypertension: Secondary | ICD-10-CM

## 2016-06-16 DIAGNOSIS — Z9114 Patient's other noncompliance with medication regimen: Secondary | ICD-10-CM | POA: Diagnosis not present

## 2016-06-16 DIAGNOSIS — M546 Pain in thoracic spine: Secondary | ICD-10-CM | POA: Diagnosis present

## 2016-06-16 DIAGNOSIS — E1151 Type 2 diabetes mellitus with diabetic peripheral angiopathy without gangrene: Secondary | ICD-10-CM | POA: Diagnosis present

## 2016-06-16 DIAGNOSIS — Z85819 Personal history of malignant neoplasm of unspecified site of lip, oral cavity, and pharynx: Secondary | ICD-10-CM | POA: Diagnosis not present

## 2016-06-16 DIAGNOSIS — M545 Low back pain: Secondary | ICD-10-CM | POA: Diagnosis present

## 2016-06-16 DIAGNOSIS — F10239 Alcohol dependence with withdrawal, unspecified: Secondary | ICD-10-CM | POA: Diagnosis not present

## 2016-06-16 DIAGNOSIS — E860 Dehydration: Secondary | ICD-10-CM | POA: Diagnosis present

## 2016-06-16 DIAGNOSIS — N179 Acute kidney failure, unspecified: Secondary | ICD-10-CM

## 2016-06-16 DIAGNOSIS — F329 Major depressive disorder, single episode, unspecified: Secondary | ICD-10-CM | POA: Diagnosis present

## 2016-06-16 DIAGNOSIS — E8729 Other acidosis: Secondary | ICD-10-CM | POA: Diagnosis present

## 2016-06-16 DIAGNOSIS — M48061 Spinal stenosis, lumbar region without neurogenic claudication: Secondary | ICD-10-CM | POA: Diagnosis present

## 2016-06-16 DIAGNOSIS — F17210 Nicotine dependence, cigarettes, uncomplicated: Secondary | ICD-10-CM | POA: Diagnosis present

## 2016-06-16 DIAGNOSIS — J449 Chronic obstructive pulmonary disease, unspecified: Secondary | ICD-10-CM | POA: Diagnosis present

## 2016-06-16 DIAGNOSIS — M40204 Unspecified kyphosis, thoracic region: Secondary | ICD-10-CM | POA: Diagnosis present

## 2016-06-16 DIAGNOSIS — Z811 Family history of alcohol abuse and dependence: Secondary | ICD-10-CM | POA: Diagnosis not present

## 2016-06-16 DIAGNOSIS — F10231 Alcohol dependence with withdrawal delirium: Secondary | ICD-10-CM | POA: Diagnosis not present

## 2016-06-16 LAB — RAPID URINE DRUG SCREEN, HOSP PERFORMED
AMPHETAMINES: NOT DETECTED
BARBITURATES: NOT DETECTED
Benzodiazepines: NOT DETECTED
Cocaine: NOT DETECTED
Opiates: NOT DETECTED
TETRAHYDROCANNABINOL: NOT DETECTED

## 2016-06-16 LAB — CBC WITH DIFFERENTIAL/PLATELET
Basophils Absolute: 0 10*3/uL (ref 0.0–0.1)
Basophils Relative: 0 %
EOS PCT: 0 %
Eosinophils Absolute: 0 10*3/uL (ref 0.0–0.7)
HEMATOCRIT: 43.5 % (ref 39.0–52.0)
Hemoglobin: 15.2 g/dL (ref 13.0–17.0)
LYMPHS ABS: 1.1 10*3/uL (ref 0.7–4.0)
LYMPHS PCT: 7 %
MCH: 30.6 pg (ref 26.0–34.0)
MCHC: 34.9 g/dL (ref 30.0–36.0)
MCV: 87.7 fL (ref 78.0–100.0)
Monocytes Absolute: 1.5 10*3/uL — ABNORMAL HIGH (ref 0.1–1.0)
Monocytes Relative: 9 %
NEUTROS ABS: 13.4 10*3/uL — AB (ref 1.7–7.7)
Neutrophils Relative %: 84 %
PLATELETS: 351 10*3/uL (ref 150–400)
RBC: 4.96 MIL/uL (ref 4.22–5.81)
RDW: 14.2 % (ref 11.5–15.5)
WBC: 16 10*3/uL — AB (ref 4.0–10.5)

## 2016-06-16 LAB — CBC
HCT: 36.4 % — ABNORMAL LOW (ref 39.0–52.0)
Hemoglobin: 12.6 g/dL — ABNORMAL LOW (ref 13.0–17.0)
MCH: 29.8 pg (ref 26.0–34.0)
MCHC: 34.6 g/dL (ref 30.0–36.0)
MCV: 86.1 fL (ref 78.0–100.0)
Platelets: 271 10*3/uL (ref 150–400)
RBC: 4.23 MIL/uL (ref 4.22–5.81)
RDW: 13.7 % (ref 11.5–15.5)
WBC: 12.2 10*3/uL — ABNORMAL HIGH (ref 4.0–10.5)

## 2016-06-16 LAB — BASIC METABOLIC PANEL
Anion gap: 18 — ABNORMAL HIGH (ref 5–15)
BUN: 23 mg/dL — ABNORMAL HIGH (ref 6–20)
CO2: 18 mmol/L — ABNORMAL LOW (ref 22–32)
Calcium: 8 mg/dL — ABNORMAL LOW (ref 8.9–10.3)
Chloride: 95 mmol/L — ABNORMAL LOW (ref 101–111)
Creatinine, Ser: 1.55 mg/dL — ABNORMAL HIGH (ref 0.61–1.24)
GFR calc Af Amer: 55 mL/min — ABNORMAL LOW (ref >60)
GFR calc non Af Amer: 48 mL/min — ABNORMAL LOW (ref >60)
Glucose, Bld: 87 mg/dL (ref 65–99)
Potassium: 3.8 mmol/L (ref 3.5–5.1)
Sodium: 131 mmol/L — ABNORMAL LOW (ref 135–145)

## 2016-06-16 LAB — COMPREHENSIVE METABOLIC PANEL
ALT: 26 U/L (ref 17–63)
AST: 55 U/L — AB (ref 15–41)
Albumin: 4.9 g/dL (ref 3.5–5.0)
Alkaline Phosphatase: 73 U/L (ref 38–126)
Anion gap: 35 — ABNORMAL HIGH (ref 5–15)
BUN: 27 mg/dL — AB (ref 6–20)
CHLORIDE: 94 mmol/L — AB (ref 101–111)
CO2: 9 mmol/L — AB (ref 22–32)
CREATININE: 1.72 mg/dL — AB (ref 0.61–1.24)
Calcium: 9.8 mg/dL (ref 8.9–10.3)
GFR calc non Af Amer: 42 mL/min — ABNORMAL LOW (ref >60)
GFR, EST AFRICAN AMERICAN: 49 mL/min — AB (ref >60)
Glucose, Bld: 95 mg/dL (ref 65–99)
Potassium: 3.5 mmol/L (ref 3.5–5.1)
SODIUM: 138 mmol/L (ref 135–145)
Total Bilirubin: 1 mg/dL (ref 0.3–1.2)
Total Protein: 7.9 g/dL (ref 6.5–8.1)

## 2016-06-16 LAB — URINALYSIS, ROUTINE W REFLEX MICROSCOPIC
BILIRUBIN URINE: NEGATIVE
Glucose, UA: 100 mg/dL — AB
HGB URINE DIPSTICK: NEGATIVE
Leukocytes, UA: NEGATIVE
NITRITE: NEGATIVE
PROTEIN: NEGATIVE mg/dL
SPECIFIC GRAVITY, URINE: 1.014 (ref 1.005–1.030)
pH: 6 (ref 5.0–8.0)

## 2016-06-16 LAB — GLUCOSE, CAPILLARY
Glucose-Capillary: 180 mg/dL — ABNORMAL HIGH (ref 65–99)
Glucose-Capillary: 154 mg/dL — ABNORMAL HIGH (ref 65–99)
Glucose-Capillary: 168 mg/dL — ABNORMAL HIGH (ref 65–99)

## 2016-06-16 LAB — ETHANOL: Alcohol, Ethyl (B): 46 mg/dL — ABNORMAL HIGH (ref <5)

## 2016-06-16 LAB — TROPONIN I

## 2016-06-16 LAB — CREATININE, URINE, RANDOM: Creatinine, Urine: 65.19 mg/dL

## 2016-06-16 LAB — SODIUM, URINE, RANDOM: Sodium, Ur: 33 mmol/L

## 2016-06-16 LAB — LACTIC ACID, PLASMA
Lactic Acid, Venous: 3.5 mmol/L (ref 0.5–1.9)
Lactic Acid, Venous: 1.2 mmol/L (ref 0.5–1.9)

## 2016-06-16 LAB — MAGNESIUM: MAGNESIUM: 1.4 mg/dL — AB (ref 1.7–2.4)

## 2016-06-16 LAB — MRSA PCR SCREENING: MRSA BY PCR: NEGATIVE

## 2016-06-16 MED ORDER — FENOFIBRATE 54 MG PO TABS
54.0000 mg | ORAL_TABLET | Freq: Every day | ORAL | Status: DC
  Administered 2016-06-16 – 2016-06-18 (×3): 54 mg via ORAL
  Filled 2016-06-16 (×3): qty 1

## 2016-06-16 MED ORDER — ONDANSETRON HCL 4 MG/2ML IJ SOLN
4.0000 mg | Freq: Four times a day (QID) | INTRAMUSCULAR | Status: DC | PRN
  Administered 2016-06-16 (×2): 4 mg via INTRAVENOUS
  Filled 2016-06-16 (×2): qty 2

## 2016-06-16 MED ORDER — SODIUM CHLORIDE 0.9 % IV SOLN: 1000.0000 mL | INTRAVENOUS | Status: DC

## 2016-06-16 MED ORDER — THIAMINE HCL 100 MG/ML IJ SOLN
100.0000 mg | Freq: Every day | INTRAMUSCULAR | Status: DC
  Administered 2016-06-17: 100 mg via INTRAVENOUS
  Filled 2016-06-16: qty 2

## 2016-06-16 MED ORDER — HYDRALAZINE HCL 20 MG/ML IJ SOLN: 10.0000 mg | INTRAMUSCULAR | Status: DC | PRN

## 2016-06-16 MED ORDER — ADULT MULTIVITAMIN W/MINERALS CH
1.0000 | ORAL_TABLET | Freq: Every day | ORAL | Status: DC
  Administered 2016-06-17 – 2016-06-18 (×2): 1 via ORAL
  Filled 2016-06-16 (×2): qty 1

## 2016-06-16 MED ORDER — LORAZEPAM 2 MG/ML IJ SOLN
1.0000 mg | Freq: Once | INTRAMUSCULAR | Status: AC
  Administered 2016-06-16: 1 mg via INTRAVENOUS
  Filled 2016-06-16: qty 1

## 2016-06-16 MED ORDER — LORAZEPAM 2 MG/ML IJ SOLN: 2.0000 mg | INTRAMUSCULAR | Status: DC | PRN

## 2016-06-16 MED ORDER — M.V.I. ADULT IV INJ
INJECTION | Freq: Once | INTRAVENOUS | Status: AC
  Administered 2016-06-16: 03:00:00 via INTRAVENOUS
  Filled 2016-06-16: qty 1000

## 2016-06-16 MED ORDER — GABAPENTIN 300 MG PO CAPS
300.0000 mg | ORAL_CAPSULE | Freq: Three times a day (TID) | ORAL | Status: DC
  Administered 2016-06-16 – 2016-06-18 (×7): 300 mg via ORAL
  Filled 2016-06-16 (×7): qty 1

## 2016-06-16 MED ORDER — SODIUM CHLORIDE 0.9% FLUSH
3.0000 mL | Freq: Two times a day (BID) | INTRAVENOUS | Status: DC
  Administered 2016-06-16 – 2016-06-17 (×4): 3 mL via INTRAVENOUS

## 2016-06-16 MED ORDER — ASPIRIN EC 81 MG PO TBEC: 81.0000 mg | DELAYED_RELEASE_TABLET | Freq: Every day | ORAL | Status: DC

## 2016-06-16 MED ORDER — ONDANSETRON HCL 4 MG PO TABS: 4.0000 mg | ORAL_TABLET | Freq: Four times a day (QID) | ORAL | Status: DC | PRN

## 2016-06-16 MED ORDER — SIMVASTATIN 40 MG PO TABS
40.0000 mg | ORAL_TABLET | Freq: Every day | ORAL | Status: DC
  Administered 2016-06-16 – 2016-06-17 (×3): 40 mg via ORAL
  Filled 2016-06-16 (×3): qty 1

## 2016-06-16 MED ORDER — NICOTINE 21 MG/24HR TD PT24: 21.0000 mg | MEDICATED_PATCH | Freq: Every day | TRANSDERMAL | Status: DC | PRN

## 2016-06-16 MED ORDER — ENSURE ENLIVE PO LIQD: 237.0000 mL | Freq: Two times a day (BID) | ORAL | Status: DC

## 2016-06-16 MED ORDER — CARVEDILOL 6.25 MG PO TABS
6.2500 mg | ORAL_TABLET | Freq: Two times a day (BID) | ORAL | Status: DC
  Administered 2016-06-16 – 2016-06-18 (×5): 6.25 mg via ORAL
  Filled 2016-06-16 (×5): qty 1

## 2016-06-16 MED ORDER — HYDROCODONE-ACETAMINOPHEN 5-325 MG PO TABS: 1.0000 | ORAL_TABLET | ORAL | Status: DC | PRN

## 2016-06-16 MED ORDER — MAGNESIUM OXIDE 400 (241.3 MG) MG PO TABS
800.0000 mg | ORAL_TABLET | Freq: Once | ORAL | Status: AC
Start: 2016-06-16 — End: 2016-06-16
  Administered 2016-06-16: 800 mg via ORAL
  Filled 2016-06-16: qty 2

## 2016-06-16 MED ORDER — PANTOPRAZOLE SODIUM 40 MG PO TBEC
40.0000 mg | DELAYED_RELEASE_TABLET | Freq: Every day | ORAL | Status: DC
  Administered 2016-06-16 – 2016-06-17 (×2): 40 mg via ORAL
  Filled 2016-06-16 (×2): qty 1

## 2016-06-16 MED ORDER — FOLIC ACID 5 MG/ML IJ SOLN
1.0000 mg | Freq: Every day | INTRAMUSCULAR | Status: DC
  Administered 2016-06-17: 1 mg via INTRAVENOUS
  Filled 2016-06-16: qty 0.2

## 2016-06-16 MED ORDER — SODIUM CHLORIDE 0.9 % IV SOLN
1000.0000 mL | Freq: Once | INTRAVENOUS | Status: AC
  Administered 2016-06-16: 1000 mL via INTRAVENOUS

## 2016-06-16 MED ORDER — ACETAMINOPHEN 650 MG RE SUPP: 650.0000 mg | Freq: Four times a day (QID) | RECTAL | Status: DC | PRN

## 2016-06-16 MED ORDER — SERTRALINE HCL 100 MG PO TABS
100.0000 mg | ORAL_TABLET | Freq: Every day | ORAL | Status: DC
  Administered 2016-06-16 – 2016-06-18 (×3): 100 mg via ORAL
  Filled 2016-06-16 (×3): qty 1

## 2016-06-16 MED ORDER — INSULIN ASPART 100 UNIT/ML ~~LOC~~ SOLN
0.0000 [IU] | Freq: Three times a day (TID) | SUBCUTANEOUS | Status: DC
  Administered 2016-06-16 – 2016-06-17 (×3): 2 [IU] via SUBCUTANEOUS

## 2016-06-16 MED ORDER — SODIUM CHLORIDE 0.9 % IV BOLUS (SEPSIS)
1000.0000 mL | Freq: Once | INTRAVENOUS | Status: AC
  Administered 2016-06-16: 1000 mL via INTRAVENOUS

## 2016-06-16 MED ORDER — ACETAMINOPHEN 325 MG PO TABS: 650.0000 mg | ORAL_TABLET | Freq: Four times a day (QID) | ORAL | Status: DC | PRN

## 2016-06-16 MED ORDER — SODIUM CHLORIDE 0.9 % IV SOLN
INTRAVENOUS | Status: DC
Start: 2016-06-16 — End: 2016-06-17
  Administered 2016-06-16: 12:00:00 via INTRAVENOUS

## 2016-06-16 MED ORDER — HEPARIN SODIUM (PORCINE) 5000 UNIT/ML IJ SOLN
5000.0000 [IU] | Freq: Three times a day (TID) | INTRAMUSCULAR | Status: DC
  Administered 2016-06-16 – 2016-06-18 (×7): 5000 [IU] via SUBCUTANEOUS
  Filled 2016-06-16 (×7): qty 1

## 2016-06-16 NOTE — Progress Notes (Signed)
CSW spoke with pt to discuss alcohol abuse.  Pt states that he drank 1/5th of alcohol Monday and Tuesday prior to admission- admits to drinking almost daily before that but in lesser quanitities.  Pt reports that a long time ago he was able to stop for 5 years from drinking- states this was motivated by his health (had a problem with his heart that was affected by his drinking) and can't remember why he started drinking again.  Pt states he stopped drinking that time without the help of outpatient or inpatient facilities.  Pt states he is motivated to stop drinking after this admission but is not interested in rehab resources- states that he feels like if you've been to one meeting you've been to them all and doesn't believe they would help him to stop drinking.  Pt states he has a support system at home to help him stop drinking but does not elaborate.  CSW encouraged pt to reach out if he wanted to discuss his drinking in more depth or changed his mind about wanting resources.  CSW signing off  Jorge Ny, LCSW Clinical Social Worker [phone]

## 2016-06-16 NOTE — Progress Notes (Signed)
CRITICAL VALUE ALERT  Critical value received:  Lactic acid 3.5  Date of notification:  06/16/16  Time of notification:  0346  Critical value read back: yes  Nurse who received alert:  Malen Gauze  MD notified (1st page):  Schorr  Time of first page:  [phone]  MD notified (2nd page):  Time of second page:  Responding MD:  Schorr  Time MD responded:  [phone]
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
## 2016-06-16 NOTE — Progress Notes (Addendum)
Nutrition Brief Note  Patient identified on the Malnutrition Screening Tool (MST) Report.  Wt Readings from Last 15 Encounters:  06/16/16 147 lb 4.3 oz (66.8 kg)  04/16/14 155 lb 6.4 oz (70.5 kg)  06/26/13 152 lb 14.4 oz (69.4 kg)  05/22/13 140 lb (63.5 kg)  02/28/13 146 lb 6.4 oz (66.4 kg)  01/22/13 140 lb (63.5 kg)  01/11/13 155 lb (70.3 kg)  12/19/12 144 lb 6.4 oz (65.5 kg)  12/06/12 145 lb 15.1 oz (66.2 kg)  11/27/12 151 lb 11.2 oz (68.8 kg)  11/10/12 148 lb 3.2 oz (67.2 kg)  10/17/12 161 lb 3.2 oz (73.1 kg)  09/03/12 163 lb (73.9 kg)  07/18/12 163 lb 9.6 oz (74.2 kg)  06/13/12 162 lb 14.7 oz (73.9 kg)    Body mass index is 23.77 kg/m. Patient meets criteria for Normal based on current BMI.   Current diet order is Regular, pt is consuming approximately 100% of meals at this time.  Also receiving MVI with minerals daily.  Labs and medications reviewed.   No nutrition interventions warranted at this time. If nutrition issues arise, please consult RD.   Arthur Holms, RD, LDN Pager #: [phone] After-Hours Pager #: [phone]

## 2016-06-16 NOTE — Progress Notes (Addendum)
Patient seen and examined  60 y.o. male with medical history significant for hypertension, hyperlipidemia, chronic back pain, and alcohol abuse who presents to the emergency department with tremors, sweats, and nausea. Patient presented with moderate alcohol withdrawal symptoms after drinking excessively for 3 days.  Assessment/Plan  1. Alcohol abuse with acute withdrawal  - Last drink was the night of 06/13/2016; he had been drinking a fifth of vodka daily; denies hx of szr  - EtOH level is 46 on admission - Plan for admission to stepdown unit where pt will be monitored with CIWA and given Ativan as indicated  - Banana bag ordered on admission, then daily vitamins and minerals  - UDS  Negative  - Social work consultation requested   Anticipate transfer to telemetry tomorrow  2. Alcoholic ketoacidosis - Serum bicarb is 9 >18 on admission with anion gap of 35  - Suspect this to be secondary to alcoholic ketoacidosis, but infectious process not yet excluded  - CXR without infiltrate and UA remains pending; will check lactate  - Continue IVF hydration; repeat chem panel tomorrow   3. Kidney disease of uncertain chronicity  - SCr is 1.72 on admission; was 1.0 in 2014, the most recent prior value available , slightly improved - His lisinopril and HCTZ will be held while he is hydrated  - Renal ultrasound shows a nonobstructing renal stone - Repeat chem panel tomorrow   4. Leukocytosis - WBC 16,000 on admission with clear CXR and no fever , improving - UA remains pending at this time, but there are no urinary symptoms or suprapubic tenderness  - Plan to obtain blood cultures if febrile    5. Hypertension  - Elevated on admission, likely secondary to alcohol withdrawal and medication non-compliance  - Lisinopril and HCTZ held in light kidney disease and dehydration   - Continue Coreg as tolerated  - Hydralazine IVP's available prn   6. Depression, anxiety - Pt denies SI, HI, or  hallucinations  - Continue Zoloft; Klonopin held while he is being treated with Ativan for withdrawal  - Social work consultation requested

## 2016-06-17 DIAGNOSIS — F10231 Alcohol dependence with withdrawal delirium: Secondary | ICD-10-CM

## 2016-06-17 LAB — COMPREHENSIVE METABOLIC PANEL
ALK PHOS: 49 U/L (ref 38–126)
ALT: 19 U/L (ref 17–63)
ANION GAP: 8 (ref 5–15)
AST: 40 U/L (ref 15–41)
Albumin: 3.4 g/dL — ABNORMAL LOW (ref 3.5–5.0)
BILIRUBIN TOTAL: 0.8 mg/dL (ref 0.3–1.2)
BUN: 16 mg/dL (ref 6–20)
CALCIUM: 8.7 mg/dL — AB (ref 8.9–10.3)
CO2: 25 mmol/L (ref 22–32)
Chloride: 102 mmol/L (ref 101–111)
Creatinine, Ser: 1.13 mg/dL (ref 0.61–1.24)
GFR calc non Af Amer: >60 mL/min (ref >60)
Glucose, Bld: 95 mg/dL (ref 65–99)
Potassium: 3.3 mmol/L — ABNORMAL LOW (ref 3.5–5.1)
Sodium: 135 mmol/L (ref 135–145)
TOTAL PROTEIN: 5.6 g/dL — AB (ref 6.5–8.1)

## 2016-06-17 LAB — HEMOGLOBIN A1C
Hgb A1c MFr Bld: 5.3 % (ref 4.8–5.6)
Mean Plasma Glucose: 105 mg/dL

## 2016-06-17 LAB — CBC
HCT: 33.7 % — ABNORMAL LOW (ref 39.0–52.0)
HEMOGLOBIN: 11.7 g/dL — AB (ref 13.0–17.0)
MCH: 29.7 pg (ref 26.0–34.0)
MCHC: 34.7 g/dL (ref 30.0–36.0)
MCV: 85.5 fL (ref 78.0–100.0)
PLATELETS: 200 10*3/uL (ref 150–400)
RBC: 3.94 MIL/uL — AB (ref 4.22–5.81)
RDW: 13.6 % (ref 11.5–15.5)
WBC: 8.9 10*3/uL (ref 4.0–10.5)

## 2016-06-17 LAB — UREA NITROGEN, URINE: Urea Nitrogen, Ur: 628 mg/dL

## 2016-06-17 LAB — GLUCOSE, CAPILLARY
GLUCOSE-CAPILLARY: 107 mg/dL — AB (ref 65–99)
GLUCOSE-CAPILLARY: 106 mg/dL — AB (ref 65–99)
Glucose-Capillary: 118 mg/dL — ABNORMAL HIGH (ref 65–99)
Glucose-Capillary: 186 mg/dL — ABNORMAL HIGH (ref 65–99)

## 2016-06-17 LAB — URINE CULTURE: CULTURE: NO GROWTH

## 2016-06-17 MED ORDER — VITAMIN B-1 100 MG PO TABS
100.0000 mg | ORAL_TABLET | Freq: Every day | ORAL | Status: DC
  Administered 2016-06-18: 100 mg via ORAL
  Filled 2016-06-17: qty 1

## 2016-06-17 MED ORDER — POTASSIUM CHLORIDE CRYS ER 20 MEQ PO TBCR
40.0000 meq | EXTENDED_RELEASE_TABLET | Freq: Two times a day (BID) | ORAL | Status: DC
  Administered 2016-06-17 – 2016-06-18 (×3): 40 meq via ORAL
  Filled 2016-06-17 (×3): qty 2

## 2016-06-17 MED ORDER — PANTOPRAZOLE SODIUM 40 MG PO TBEC
40.0000 mg | DELAYED_RELEASE_TABLET | Freq: Every day | ORAL | Status: DC
  Administered 2016-06-18: 40 mg via ORAL
  Filled 2016-06-17: qty 1

## 2016-06-17 MED ORDER — POTASSIUM CHLORIDE IN NACL 40-0.9 MEQ/L-% IV SOLN
INTRAVENOUS | Status: DC
  Administered 2016-06-17 – 2016-06-18 (×2): 75 mL/h via INTRAVENOUS
  Filled 2016-06-17 (×3): qty 1000

## 2016-06-17 MED ORDER — MAGNESIUM OXIDE 400 (241.3 MG) MG PO TABS
400.0000 mg | ORAL_TABLET | Freq: Two times a day (BID) | ORAL | Status: DC
  Administered 2016-06-17 – 2016-06-18 (×3): 400 mg via ORAL
  Filled 2016-06-17 (×3): qty 1

## 2016-06-17 MED ORDER — FOLIC ACID 1 MG PO TABS
1.0000 mg | ORAL_TABLET | Freq: Every day | ORAL | Status: DC
  Administered 2016-06-18: 1 mg via ORAL
  Filled 2016-06-17: qty 1

## 2016-06-17 NOTE — Progress Notes (Signed)
[facility] PROGRESS NOTE  Andrew [name] [id number] DOB: [birth year] DOA: 06/15/2016   PCP: Benito Mccreedy, MD     Assessment/Plan: Principal Problem:   Alcohol withdrawal ([place]) Active Problems:   Depression with anxiety   Hypertension   S/P BKA (below knee amputation) ([place])   Alcoholism ([place])   Kidney disease   Leukocytosis   Alcoholic ketoacidosis     60 y.o.malewith medical history significant for hypertension, hyperlipidemia, chronic back pain, and alcohol abuse who presents to the emergency department with tremors, sweats, and nausea. Patient presented with moderate alcohol withdrawal symptoms after drinking excessively for 3 days.  Assessment/Plan  1. Alcohol abuse with acute withdrawal  - Last drink was the night of 06/13/2016; he had been drinking a fifth of vodka daily; denies hx of szr  - EtOH level is 46 on admission - Continue CIWA protocol, transfer to telemetry Monitor for 1 more day, if no withdrawal, discharge tomorrow  2. Alcoholic ketoacidosis-resolved - Serum bicarb is 9 >18 >25, improving, anion gap 8 - Suspect this to be secondary to alcoholic ketoacidosis, but infectious process not yet excluded  - CXR without infiltrate and UA  negative  3. Kidney disease of uncertain chronicity  - SCr is 1.72 on admission; was 1.0 in 2014, improved with IV fluids, now 1.13 - Continue to hold antihypertensive meds - Renal ultrasound shows a nonobstructing renal stone - Repeat chem panel tomorrow   4. Leukocytosis - WBC 16,000 on admission with clear CXR and no fever , resolved - UA remains pending at this time, but there are no urinary symptoms or suprapubic tenderness  - Plan to obtain blood cultures if febrile   5. Hypertension  - Elevated on admission, likely secondary to alcohol withdrawal and medication non-compliance  - Lisinopril and HCTZ held in light kidney disease and dehydration  - Continue Coreg as tolerated  -  Hydralazine IVP's available prn   6. Depression, anxiety - Pt denies SI, HI, or hallucinations  - Continue Zoloft; Klonopin held while he is being treated with Ativan for withdrawal  - Social work consultation requested   DVT prophylaxsis heparin   Code Status:  Full code      Family Communication: Discussed in detail with the patient, all imaging results, lab results explained to the patient   Disposition Plan:  Tomorrow     Consultants:  None  Procedures:  None  Antibiotics: Anti-infectives    None         HPI/Subjective: Does not seem to be in withdrawal, alert and oriented, denies any chest pain or shortness of breath  Objective: Vitals:   06/17/16 0325 06/17/16 0329 06/17/16 0330 06/17/16 0826  BP:  (!) 171/58 (!) 167/92 (!) 167/95  Pulse:  88  71  Resp:  20  17  Temp:   98.3 F (36.8 C) 98.4 F (36.9 C)  TempSrc:   Oral Oral  SpO2:  98%  98%  Weight: 68.4 kg (150 lb 14.4 oz)     Height:        Intake/Output Summary (Last 24 hours) at 06/17/16 0839 Last data filed at 06/17/16 0700  Gross per 24 hour  Intake             3423 ml  Output             3300 ml  Net              123 ml    Exam:  Examination:  General exam: Appears calm and comfortable  Respiratory system: Clear to auscultation. Respiratory effort normal. Cardiovascular system: S1 & S2 heard, RRR. No JVD, murmurs, rubs, gallops or clicks. No pedal edema. Gastrointestinal system: Abdomen is nondistended, soft and nontender. No organomegaly or masses felt. Normal bowel sounds heard. Central nervous system: Alert and oriented. No focal neurological deficits. Extremities: Symmetric 5 x 5 power. Skin: No rashes, lesions or ulcers Psychiatry: Judgement and insight appear normal. Mood & affect appropriate.     Data Reviewed: I have personally reviewed following labs and imaging studies  Micro Results Recent Results (from the past 240 hour(s))  MRSA PCR Screening     Status:  None   Collection Time: 06/16/16  2:27 AM  Result Value Ref Range Status   MRSA by PCR NEGATIVE NEGATIVE Final    Comment:        The GeneXpert MRSA Assay (FDA approved for NASAL specimens only), is one component of a comprehensive MRSA colonization surveillance program. It is not intended to diagnose MRSA infection nor to guide or monitor treatment for MRSA infections.   Urine culture     Status: None   Collection Time: 06/16/16 10:14 AM  Result Value Ref Range Status   Specimen Description URINE, RANDOM  Final   Special Requests NONE  Final   Culture NO GROWTH  Final   Report Status 06/17/2016 FINAL  Final    Radiology Reports Dg Chest 2 View  Result Date: 06/16/2016 CLINICAL DATA:  Cough today.  Smoker. EXAM: CHEST  2 VIEW COMPARISON:  Single-view of the chest 06/15/2016. PA and lateral chest 05/23/2013. FINDINGS: The chest is hyperexpanded with attenuation of the pulmonary vasculature. Minimal linear atelectasis is seen the left lung base. Lungs are otherwise clear. Heart size is normal. No pneumothorax or pleural effusion. IMPRESSION: COPD without acute disease Electronically Signed   By: Inge Rise M.D.   On: 06/16/2016 14:23   US Renal  Result Date: 06/16/2016 CLINICAL DATA:  Acute renal injury EXAM: RENAL / URINARY TRACT ULTRASOUND COMPLETE COMPARISON:  04/18/2011 CT FINDINGS: Right Kidney: Length: 11.2 cm. Echogenicity within normal limits. No mass or hydronephrosis visualized. Left Kidney: Length: 10.6 cm . Echogenicity within normal limits. There is a linear echogenic focus in the mid left kidney suspicious for either vascular calcification or nonobstructing stone. This measures approximately 8 x 2 mm. No mass or hydronephrosis visualized. Bladder: Appears normal for degree of bladder distention. IMPRESSION: Echogenic calculus in the mid left kidney without obstruction. Findings may be vascular in etiology versus a nonobstructing renal stone. No loss of  cortical-medullary distinction of either kidney. The urinary bladder is moderately distended. Electronically Signed   By: Ashley Royalty M.D.   On: 06/16/2016 03:12   Dg Chest Port 1 View  Result Date: 06/15/2016 CLINICAL DATA:  Chest pain and alcohol withdrawal EXAM: PORTABLE CHEST 1 VIEW COMPARISON:  Chest radiograph 05/23/2013 FINDINGS: The lungs are hyperexpanded with diffusely increased pulmonary markings suggestive of COPD. Cardiomediastinal contours are unchanged. There is no focal airspace consolidation or pulmonary edema. There is no pneumothorax or sizable pleural effusion. IMPRESSION: 1. COPD. 2. No focal airspace disease. Electronically Signed   By: Ulyses Jarred M.D.   On: 06/15/2016 23:50     CBC  Recent Labs Lab 06/15/16 2347 06/16/16 0421 06/17/16 0149  WBC 16.0* 12.2* 8.9  HGB 15.2 12.6* 11.7*  HCT 43.5 36.4* 33.7*  PLT 351 271 200  MCV 87.7 86.1 85.5  MCH 30.6 29.8 29.7  MCHC 34.9 34.6 34.7  RDW 14.2 13.7 13.6  LYMPHSABS 1.1  --   --   MONOABS 1.5*  --   --   EOSABS 0.0  --   --   BASOSABS 0.0  --   --     Chemistries   Recent Labs Lab 06/15/16 2347 06/16/16 0654 06/17/16 0149  NA 138 131* 135  K 3.5 3.8 3.3*  CL 94* 95* 102  CO2 9* 18* 25  GLUCOSE 95 87 95  BUN 27* 23* 16  CREATININE 1.72* 1.55* 1.13  CALCIUM 9.8 8.0* 8.7*  MG  --  1.4*  --   AST 55*  --  40  ALT 26  --  19  ALKPHOS 73  --  49  BILITOT 1.0  --  0.8   ------------------------------------------------------------------------------------------------------------------ estimated creatinine clearance is 64.3 mL/min (by C-G formula based on SCr of 1.13 mg/dL). ------------------------------------------------------------------------------------------------------------------  Recent Labs  06/16/16 0654  HGBA1C 5.3   ------------------------------------------------------------------------------------------------------------------ No results for input(s): CHOL, HDL, LDLCALC, TRIG,  CHOLHDL, LDLDIRECT in the last 72 hours. ------------------------------------------------------------------------------------------------------------------ No results for input(s): TSH, T4TOTAL, T3FREE, THYROIDAB in the last 72 hours.  Invalid input(s): FREET3 ------------------------------------------------------------------------------------------------------------------ No results for input(s): VITAMINB12, FOLATE, FERRITIN, TIBC, IRON, RETICCTPCT in the last 72 hours.  Coagulation profile No results for input(s): INR, PROTIME in the last 168 hours.  No results for input(s): DDIMER in the last 72 hours.  Cardiac Enzymes  Recent Labs Lab 06/15/16 2347  TROPONINI <0.03   ------------------------------------------------------------------------------------------------------------------ Invalid input(s): POCBNP   CBG:  Recent Labs Lab 06/16/16 1223 06/16/16 1613 06/16/16 2135 06/17/16 0825  GLUCAP 180* 154* 168* 107*       Studies: Dg Chest 2 View  Result Date: 06/16/2016 CLINICAL DATA:  Cough today.  Smoker. EXAM: CHEST  2 VIEW COMPARISON:  Single-view of the chest 06/15/2016. PA and lateral chest 05/23/2013. FINDINGS: The chest is hyperexpanded with attenuation of the pulmonary vasculature. Minimal linear atelectasis is seen the left lung base. Lungs are otherwise clear. Heart size is normal. No pneumothorax or pleural effusion. IMPRESSION: COPD without acute disease Electronically Signed   By: Inge Rise M.D.   On: 06/16/2016 14:23   US Renal  Result Date: 06/16/2016 CLINICAL DATA:  Acute renal injury EXAM: RENAL / URINARY TRACT ULTRASOUND COMPLETE COMPARISON:  04/18/2011 CT FINDINGS: Right Kidney: Length: 11.2 cm. Echogenicity within normal limits. No mass or hydronephrosis visualized. Left Kidney: Length: 10.6 cm . Echogenicity within normal limits. There is a linear echogenic focus in the mid left kidney suspicious for either vascular calcification or  nonobstructing stone. This measures approximately 8 x 2 mm. No mass or hydronephrosis visualized. Bladder: Appears normal for degree of bladder distention. IMPRESSION: Echogenic calculus in the mid left kidney without obstruction. Findings may be vascular in etiology versus a nonobstructing renal stone. No loss of cortical-medullary distinction of either kidney. The urinary bladder is moderately distended. Electronically Signed   By: Ashley Royalty M.D.   On: 06/16/2016 03:12   Dg Chest Port 1 View  Result Date: 06/15/2016 CLINICAL DATA:  Chest pain and alcohol withdrawal EXAM: PORTABLE CHEST 1 VIEW COMPARISON:  Chest radiograph 05/23/2013 FINDINGS: The lungs are hyperexpanded with diffusely increased pulmonary markings suggestive of COPD. Cardiomediastinal contours are unchanged. There is no focal airspace consolidation or pulmonary edema. There is no pneumothorax or sizable pleural effusion. IMPRESSION: 1. COPD. 2. No focal airspace disease. Electronically Signed   By: Ulyses Jarred M.D.   On: 06/15/2016 23:50  Lab Results  Component Value Date   HGBA1C 5.3 06/16/2016   HGBA1C 5.8 (H) 10/17/2012   HGBA1C 5.7 (H) 05/12/2011   Lab Results  Component Value Date   MICROALBUR 0.31 06/25/2007   LDLCALC 51 10/17/2012   CREATININE 1.13 06/17/2016       Scheduled Meds: . carvedilol  6.25 mg Oral BID WC  . feeding supplement (ENSURE ENLIVE)  237 mL Oral BID BM  . fenofibrate  54 mg Oral Daily  . folic acid  1 mg Intravenous Daily  . gabapentin  300 mg Oral TID  . heparin  5,000 Units Subcutaneous Q8H  . insulin aspart  0-9 Units Subcutaneous TID WC  . multivitamin with minerals  1 tablet Oral Daily  . pantoprazole  40 mg Oral Daily  . sertraline  100 mg Oral Daily  . simvastatin  40 mg Oral QHS  . sodium chloride flush  3 mL Intravenous Q12H  . thiamine  100 mg Intravenous Daily   Continuous Infusions: . sodium chloride 75 mL/hr at 06/16/16 1227     LOS: 1 day    Time spent:  >30 MINS    [facility] Pager [phone]. If 7PM-7AM, please contact night-coverage at [email or web address], password [facility] 06/17/2016, 8:39 AM  LOS: 1 day

## 2016-06-18 DIAGNOSIS — F10239 Alcohol dependence with withdrawal, unspecified: Principal | ICD-10-CM

## 2016-06-18 LAB — COMPREHENSIVE METABOLIC PANEL
ALBUMIN: 3.6 g/dL (ref 3.5–5.0)
ALT: 25 U/L (ref 17–63)
AST: 46 U/L — AB (ref 15–41)
Alkaline Phosphatase: 51 U/L (ref 38–126)
Anion gap: 7 (ref 5–15)
BILIRUBIN TOTAL: 1.2 mg/dL (ref 0.3–1.2)
BUN: 10 mg/dL (ref 6–20)
CHLORIDE: 104 mmol/L (ref 101–111)
CO2: 24 mmol/L (ref 22–32)
Calcium: 9.1 mg/dL (ref 8.9–10.3)
Creatinine, Ser: 0.99 mg/dL (ref 0.61–1.24)
GFR calc Af Amer: >60 mL/min (ref >60)
GFR calc non Af Amer: >60 mL/min (ref >60)
GLUCOSE: 97 mg/dL (ref 65–99)
POTASSIUM: 4.2 mmol/L (ref 3.5–5.1)
SODIUM: 135 mmol/L (ref 135–145)
Total Protein: 5.9 g/dL — ABNORMAL LOW (ref 6.5–8.1)

## 2016-06-18 LAB — GLUCOSE, CAPILLARY
Glucose-Capillary: 93 mg/dL (ref 65–99)
Glucose-Capillary: 115 mg/dL — ABNORMAL HIGH (ref 65–99)

## 2016-06-18 MED ORDER — PANTOPRAZOLE SODIUM 40 MG PO TBEC: 40.0000 mg | DELAYED_RELEASE_TABLET | Freq: Every day | ORAL | 1 refills | Status: DC

## 2016-06-18 MED ORDER — LISINOPRIL-HYDROCHLOROTHIAZIDE 20-25 MG PO TABS: 2.0000 | ORAL_TABLET | Freq: Every day | ORAL | 1 refills | Status: DC

## 2016-06-18 MED ORDER — MAGNESIUM OXIDE 400 (241.3 MG) MG PO TABS: 400.0000 mg | ORAL_TABLET | Freq: Two times a day (BID) | ORAL | 0 refills | Status: DC

## 2016-06-18 MED ORDER — CLONAZEPAM 1 MG PO TABS: ORAL_TABLET | ORAL | 0 refills | Status: DC

## 2016-06-18 MED ORDER — ENSURE ENLIVE PO LIQD: 237.0000 mL | Freq: Two times a day (BID) | ORAL | 12 refills | Status: DC

## 2016-06-18 MED ORDER — THIAMINE HCL 100 MG PO TABS: 100.0000 mg | ORAL_TABLET | Freq: Every day | ORAL | 0 refills | Status: DC

## 2016-06-18 MED ORDER — FOLIC ACID 1 MG PO TABS: 1.0000 mg | ORAL_TABLET | Freq: Every day | ORAL | 1 refills | Status: DC

## 2016-06-18 NOTE — Discharge Instructions (Signed)
Alcohol Use Disorder °Alcohol use disorder is a mental disorder. It is not a one-time incident of heavy drinking. Alcohol use disorder is the excessive and uncontrollable use of alcohol over time that leads to problems with functioning in one or more areas of daily living. People with this disorder risk harming themselves and others when they drink to excess. Alcohol use disorder also can cause other mental disorders, such as mood and anxiety disorders, and serious physical problems. People with alcohol use disorder often misuse other drugs.  °Alcohol use disorder is common and widespread. Some people with this disorder drink alcohol to cope with or escape from negative life events. Others drink to relieve chronic pain or symptoms of mental illness. People with a family history of alcohol use disorder are at higher risk of losing control and using alcohol to excess.  °Drinking too much alcohol can cause injury, accidents, and health problems. One drink can be too much when you are: °· Working. °· Pregnant or breastfeeding. °· Taking medicines. Ask your doctor. °· Driving or planning to drive. °SYMPTOMS  °Signs and symptoms of alcohol use disorder may include the following:  °· Consumption of alcohol in larger amounts or over a longer period of time than intended. °· Multiple unsuccessful attempts to cut down or control alcohol use.   °· A great deal of time spent obtaining alcohol, using alcohol, or recovering from the effects of alcohol (hangover). °· A strong desire or urge to use alcohol (cravings).   °· Continued use of alcohol despite problems at work, school, or home because of alcohol use.   °· Continued use of alcohol despite problems in relationships because of alcohol use. °· Continued use of alcohol in situations when it is physically hazardous, such as driving a car. °· Continued use of alcohol despite awareness of a physical or psychological problem that is likely related to alcohol use. Physical  problems related to alcohol use can involve the brain, heart, liver, stomach, and intestines. Psychological problems related to alcohol use include intoxication, depression, anxiety, psychosis, delirium, and dementia.   °· The need for increased amounts of alcohol to achieve the same desired effect, or a decreased effect from the consumption of the same amount of alcohol (tolerance). °· Withdrawal symptoms upon reducing or stopping alcohol use, or alcohol use to reduce or avoid withdrawal symptoms. Withdrawal symptoms include: °¨ Racing heart. °¨ Hand tremor. °¨ Difficulty sleeping. °¨ Nausea. °¨ Vomiting. °¨ Hallucinations. °¨ Restlessness. °¨ Seizures. °DIAGNOSIS °Alcohol use disorder is diagnosed through an assessment by your health care provider. Your health care provider may start by asking three or four questions to screen for excessive or problematic alcohol use. To confirm a diagnosis of alcohol use disorder, at least two symptoms must be present within a 12-month period. The severity of alcohol use disorder depends on the number of symptoms: °· Mild--two or three. °· Moderate--four or five. °· Severe--six or more. °Your health care provider may perform a physical exam or use results from lab tests to see if you have physical problems resulting from alcohol use. Your health care provider may refer you to a mental health professional for evaluation. °TREATMENT  °Some people with alcohol use disorder are able to reduce their alcohol use to low-risk levels. Some people with alcohol use disorder need to quit drinking alcohol. When necessary, mental health professionals with specialized training in substance use treatment can help. Your health care provider can help you decide how severe your alcohol use disorder is and what type of treatment you need.   The following forms of treatment are available:   Detoxification. Detoxification involves the use of prescription medicines to prevent alcohol withdrawal  symptoms in the first week after quitting. This is important for people with a history of symptoms of withdrawal and for heavy drinkers who are likely to have withdrawal symptoms. Alcohol withdrawal can be dangerous and, in severe cases, cause death. Detoxification is usually provided in a hospital or in-patient substance use treatment facility.  Counseling or talk therapy. Talk therapy is provided by substance use treatment counselors. It addresses the reasons people use alcohol and ways to keep them from drinking again. The goals of talk therapy are to help people with alcohol use disorder find healthy activities and ways to cope with life stress, to identify and avoid triggers for alcohol use, and to handle cravings, which can cause relapse.  Medicines.Different medicines can help treat alcohol use disorder through the following actions:  Decrease alcohol cravings.  Decrease the positive reward response felt from alcohol use.  Produce an uncomfortable physical reaction when alcohol is used (aversion therapy).  Support groups. Support groups are run by people who have quit drinking. They provide emotional support, advice, and guidance. These forms of treatment are often combined. Some people with alcohol use disorder benefit from intensive combination treatment provided by specialized substance use treatment centers. Both inpatient and outpatient treatment programs are available.   This information is not intended to replace advice given to you by your health care provider. Make sure you discuss any questions you have with your health care provider.   Document Released: 09/01/2004 Document Revised: 08/15/2014 Document Reviewed: 11/01/2012 Elsevier Interactive Patient Education 2016 [employer].   Heart-Healthy Eating Plan Many factors influence your heart health, including eating and exercise habits. Heart (coronary) risk increases with abnormal blood fat (lipid) levels. Heart-healthy meal  planning includes limiting unhealthy fats, increasing healthy fats, and making other small dietary changes. This includes maintaining a healthy body weight to help keep lipid levels within a normal range. WHAT IS MY PLAN?  Your health care provider recommends that you:  Get no more than _________% of the total calories in your daily diet from fat.  Limit your intake of saturated fat to less than _________% of your total calories each day.  Limit the amount of cholesterol in your diet to less than _________ mg per day. WHAT TYPES OF FAT SHOULD I CHOOSE?  Choose healthy fats more often. Choose monounsaturated and polyunsaturated fats, such as olive oil and canola oil, flaxseeds, walnuts, almonds, and seeds.  Eat more omega-3 fats. Good choices include salmon, mackerel, sardines, tuna, flaxseed oil, and ground flaxseeds. Aim to eat fish at least two times each week.  Limit saturated fats. Saturated fats are primarily found in animal products, such as meats, butter, and cream. Plant sources of saturated fats include palm oil, palm kernel oil, and coconut oil.  Avoid foods with partially hydrogenated oils in them. These contain trans fats. Examples of foods that contain trans fats are stick margarine, some tub margarines, cookies, crackers, and other baked goods. WHAT GENERAL GUIDELINES DO I NEED TO FOLLOW?  Check food labels carefully to identify foods with trans fats or high amounts of saturated fat.  Fill one half of your plate with vegetables and green salads. Eat 4-5 servings of vegetables per day. A serving of vegetables equals 1 cup of raw leafy vegetables,  cup of raw or cooked cut-up vegetables, or  cup of vegetable juice.  Fill one fourth of  your plate with whole grains. Look for the word "whole" as the first word in the ingredient list.  Fill one fourth of your plate with lean protein foods.  Eat 4-5 servings of fruit per day. A serving of fruit equals one medium whole fruit,   cup of dried fruit,  cup of fresh, frozen, or canned fruit, or  cup of 100% fruit juice.  Eat more foods that contain soluble fiber. Examples of foods that contain this type of fiber are apples, broccoli, carrots, beans, peas, and barley. Aim to get 20-30 g of fiber per day.  Eat more home-cooked food and less restaurant, buffet, and fast food.  Limit or avoid alcohol.  Limit foods that are high in starch and sugar.  Avoid fried foods.  Cook foods by using methods other than frying. Baking, boiling, grilling, and broiling are all great options. Other fat-reducing suggestions include:  Removing the skin from poultry.  Removing all visible fats from meats.  Skimming the fat off of stews, soups, and gravies before serving them.  Steaming vegetables in water or broth.  Lose weight if you are overweight. Losing just 5-10% of your initial body weight can help your overall health and prevent diseases such as diabetes and heart disease.  Increase your consumption of nuts, legumes, and seeds to 4-5 servings per week. One serving of dried beans or legumes equals  cup after being cooked, one serving of nuts equals 1 ounces, and one serving of seeds equals  ounce or 1 tablespoon.  You may need to monitor your salt (sodium) intake, especially if you have high blood pressure. Talk with your health care provider or dietitian to get more information about reducing sodium. WHAT FOODS CAN I EAT? Grains Breads, including [place], white, pita, wheat, raisin, rye, oatmeal, and [place]. Tortillas that are neither fried nor made with lard or trans fat. Low-fat rolls, including hotdog and hamburger buns and English muffins. Biscuits. Muffins. Waffles. Pancakes. Light popcorn. Whole-grain cereals. Flatbread. Melba toast. Pretzels. Breadsticks. Rusks. Low-fat snacks and crackers, including oyster, saltine, matzo, graham, animal, and rye. Rice and pasta, including brown rice and those that are made with whole  wheat. Vegetables All vegetables. Fruits All fruits, but limit coconut. Meats and Other Protein Sources Lean, well-trimmed beef, veal, pork, and lamb. Chicken and [place] without skin. All fish and shellfish. Wild duck, rabbit, pheasant, and venison. Egg whites or low-cholesterol egg substitutes. Dried beans, peas, lentils, and tofu.Seeds and most nuts. Dairy Low-fat or nonfat cheeses, including ricotta, string, and mozzarella. Skim or 1% milk that is liquid, powdered, or evaporated. Buttermilk that is made with low-fat milk. Nonfat or low-fat yogurt. Beverages Mineral water. Diet carbonated beverages. Sweets and Desserts Sherbets and fruit ices. Honey, jam, marmalade, jelly, and syrups. Meringues and gelatins. Pure sugar candy, such as hard candy, jelly beans, gumdrops, mints, marshmallows, and small amounts of dark chocolate. [employer] Eat all sweets and desserts in moderation. Fats and Oils Nonhydrogenated (trans-free) margarines. Vegetable oils, including soybean, sesame, sunflower, olive, peanut, safflower, corn, canola, and cottonseed. Salad dressings or mayonnaise that are made with a vegetable oil. Limit added fats and oils that you use for cooking, baking, salads, and as spreads. Other Cocoa powder. Coffee and tea. All seasonings and condiments. The items listed above may not be a complete list of recommended foods or beverages. Contact your dietitian for more options. WHAT FOODS ARE NOT RECOMMENDED? Grains Breads that are made with saturated or trans fats, oils, or whole milk.  Croissants. Butter rolls. Cheese breads. Sweet rolls. Donuts. Buttered popcorn. Chow mein noodles. High-fat crackers, such as cheese or butter crackers. Meats and Other Protein Sources Fatty meats, such as hotdogs, short ribs, sausage, spareribs, bacon, ribeye roast or steak, and mutton. High-fat deli meats, such as salami and bologna. Caviar. Domestic duck and goose. Organ meats, such as kidney, liver,  sweetbreads, brains, gizzard, chitterlings, and heart. Dairy Cream, sour cream, cream cheese, and creamed cottage cheese. Whole milk cheeses, including blue (bleu), Monte[place] Jack, [place], [place], American, [place], Swiss, [place], [place], and [place]. Whole or 2% milk that is liquid, evaporated, or condensed. Whole buttermilk. Cream sauce or high-fat cheese sauce. Yogurt that is made from whole milk. Beverages Regular sodas and drinks with added sugar. Sweets and Desserts Frosting. Pudding. Cookies. Cakes other than angel food cake. Candy that has milk chocolate or white chocolate, hydrogenated fat, butter, coconut, or unknown ingredients. Buttered syrups. Full-fat ice cream or ice cream drinks. Fats and Oils Gravy that has suet, meat fat, or shortening. Cocoa butter, hydrogenated oils, palm oil, coconut oil, palm kernel oil. These can often be found in baked products, candy, fried foods, nondairy creamers, and whipped toppings. Solid fats and shortenings, including bacon fat, salt pork, lard, and butter. Nondairy cream substitutes, such as coffee creamers and sour cream substitutes. Salad dressings that are made of unknown oils, cheese, or sour cream. The items listed above may not be a complete list of foods and beverages to avoid. Contact your dietitian for more information.   This information is not intended to replace advice given to you by your health care provider. Make sure you discuss any questions you have with your health care provider.   Document Released: 05/03/2008 Document Revised: 08/15/2014 Document Reviewed: 01/16/2014 Elsevier Interactive Patient Education [employer].

## 2016-06-18 NOTE — Discharge Summary (Addendum)
Physician Discharge Summary  Andrew [name] MRN: [id number] DOB/AGE: 58-Mar-1957 58 y.o.  PCP: Benito Mccreedy, MD   Admit date: 06/15/2016 Discharge date: 06/18/2016  Discharge Diagnoses:    Principal Problem:   Alcohol withdrawal ([place]) Active Problems:   Depression with anxiety   Hypertension   S/P BKA (below knee amputation) ([place])   Alcoholism ([place])   Kidney disease   Leukocytosis   Alcoholic ketoacidosis    Follow-up recommendations Follow-up with PCP in 3-5 days , including all  additional recommended appointments as below Follow-up CBC, CMP in 3-5 days Patient would need outpatient follow-up for his psychiatric issues     Current Discharge Medication List    START taking these medications   Details  feeding supplement, ENSURE ENLIVE, (ENSURE ENLIVE) LIQD Take 237 mLs by mouth 2 (two) times daily between meals. Qty: 237 mL, Refills: 12    folic acid (FOLVITE) 1 MG tablet Take 1 tablet (1 mg total) by mouth daily. Qty: 30 tablet, Refills: 1    magnesium oxide (MAG-OX) 400 (241.3 Mg) MG tablet Take 1 tablet (400 mg total) by mouth 2 (two) times daily. Qty: 60 tablet, Refills: 0    pantoprazole (PROTONIX) 40 MG tablet Take 1 tablet (40 mg total) by mouth daily. Qty: 30 tablet, Refills: 1    thiamine 100 MG tablet Take 1 tablet (100 mg total) by mouth daily. Qty: 30 tablet, Refills: 0      CONTINUE these medications which have CHANGED   Details  clonazePAM (KLONOPIN) 1 MG tablet TAKE 0.5 TABLET BY MOUTH 3 TIMES A DAY AS NEEDED FOR ANXIETY Qty: 15 tablet, Refills: 0    lisinopril-hydrochlorothiazide (PRINZIDE,ZESTORETIC) 20-25 MG tablet Take 2 tablets by mouth daily. Qty: 60 tablet, Refills: 1      CONTINUE these medications which have NOT CHANGED   Details  aspirin EC 81 MG tablet Take 1 tablet (81 mg total) by mouth daily.    carvedilol (COREG) 6.25 MG tablet Take 6.25 mg by mouth 2 (two) times daily with a meal.    fenofibrate (TRICOR) 48 MG  tablet Take 48 mg by mouth daily.    gabapentin (NEURONTIN) 300 MG capsule TAKE ONE CAPSULE 3 TIMES A DAY Qty: 270 capsule, Refills: 3    HYDROcodone-acetaminophen (NORCO) 10-325 MG per tablet Take 1 tablet by mouth every 4 (four) hours as needed. Fill 30 days after previous script Qty: 50 tablet, Refills: 0    nicotine (NICODERM CQ - DOSED IN MG/24 HOURS) 21 mg/24hr patch Place 1 patch (21 mg total) onto the skin daily. Qty: 30 patch, Refills: 1    sertraline (ZOLOFT) 100 MG tablet TAKE 1 TABLET BY MOUTH DAILY. Qty: 30 tablet, Refills: 0    simvastatin (ZOCOR) 40 MG tablet TAKE 1 TABLET BY MOUTH AT BEDTIME. Qty: 90 tablet, Refills: 3    traMADol (ULTRAM) 50 MG tablet Take 1 tablet (50 mg total) by mouth every 6 (six) hours as needed. Qty: 120 tablet, Refills: 2    Vitamin D, Ergocalciferol, (DRISDOL) 50000 units CAPS capsule Take 50,000 Units by mouth 2 (two) times a week.    Vitamins-Lipotropics (B COMPLEX FORMULA 1 PO) Take 1 tablet by mouth daily.        STOP taking these medications     enalapril (VASOTEC) 20 MG tablet      enalapril (VASOTEC) 20 MG tablet      omeprazole (PRILOSEC) 20 MG capsule         Discharge Condition: Stable   Discharge  Instructions Get Medicines reviewed and adjusted: Please take all your medications with you for your next visit with your Primary MD  Please request your Primary MD to go over all hospital tests and procedure/radiological results at the follow up, please ask your Primary MD to get all Hospital records sent to his/her office.  If you experience worsening of your admission symptoms, develop shortness of breath, life threatening emergency, suicidal or homicidal thoughts you must seek medical attention immediately by calling 911 or calling your MD immediately if symptoms less severe.  You must read complete instructions/literature along with all the possible adverse reactions/side effects for all the Medicines you take and that  have been prescribed to you. Take any new Medicines after you have completely understood and accpet all the possible adverse reactions/side effects.   Do not drive when taking Pain medications.   Do not take more than prescribed Pain, Sleep and Anxiety Medications  Special Instructions: If you have smoked or chewed Tobacco in the last 2 yrs please stop smoking, stop any regular Alcohol and or any Recreational drug use.  Wear Seat belts while driving.  Please note  You were cared for by a hospitalist during your hospital stay. Once you are discharged, your primary care physician will handle any further medical issues. Please note that NO REFILLS for any discharge medications will be authorized once you are discharged, as it is imperative that you return to your primary care physician (or establish a relationship with a primary care physician if you do not have one) for your aftercare needs so that they can reassess your need for medications and monitor your lab values.     No Known Allergies    Disposition: 01-Home or Self Care   Consults:     Significant Diagnostic Studies:  Dg Chest 2 View  Result Date: 06/16/2016 CLINICAL DATA:  Cough today.  Smoker. EXAM: CHEST  2 VIEW COMPARISON:  Single-view of the chest 06/15/2016. PA and lateral chest 05/23/2013. FINDINGS: The chest is hyperexpanded with attenuation of the pulmonary vasculature. Minimal linear atelectasis is seen the left lung base. Lungs are otherwise clear. Heart size is normal. No pneumothorax or pleural effusion. IMPRESSION: COPD without acute disease Electronically Signed   By: Inge Rise M.D.   On: 06/16/2016 14:23   US Renal  Result Date: 06/16/2016 CLINICAL DATA:  Acute renal injury EXAM: RENAL / URINARY TRACT ULTRASOUND COMPLETE COMPARISON:  04/18/2011 CT FINDINGS: Right Kidney: Length: 11.2 cm. Echogenicity within normal limits. No mass or hydronephrosis visualized. Left Kidney: Length: 10.6 cm .  Echogenicity within normal limits. There is a linear echogenic focus in the mid left kidney suspicious for either vascular calcification or nonobstructing stone. This measures approximately 8 x 2 mm. No mass or hydronephrosis visualized. Bladder: Appears normal for degree of bladder distention. IMPRESSION: Echogenic calculus in the mid left kidney without obstruction. Findings may be vascular in etiology versus a nonobstructing renal stone. No loss of cortical-medullary distinction of either kidney. The urinary bladder is moderately distended. Electronically Signed   By: Ashley Royalty M.D.   On: 06/16/2016 03:12   Dg Chest Port 1 View  Result Date: 06/15/2016 CLINICAL DATA:  Chest pain and alcohol withdrawal EXAM: PORTABLE CHEST 1 VIEW COMPARISON:  Chest radiograph 05/23/2013 FINDINGS: The lungs are hyperexpanded with diffusely increased pulmonary markings suggestive of COPD. Cardiomediastinal contours are unchanged. There is no focal airspace consolidation or pulmonary edema. There is no pneumothorax or sizable pleural effusion. IMPRESSION: 1. COPD. 2. No  focal airspace disease. Electronically Signed   By: Ulyses Jarred M.D.   On: 06/15/2016 23:50      Filed Weights   06/15/16 2336 06/16/16 0200 06/17/16 0325  Weight: 68 kg (150 lb) 66.8 kg (147 lb 4.3 oz) 68.4 kg (150 lb 14.4 oz)     Microbiology: Recent Results (from the past 240 hour(s))  MRSA PCR Screening     Status: None   Collection Time: 06/16/16  2:27 AM  Result Value Ref Range Status   MRSA by PCR NEGATIVE NEGATIVE Final    Comment:        The GeneXpert MRSA Assay (FDA approved for NASAL specimens only), is one component of a comprehensive MRSA colonization surveillance program. It is not intended to diagnose MRSA infection nor to guide or monitor treatment for MRSA infections.   Urine culture     Status: None   Collection Time: 06/16/16 10:14 AM  Result Value Ref Range Status   Specimen Description URINE, RANDOM  Final    Special Requests NONE  Final   Culture NO GROWTH  Final   Report Status 06/17/2016 FINAL  Final       Blood Culture    Component Value Date/Time   SDES URINE, RANDOM 06/16/2016 1014   SPECREQUEST NONE 06/16/2016 1014   CULT NO GROWTH 06/16/2016 1014   REPTSTATUS 06/17/2016 FINAL 06/16/2016 1014      Labs: Results for orders placed or performed during the hospital encounter of 06/15/16 (from the past 48 hour(s))  Urine rapid drug screen (hosp performed)     Status: None   Collection Time: 06/16/16 10:14 AM  Result Value Ref Range   Opiates NONE DETECTED NONE DETECTED   Cocaine NONE DETECTED NONE DETECTED   Benzodiazepines NONE DETECTED NONE DETECTED   Amphetamines NONE DETECTED NONE DETECTED   Tetrahydrocannabinol NONE DETECTED NONE DETECTED   Barbiturates NONE DETECTED NONE DETECTED    Comment:        DRUG SCREEN FOR MEDICAL PURPOSES ONLY.  IF CONFIRMATION IS NEEDED FOR ANY PURPOSE, NOTIFY LAB WITHIN 5 DAYS.        LOWEST DETECTABLE LIMITS FOR URINE DRUG SCREEN Drug Class       Cutoff (ng/mL) Amphetamine      1000 Barbiturate      200 Benzodiazepine   761 Tricyclics       607 Opiates          300 Cocaine          300 THC              50   Urinalysis, Routine w reflex microscopic     Status: Abnormal   Collection Time: 06/16/16 10:14 AM  Result Value Ref Range   Color, Urine YELLOW YELLOW   APPearance CLEAR CLEAR   Specific Gravity, Urine 1.014 1.005 - 1.030   pH 6.0 5.0 - 8.0   Glucose, UA 100 (A) NEGATIVE mg/dL   Hgb urine dipstick NEGATIVE NEGATIVE   Bilirubin Urine NEGATIVE NEGATIVE   Ketones, ur >80 (A) NEGATIVE mg/dL   Protein, ur NEGATIVE NEGATIVE mg/dL   Nitrite NEGATIVE NEGATIVE   Leukocytes, UA NEGATIVE NEGATIVE    Comment: MICROSCOPIC NOT DONE ON URINES WITH NEGATIVE PROTEIN, BLOOD, LEUKOCYTES, NITRITE, OR GLUCOSE <1000 mg/dL.  Urine culture     Status: None   Collection Time: 06/16/16 10:14 AM  Result Value Ref Range   Specimen Description  URINE, RANDOM    Special Requests NONE    Culture  NO GROWTH    Report Status 06/17/2016 FINAL   Sodium, urine, random     Status: None   Collection Time: 06/16/16 10:14 AM  Result Value Ref Range   Sodium, Ur 33 mmol/L  Urea nitrogen, urine     Status: None   Collection Time: 06/16/16 10:14 AM  Result Value Ref Range   Urea Nitrogen, Ur 628 Not Estab. mg/dL    Comment: (NOTE) Performed At: [facility] [place], [place] [id number] Lindon Romp MD BJ:[id number]   Creatinine, urine, random     Status: None   Collection Time: 06/16/16 10:14 AM  Result Value Ref Range   Creatinine, Urine 65.19 mg/dL  Glucose, capillary     Status: Abnormal   Collection Time: 06/16/16 12:23 PM  Result Value Ref Range   Glucose-Capillary 180 (H) 65 - 99 mg/dL  Glucose, capillary     Status: Abnormal   Collection Time: 06/16/16  4:13 PM  Result Value Ref Range   Glucose-Capillary 154 (H) 65 - 99 mg/dL  Glucose, capillary     Status: Abnormal   Collection Time: 06/16/16  9:35 PM  Result Value Ref Range   Glucose-Capillary 168 (H) 65 - 99 mg/dL   Comment 1 Notify RN   Comprehensive metabolic panel     Status: Abnormal   Collection Time: 06/17/16  1:49 AM  Result Value Ref Range   Sodium 135 135 - 145 mmol/L   Potassium 3.3 (L) 3.5 - 5.1 mmol/L   Chloride 102 101 - 111 mmol/L   CO2 25 22 - 32 mmol/L   Glucose, Bld 95 65 - 99 mg/dL   BUN 16 6 - 20 mg/dL   Creatinine, Ser 1.13 0.61 - 1.24 mg/dL   Calcium 8.7 (L) 8.9 - 10.3 mg/dL   Total Protein 5.6 (L) 6.5 - 8.1 g/dL   Albumin 3.4 (L) 3.5 - 5.0 g/dL   AST 40 15 - 41 U/L   ALT 19 17 - 63 U/L   Alkaline Phosphatase 49 38 - 126 U/L   Total Bilirubin 0.8 0.3 - 1.2 mg/dL   GFR calc non Af Amer >60 >60 mL/min   GFR calc Af Amer >60 >60 mL/min    Comment: (NOTE) The eGFR has been calculated using the CKD EPI equation. This calculation has not been validated in all clinical situations. eGFR's persistently <60 mL/min  signify possible Chronic Kidney Disease.    Anion gap 8 5 - 15  CBC     Status: Abnormal   Collection Time: 06/17/16  1:49 AM  Result Value Ref Range   WBC 8.9 4.0 - 10.5 K/uL   RBC 3.94 (L) 4.22 - 5.81 MIL/uL   Hemoglobin 11.7 (L) 13.0 - 17.0 g/dL   HCT 33.7 (L) 39.0 - 52.0 %   MCV 85.5 78.0 - 100.0 fL   MCH 29.7 26.0 - 34.0 pg   MCHC 34.7 30.0 - 36.0 g/dL   RDW 13.6 11.5 - 15.5 %   Platelets 200 150 - 400 K/uL  Glucose, capillary     Status: Abnormal   Collection Time: 06/17/16  8:25 AM  Result Value Ref Range   Glucose-Capillary 107 (H) 65 - 99 mg/dL  Glucose, capillary     Status: Abnormal   Collection Time: 06/17/16 11:45 AM  Result Value Ref Range   Glucose-Capillary 118 (H) 65 - 99 mg/dL  Glucose, capillary     Status: Abnormal   Collection Time: 06/17/16  4:13 PM  Result Value Ref Range   Glucose-Capillary 186 (H) 65 - 99 mg/dL  Glucose, capillary     Status: Abnormal   Collection Time: 06/17/16  9:09 PM  Result Value Ref Range   Glucose-Capillary 106 (H) 65 - 99 mg/dL  Comprehensive metabolic panel     Status: Abnormal   Collection Time: 06/18/16  3:36 AM  Result Value Ref Range   Sodium 135 135 - 145 mmol/L   Potassium 4.2 3.5 - 5.1 mmol/L    Comment: DELTA CHECK NOTED   Chloride 104 101 - 111 mmol/L   CO2 24 22 - 32 mmol/L   Glucose, Bld 97 65 - 99 mg/dL   BUN 10 6 - 20 mg/dL   Creatinine, Ser 0.99 0.61 - 1.24 mg/dL   Calcium 9.1 8.9 - 10.3 mg/dL   Total Protein 5.9 (L) 6.5 - 8.1 g/dL   Albumin 3.6 3.5 - 5.0 g/dL   AST 46 (H) 15 - 41 U/L   ALT 25 17 - 63 U/L   Alkaline Phosphatase 51 38 - 126 U/L   Total Bilirubin 1.2 0.3 - 1.2 mg/dL   GFR calc non Af Amer >60 >60 mL/min   GFR calc Af Amer >60 >60 mL/min    Comment: (NOTE) The eGFR has been calculated using the CKD EPI equation. This calculation has not been validated in all clinical situations. eGFR's persistently <60 mL/min signify possible Chronic Kidney Disease.    Anion gap 7 5 - 15  Glucose,  capillary     Status: None   Collection Time: 06/18/16  7:17 AM  Result Value Ref Range   Glucose-Capillary 93 65 - 99 mg/dL     Lipid Panel     Component Value Date/Time   CHOL 132 10/17/2012 1455   TRIG 196 (H) 10/17/2012 1455   HDL 42 10/17/2012 1455   CHOLHDL 3.1 10/17/2012 1455   VLDL 39 10/17/2012 1455   LDLCALC 51 10/17/2012 1455     Lab Results  Component Value Date   HGBA1C 5.3 06/16/2016   HGBA1C 5.8 (H) 10/17/2012   HGBA1C 5.7 (H) 05/12/2011      HPI :*   Andrew [name] is a 60 y.o. male with medical history significant for hypertension, hyperlipidemia, chronic back pain, and alcohol abuse who presents to the emergency department with tremors, sweats, and nausea. Patient reports that he had been drinking more heavily than usual lately, completing a fifth of vodka daily for the past several days with last drink on the night of 06/14/2016. Several hours prior to his presentation, the patient developed severe tremors and anxiety with diaphoresis and nausea. He describes the symptoms as reminiscent of his prior experiences with alcohol withdrawal.  He endorses worsening in his chronic depression and anxiety, but denies homicidal or suicidal ideation and denies hallucinations. EMS was activated by the patient's brother and Zofran and 400 mL of normal saline was administered en route to the hospital.  ED Course: Upon arrival to the ED, patient is found to be saturating adequately on room air, mildly hypertensive, tachycardic in the low 100s, and with vitals otherwise stable. EKG demonstrates sinus tachycardia with rate 107 and chest x-ray findings are consistent with COPD but there is no acute cardiopulmonary disease. Chemistry panel is notable for a bicarbonate of 9, BUN 27, creatinine 1.72, and anion gap of 35. Last creatinine on file was 1.01 2014. CBC is notable for a leukocytosis to 16,000 and ethanol level is 46. Troponin is undetectable. Patient  was treated with Ativan  and normal saline in the emergency department. Urinalysis and UDS has been ordered, but the patient has yet to urinate. Tachycardia has resolved with IV fluids and the patient reports dramatic improvement in his condition with Ativan. Given the magnitude of the patient's alcohol consumption, as well as his fairly severe withdrawal symptoms despite an elevated alcohol level, he will be admitted to the stepdown unit for ongoing evaluation and management of acute alcohol withdrawal.  HOSPITAL COURSE:    1. Alcohol abuse with acute withdrawal  - Last drink was the night of 06/13/2016; he had been drinking a fifth of vodka daily; denies hx of szr  - EtOH level is 46 on admission Patient maintained on  CIWA protocol,   Refilled  5 more days of Klonopin Patient would benefit from self referral to alcohol rehabilitation  2. Alcoholic ketoacidosis-resolved - Serum bicarb is 9 >18 >25, improving, anion gap 8 - Suspect this to be secondary to alcoholic ketoacidosis, but infectious process not yet excluded  - CXR without infiltrate and UA  negative  3. Kidney disease of uncertain chronicity  - SCr is 1.72 on admission; was 1.0 in 2014, improved with IV fluids, now 1.13 - Continue to hold antihypertensive meds - Renal ultrasound shows a nonobstructing renal stone    4. Leukocytosis - WBC 16,000 on admission with clear CXR and no fever , resolved without any obvious source of infection - No UTI, no pneumonia    5. Hypertension  - Elevated on admission, likely secondary to alcohol withdrawal and medication non-compliance  - Lisinopril and HCTZ held in light kidney disease and dehydration , but now resumed and dose increased due to uncontrolled BP - Continue Coreg as tolerated  - Hydralazine IVP's available prn   6. Depression, anxiety - Pt denies SI, HI, or hallucinations . Motivated to quit drinking - Continue Zoloft; Klonopin held while he is being treated with Ativan for withdrawal  -  Social work consultation requested    Discharge Exam:   Blood pressure (!) 188/106, pulse 88, temperature 98.5 F (36.9 C), temperature source Oral, resp. rate 16, height _0  (1.676 m), weight 68.4 kg (150 lb 14.4 oz), SpO2 99 %. General exam: Appears calm and comfortable  Respiratory system: Clear to auscultation. Respiratory effort normal. Cardiovascular system: S1 & S2 heard, RRR. No JVD, murmurs, rubs, gallops or clicks. No pedal edema. Gastrointestinal system: Abdomen is nondistended, soft and nontender. No organomegaly or masses felt. Normal bowel sounds heard. Central nervous system: Alert and oriented. No focal neurological deficits. Extremities: Symmetric 5 x 5 power. Skin: No rashes, lesions or ulcers Psychiatry: Judgement and insight appear normal. Mood & affect appropriate     Follow-up Information    OSEI-BONSU,GEORGE, MD. Call in 2 day(s).   Specialty:  Internal Medicine Why:  Call PCP to make appointment for hospital follow-up Contact information: [address] [place] [address] [phone]           Signed: Reyne Dumas 06/18/2016, 9:14 AM        Time spent >45 mins

## 2017-01-22 ENCOUNTER — Encounter (HOSPITAL_COMMUNITY): Payer: Self-pay | Admitting: Emergency Medicine

## 2017-01-22 ENCOUNTER — Emergency Department (HOSPITAL_COMMUNITY)
Admission: EM | Admit: 2017-01-22 | Discharge: 2017-01-22 | Disposition: A | Discharge status: Home / Self Care | Payer: Medicare Other | Attending: Emergency Medicine | Admitting: Emergency Medicine

## 2017-01-22 DIAGNOSIS — Z79899 Other long term (current) drug therapy: Secondary | ICD-10-CM | POA: Diagnosis not present

## 2017-01-22 DIAGNOSIS — F1010 Alcohol abuse, uncomplicated: Secondary | ICD-10-CM

## 2017-01-22 DIAGNOSIS — I10 Essential (primary) hypertension: Secondary | ICD-10-CM | POA: Insufficient documentation

## 2017-01-22 DIAGNOSIS — F10120 Alcohol abuse with intoxication, uncomplicated: Secondary | ICD-10-CM | POA: Diagnosis not present

## 2017-01-22 DIAGNOSIS — F17210 Nicotine dependence, cigarettes, uncomplicated: Secondary | ICD-10-CM | POA: Diagnosis not present

## 2017-01-22 DIAGNOSIS — Z7982 Long term (current) use of aspirin: Secondary | ICD-10-CM | POA: Insufficient documentation

## 2017-01-22 LAB — RAPID URINE DRUG SCREEN, HOSP PERFORMED
AMPHETAMINES: NOT DETECTED
BENZODIAZEPINES: NOT DETECTED
Barbiturates: NOT DETECTED
Cocaine: NOT DETECTED
Opiates: NOT DETECTED
Tetrahydrocannabinol: POSITIVE — AB

## 2017-01-22 LAB — CBC WITH DIFFERENTIAL/PLATELET
BASOS PCT: 1 %
Basophils Absolute: 0.1 10*3/uL (ref 0.0–0.1)
EOS ABS: 0.1 10*3/uL (ref 0.0–0.7)
EOS PCT: 1 %
HCT: 43.5 % (ref 39.0–52.0)
Hemoglobin: 15.1 g/dL (ref 13.0–17.0)
Lymphocytes Relative: 23 %
Lymphs Abs: 1.9 10*3/uL (ref 0.7–4.0)
MCH: 31.4 pg (ref 26.0–34.0)
MCHC: 34.7 g/dL (ref 30.0–36.0)
MCV: 90.4 fL (ref 78.0–100.0)
MONO ABS: 0.6 10*3/uL (ref 0.1–1.0)
MONOS PCT: 8 %
Neutro Abs: 5.4 10*3/uL (ref 1.7–7.7)
Neutrophils Relative %: 67 %
PLATELETS: 326 10*3/uL (ref 150–400)
RBC: 4.81 MIL/uL (ref 4.22–5.81)
RDW: 14.3 % (ref 11.5–15.5)
WBC: 8.1 10*3/uL (ref 4.0–10.5)

## 2017-01-22 LAB — COMPREHENSIVE METABOLIC PANEL
ALBUMIN: 4 g/dL (ref 3.5–5.0)
ALT: 102 U/L — ABNORMAL HIGH (ref 17–63)
AST: 97 U/L — ABNORMAL HIGH (ref 15–41)
Alkaline Phosphatase: 76 U/L (ref 38–126)
BILIRUBIN TOTAL: 0.6 mg/dL (ref 0.3–1.2)
BUN: 17 mg/dL (ref 6–20)
CHLORIDE: 100 mmol/L — AB (ref 101–111)
CO2: 20 mmol/L — AB (ref 22–32)
Calcium: 8.5 mg/dL — ABNORMAL LOW (ref 8.9–10.3)
Creatinine, Ser: 0.99 mg/dL (ref 0.61–1.24)
GFR calc Af Amer: >60 mL/min (ref >60)
GFR calc non Af Amer: >60 mL/min (ref >60)
Glucose, Bld: 64 mg/dL — ABNORMAL LOW (ref 65–99)
POTASSIUM: 2.9 mmol/L — AB (ref 3.5–5.1)
Sodium: 142 mmol/L (ref 135–145)
Total Protein: 7.2 g/dL (ref 6.5–8.1)

## 2017-01-22 LAB — ETHANOL: ALCOHOL ETHYL (B): 370 mg/dL — AB (ref <5)

## 2017-01-22 MED ORDER — THIAMINE HCL 100 MG/ML IJ SOLN
Freq: Once | INTRAVENOUS | Status: AC
  Administered 2017-01-22: 12:00:00 via INTRAVENOUS
  Filled 2017-01-22: qty 1000

## 2017-01-22 MED ORDER — POTASSIUM CHLORIDE CRYS ER 20 MEQ PO TBCR
40.0000 meq | EXTENDED_RELEASE_TABLET | Freq: Once | ORAL | Status: AC
  Administered 2017-01-22: 40 meq via ORAL

## 2017-01-22 NOTE — ED Triage Notes (Signed)
Patient here from home with complaints of ETOH. Reports that he has been drinking consistently for the past 3 days. CBG 68.

## 2017-01-22 NOTE — ED Notes (Signed)
PT aware of urine sample 

## 2017-01-22 NOTE — ED Notes (Signed)
PT would not stop drinking water for this writer to take temp

## 2017-01-22 NOTE — Discharge Instructions (Signed)
Should you want help with your drinking, can use the resources provided for you. Can follow-up with your primary care doctor. Return here for any new/worsening symptoms.

## 2017-01-22 NOTE — ED Provider Notes (Signed)
[facility] DEPT Provider Note   CSN: [id number] Arrival date & time: 01/22/17  1058     History   Chief Complaint Chief Complaint  Patient presents with  . Alcohol Intoxication    HPI Andrew [name] is a 61 y.o. male.  The history is provided by the patient and medical records.    61 year old male with history of anxiety, back pain, diabetes, dyslipidemia, GERD, hypertension, history of alcohol abuse, presenting to the ED via [facility] for acute alcohol intoxication.  Patient's brother called [facility] today as patient has been on a 3 day alcohol bender.  Patient states she's not had any abdominal pain, nausea, or vomiting. He does admit to consistently drinking to the point where he cannot remember the last time he was fully sober. States he has no desire to complete detox at this time. Patient does not voice any active complaints on exam.  Past Medical History:  Diagnosis Date  . Anxiety   . Back pain     The patient has chronic lumbar/thoracic back pain, he has kyphosis, mild facet hypertrophy at L1-L2, circumferential annular bulging and mild vertebral body osteophytic formation at L2-L3, mild to moderate stenosis of the medial aspect of the neural foramen at L4-L5, and mild annular bulging at L5-S1.  . Back pain   . Dental caries   . Diabetes mellitus    borderline yrs ago  . Dyslipidemia   . GERD (gastroesophageal reflux disease)   . Glucose intolerance (impaired glucose tolerance)    Max random CBG 130 (08/29/07) typically about 100. A1C in 11/08 was 5.4.  . History of alcohol abuse   . Hyperlipidemia   . Hypertension, essential   . Leg pain   . Peripheral arterial disease ([place])   . Peripheral vascular disease ([place])   . 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 Owens Shark).  Pt will likely require a wide resection and  has been refered to ENT (2/12)  . Thoracic kyphosis   . Tobacco abuse   . Ulcer       Patient Active Problem List   Diagnosis Date Noted  . Alcoholism ([place]) 06/16/2016  . Alcohol withdrawal ([place]) 06/16/2016  . Kidney disease 06/16/2016  . Leukocytosis 06/16/2016  . Alcoholic ketoacidosis 19/37/9024  . S/P BKA (below knee amputation) ([place]) 12/14/2012  . Abnormality of gait 11/10/2012  . Preventative health care 09/15/2010  . [facility] COMPLICATION DUE CORONARY BYPASS GRAFT 06/10/2010  . CHEST PAIN 03/04/2010  . 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 OTHER DENTAL CARIES 03/14/2008  . DYSLIPIDEMIA 05/10/2006  . Depression with anxiety 05/10/2006  . TOBACCO ABUSE 05/10/2006  . Hypertension 05/10/2006  . PERIPHERAL VASCULAR DISEASE 05/10/2006  . BACK PAIN 05/10/2006  . 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 GLUCOSE INTOLERANCE, HX OF 05/10/2006    Past Surgical History:  Procedure Laterality Date  . ABDOMINAL AORTAGRAM N/A 06/11/2012   Procedure: ABDOMINAL Maxcine Ham;  Surgeon: Angelia Mould, MD;  Location: [facility] CATH LAB;  Service: Cardiovascular;  Laterality: N/A;  . AMPUTATION Left 12/06/2012   Procedure: AMPUTATION DIGIT;  Surgeon: Angelia Mould, MD;  Location: [facility];  Service: Vascular;  Laterality: Left;  2ND TOE  . AMPUTATION Left 12/10/2012   Procedure: AMPUTATION BELOW KNEE ;  Surgeon: Rosetta Posner, MD;  Location: [place];  Service: Vascular;  Laterality: Left;  . BYPASS GRAFT FEMORAL-PERONEAL  06/12/2012   Procedure:  BYPASS GRAFT FEMORAL-PERONEAL;  Surgeon: Angelia Mould, MD;  Location: [facility] OR;  Service: Vascular;  Laterality: Left;  Redo fermoral - peroneal artery bypass using     composite propaten 33mm x 80cm and left saphenous vein.  [facility] BYPASS GRAFT FEMORAL-PERONEAL Left 11/10/2012   Procedure: Thrombectomy and Revision Left FEMORAL-PERONEAL Bypass Graft;  Surgeon: Rosetta Posner, MD;  Location: [facility];  Service: Vascular;  Laterality: Left;  . CAROTID ENDARTERECTOMY    . [place]   secondary to a forklift acciednt and  crushed pelvis  . PR VEIN BYPASS GRAFT,AORTO-FEM-POP  2003   Dr. Amedeo Plenty  . PR VEIN BYPASS GRAFT,AORTO-FEM-POP  05/12/11   Left fem-peroneal BPG        Home Medications    Prior to Admission medications   Medication Sig Start Date End Date Taking? Authorizing Provider  aspirin EC 81 MG tablet Take 1 tablet (81 mg total) by mouth daily. 12/21/12   Angiulli, Lavon Paganini, PA-C  carvedilol (COREG) 6.25 MG tablet Take 6.25 mg by mouth 2 (two) times daily with a meal.    [provider]  clonazePAM (KLONOPIN) 1 MG tablet TAKE 0.5 TABLET BY MOUTH 3 TIMES A DAY AS NEEDED FOR ANXIETY 06/18/16   Reyne Dumas, MD  feeding supplement, ENSURE ENLIVE, (ENSURE ENLIVE) LIQD Take 237 mLs by mouth 2 (two) times daily between meals. 06/18/16   Reyne Dumas, MD  fenofibrate (TRICOR) 48 MG tablet Take 48 mg by mouth daily.    [provider]  folic acid (FOLVITE) 1 MG tablet Take 1 tablet (1 mg total) by mouth daily. 06/18/16   Reyne Dumas, MD  gabapentin (NEURONTIN) 300 MG capsule TAKE ONE CAPSULE 3 TIMES A DAY 07/09/14   Kelby Aline, MD  HYDROcodone-acetaminophen [facility]) 10-325 MG per tablet Take 1 tablet by mouth every 4 (four) hours as needed. Fill 30 days after previous script Patient taking differently: Take 1 tablet by mouth every 4 (four) hours as needed for moderate pain. Fill 30 days after previous script 10/14/14   Bartholomew Crews, MD  lisinopril-hydrochlorothiazide (PRINZIDE,ZESTORETIC) 20-25 MG tablet Take 2 tablets by mouth daily. 06/18/16   Reyne Dumas, MD  magnesium oxide (MAG-OX) 400 (241.3 Mg) MG tablet Take 1 tablet (400 mg total) by mouth 2 (two) times daily. 06/18/16   Reyne Dumas, MD  nicotine (NICODERM CQ - DOSED IN MG/24 HOURS) 21 mg/24hr patch Place 1 patch (21 mg total) onto the skin daily. Patient taking differently: Place 21 mg onto the skin daily as needed (smoking sesation).  06/26/13   Hester Mates, MD  pantoprazole (PROTONIX) 40 MG tablet Take 1 tablet  (40 mg total) by mouth daily. 06/18/16   Reyne Dumas, MD  sertraline (ZOLOFT) 100 MG tablet TAKE 1 TABLET BY MOUTH DAILY. 10/08/14   Bartholomew Crews, MD  simvastatin (ZOCOR) 40 MG tablet TAKE 1 TABLET BY MOUTH AT BEDTIME. 07/14/14   Kelby Aline, MD  thiamine 100 MG tablet Take 1 tablet (100 mg total) by mouth daily. 06/18/16   Reyne Dumas, MD  traMADol (ULTRAM) 50 MG tablet Take 1 tablet (50 mg total) by mouth every 6 (six) hours as needed. Patient taking differently: Take 50 mg by mouth every 6 (six) hours as needed for moderate pain.  07/22/14   Sid Falcon, MD  Vitamin D, Ergocalciferol, (DRISDOL) 50000 units CAPS capsule Take 50,000 Units by mouth 2 (two) times a week.    [provider]  Vitamins-Lipotropics (B COMPLEX FORMULA 1 PO) Take 1 tablet by mouth daily.      [provider]    Family History Family History  Problem Relation Age of Onset  . Heart disease Mother   . Cancer Mother   . Alcohol abuse Father   . Heart disease Father   . Stroke Father     Social History Social History  Substance Use Topics  . Smoking status: Current Every Day Smoker    Packs/day: 0.50    Years: 40.00    Types: Cigarettes  . Smokeless tobacco: Never Used     Comment: trying to quit / 1PPD  . Alcohol use Yes     Comment: 1/5 a day     Allergies   Patient has no known allergies.   Review of Systems Review of Systems  Constitutional:       EtOH  All other systems reviewed and are negative.    Physical Exam Updated Vital Signs BP 127/80   Pulse (!) 106   Resp 17   SpO2 98%   Physical Exam  Constitutional: He is oriented to person, place, and time. He appears well-developed and well-nourished.  Somewhat disheveled appearing, smells of alcohol  HENT:  Head: Normocephalic and atraumatic.  Mouth/Throat: Oropharynx is clear and moist.  Generally poor dentition  Eyes: Conjunctivae and EOM are normal. Pupils are equal, round, and reactive to  light.  Neck: Normal range of motion.  Cardiovascular: Normal rate, regular rhythm and normal heart sounds.   Pulmonary/Chest: Effort normal and breath sounds normal. No respiratory distress. He has no wheezes.  Abdominal: Soft. Bowel sounds are normal. There is no tenderness. There is no rebound.  Musculoskeletal: Normal range of motion.  Left BKA  Neurological: He is alert and oriented to person, place, and time.  Awake, alert, oriented 3, speech is mildly slurred but thoughts are cohesive and sensical, goal oriented, no tremors or seizure activity  Skin: Skin is warm and dry.  Psychiatric: He has a normal mood and affect.  Nursing note and vitals reviewed.    ED Treatments / Results  Labs (all labs ordered are listed, but only abnormal results are displayed) Labs Reviewed  COMPREHENSIVE METABOLIC PANEL - Abnormal; Notable for the following:       Result Value   Potassium 2.9 (*)    Chloride 100 (*)    CO2 20 (*)    Glucose, Bld 64 (*)    Calcium 8.5 (*)    AST 97 (*)    ALT 102 (*)    All other components within normal limits  ETHANOL - Abnormal; Notable for the following:    Alcohol, Ethyl (B) 370 (*)    All other components within normal limits  RAPID URINE DRUG SCREEN, HOSP PERFORMED - Abnormal; Notable for the following:    Tetrahydrocannabinol POSITIVE (*)    All other components within normal limits  CBC WITH DIFFERENTIAL/PLATELET    EKG  EKG Interpretation None       Radiology No results found.  Procedures Procedures (including critical care time)  Medications Ordered in ED Medications  sodium chloride 0.9 % 1,000 mL with thiamine 938 mg, folic acid 1 mg, multivitamins adult 10 mL infusion ( Intravenous New Bag/Given 01/22/17 1146)  potassium chloride SA (K-DUR,KLOR-CON) CR tablet 40 mEq (40 mEq Oral Given 01/22/17 1218)     Initial Impression / Assessment and Plan / ED Course  I have reviewed the triage vital signs and the nursing  notes.  Pertinent labs & imaging results that were available during my care of the patient were reviewed by me and considered in my medical decision making (see chart for details).  61 year old male here acutely intoxicated. EMS was called by patient's brother. Patient has no active complaints here. His vitals are stable. He has required admission for alcoholic ketoacidosis in the past. We'll plan for basic labs. Patient given banana bag.  Patient denies desire for detox currently.  2:08 PM Patient remains stable here.  Has been observed for a few hours.  Labs did reveal a hypokalemia at 2.9 which was replaced here. Ethanol elevated at 370. Patient remains alert and oriented. Speech remains goal oriented and sent goal. He has been able to eat full meal here. He continues to deny any complaints. He again voiced that he does not want detox. No signs or symptoms concerning for acute withdrawal at this time. I do not see any acute need for hospitalization at this point and given that patient is declining desire for detox and has no active other complaints, will call family to come pick him up.  3:45 PM Have attempted to contacts patient's family without response.  Will attempt to get transport home via [facility].  Care will be signed out to oncoming team to follow-up on transport home.  Final Clinical Impressions(s) / ED Diagnoses   Final diagnoses:  Alcohol abuse    New Prescriptions New Prescriptions   No medications on file     Larene Pickett, PA-C 01/22/17 1545    Tanna Furry, MD 01/23/17 [phone]

## 2017-01-22 NOTE — ED Notes (Signed)
Bed: WA25 Expected date:  Expected time:  Means of arrival:  Comments: EMS - hyperglycemia 

## 2017-06-21 ENCOUNTER — Encounter (HOSPITAL_COMMUNITY): Payer: Self-pay | Admitting: Emergency Medicine

## 2017-06-21 ENCOUNTER — Emergency Department (HOSPITAL_COMMUNITY)
Admission: EM | Admit: 2017-06-21 | Discharge: 2017-06-21 | Disposition: A | Discharge status: Home / Self Care | Payer: Medicare Other | Attending: Emergency Medicine | Admitting: Emergency Medicine

## 2017-06-21 ENCOUNTER — Emergency Department (HOSPITAL_COMMUNITY): Payer: Medicare Other

## 2017-06-21 DIAGNOSIS — E119 Type 2 diabetes mellitus without complications: Secondary | ICD-10-CM | POA: Insufficient documentation

## 2017-06-21 DIAGNOSIS — R531 Weakness: Secondary | ICD-10-CM | POA: Diagnosis present

## 2017-06-21 DIAGNOSIS — Z951 Presence of aortocoronary bypass graft: Secondary | ICD-10-CM | POA: Diagnosis not present

## 2017-06-21 DIAGNOSIS — Z79899 Other long term (current) drug therapy: Secondary | ICD-10-CM | POA: Insufficient documentation

## 2017-06-21 DIAGNOSIS — I10 Essential (primary) hypertension: Secondary | ICD-10-CM | POA: Diagnosis not present

## 2017-06-21 DIAGNOSIS — F10920 Alcohol use, unspecified with intoxication, uncomplicated: Secondary | ICD-10-CM | POA: Insufficient documentation

## 2017-06-21 DIAGNOSIS — Z7982 Long term (current) use of aspirin: Secondary | ICD-10-CM | POA: Insufficient documentation

## 2017-06-21 DIAGNOSIS — Z9114 Patient's other noncompliance with medication regimen: Secondary | ICD-10-CM

## 2017-06-21 DIAGNOSIS — E876 Hypokalemia: Secondary | ICD-10-CM | POA: Diagnosis not present

## 2017-06-21 DIAGNOSIS — J189 Pneumonia, unspecified organism: Secondary | ICD-10-CM | POA: Insufficient documentation

## 2017-06-21 DIAGNOSIS — F17210 Nicotine dependence, cigarettes, uncomplicated: Secondary | ICD-10-CM | POA: Diagnosis not present

## 2017-06-21 DIAGNOSIS — F1010 Alcohol abuse, uncomplicated: Secondary | ICD-10-CM

## 2017-06-21 LAB — CBC WITH DIFFERENTIAL/PLATELET
Basophils Absolute: 0 10*3/uL (ref 0.0–0.1)
Basophils Relative: 0 %
EOS ABS: 0.1 10*3/uL (ref 0.0–0.7)
EOS PCT: 1 %
HCT: 38.6 % — ABNORMAL LOW (ref 39.0–52.0)
Hemoglobin: 12.9 g/dL — ABNORMAL LOW (ref 13.0–17.0)
LYMPHS ABS: 1.2 10*3/uL (ref 0.7–4.0)
LYMPHS PCT: 17 %
MCH: 30.6 pg (ref 26.0–34.0)
MCHC: 33.4 g/dL (ref 30.0–36.0)
MCV: 91.5 fL (ref 78.0–100.0)
MONO ABS: 0.5 10*3/uL (ref 0.1–1.0)
Monocytes Relative: 7 %
Neutro Abs: 5.6 10*3/uL (ref 1.7–7.7)
Neutrophils Relative %: 75 %
PLATELETS: 237 10*3/uL (ref 150–400)
RBC: 4.22 MIL/uL (ref 4.22–5.81)
RDW: 15.8 % — AB (ref 11.5–15.5)
WBC: 7.5 10*3/uL (ref 4.0–10.5)

## 2017-06-21 LAB — COMPREHENSIVE METABOLIC PANEL
ALK PHOS: 77 U/L (ref 38–126)
ALT: 24 U/L (ref 17–63)
ANION GAP: 20 — AB (ref 5–15)
AST: 46 U/L — ABNORMAL HIGH (ref 15–41)
Albumin: 3.5 g/dL (ref 3.5–5.0)
BILIRUBIN TOTAL: 0.9 mg/dL (ref 0.3–1.2)
BUN: 16 mg/dL (ref 6–20)
CO2: 17 mmol/L — ABNORMAL LOW (ref 22–32)
CREATININE: 1 mg/dL (ref 0.61–1.24)
Calcium: 8.4 mg/dL — ABNORMAL LOW (ref 8.9–10.3)
Chloride: 103 mmol/L (ref 101–111)
GFR calc non Af Amer: >60 mL/min (ref >60)
GLUCOSE: 77 mg/dL (ref 65–99)
Potassium: 2.9 mmol/L — ABNORMAL LOW (ref 3.5–5.1)
SODIUM: 140 mmol/L (ref 135–145)
TOTAL PROTEIN: 6.6 g/dL (ref 6.5–8.1)

## 2017-06-21 LAB — RAPID URINE DRUG SCREEN, HOSP PERFORMED
Amphetamines: NOT DETECTED
Barbiturates: NOT DETECTED
Benzodiazepines: NOT DETECTED
COCAINE: NOT DETECTED
OPIATES: NOT DETECTED
Tetrahydrocannabinol: NOT DETECTED

## 2017-06-21 LAB — LIPASE, BLOOD: Lipase: 21 U/L (ref 11–51)

## 2017-06-21 LAB — ETHANOL: ALCOHOL ETHYL (B): 291 mg/dL — AB (ref <10)

## 2017-06-21 LAB — URINALYSIS, ROUTINE W REFLEX MICROSCOPIC
Bilirubin Urine: NEGATIVE
GLUCOSE, UA: NEGATIVE mg/dL
Hgb urine dipstick: NEGATIVE
Ketones, ur: 20 mg/dL — AB
LEUKOCYTES UA: NEGATIVE
Nitrite: NEGATIVE
Protein, ur: NEGATIVE mg/dL
SPECIFIC GRAVITY, URINE: 1.013 (ref 1.005–1.030)
pH: 6 (ref 5.0–8.0)

## 2017-06-21 LAB — MAGNESIUM: Magnesium: 1.5 mg/dL — ABNORMAL LOW (ref 1.7–2.4)

## 2017-06-21 MED ORDER — POTASSIUM CHLORIDE ER 20 MEQ PO TBCR: 20.0000 meq | EXTENDED_RELEASE_TABLET | Freq: Every day | ORAL | 0 refills | Status: DC

## 2017-06-21 MED ORDER — DEXTROSE 5 % IV SOLN
1.0000 g | Freq: Once | INTRAVENOUS | Status: AC
  Administered 2017-06-21: 1 g via INTRAVENOUS
  Filled 2017-06-21: qty 10

## 2017-06-21 MED ORDER — LORAZEPAM 2 MG/ML IJ SOLN
1.0000 mg | Freq: Once | INTRAMUSCULAR | Status: AC
  Administered 2017-06-21: 1 mg via INTRAVENOUS
  Filled 2017-06-21: qty 1

## 2017-06-21 MED ORDER — POTASSIUM CHLORIDE CRYS ER 20 MEQ PO TBCR
40.0000 meq | EXTENDED_RELEASE_TABLET | Freq: Once | ORAL | Status: AC
  Administered 2017-06-21: 40 meq via ORAL
  Filled 2017-06-21: qty 2

## 2017-06-21 MED ORDER — CHLORDIAZEPOXIDE HCL 25 MG PO CAPS: ORAL_CAPSULE | ORAL | 0 refills | Status: DC

## 2017-06-21 MED ORDER — CHLORDIAZEPOXIDE HCL 25 MG PO CAPS
25.0000 mg | ORAL_CAPSULE | Freq: Once | ORAL | Status: AC
  Administered 2017-06-21: 25 mg via ORAL
  Filled 2017-06-21: qty 1

## 2017-06-21 MED ORDER — SIMVASTATIN 40 MG PO TABS: 40.0000 mg | ORAL_TABLET | Freq: Every day | ORAL | 0 refills | Status: DC

## 2017-06-21 MED ORDER — FOLIC ACID 1 MG PO TABS: 1.0000 mg | ORAL_TABLET | Freq: Every day | ORAL | 0 refills | Status: DC

## 2017-06-21 MED ORDER — MAGNESIUM SULFATE 2 GM/50ML IV SOLN
2.0000 g | Freq: Once | INTRAVENOUS | Status: AC
  Administered 2017-06-21: 2 g via INTRAVENOUS
  Filled 2017-06-21: qty 50

## 2017-06-21 MED ORDER — AEROCHAMBER PLUS FLO-VU MEDIUM MISC
1.0000 | Freq: Once | Status: AC
  Administered 2017-06-21: 1
  Filled 2017-06-21: qty 1

## 2017-06-21 MED ORDER — SODIUM CHLORIDE 0.9 % IV BOLUS (SEPSIS)
1000.0000 mL | Freq: Once | INTRAVENOUS | Status: AC
  Administered 2017-06-21: 1000 mL via INTRAVENOUS

## 2017-06-21 MED ORDER — ALBUTEROL SULFATE HFA 108 (90 BASE) MCG/ACT IN AERS
1.0000 | INHALATION_SPRAY | RESPIRATORY_TRACT | Status: DC | PRN
  Filled 2017-06-21: qty 6.7

## 2017-06-21 MED ORDER — MAGNESIUM OXIDE 400 (241.3 MG) MG PO TABS: 400.0000 mg | ORAL_TABLET | Freq: Two times a day (BID) | ORAL | 0 refills | Status: DC

## 2017-06-21 MED ORDER — THIAMINE HCL 100 MG PO TABS: 100.0000 mg | ORAL_TABLET | Freq: Every day | ORAL | 0 refills | Status: DC

## 2017-06-21 MED ORDER — SERTRALINE HCL 100 MG PO TABS: 100.0000 mg | ORAL_TABLET | Freq: Every day | ORAL | 0 refills | Status: DC

## 2017-06-21 MED ORDER — AZITHROMYCIN 250 MG PO TABS
500.0000 mg | ORAL_TABLET | Freq: Once | ORAL | Status: AC
  Administered 2017-06-21: 500 mg via ORAL
  Filled 2017-06-21: qty 2

## 2017-06-21 MED ORDER — AZITHROMYCIN 250 MG PO TABS: ORAL_TABLET | ORAL | 0 refills | Status: DC

## 2017-06-21 MED ORDER — PANTOPRAZOLE SODIUM 40 MG PO TBEC: 40.0000 mg | DELAYED_RELEASE_TABLET | Freq: Every day | ORAL | 0 refills | Status: DC

## 2017-06-21 NOTE — ED Provider Notes (Signed)
[facility] DEPT Provider Note   CSN: [id number] Arrival date & time: 06/21/17  0749     History   Chief Complaint Chief Complaint  Patient presents with  . Alcohol Intoxication    HPI Andrew [name] is a 61 y.o. male.  Pt presents to the ED today with general malaise.  He has a hx of alcohol abuse, and has been drinking heavily for days.  He has not been drinking anything else or eating.  The pt feels like his nerves are "shot."  He has tremors.  The pt was admitted last November for alcoholic ketoacidosis.  Since then, he has not followed up with his doctor.  He has not taken any of his medications.  Pt lives with his brother and his brother's family in an addition to the house.  He told the nurse he wants rehab.          Past Medical History:  Diagnosis Date  . Anxiety   . Back pain     The patient has chronic lumbar/thoracic back pain, he has kyphosis, mild facet hypertrophy at L1-L2, circumferential annular bulging and mild vertebral body osteophytic formation at L2-L3, mild to moderate stenosis of the medial aspect of the neural foramen at L4-L5, and mild annular bulging at L5-S1.  . Back pain   . Dental caries   . Diabetes mellitus    borderline yrs ago  . Dyslipidemia   . GERD (gastroesophageal reflux disease)   . Glucose intolerance (impaired glucose tolerance)    Max random CBG 130 (08/29/07) typically about 100. A1C in 11/08 was 5.4.  . History of alcohol abuse   . Hyperlipidemia   . Hypertension, essential   . Leg pain   . Peripheral arterial disease ([place])   . Peripheral vascular disease ([place])   . 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 Owens Shark).  Pt will likely require a wide resection and  has been refered to ENT (2/12)  . Thoracic kyphosis   . Tobacco abuse   . Ulcer     Patient Active Problem List   Diagnosis Date Noted  . Alcoholism  ([place]) 06/16/2016  . Alcohol withdrawal ([place]) 06/16/2016  . Kidney disease 06/16/2016  . Leukocytosis 06/16/2016  . Alcoholic ketoacidosis 82/50/5397  . S/P BKA (below knee amputation) ([place]) 12/14/2012  . Abnormality of gait 11/10/2012  . Preventative health care 09/15/2010  . [facility] COMPLICATION DUE CORONARY BYPASS GRAFT 06/10/2010  . CHEST PAIN 03/04/2010  . 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 OTHER DENTAL CARIES 03/14/2008  . DYSLIPIDEMIA 05/10/2006  . Depression with anxiety 05/10/2006  . TOBACCO ABUSE 05/10/2006  . Hypertension 05/10/2006  . PERIPHERAL VASCULAR DISEASE 05/10/2006  . BACK PAIN 05/10/2006  . 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 GLUCOSE INTOLERANCE, HX OF 05/10/2006    Past Surgical History:  Procedure Laterality Date  . CAROTID ENDARTERECTOMY    . [place]   secondary to a forklift acciednt and crushed pelvis  . PR VEIN BYPASS GRAFT,AORTO-FEM-POP  2003   Dr. Amedeo Plenty  . PR VEIN BYPASS GRAFT,AORTO-FEM-POP  05/12/11   Left fem-peroneal BPG        Home Medications    Prior to Admission medications   Medication Sig Start Date End Date Taking? Authorizing Provider  aspirin EC 81 MG tablet Take 1 tablet (81 mg total) by mouth daily. 12/21/12  Yes Angiulli, Lavon Paganini, PA-C  Vitamins-Lipotropics (B COMPLEX FORMULA 1 PO) Take 1 tablet by mouth daily.     Yes [provider]  azithromycin (ZITHROMAX) 250 MG tablet Take 1 tab daily 06/22/17   Isla Pence, MD  chlordiazePOXIDE (LIBRIUM) 25 MG capsule 50mg  PO TID x 1D, then 25-50mg  PO BID X 1D, then 25-50mg  PO QD X 1D 06/21/17   Isla Pence, MD  clonazePAM (KLONOPIN) 1 MG tablet TAKE 0.5 TABLET BY MOUTH 3 TIMES A DAY AS NEEDED FOR ANXIETY Patient not taking: Reported on 06/21/2017 06/18/16   Reyne Dumas, MD  feeding supplement, ENSURE ENLIVE, (ENSURE ENLIVE) LIQD Take 237 mLs by mouth 2 (two) times daily between meals. Patient not taking: Reported on 06/21/2017 06/18/16    Reyne Dumas, MD  folic acid (FOLVITE) 1 MG tablet Take 1 tablet (1 mg total) daily by mouth. 06/21/17   Isla Pence, MD  gabapentin (NEURONTIN) 300 MG capsule TAKE ONE CAPSULE 3 TIMES A DAY Patient not taking: Reported on 06/21/2017 07/09/14   Kelby Aline, MD  HYDROcodone-acetaminophen [facility]) 10-325 MG per tablet Take 1 tablet by mouth every 4 (four) hours as needed. Fill 30 days after previous script Patient not taking: Reported on 06/21/2017 10/14/14   Bartholomew Crews, MD  lisinopril-hydrochlorothiazide (PRINZIDE,ZESTORETIC) 20-25 MG tablet Take 2 tablets by mouth daily. Patient not taking: Reported on 06/21/2017 06/18/16   Reyne Dumas, MD  magnesium oxide (MAG-OX) 400 (241.3 Mg) MG tablet Take 1 tablet (400 mg total) 2 (two) times daily by mouth. 06/21/17   Isla Pence, MD  nicotine (NICODERM CQ - DOSED IN MG/24 HOURS) 21 mg/24hr patch Place 1 patch (21 mg total) onto the skin daily. Patient not taking: Reported on 06/21/2017 06/26/13   Hester Mates, MD  pantoprazole (PROTONIX) 40 MG tablet Take 1 tablet (40 mg total) daily by mouth. 06/21/17   Isla Pence, MD  potassium chloride 20 MEQ TBCR Take 20 mEq daily by mouth. 06/21/17   Isla Pence, MD  sertraline (ZOLOFT) 100 MG tablet Take 1 tablet (100 mg total) daily by mouth. 06/21/17   Isla Pence, MD  simvastatin (ZOCOR) 40 MG tablet Take 1 tablet (40 mg total) at bedtime by mouth. 06/21/17   Isla Pence, MD  thiamine 100 MG tablet Take 1 tablet (100 mg total) daily by mouth. 06/21/17   Isla Pence, MD  traMADol (ULTRAM) 50 MG tablet Take 1 tablet (50 mg total) by mouth every 6 (six) hours as needed. Patient not taking: Reported on 06/21/2017 07/22/14   Sid Falcon, MD    Family History Family History  Problem Relation Age of Onset  . Heart disease Mother   . Cancer Mother   . Alcohol abuse Father   . Heart disease Father   . Stroke Father     Social History Social History   Tobacco Use    . Smoking status: Current Every Day Smoker    Packs/day: 0.50    Years: 40.00    Pack years: 20.00    Types: Cigarettes  . Smokeless tobacco: Never Used  . Tobacco comment: trying to quit / 1PPD  Substance Use Topics  . Alcohol use: Yes    Comment: 1/5 a day  . Drug use: No     Allergies   Patient has no known allergies.   Review of Systems Review of Systems  Constitutional: Positive for fatigue.  Neurological: Positive for tremors and weakness.  Psychiatric/Behavioral: The patient is nervous/anxious.   All  other systems reviewed and are negative.    Physical Exam Updated Vital Signs BP (!) 162/85   Pulse (!) 103   Temp 97.7 F (36.5 C) (Oral)   Resp 18   SpO2 96%   Physical Exam  Constitutional: He is oriented to person, place, and time. He appears well-developed and well-nourished.  HENT:  Head: Normocephalic and atraumatic.  Right Ear: External ear normal.  Left Ear: External ear normal.  Nose: Nose normal.  Mouth/Throat: Mucous membranes are dry.  Eyes: Conjunctivae and EOM are normal. Pupils are equal, round, and reactive to light.  Neck: Normal range of motion. Neck supple.  Cardiovascular: Normal rate, regular rhythm, normal heart sounds and intact distal pulses.  Pulmonary/Chest: Effort normal and breath sounds normal.  Abdominal: Soft. Bowel sounds are normal. There is tenderness in the epigastric area.  Musculoskeletal: Normal range of motion.  Left bka  Neurological: He is alert and oriented to person, place, and time. He displays tremor.  Skin: Skin is warm and dry. Capillary refill takes less than 2 seconds.  Psychiatric: His mood appears anxious.  Nursing note and vitals reviewed.    ED Treatments / Results  Labs (all labs ordered are listed, but only abnormal results are displayed) Labs Reviewed  CBC WITH DIFFERENTIAL/PLATELET - Abnormal; Notable for the following components:      Result Value   Hemoglobin 12.9 (*)    HCT 38.6 (*)     RDW 15.8 (*)    All other components within normal limits  COMPREHENSIVE METABOLIC PANEL - Abnormal; Notable for the following components:   Potassium 2.9 (*)    CO2 17 (*)    Calcium 8.4 (*)    AST 46 (*)    Anion gap 20 (*)    All other components within normal limits  URINALYSIS, ROUTINE W REFLEX MICROSCOPIC - Abnormal; Notable for the following components:   Ketones, ur 20 (*)    All other components within normal limits  ETHANOL - Abnormal; Notable for the following components:   Alcohol, Ethyl (B) 291 (*)    All other components within normal limits  MAGNESIUM - Abnormal; Notable for the following components:   Magnesium 1.5 (*)    All other components within normal limits  LIPASE, BLOOD  RAPID URINE DRUG SCREEN, HOSP PERFORMED    EKG  EKG Interpretation  Date/Time:  Wednesday June 21 2017 08:39:36 EST Ventricular Rate:  90 PR Interval:    QRS Duration: 117 QT Interval:  519 QTC Calculation: 636 R Axis:   76 Text Interpretation:  Sinus rhythm Short PR interval Left ventricular hypertrophy Borderline T abnormalities, diffuse leads Prolonged QT interval No significant change since last tracing Confirmed by Isla Pence [phone]) on 06/21/2017 8:43:38 AM       Radiology Dg Chest 2 View  Result Date: 06/21/2017 CLINICAL DATA:  Generalized malaise. Chronic cough. Smoker. Patient not eating or drinking anything but alcohol. EXAM: CHEST  2 VIEW COMPARISON:  06/16/2016. FINDINGS: Normal cardiomediastinal silhouette. BILATERAL lower lobe opacities are new from priors, and could represent developing lower lobe pneumonia. No visible hilar or mediastinal adenopathy. No effusion or pneumothorax. Bones appear osteopenic but unchanged. IMPRESSION: BILATERAL lower lobe opacities could represent developing pneumonia. Continued surveillance warranted. Electronically Signed   By: Staci Righter M.D.   On: 06/21/2017 15:31    Procedures Procedures (including critical care  time)  Medications Ordered in ED Medications  albuterol (PROVENTIL HFA;VENTOLIN HFA) 108 (90 Base) MCG/ACT inhaler 1-2 puff (not administered)  AEROCHAMBER PLUS FLO-VU MEDIUM MISC 1 each (not administered)  cefTRIAXone (ROCEPHIN) 1 g in dextrose 5 % 50 mL IVPB (not administered)  azithromycin (ZITHROMAX) tablet 500 mg (not administered)  chlordiazePOXIDE (LIBRIUM) capsule 25 mg (not administered)  sodium chloride 0.9 % bolus 1,000 mL (0 mLs Intravenous Stopped 06/21/17 1139)  LORazepam (ATIVAN) injection 1 mg (1 mg Intravenous Given 06/21/17 0835)  sodium chloride 0.9 % bolus 1,000 mL (0 mLs Intravenous Stopped 06/21/17 1139)  potassium chloride SA (K-DUR,KLOR-CON) CR tablet 40 mEq (40 mEq Oral Given 06/21/17 1039)  magnesium sulfate IVPB 2 g 50 mL (0 g Intravenous Stopped 06/21/17 1356)  sodium chloride 0.9 % bolus 1,000 mL (0 mLs Intravenous Stopped 06/21/17 1300)     Initial Impression / Assessment and Plan / ED Course  I have reviewed the triage vital signs and the nursing notes.  Pertinent labs & imaging results that were available during my care of the patient were reviewed by me and considered in my medical decision making (see chart for details).     Pt has much improved.  He has eaten multiple times while here.  Oxygen sats ok.  He requests medication refills.   The pt will be given rocephin/zithromax and albuterol prior to discharge.  He is instructed to f/u with pcp.  He knows to return if worse.  Final Clinical Impressions(s) / ED Diagnoses   Final diagnoses:  Hypokalemia  Alcoholic intoxication without complication ([place])  Alcohol abuse  Hypomagnesemia  Pneumonia of both lungs due to infectious organism, unspecified part of lung  Noncompliance with medications    ED Discharge Orders        Ordered    azithromycin (ZITHROMAX) 250 MG tablet     09/81/19 1478    folic acid (FOLVITE) 1 MG tablet  Daily     06/21/17 1541    magnesium oxide (MAG-OX) 400 (241.3 Mg)  MG tablet  2 times daily     06/21/17 1541    pantoprazole (PROTONIX) 40 MG tablet  Daily     06/21/17 1541    simvastatin (ZOCOR) 40 MG tablet  Daily at bedtime     06/21/17 1541    thiamine 100 MG tablet  Daily     06/21/17 1541    sertraline (ZOLOFT) 100 MG tablet  Daily     06/21/17 1541    chlordiazePOXIDE (LIBRIUM) 25 MG capsule     06/21/17 1542    potassium chloride 20 MEQ TBCR  Daily     06/21/17 1542       Isla Pence, MD 06/21/17 1546

## 2017-06-21 NOTE — ED Notes (Signed)
Patient transported to X-ray 

## 2017-06-21 NOTE — ED Notes (Signed)
Bed: [facility]ALB Expected date:  Expected time:  Means of arrival:  Comments: 

## 2017-06-21 NOTE — ED Triage Notes (Signed)
Per EMS, pt is coming from home with complaints of general malaise. Pt reports not eating or drinking anything but alcohol the last two days. Pt reports last drink being right before calling EMS. Pt has a BKA on the left. Pt is AOx4. Pt is seeking placement for rehabilitation.

## 2017-06-21 NOTE — ED Notes (Signed)
Bed: WA25 Expected date:  Expected time:  Means of arrival:  Comments: EMS ETOH 

## 2017-06-21 NOTE — ED Notes (Signed)
[facility] here to transport pt back to his home. 

## 2017-06-21 NOTE — ED Notes (Signed)
ED Provider at bedside. 

## 2017-06-21 NOTE — ED Notes (Signed)
[facility] has been notified and will send a unit to pick up pt and drop off at home residence when available.

## 2017-06-21 NOTE — ED Notes (Signed)
Made contact with pts brother. Brother said he is not coming to pick pt up.

## 2017-06-21 NOTE — ED Notes (Signed)
Made contact with pt younger brother for pick up.

## 2017-06-23 ENCOUNTER — Other Ambulatory Visit: Payer: Self-pay

## 2017-06-23 ENCOUNTER — Encounter (HOSPITAL_COMMUNITY): Payer: Self-pay | Admitting: *Deleted

## 2017-06-23 ENCOUNTER — Emergency Department (HOSPITAL_COMMUNITY)
Admission: EM | Admit: 2017-06-23 | Discharge: 2017-06-23 | Disposition: A | Discharge status: Home / Self Care | Payer: Medicare Other | Attending: Emergency Medicine | Admitting: Emergency Medicine

## 2017-06-23 DIAGNOSIS — I10 Essential (primary) hypertension: Secondary | ICD-10-CM | POA: Insufficient documentation

## 2017-06-23 DIAGNOSIS — F17210 Nicotine dependence, cigarettes, uncomplicated: Secondary | ICD-10-CM | POA: Insufficient documentation

## 2017-06-23 DIAGNOSIS — E119 Type 2 diabetes mellitus without complications: Secondary | ICD-10-CM | POA: Insufficient documentation

## 2017-06-23 DIAGNOSIS — Z85828 Personal history of other malignant neoplasm of skin: Secondary | ICD-10-CM | POA: Insufficient documentation

## 2017-06-23 DIAGNOSIS — F10920 Alcohol use, unspecified with intoxication, uncomplicated: Secondary | ICD-10-CM | POA: Diagnosis not present

## 2017-06-23 DIAGNOSIS — F10929 Alcohol use, unspecified with intoxication, unspecified: Secondary | ICD-10-CM | POA: Diagnosis present

## 2017-06-23 DIAGNOSIS — Z79899 Other long term (current) drug therapy: Secondary | ICD-10-CM | POA: Insufficient documentation

## 2017-06-23 LAB — CBC WITH DIFFERENTIAL/PLATELET
BASOS PCT: 1 %
Basophils Absolute: 0 10*3/uL (ref 0.0–0.1)
EOS ABS: 0.1 10*3/uL (ref 0.0–0.7)
Eosinophils Relative: 2 %
HCT: 37.4 % — ABNORMAL LOW (ref 39.0–52.0)
HEMOGLOBIN: 12.5 g/dL — AB (ref 13.0–17.0)
Lymphocytes Relative: 33 %
Lymphs Abs: 1.6 10*3/uL (ref 0.7–4.0)
MCH: 30.5 pg (ref 26.0–34.0)
MCHC: 33.4 g/dL (ref 30.0–36.0)
MCV: 91.2 fL (ref 78.0–100.0)
MONOS PCT: 8 %
Monocytes Absolute: 0.4 10*3/uL (ref 0.1–1.0)
NEUTROS PCT: 56 %
Neutro Abs: 2.8 10*3/uL (ref 1.7–7.7)
Platelets: 164 10*3/uL (ref 150–400)
RBC: 4.1 MIL/uL — ABNORMAL LOW (ref 4.22–5.81)
RDW: 16.4 % — AB (ref 11.5–15.5)
WBC: 5 10*3/uL (ref 4.0–10.5)

## 2017-06-23 LAB — COMPREHENSIVE METABOLIC PANEL
ALBUMIN: 3.6 g/dL (ref 3.5–5.0)
ALK PHOS: 71 U/L (ref 38–126)
ALT: 24 U/L (ref 17–63)
ANION GAP: 13 (ref 5–15)
AST: 49 U/L — ABNORMAL HIGH (ref 15–41)
BUN: 6 mg/dL (ref 6–20)
CO2: 24 mmol/L (ref 22–32)
Calcium: 8.8 mg/dL — ABNORMAL LOW (ref 8.9–10.3)
Chloride: 106 mmol/L (ref 101–111)
Creatinine, Ser: 0.99 mg/dL (ref 0.61–1.24)
GFR calc Af Amer: >60 mL/min (ref >60)
GFR calc non Af Amer: >60 mL/min (ref >60)
GLUCOSE: 101 mg/dL — AB (ref 65–99)
POTASSIUM: 3.1 mmol/L — AB (ref 3.5–5.1)
SODIUM: 143 mmol/L (ref 135–145)
Total Bilirubin: 0.5 mg/dL (ref 0.3–1.2)
Total Protein: 6.3 g/dL — ABNORMAL LOW (ref 6.5–8.1)

## 2017-06-23 NOTE — ED Notes (Signed)
ED Provider at bedside. 

## 2017-06-23 NOTE — ED Notes (Addendum)
Patient called out as Probation officer was walking past room. Writer asked patient "how can I help you?" Patient responds with "Get me a cup of God Da** ice water".

## 2017-06-23 NOTE — ED Notes (Signed)
RN attempted to call both of Pt's emergency contacts. RN reviewed d/c paperwork with pt and then pt refused to leave. Pt refused to sign d/c signature pad.  RN called security who assisted pt out

## 2017-06-23 NOTE — ED Triage Notes (Signed)
Arrived per EMS, from home. Patient reports that he had been drinking ETOH today, and started shaking. VVS per EMS 180/98. HR 94, CBG 117. Reports last drink of ETOH was around 1700

## 2017-06-23 NOTE — ED Notes (Addendum)
Pt screaming and cussing and throwing things in his room. Pt states to nurse "I haven't seen a godamn nurse since I got here and I just want to fucking leave"  Pt was informed by RN that she was in the room when he got there and she assessed him and he said "I don't remember you." pt was told that he was rather drunk and that for his safety he should try to relax

## 2017-06-23 NOTE — ED Notes (Signed)
Patient stated "I should have never come up here because y'all don't me do what I want".

## 2017-06-23 NOTE — ED Notes (Signed)
Bed: LA45 Expected date:  Expected time:  Means of arrival:  Comments: EMS detox,

## 2017-06-23 NOTE — ED Notes (Signed)
Pt screaming at Nurse that he wants to leave. Pt disturbing other patients and causing them to worry. Pt verbalized to nurse that "This is a crock of shit, just let me fucking leave" RN told pt he is not safe to leave due to his inebriated state.

## 2017-06-23 NOTE — ED Provider Notes (Addendum)
[facility] DEPT Provider Note   CSN: [id number] Arrival date & time: 06/23/17  1736     History   Chief Complaint No chief complaint on file.   HPI Andrew [name] is a 61 y.o. male. Chief complaint is intoxication.  HPI 61 year old male. Drinks daily. I ask him why he is here he states that he is an alcoholic. When asked specifically. He states "I'm having trouble figuring out life". He has no physical complaints. He is not having pain. He is not sicker injured. He is not suicidal. States she was intoxicated at home alone" wasn't sure what to do".  Past Medical History:  Diagnosis Date  . Anxiety   . Back pain     The patient has chronic lumbar/thoracic back pain, he has kyphosis, mild facet hypertrophy at L1-L2, circumferential annular bulging and mild vertebral body osteophytic formation at L2-L3, mild to moderate stenosis of the medial aspect of the neural foramen at L4-L5, and mild annular bulging at L5-S1.  . Back pain   . Dental caries   . Diabetes mellitus    borderline yrs ago  . Dyslipidemia   . GERD (gastroesophageal reflux disease)   . Glucose intolerance (impaired glucose tolerance)    Max random CBG 130 (08/29/07) typically about 100. A1C in 11/08 was 5.4.  . History of alcohol abuse   . Hyperlipidemia   . Hypertension, essential   . Leg pain   . Peripheral arterial disease ([place])   . Peripheral vascular disease ([place])   . 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 Owens Shark).  Pt will likely require a wide resection and  has been refered to ENT (2/12)  . Thoracic kyphosis   . Tobacco abuse   . Ulcer     Patient Active Problem List   Diagnosis Date Noted  . Alcoholism ([place]) 06/16/2016  . Alcohol withdrawal ([place]) 06/16/2016  . Kidney disease 06/16/2016  . Leukocytosis 06/16/2016  . Alcoholic ketoacidosis 63/14/9702  . S/P BKA (below knee amputation)  ([place]) 12/14/2012  . Abnormality of gait 11/10/2012  . Preventative health care 09/15/2010  . [facility] COMPLICATION DUE CORONARY BYPASS GRAFT 06/10/2010  . CHEST PAIN 03/04/2010  . 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 OTHER DENTAL CARIES 03/14/2008  . DYSLIPIDEMIA 05/10/2006  . Depression with anxiety 05/10/2006  . TOBACCO ABUSE 05/10/2006  . Hypertension 05/10/2006  . PERIPHERAL VASCULAR DISEASE 05/10/2006  . BACK PAIN 05/10/2006  . 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 GLUCOSE INTOLERANCE, HX OF 05/10/2006    Past Surgical History:  Procedure Laterality Date  . ABDOMINAL AORTAGRAM N/A 06/11/2012   Performed by Angelia Mould, MD at [facility] CATH LAB  . AMPUTATION BELOW KNEE Left 12/10/2012   Performed by Rosetta Posner, MD at [place]  . AMPUTATION DIGIT Left 12/06/2012   Performed by Angelia Mould, MD at [place] Bilateral 06/11/2012   Performed by Angelia Mould, MD at [facility] CATH LAB  . BYPASS GRAFT FEMORAL-PERONEAL Left 06/12/2012   Performed by Angelia Mould, MD at [place]  . CAROTID ENDARTERECTOMY    . [place]   secondary to a forklift acciednt and crushed pelvis  . PR VEIN BYPASS GRAFT,AORTO-FEM-POP  2003   Dr. Amedeo Plenty  . PR VEIN BYPASS GRAFT,AORTO-FEM-POP  05/12/11   Left fem-peroneal BPG   .  Thrombectomy and Revision Left FEMORAL-PERONEAL Bypass Graft Left 11/10/2012   Performed by Rosetta Posner, MD at [place] Medications    Prior to Admission medications   Medication Sig Start Date End Date Taking? Authorizing Provider  aspirin EC 81 MG tablet Take 1 tablet (81 mg total) by mouth daily. 12/21/12  Yes Angiulli, Lavon Paganini, PA-C  Vitamins-Lipotropics (B COMPLEX FORMULA 1 PO) Take 1 tablet by mouth daily.     Yes [provider]  azithromycin (ZITHROMAX) 250 MG tablet Take 1 tab daily Patient not taking: Reported on 06/23/2017 06/22/17   Isla Pence, MD  chlordiazePOXIDE  (LIBRIUM) 25 MG capsule 50mg  PO TID x 1D, then 25-50mg  PO BID X 1D, then 25-50mg  PO QD X 1D Patient not taking: Reported on 06/23/2017 06/21/17   Isla Pence, MD  clonazePAM (KLONOPIN) 1 MG tablet TAKE 0.5 TABLET BY MOUTH 3 TIMES A DAY AS NEEDED FOR ANXIETY Patient not taking: Reported on 06/21/2017 06/18/16   Reyne Dumas, MD  feeding supplement, ENSURE ENLIVE, (ENSURE ENLIVE) LIQD Take 237 mLs by mouth 2 (two) times daily between meals. Patient not taking: Reported on 06/21/2017 06/18/16   Reyne Dumas, MD  folic acid (FOLVITE) 1 MG tablet Take 1 tablet (1 mg total) daily by mouth. Patient not taking: Reported on 06/23/2017 06/21/17   Isla Pence, MD  gabapentin (NEURONTIN) 300 MG capsule TAKE ONE CAPSULE 3 TIMES A DAY Patient not taking: Reported on 06/21/2017 07/09/14   Kelby Aline, MD  HYDROcodone-acetaminophen [facility]) 10-325 MG per tablet Take 1 tablet by mouth every 4 (four) hours as needed. Fill 30 days after previous script Patient not taking: Reported on 06/21/2017 10/14/14   Bartholomew Crews, MD  lisinopril-hydrochlorothiazide (PRINZIDE,ZESTORETIC) 20-25 MG tablet Take 2 tablets by mouth daily. Patient not taking: Reported on 06/21/2017 06/18/16   Reyne Dumas, MD  magnesium oxide (MAG-OX) 400 (241.3 Mg) MG tablet Take 1 tablet (400 mg total) 2 (two) times daily by mouth. Patient not taking: Reported on 06/23/2017 06/21/17   Isla Pence, MD  nicotine (NICODERM CQ - DOSED IN MG/24 HOURS) 21 mg/24hr patch Place 1 patch (21 mg total) onto the skin daily. Patient not taking: Reported on 06/21/2017 06/26/13   Hester Mates, MD  pantoprazole (PROTONIX) 40 MG tablet Take 1 tablet (40 mg total) daily by mouth. Patient not taking: Reported on 06/23/2017 06/21/17   Isla Pence, MD  potassium chloride 20 MEQ TBCR Take 20 mEq daily by mouth. Patient not taking: Reported on 06/23/2017 06/21/17   Isla Pence, MD  sertraline (ZOLOFT) 100 MG tablet Take 1 tablet (100 mg  total) daily by mouth. Patient not taking: Reported on 06/23/2017 06/21/17   Isla Pence, MD  simvastatin (ZOCOR) 40 MG tablet Take 1 tablet (40 mg total) at bedtime by mouth. Patient not taking: Reported on 06/23/2017 06/21/17   Isla Pence, MD  thiamine 100 MG tablet Take 1 tablet (100 mg total) daily by mouth. Patient not taking: Reported on 06/23/2017 06/21/17   Isla Pence, MD  traMADol (ULTRAM) 50 MG tablet Take 1 tablet (50 mg total) by mouth every 6 (six) hours as needed. Patient not taking: Reported on 06/21/2017 07/22/14   Sid Falcon, MD    Family History Family History  Problem Relation Age of Onset  . Heart disease Mother   . Cancer Mother   . Alcohol abuse Father   . Heart disease Father   . Stroke Father  Social History Social History   Tobacco Use  . Smoking status: Current Every Day Smoker    Packs/day: 0.50    Years: 40.00    Pack years: 20.00    Types: Cigarettes  . Smokeless tobacco: Never Used  . Tobacco comment: trying to quit / 1PPD  Substance Use Topics  . Alcohol use: Yes    Comment: 1/2 quart daily   . Drug use: No     Allergies   Patient has no known allergies.   Review of Systems Review of Systems  Constitutional: Negative for appetite change, chills, diaphoresis, fatigue and fever.  HENT: Negative for mouth sores, sore throat and trouble swallowing.   Eyes: Negative for visual disturbance.  Respiratory: Negative for cough, chest tightness, shortness of breath and wheezing.   Cardiovascular: Negative for chest pain.  Gastrointestinal: Negative for abdominal distention, abdominal pain, diarrhea, nausea and vomiting.  Endocrine: Negative for polydipsia, polyphagia and polyuria.  Genitourinary: Negative for dysuria, frequency and hematuria.  Musculoskeletal: Negative for gait problem.  Skin: Negative for color change, pallor and rash.  Neurological: Negative for dizziness, syncope, light-headedness and headaches.    Hematological: Does not bruise/bleed easily.  Psychiatric/Behavioral: Negative for behavioral problems and confusion.     Physical Exam Updated Vital Signs BP 113/69 (BP Location: Left Arm)   Pulse 96   Temp 97.8 F (36.6 C)   Resp 16   Ht 6' (1.829 m)   Wt 68 kg (150 lb)   SpO2 96%   BMI 20.34 kg/m   Physical Exam  Constitutional: He is oriented to person, place, and time. He appears well-developed and well-nourished. No distress.  HENT:  Head: Normocephalic.  Eyes: Conjunctivae are normal. Pupils are equal, round, and reactive to light. No scleral icterus.  Neck: Normal range of motion. Neck supple. No thyromegaly present.  Cardiovascular: Normal rate and regular rhythm. Exam reveals no gallop and no friction rub.  No murmur heard. Pulmonary/Chest: Effort normal and breath sounds normal. No respiratory distress. He has no wheezes. He has no rales.  Abdominal: Soft. Bowel sounds are normal. He exhibits no distension. There is no tenderness. There is no rebound.  Musculoskeletal: Normal range of motion.  Neurological: He is alert and oriented to person, place, and time.  Smells of alcohol. Slurs his words.  Skin: Skin is warm and dry. No rash noted.  Psychiatric: He has a normal mood and affect. His behavior is normal.     ED Treatments / Results  Labs (all labs ordered are listed, but only abnormal results are displayed) Labs Reviewed  CBC WITH DIFFERENTIAL/PLATELET - Abnormal; Notable for the following components:      Result Value   RBC 4.10 (*)    Hemoglobin 12.5 (*)    HCT 37.4 (*)    RDW 16.4 (*)    All other components within normal limits  COMPREHENSIVE METABOLIC PANEL - Abnormal; Notable for the following components:   Potassium 3.1 (*)    Glucose, Bld 101 (*)    Calcium 8.8 (*)    Total Protein 6.3 (*)    AST 49 (*)    All other components within normal limits    EKG  EKG Interpretation None       Radiology No results  found.  Procedures Procedures (including critical care time)  Medications Ordered in ED Medications - No data to display   Initial Impression / Assessment and Plan / ED Course  I have reviewed the triage vital signs and  the nursing notes.  Pertinent labs & imaging results that were available during my care of the patient were reviewed by me and considered in my medical decision making (see chart for details).    Stable exam. Reassuring labs. Observed for several hours. He is stable on his feet. Asking for discharge. He is appropriate for discharge. States he will call for a ride.  Final Clinical Impressions(s) / ED Diagnoses   Final diagnoses:  Alcoholic intoxication without complication [facility])    ED Discharge Orders    None       Tanna Furry, MD 06/23/17 2144    Tanna Furry, MD 06/23/17 2144

## 2017-07-17 ENCOUNTER — Encounter (HOSPITAL_COMMUNITY): Payer: Self-pay | Admitting: Emergency Medicine

## 2017-07-17 ENCOUNTER — Inpatient Hospital Stay (HOSPITAL_COMMUNITY)
Admission: EM | Admit: 2017-07-17 | Discharge: 2017-08-10 | DRG: 896 | Disposition: A | Discharge status: Hospice | Payer: Medicare Other | Attending: Family Medicine | Admitting: Family Medicine

## 2017-07-17 ENCOUNTER — Emergency Department (HOSPITAL_COMMUNITY): Payer: Medicare Other

## 2017-07-17 ENCOUNTER — Inpatient Hospital Stay (HOSPITAL_COMMUNITY): Payer: Medicare Other

## 2017-07-17 ENCOUNTER — Other Ambulatory Visit: Payer: Self-pay

## 2017-07-17 DIAGNOSIS — R509 Fever, unspecified: Secondary | ICD-10-CM

## 2017-07-17 DIAGNOSIS — Z01818 Encounter for other preprocedural examination: Secondary | ICD-10-CM

## 2017-07-17 DIAGNOSIS — Z66 Do not resuscitate: Secondary | ICD-10-CM | POA: Diagnosis not present

## 2017-07-17 DIAGNOSIS — E872 Acidosis: Secondary | ICD-10-CM | POA: Diagnosis not present

## 2017-07-17 DIAGNOSIS — Z452 Encounter for adjustment and management of vascular access device: Secondary | ICD-10-CM

## 2017-07-17 DIAGNOSIS — R6521 Severe sepsis with septic shock: Secondary | ICD-10-CM | POA: Diagnosis not present

## 2017-07-17 DIAGNOSIS — E870 Hyperosmolality and hypernatremia: Secondary | ICD-10-CM | POA: Diagnosis not present

## 2017-07-17 DIAGNOSIS — R1310 Dysphagia, unspecified: Secondary | ICD-10-CM | POA: Diagnosis not present

## 2017-07-17 DIAGNOSIS — I169 Hypertensive crisis, unspecified: Secondary | ICD-10-CM | POA: Diagnosis present

## 2017-07-17 DIAGNOSIS — Z4659 Encounter for fitting and adjustment of other gastrointestinal appliance and device: Secondary | ICD-10-CM

## 2017-07-17 DIAGNOSIS — R0600 Dyspnea, unspecified: Secondary | ICD-10-CM | POA: Diagnosis not present

## 2017-07-17 DIAGNOSIS — Z811 Family history of alcohol abuse and dependence: Secondary | ICD-10-CM

## 2017-07-17 DIAGNOSIS — J189 Pneumonia, unspecified organism: Secondary | ICD-10-CM

## 2017-07-17 DIAGNOSIS — E440 Moderate protein-calorie malnutrition: Secondary | ICD-10-CM | POA: Diagnosis not present

## 2017-07-17 DIAGNOSIS — Z823 Family history of stroke: Secondary | ICD-10-CM

## 2017-07-17 DIAGNOSIS — N19 Unspecified kidney failure: Secondary | ICD-10-CM

## 2017-07-17 DIAGNOSIS — L89310 Pressure ulcer of right buttock, unstageable: Secondary | ICD-10-CM | POA: Diagnosis not present

## 2017-07-17 DIAGNOSIS — D65 Disseminated intravascular coagulation [defibrination syndrome]: Secondary | ICD-10-CM | POA: Diagnosis present

## 2017-07-17 DIAGNOSIS — E1151 Type 2 diabetes mellitus with diabetic peripheral angiopathy without gangrene: Secondary | ICD-10-CM | POA: Diagnosis present

## 2017-07-17 DIAGNOSIS — E876 Hypokalemia: Secondary | ICD-10-CM | POA: Diagnosis not present

## 2017-07-17 DIAGNOSIS — I10 Essential (primary) hypertension: Secondary | ICD-10-CM | POA: Diagnosis present

## 2017-07-17 DIAGNOSIS — Z781 Physical restraint status: Secondary | ICD-10-CM

## 2017-07-17 DIAGNOSIS — K921 Melena: Secondary | ICD-10-CM | POA: Diagnosis not present

## 2017-07-17 DIAGNOSIS — Z89512 Acquired absence of left leg below knee: Secondary | ICD-10-CM

## 2017-07-17 DIAGNOSIS — D6489 Other specified anemias: Secondary | ICD-10-CM | POA: Diagnosis present

## 2017-07-17 DIAGNOSIS — M40204 Unspecified kyphosis, thoracic region: Secondary | ICD-10-CM | POA: Diagnosis present

## 2017-07-17 DIAGNOSIS — G9341 Metabolic encephalopathy: Secondary | ICD-10-CM | POA: Diagnosis present

## 2017-07-17 DIAGNOSIS — R0603 Acute respiratory distress: Secondary | ICD-10-CM

## 2017-07-17 DIAGNOSIS — J9600 Acute respiratory failure, unspecified whether with hypoxia or hypercapnia: Secondary | ICD-10-CM

## 2017-07-17 DIAGNOSIS — N179 Acute kidney failure, unspecified: Secondary | ICD-10-CM | POA: Diagnosis not present

## 2017-07-17 DIAGNOSIS — R609 Edema, unspecified: Secondary | ICD-10-CM

## 2017-07-17 DIAGNOSIS — F10920 Alcohol use, unspecified with intoxication, uncomplicated: Secondary | ICD-10-CM | POA: Diagnosis not present

## 2017-07-17 DIAGNOSIS — Z7982 Long term (current) use of aspirin: Secondary | ICD-10-CM

## 2017-07-17 DIAGNOSIS — F10231 Alcohol dependence with withdrawal delirium: Principal | ICD-10-CM | POA: Diagnosis present

## 2017-07-17 DIAGNOSIS — I429 Cardiomyopathy, unspecified: Secondary | ICD-10-CM | POA: Diagnosis not present

## 2017-07-17 DIAGNOSIS — F419 Anxiety disorder, unspecified: Secondary | ICD-10-CM | POA: Diagnosis present

## 2017-07-17 DIAGNOSIS — Z85828 Personal history of other malignant neoplasm of skin: Secondary | ICD-10-CM

## 2017-07-17 DIAGNOSIS — N5089 Other specified disorders of the male genital organs: Secondary | ICD-10-CM | POA: Diagnosis not present

## 2017-07-17 DIAGNOSIS — R197 Diarrhea, unspecified: Secondary | ICD-10-CM | POA: Diagnosis not present

## 2017-07-17 DIAGNOSIS — J9601 Acute respiratory failure with hypoxia: Secondary | ICD-10-CM | POA: Diagnosis present

## 2017-07-17 DIAGNOSIS — F17210 Nicotine dependence, cigarettes, uncomplicated: Secondary | ICD-10-CM | POA: Diagnosis present

## 2017-07-17 DIAGNOSIS — M48061 Spinal stenosis, lumbar region without neurogenic claudication: Secondary | ICD-10-CM | POA: Diagnosis present

## 2017-07-17 DIAGNOSIS — I5021 Acute systolic (congestive) heart failure: Secondary | ICD-10-CM | POA: Diagnosis present

## 2017-07-17 DIAGNOSIS — I639 Cerebral infarction, unspecified: Secondary | ICD-10-CM | POA: Diagnosis not present

## 2017-07-17 DIAGNOSIS — F17200 Nicotine dependence, unspecified, uncomplicated: Secondary | ICD-10-CM | POA: Diagnosis present

## 2017-07-17 DIAGNOSIS — G9340 Encephalopathy, unspecified: Secondary | ICD-10-CM | POA: Diagnosis not present

## 2017-07-17 DIAGNOSIS — T17890A Other foreign object in other parts of respiratory tract causing asphyxiation, initial encounter: Secondary | ICD-10-CM | POA: Diagnosis not present

## 2017-07-17 DIAGNOSIS — A419 Sepsis, unspecified organism: Secondary | ICD-10-CM | POA: Diagnosis not present

## 2017-07-17 DIAGNOSIS — F10230 Alcohol dependence with withdrawal, uncomplicated: Secondary | ICD-10-CM

## 2017-07-17 DIAGNOSIS — F10930 Alcohol use, unspecified with withdrawal, uncomplicated: Secondary | ICD-10-CM

## 2017-07-17 DIAGNOSIS — J9690 Respiratory failure, unspecified, unspecified whether with hypoxia or hypercapnia: Secondary | ICD-10-CM

## 2017-07-17 DIAGNOSIS — N170 Acute kidney failure with tubular necrosis: Secondary | ICD-10-CM | POA: Diagnosis not present

## 2017-07-17 DIAGNOSIS — Z515 Encounter for palliative care: Secondary | ICD-10-CM | POA: Diagnosis not present

## 2017-07-17 DIAGNOSIS — K219 Gastro-esophageal reflux disease without esophagitis: Secondary | ICD-10-CM | POA: Diagnosis present

## 2017-07-17 DIAGNOSIS — F10239 Alcohol dependence with withdrawal, unspecified: Secondary | ICD-10-CM | POA: Diagnosis present

## 2017-07-17 DIAGNOSIS — E785 Hyperlipidemia, unspecified: Secondary | ICD-10-CM | POA: Diagnosis present

## 2017-07-17 DIAGNOSIS — L8990 Pressure ulcer of unspecified site, unspecified stage: Secondary | ICD-10-CM

## 2017-07-17 DIAGNOSIS — Z818 Family history of other mental and behavioral disorders: Secondary | ICD-10-CM

## 2017-07-17 DIAGNOSIS — J690 Pneumonitis due to inhalation of food and vomit: Secondary | ICD-10-CM | POA: Diagnosis present

## 2017-07-17 DIAGNOSIS — Z7189 Other specified counseling: Secondary | ICD-10-CM | POA: Diagnosis not present

## 2017-07-17 DIAGNOSIS — E878 Other disorders of electrolyte and fluid balance, not elsewhere classified: Secondary | ICD-10-CM | POA: Diagnosis present

## 2017-07-17 DIAGNOSIS — F10939 Alcohol use, unspecified with withdrawal, unspecified: Secondary | ICD-10-CM | POA: Diagnosis present

## 2017-07-17 DIAGNOSIS — Z9289 Personal history of other medical treatment: Secondary | ICD-10-CM

## 2017-07-17 DIAGNOSIS — F1020 Alcohol dependence, uncomplicated: Secondary | ICD-10-CM | POA: Diagnosis present

## 2017-07-17 DIAGNOSIS — L89153 Pressure ulcer of sacral region, stage 3: Secondary | ICD-10-CM | POA: Diagnosis not present

## 2017-07-17 DIAGNOSIS — Z89519 Acquired absence of unspecified leg below knee: Secondary | ICD-10-CM

## 2017-07-17 DIAGNOSIS — Z6820 Body mass index (BMI) 20.0-20.9, adult: Secondary | ICD-10-CM

## 2017-07-17 DIAGNOSIS — Z85819 Personal history of malignant neoplasm of unspecified site of lip, oral cavity, and pharynx: Secondary | ICD-10-CM

## 2017-07-17 DIAGNOSIS — Z8249 Family history of ischemic heart disease and other diseases of the circulatory system: Secondary | ICD-10-CM

## 2017-07-17 DIAGNOSIS — K567 Ileus, unspecified: Secondary | ICD-10-CM

## 2017-07-17 LAB — CBC WITH DIFFERENTIAL/PLATELET
BASOS ABS: 0.1 10*3/uL (ref 0.0–0.1)
Basophils Relative: 1 %
Eosinophils Absolute: 0.1 10*3/uL (ref 0.0–0.7)
Eosinophils Relative: 1 %
HEMATOCRIT: 40.9 % (ref 39.0–52.0)
HEMOGLOBIN: 13.5 g/dL (ref 13.0–17.0)
LYMPHS PCT: 29 %
Lymphs Abs: 2 10*3/uL (ref 0.7–4.0)
MCH: 29.1 pg (ref 26.0–34.0)
MCHC: 33 g/dL (ref 30.0–36.0)
MCV: 88.1 fL (ref 78.0–100.0)
MONO ABS: 0.6 10*3/uL (ref 0.1–1.0)
Monocytes Relative: 8 %
NEUTROS ABS: 4.2 10*3/uL (ref 1.7–7.7)
NEUTROS PCT: 61 %
Platelets: 251 10*3/uL (ref 150–400)
RBC: 4.64 MIL/uL (ref 4.22–5.81)
RDW: 15.9 % — ABNORMAL HIGH (ref 11.5–15.5)
WBC: 6.9 10*3/uL (ref 4.0–10.5)

## 2017-07-17 LAB — GLUCOSE, CAPILLARY
Glucose-Capillary: 149 mg/dL — ABNORMAL HIGH (ref 65–99)
Glucose-Capillary: 149 mg/dL — ABNORMAL HIGH (ref 65–99)
Glucose-Capillary: 98 mg/dL (ref 65–99)

## 2017-07-17 LAB — COMPREHENSIVE METABOLIC PANEL
ALT: 45 U/L (ref 17–63)
ANION GAP: 11 (ref 5–15)
AST: 50 U/L — AB (ref 15–41)
Albumin: 3.3 g/dL — ABNORMAL LOW (ref 3.5–5.0)
Alkaline Phosphatase: 91 U/L (ref 38–126)
BUN: 10 mg/dL (ref 6–20)
CHLORIDE: 107 mmol/L (ref 101–111)
CO2: 27 mmol/L (ref 22–32)
Calcium: 8.2 mg/dL — ABNORMAL LOW (ref 8.9–10.3)
Creatinine, Ser: 0.73 mg/dL (ref 0.61–1.24)
GFR calc Af Amer: >60 mL/min (ref >60)
Glucose, Bld: 103 mg/dL — ABNORMAL HIGH (ref 65–99)
POTASSIUM: 2.8 mmol/L — AB (ref 3.5–5.1)
Sodium: 145 mmol/L (ref 135–145)
Total Bilirubin: 0.5 mg/dL (ref 0.3–1.2)
Total Protein: 6.1 g/dL — ABNORMAL LOW (ref 6.5–8.1)

## 2017-07-17 LAB — URINALYSIS, ROUTINE W REFLEX MICROSCOPIC
BILIRUBIN URINE: NEGATIVE
GLUCOSE, UA: NEGATIVE mg/dL
HGB URINE DIPSTICK: NEGATIVE
Ketones, ur: NEGATIVE mg/dL
Leukocytes, UA: NEGATIVE
Nitrite: NEGATIVE
PROTEIN: NEGATIVE mg/dL
Specific Gravity, Urine: 1.01 (ref 1.005–1.030)
pH: 7 (ref 5.0–8.0)

## 2017-07-17 LAB — BLOOD GAS, ARTERIAL
Acid-Base Excess: 0.8 mmol/L (ref 0.0–2.0)
BICARBONATE: 25.7 mmol/L (ref 20.0–28.0)
Drawn by: 441261
FIO2: 60
O2 SAT: 93.6 %
PEEP: 5 cmH2O
PH ART: 7.38 (ref 7.350–7.450)
Patient temperature: 98.6
RATE: 14 resp/min
VT: 620 mL
pCO2 arterial: 44.4 mmHg (ref 32.0–48.0)
pO2, Arterial: 79.1 mmHg — ABNORMAL LOW (ref 83.0–108.0)

## 2017-07-17 LAB — BASIC METABOLIC PANEL
Anion gap: 12 (ref 5–15)
BUN: 9 mg/dL (ref 6–20)
CHLORIDE: 104 mmol/L (ref 101–111)
CO2: 26 mmol/L (ref 22–32)
CREATININE: 0.89 mg/dL (ref 0.61–1.24)
Calcium: 7.7 mg/dL — ABNORMAL LOW (ref 8.9–10.3)
GFR calc non Af Amer: >60 mL/min (ref >60)
GLUCOSE: 213 mg/dL — AB (ref 65–99)
Potassium: 3 mmol/L — ABNORMAL LOW (ref 3.5–5.1)
Sodium: 142 mmol/L (ref 135–145)

## 2017-07-17 LAB — ETHANOL: ALCOHOL ETHYL (B): 206 mg/dL — AB (ref <10)

## 2017-07-17 LAB — RAPID URINE DRUG SCREEN, HOSP PERFORMED
AMPHETAMINES: NOT DETECTED
BARBITURATES: NOT DETECTED
BENZODIAZEPINES: NOT DETECTED
COCAINE: NOT DETECTED
Opiates: NOT DETECTED
TETRAHYDROCANNABINOL: POSITIVE — AB

## 2017-07-17 LAB — MAGNESIUM
MAGNESIUM: 1.5 mg/dL — AB (ref 1.7–2.4)
Magnesium: 1.3 mg/dL — ABNORMAL LOW (ref 1.7–2.4)
Magnesium: 1.4 mg/dL — ABNORMAL LOW (ref 1.7–2.4)

## 2017-07-17 LAB — PHOSPHORUS
PHOSPHORUS: 4.3 mg/dL (ref 2.5–4.6)
Phosphorus: 3.9 mg/dL (ref 2.5–4.6)

## 2017-07-17 LAB — LACTIC ACID, PLASMA
Lactic Acid, Venous: 2.7 mmol/L (ref 0.5–1.9)
Lactic Acid, Venous: 3.1 mmol/L (ref 0.5–1.9)

## 2017-07-17 LAB — STREP PNEUMONIAE URINARY ANTIGEN: Strep Pneumo Urinary Antigen: NEGATIVE

## 2017-07-17 MED ORDER — POTASSIUM CHLORIDE CRYS ER 20 MEQ PO TBCR
40.0000 meq | EXTENDED_RELEASE_TABLET | Freq: Once | ORAL | Status: AC
  Administered 2017-07-17: 40 meq via ORAL
  Filled 2017-07-17: qty 2

## 2017-07-17 MED ORDER — SODIUM CHLORIDE 0.9 % IV BOLUS (SEPSIS)
1000.0000 mL | Freq: Once | INTRAVENOUS | Status: AC
  Administered 2017-07-17: 1000 mL via INTRAVENOUS

## 2017-07-17 MED ORDER — AMLODIPINE BESYLATE 5 MG PO TABS
5.0000 mg | ORAL_TABLET | Freq: Once | ORAL | Status: AC
  Administered 2017-07-17: 5 mg via ORAL
  Filled 2017-07-17: qty 1

## 2017-07-17 MED ORDER — SIMVASTATIN 40 MG PO TABS: 40.0000 mg | ORAL_TABLET | Freq: Every day | ORAL | 0 refills | Status: DC

## 2017-07-17 MED ORDER — SODIUM CHLORIDE 0.9 % IV SOLN: 250.0000 mL | INTRAVENOUS | Status: DC | PRN

## 2017-07-17 MED ORDER — IPRATROPIUM-ALBUTEROL 0.5-2.5 (3) MG/3ML IN SOLN
RESPIRATORY_TRACT | Status: AC
  Filled 2017-07-17: qty 3

## 2017-07-17 MED ORDER — THIAMINE HCL 100 MG/ML IJ SOLN
100.0000 mg | Freq: Every day | INTRAMUSCULAR | Status: DC
  Administered 2017-07-17 – 2017-07-22 (×5): 100 mg via INTRAVENOUS
  Filled 2017-07-17 (×5): qty 2

## 2017-07-17 MED ORDER — CHLORDIAZEPOXIDE HCL 25 MG PO CAPS
ORAL_CAPSULE | ORAL | 0 refills | Status: DC
Start: 2017-07-17 — End: 2017-07-23

## 2017-07-17 MED ORDER — VITAL AF 1.2 CAL PO LIQD
1000.0000 mL | ORAL | Status: DC
  Administered 2017-07-18: 1000 mL
  Filled 2017-07-17 (×2): qty 1000

## 2017-07-17 MED ORDER — PRO-STAT SUGAR FREE PO LIQD
30.0000 mL | Freq: Two times a day (BID) | ORAL | Status: DC
  Filled 2017-07-17: qty 30

## 2017-07-17 MED ORDER — FENTANYL CITRATE (PF) 100 MCG/2ML IJ SOLN
100.0000 ug | INTRAMUSCULAR | Status: DC | PRN
  Administered 2017-07-17 – 2017-07-26 (×28): 100 ug via INTRAVENOUS
  Filled 2017-07-17 (×6): qty 2

## 2017-07-17 MED ORDER — ORAL CARE MOUTH RINSE
15.0000 mL | Freq: Four times a day (QID) | OROMUCOSAL | Status: DC
  Administered 2017-07-17 – 2017-07-31 (×49): 15 mL via OROMUCOSAL

## 2017-07-17 MED ORDER — FENTANYL 2500MCG IN NS 250ML (10MCG/ML) PREMIX INFUSION
0.0000 ug/h | INTRAVENOUS | Status: DC
  Filled 2017-07-17: qty 250

## 2017-07-17 MED ORDER — LORAZEPAM 2 MG/ML IJ SOLN
2.0000 mg | Freq: Once | INTRAMUSCULAR | Status: AC
  Administered 2017-07-17: 2 mg via INTRAVENOUS
  Filled 2017-07-17: qty 1

## 2017-07-17 MED ORDER — ALBUTEROL SULFATE HFA 108 (90 BASE) MCG/ACT IN AERS
1.0000 | INHALATION_SPRAY | RESPIRATORY_TRACT | Status: DC | PRN
  Administered 2017-07-17: 2 via RESPIRATORY_TRACT
  Filled 2017-07-17: qty 6.7

## 2017-07-17 MED ORDER — ENOXAPARIN SODIUM 40 MG/0.4ML ~~LOC~~ SOLN
40.0000 mg | SUBCUTANEOUS | Status: DC
  Administered 2017-07-17 – 2017-07-18 (×2): 40 mg via SUBCUTANEOUS
  Filled 2017-07-17 (×2): qty 0.4

## 2017-07-17 MED ORDER — CHLORDIAZEPOXIDE HCL 25 MG PO CAPS: ORAL_CAPSULE | ORAL | 0 refills | Status: DC

## 2017-07-17 MED ORDER — LORAZEPAM 1 MG PO TABS
0.0000 mg | ORAL_TABLET | Freq: Four times a day (QID) | ORAL | Status: DC
Start: 2017-07-17 — End: 2017-07-17
  Administered 2017-07-17: 1 mg via ORAL
  Filled 2017-07-17: qty 2

## 2017-07-17 MED ORDER — SODIUM CHLORIDE 0.9 % IV SOLN
0.0000 ug/h | INTRAVENOUS | Status: DC
  Administered 2017-07-17: 50 ug/h via INTRAVENOUS
  Administered 2017-07-19 (×2): 150 ug/h via INTRAVENOUS
  Administered 2017-07-19: 225 ug/h via INTRAVENOUS
  Administered 2017-07-20 – 2017-07-21 (×2): 100 ug/h via INTRAVENOUS
  Administered 2017-07-22: 125 ug/h via INTRAVENOUS
  Administered 2017-07-23: 200 ug/h via INTRAVENOUS
  Administered 2017-07-23: 325 ug/h via INTRAVENOUS
  Administered 2017-07-23: 100 ug/h via INTRAVENOUS
  Administered 2017-07-24: 350 ug/h via INTRAVENOUS
  Administered 2017-07-24: 325 ug/h via INTRAVENOUS
  Administered 2017-07-24: 350 ug/h via INTRAVENOUS
  Administered 2017-07-25: 400 ug/h via INTRAVENOUS
  Filled 2017-07-17 (×14): qty 50

## 2017-07-17 MED ORDER — VITAL HIGH PROTEIN PO LIQD
1000.0000 mL | ORAL | Status: DC
  Filled 2017-07-17: qty 1000

## 2017-07-17 MED ORDER — POTASSIUM CHLORIDE 10 MEQ/100ML IV SOLN
10.0000 meq | INTRAVENOUS | Status: AC
  Administered 2017-07-17 (×3): 10 meq via INTRAVENOUS
  Filled 2017-07-17 (×2): qty 100

## 2017-07-17 MED ORDER — MIDAZOLAM HCL 2 MG/2ML IJ SOLN
INTRAMUSCULAR | Status: AC
  Filled 2017-07-17: qty 2

## 2017-07-17 MED ORDER — CHLORHEXIDINE GLUCONATE 0.12% ORAL RINSE (MEDLINE KIT)
15.0000 mL | Freq: Two times a day (BID) | OROMUCOSAL | Status: DC
  Administered 2017-07-17 – 2017-07-30 (×25): 15 mL via OROMUCOSAL

## 2017-07-17 MED ORDER — FENTANYL CITRATE (PF) 100 MCG/2ML IJ SOLN
100.0000 ug | INTRAMUSCULAR | Status: DC | PRN
  Administered 2017-07-22: 100 ug via INTRAVENOUS
  Filled 2017-07-17: qty 2

## 2017-07-17 MED ORDER — MAGNESIUM OXIDE 400 (241.3 MG) MG PO TABS: 400.0000 mg | ORAL_TABLET | Freq: Two times a day (BID) | ORAL | 0 refills | Status: DC

## 2017-07-17 MED ORDER — VANCOMYCIN HCL IN DEXTROSE 1-5 GM/200ML-% IV SOLN: 1000.0000 mg | Freq: Two times a day (BID) | INTRAVENOUS | Status: DC

## 2017-07-17 MED ORDER — POTASSIUM CHLORIDE ER 20 MEQ PO TBCR: 20.0000 meq | EXTENDED_RELEASE_TABLET | Freq: Two times a day (BID) | ORAL | 0 refills | Status: AC

## 2017-07-17 MED ORDER — VITAMIN B-1 100 MG PO TABS
100.0000 mg | ORAL_TABLET | Freq: Every day | ORAL | Status: DC
  Administered 2017-07-17 – 2017-07-20 (×2): 100 mg via ORAL
  Filled 2017-07-17 (×2): qty 1

## 2017-07-17 MED ORDER — LORAZEPAM 2 MG/ML IJ SOLN: 2.0000 mg | Freq: Once | INTRAMUSCULAR | Status: DC

## 2017-07-17 MED ORDER — FENTANYL CITRATE (PF) 100 MCG/2ML IJ SOLN
INTRAMUSCULAR | Status: AC
  Filled 2017-07-17: qty 2

## 2017-07-17 MED ORDER — FAMOTIDINE IN NACL 20-0.9 MG/50ML-% IV SOLN
20.0000 mg | Freq: Two times a day (BID) | INTRAVENOUS | Status: DC
  Administered 2017-07-17 – 2017-07-18 (×4): 20 mg via INTRAVENOUS
  Filled 2017-07-17 (×4): qty 50

## 2017-07-17 MED ORDER — MAGNESIUM SULFATE 2 GM/50ML IV SOLN
2.0000 g | Freq: Once | INTRAVENOUS | Status: AC
  Administered 2017-07-17: 2 g via INTRAVENOUS
  Filled 2017-07-17: qty 50

## 2017-07-17 MED ORDER — LISINOPRIL 20 MG PO TABS: 20.0000 mg | ORAL_TABLET | Freq: Every day | ORAL | 0 refills | Status: AC

## 2017-07-17 MED ORDER — LORAZEPAM 1 MG PO TABS: 0.0000 mg | ORAL_TABLET | Freq: Two times a day (BID) | ORAL | Status: DC

## 2017-07-17 MED ORDER — POTASSIUM CHLORIDE ER 20 MEQ PO TBCR: 20.0000 meq | EXTENDED_RELEASE_TABLET | Freq: Two times a day (BID) | ORAL | 0 refills | Status: DC

## 2017-07-17 MED ORDER — IPRATROPIUM-ALBUTEROL 0.5-2.5 (3) MG/3ML IN SOLN
3.0000 mL | Freq: Once | RESPIRATORY_TRACT | Status: AC
  Administered 2017-07-17: 3 mL via RESPIRATORY_TRACT

## 2017-07-17 MED ORDER — HYDRALAZINE HCL 20 MG/ML IJ SOLN
10.0000 mg | INTRAMUSCULAR | Status: DC | PRN
  Filled 2017-07-17: qty 1

## 2017-07-17 MED ORDER — SODIUM CHLORIDE 0.9 % IV SOLN
250.0000 mL | INTRAVENOUS | Status: DC | PRN
  Administered 2017-07-18: 250 mL via INTRAVENOUS
  Administered 2017-07-18: 1000 mL via INTRAVENOUS

## 2017-07-17 MED ORDER — ALBUTEROL SULFATE (2.5 MG/3ML) 0.083% IN NEBU
2.5000 mg | INHALATION_SOLUTION | RESPIRATORY_TRACT | Status: DC | PRN
  Administered 2017-07-29: 2.5 mg via RESPIRATORY_TRACT
  Filled 2017-07-17: qty 3

## 2017-07-17 MED ORDER — VANCOMYCIN HCL IN DEXTROSE 1-5 GM/200ML-% IV SOLN
1000.0000 mg | Freq: Once | INTRAVENOUS | Status: DC
Start: 2017-07-17 — End: 2017-07-17

## 2017-07-17 MED ORDER — DEXMEDETOMIDINE HCL IN NACL 400 MCG/100ML IV SOLN
0.0000 ug/kg/h | INTRAVENOUS | Status: DC
  Administered 2017-07-17: 0.7 ug/kg/h via INTRAVENOUS
  Administered 2017-07-17 (×2): 1 ug/kg/h via INTRAVENOUS
  Administered 2017-07-17: 0.5 ug/kg/h via INTRAVENOUS
  Administered 2017-07-18: 1 ug/kg/h via INTRAVENOUS
  Administered 2017-07-18 (×2): 1.2 ug/kg/h via INTRAVENOUS
  Administered 2017-07-18 (×2): 1.1 ug/kg/h via INTRAVENOUS
  Administered 2017-07-18 (×4): 1 ug/kg/h via INTRAVENOUS
  Administered 2017-07-18 – 2017-07-19 (×8): 1.2 ug/kg/h via INTRAVENOUS
  Administered 2017-07-20: 1 ug/kg/h via INTRAVENOUS
  Administered 2017-07-20: 0.9 ug/kg/h via INTRAVENOUS
  Administered 2017-07-20: 1.2 ug/kg/h via INTRAVENOUS
  Administered 2017-07-20 (×3): 1 ug/kg/h via INTRAVENOUS
  Administered 2017-07-20: 1.2 ug/kg/h via INTRAVENOUS
  Administered 2017-07-21 – 2017-07-22 (×8): 1 ug/kg/h via INTRAVENOUS
  Administered 2017-07-22: 0.7 ug/kg/h via INTRAVENOUS
  Administered 2017-07-22: 0.4 ug/kg/h via INTRAVENOUS
  Administered 2017-07-23 (×7): 1.2 ug/kg/h via INTRAVENOUS
  Administered 2017-07-23: 1 ug/kg/h via INTRAVENOUS
  Administered 2017-07-24 – 2017-07-25 (×11): 1.2 ug/kg/h via INTRAVENOUS
  Filled 2017-07-17 (×6): qty 50
  Filled 2017-07-17: qty 150
  Filled 2017-07-17 (×3): qty 50
  Filled 2017-07-17: qty 100
  Filled 2017-07-17 (×19): qty 50
  Filled 2017-07-17: qty 100
  Filled 2017-07-17 (×6): qty 50
  Filled 2017-07-17: qty 100
  Filled 2017-07-17 (×11): qty 50
  Filled 2017-07-17: qty 100
  Filled 2017-07-17 (×3): qty 50
  Filled 2017-07-17: qty 100
  Filled 2017-07-17 (×2): qty 50
  Filled 2017-07-17: qty 150
  Filled 2017-07-17 (×2): qty 50

## 2017-07-17 MED ORDER — INSULIN ASPART 100 UNIT/ML ~~LOC~~ SOLN
0.0000 [IU] | SUBCUTANEOUS | Status: DC
  Administered 2017-07-17 – 2017-07-23 (×23): 2 [IU] via SUBCUTANEOUS
  Administered 2017-07-24: 5 [IU] via SUBCUTANEOUS
  Administered 2017-07-24: 3 [IU] via SUBCUTANEOUS
  Administered 2017-07-24: 2 [IU] via SUBCUTANEOUS
  Administered 2017-07-24: 3 [IU] via SUBCUTANEOUS
  Administered 2017-07-24: 5 [IU] via SUBCUTANEOUS
  Administered 2017-07-24 – 2017-07-25 (×2): 3 [IU] via SUBCUTANEOUS
  Administered 2017-07-25: 5 [IU] via SUBCUTANEOUS
  Administered 2017-07-25 (×3): 3 [IU] via SUBCUTANEOUS
  Administered 2017-07-26: 2 [IU] via SUBCUTANEOUS
  Administered 2017-07-26: 3 [IU] via SUBCUTANEOUS
  Administered 2017-07-26: 2 [IU] via SUBCUTANEOUS

## 2017-07-17 MED ORDER — LORAZEPAM 2 MG/ML IJ SOLN
0.0000 mg | Freq: Two times a day (BID) | INTRAMUSCULAR | Status: DC
Start: 2017-07-19 — End: 2017-07-17

## 2017-07-17 MED ORDER — SODIUM CHLORIDE 0.9 % IV SOLN
3.0000 g | Freq: Four times a day (QID) | INTRAVENOUS | Status: DC
  Administered 2017-07-17 – 2017-07-19 (×8): 3 g via INTRAVENOUS
  Filled 2017-07-17 (×9): qty 3

## 2017-07-17 MED ORDER — SODIUM CHLORIDE 0.9 % IV SOLN
3.0000 g | Freq: Four times a day (QID) | INTRAVENOUS | Status: DC
  Filled 2017-07-17: qty 3

## 2017-07-17 MED ORDER — SIMVASTATIN 40 MG PO TABS: 40.0000 mg | ORAL_TABLET | Freq: Every day | ORAL | 0 refills | Status: AC

## 2017-07-17 MED ORDER — LORAZEPAM 2 MG/ML IJ SOLN: 0.0000 mg | Freq: Four times a day (QID) | INTRAMUSCULAR | Status: DC

## 2017-07-17 MED ORDER — SODIUM CHLORIDE 0.9 % IV SOLN
0.0000 ug/kg/h | INTRAVENOUS | Status: DC
  Filled 2017-07-17: qty 2

## 2017-07-17 MED ORDER — MIDAZOLAM HCL 2 MG/2ML IJ SOLN
1.0000 mg | INTRAMUSCULAR | Status: DC | PRN
  Administered 2017-07-17: 2 mg via INTRAVENOUS
  Administered 2017-07-18 (×2): 1 mg via INTRAVENOUS
  Administered 2017-07-18 – 2017-07-24 (×30): 2 mg via INTRAVENOUS
  Filled 2017-07-17 (×39): qty 2

## 2017-07-17 MED ORDER — LISINOPRIL 20 MG PO TABS: 20.0000 mg | ORAL_TABLET | Freq: Every day | ORAL | 0 refills | Status: DC

## 2017-07-17 MED ORDER — SODIUM CHLORIDE 0.9 % IV SOLN
0.0000 ug/h | INTRAVENOUS | Status: DC
  Filled 2017-07-17: qty 50

## 2017-07-17 MED ORDER — MAGNESIUM OXIDE 400 (241.3 MG) MG PO TABS: 400.0000 mg | ORAL_TABLET | Freq: Two times a day (BID) | ORAL | 0 refills | Status: AC

## 2017-07-17 MED ORDER — PHENYLEPHRINE HCL-NACL 10-0.9 MG/250ML-% IV SOLN
0.0000 ug/min | INTRAVENOUS | Status: DC
  Administered 2017-07-17: 20 ug/min via INTRAVENOUS
  Administered 2017-07-18: 21 ug/min via INTRAVENOUS
  Administered 2017-07-18: 30 ug/min via INTRAVENOUS
  Administered 2017-07-18: 16 ug/min via INTRAVENOUS
  Administered 2017-07-18: 25 ug/min via INTRAVENOUS
  Administered 2017-07-19: 10 ug/min via INTRAVENOUS
  Administered 2017-07-19: 14 ug/min via INTRAVENOUS
  Administered 2017-07-20 (×2): 40 ug/min via INTRAVENOUS
  Administered 2017-07-20: 30 ug/min via INTRAVENOUS
  Administered 2017-07-20: 40 ug/min via INTRAVENOUS
  Administered 2017-07-20: 30 ug/min via INTRAVENOUS
  Administered 2017-07-21: 55 ug/min via INTRAVENOUS
  Administered 2017-07-21: 40 ug/min via INTRAVENOUS
  Administered 2017-07-21: 50 ug/min via INTRAVENOUS
  Administered 2017-07-21: 55 ug/min via INTRAVENOUS
  Administered 2017-07-22: 30 ug/min via INTRAVENOUS
  Administered 2017-07-22: 35 ug/min via INTRAVENOUS
  Filled 2017-07-17 (×19): qty 250

## 2017-07-17 MED ORDER — SODIUM CHLORIDE 0.9 % IV BOLUS (SEPSIS)
1000.0000 mL | Freq: Once | INTRAVENOUS | Status: AC
Start: 2017-07-17 — End: 2017-07-17
  Administered 2017-07-17: 1000 mL via INTRAVENOUS

## 2017-07-17 MED ORDER — CEFEPIME HCL 1 G IJ SOLR
1.0000 g | Freq: Once | INTRAMUSCULAR | Status: DC
  Filled 2017-07-17: qty 1

## 2017-07-17 NOTE — Procedures (Signed)
Intubation Procedure Note MONTERIUS ROLF [id number] 26-Apr-1956  Procedure: Intubation Indications: Airway protection and maintenance  Procedure Details Consent: Unable to obtain consent because of altered level of consciousness. Time Out: Verified patient identification, verified procedure, site/side was marked, verified correct patient position, special equipment/implants available, medications/allergies/relevent history reviewed, required imaging and test results available.  Performed  Drugs Versed 35m IV, Fentanyl 540m IV, Etomidate 2067mV, Rocuronium 30m75m DL x 1 with MAC 3 blade Grade 3 view> could see epiglottis without difficulty, but could not visualize the arytenoids very well  tube passed through cords under direct visualization Placement confirmed with bilateral breath sounds, positive EtCO2 change and smoke in tube   Evaluation Hemodynamic Status: BP stable throughout; O2 sats: stable throughout Patient's Current Condition: stable Complications: No apparent complications Patient did tolerate procedure well. Chest X-ray ordered to verify placement.  CXR: pending.   DougSimonne Maffucci10/2018

## 2017-07-17 NOTE — Progress Notes (Signed)
MD paged in regards to critical lactic acid of 2.2, and hypotension. Hypotension bnelieved to be sedation related. Precedex turned off and will continue to monitor.

## 2017-07-17 NOTE — Progress Notes (Signed)
Pharmacy Antibiotic Note  Andrew [name] is a 61 y.o. male admitted on 07/17/2017 with pneumonia.  Pharmacy has been consulted for ampicillin/sulbactam dosing. Patient presents with alcohol withdrawal and concerns for aspiration pneumonia. Ordered vancomycin and cefepime x 1 in ED  Plan:  unasyn 3gm IV q6h  Do not anticipate need for renal adjustment, pharmacy to sign-off  Height: 6' (182.9 cm) Weight: 150 lb (68 kg) IBW/kg (Calculated) : 77.6  Temp (24hrs), Avg:98.2 F (36.8 C), Min:98.2 F (36.8 C), Max:98.2 F (36.8 C)  Recent Labs  Lab 07/17/17 0420  WBC 6.9  CREATININE 0.73    Estimated Creatinine Clearance: 95.6 mL/min (by C-G formula based on SCr of 0.73 mg/dL).    No Known Allergies  Antimicrobials this admission: 12/10 unasyn >>  Dose adjustments this admission:  Microbiology results: 12/10 BCx: 12/10 Strep Ag: 12/10 legionella Ag:   Thank you for allowing pharmacy to be a part of this patient's care.  Doreene Eland, PharmD, BCPS.   Pager: [phone] 07/17/2017 12:35 PM

## 2017-07-17 NOTE — ED Provider Notes (Addendum)
Clinical Course as of Jul 17 1022  Mon Jul 17, 2017  0923 Plan was for dc after pt received his medications.  Pt's heart rate is 130s on the monitor.  Will order an ECG, more fluids.  He remains hypertensive.  Will likely require admission for complicated alcohol withdrawal.  [JK]    Clinical Course User Index [JK] Dorie Rank, MD    EKG Interpretation  Date/Time:  Monday July 17 2017 10:13:29 EST Ventricular Rate:  130 PR Interval:    QRS Duration: 94 QT Interval:  335 QTC Calculation: 493 R Axis:   66 Text Interpretation:  Sinus tachycardia Left ventricular hypertrophy Anterior Q waves, possibly due to LVH Nonspecific T abnormalities, inferior leads Baseline wander in lead(s) V1 Since last tracing rate faster Confirmed by Dorie Rank [phone]) on 07/17/2017 10:18:29 AM       .Critical Care Performed by: Dorie Rank, MD Authorized by: Dorie Rank, MD   Critical care provider statement:    Critical care time (minutes):  45   Critical care was time spent personally by me on the following activities:  Discussions with consultants, evaluation of patient's response to treatment, examination of patient, ordering and performing treatments and interventions, ordering and review of laboratory studies, ordering and review of radiographic studies, pulse oximetry, re-evaluation of patient's condition, obtaining history from patient or surrogate and review of old charts     Dorie Rank, MD 07/17/17 1024  Pt appears worse, now tachypnic, more hypertensive and dyspneic.  Will start him on bipap.  CXR was ordered and is pending.  I had ordered more ativan earlier for worsening DTs at 0924.  This was not given by nursing based on his CIWA score..  I asked nursing to please give him this medication.  Plan on admission to ICU      Dorie Rank, MD 07/17/17 1109   D/w Dr [place].  Will admit to the ICU.   Will continue with ativan.  If not improving, start precedex.     Dorie Rank,  MD 07/17/17 1138   CXR suggests pna.  Will start abx to cover healthcare associated pna.  Additional ativan given.   Dorie Rank, MD 07/17/17 1155

## 2017-07-17 NOTE — Discharge Instructions (Signed)
Return if you are having any problems.  You have know high blood pressure and high cholesterol - you need to take the medication to control those conditions. Your potassium and magnesium were very low today - you need to take the supplements to keep those levels in a safe range.

## 2017-07-17 NOTE — H&P (Signed)
PULMONARY / CRITICAL CARE MEDICINE   Name: Andrew [name] MRN: [id number] DOB: 1955/09/13    ADMISSION DATE:  07/17/2017 CONSULTATION DATE: July 17, 2017  REFERRING MD: Dr. Tomi Bamberger  CHIEF COMPLAINT: Shortness of breath  HISTORY OF PRESENT ILLNESS:   This is a 61 year old male with a past medical history significant for alcohol abuse who presented to the [facility] long emergency department last night in the setting of alcohol withdrawal.  He stated that he drank a significant amount of vodka per day and his last drink of alcohol was at approximately 11 PM on July 16, 2017, and he started feeling signs and symptoms of withdrawal and by the early morning of July 17, 2017.  He came to the [facility] long emergency department for further care.  There he received some IV Ativan and monitoring but his blood pressure, heart rate, and respiratory status worsened.  The patient was started on BiPAP.  He was given more Ativan.  Pulmonary and critical care medicine was consulted for admission to the intensive care unit.  PAST MEDICAL HISTORY :  He  has a past medical history of Anxiety, Back pain, Back pain, Dental caries, Diabetes mellitus, Dyslipidemia, GERD (gastroesophageal reflux disease), Glucose intolerance (impaired glucose tolerance), History of alcohol abuse, Hyperlipidemia, Hypertension, essential, Leg pain, Peripheral arterial disease ([place]), Peripheral vascular disease ([place]), Squamous cell cancer of lip (3/10), Thoracic kyphosis, Tobacco abuse, and Ulcer.  PAST SURGICAL HISTORY: He  has a past surgical history that includes Carotid endarterectomy; Penile prosthesis implant (1985); pr vein bypass graft,aorto-fem-pop (2003); pr vein bypass graft,aorto-fem-pop (05/12/11); Bypass graft femoral-peroneal (06/12/2012); Bypass graft femoral-peroneal (Left, 11/10/2012); Amputation (Left, 12/06/2012); Amputation (Left, 12/10/2012); and abdominal aortagram (N/A, 06/11/2012).  No Known Allergies  No current  facility-administered medications on file prior to encounter.    Current Outpatient Medications on File Prior to Encounter  Medication Sig  . aspirin EC 81 MG tablet Take 1 tablet (81 mg total) by mouth daily.  . Vitamins-Lipotropics (B COMPLEX FORMULA 1 PO) Take 1 tablet by mouth daily.      FAMILY HISTORY:  His indicated that his mother is alive. He indicated that his father is deceased.   SOCIAL HISTORY: He  reports that he has been smoking cigarettes.  He has a 20.00 pack-year smoking history. he has never used smokeless tobacco. He reports that he drinks alcohol. He reports that he does not use drugs.  REVIEW OF SYSTEMS:   Cannot obtain due to confusion  SUBJECTIVE:  As above  VITAL SIGNS: BP (!) 198/127   Pulse (!) 147   Temp 98.2 F (36.8 C) (Oral)   Resp (!) 45   Ht 6' (1.829 m)   Wt 150 lb (68 kg)   SpO2 95%   BMI 20.34 kg/m   HEMODYNAMICS:    VENTILATOR SETTINGS:    INTAKE / OUTPUT: No intake/output data recorded.  PHYSICAL EXAMINATION: General: Sitting up in bed, on BiPAP, tachypneic Neuro: Drowsy, arousable to voice, speech incoherent, follows some commands HEENT: Normocephalic atraumatic, BiPAP mask in place Cardiovascular: Tachycardic, regular rhythm Lungs: Rhonchi wheezing bilaterally Abdomen: Bowel sounds positive nontender nondistended Musculoskeletal: Status post left BKA Skin: No obvious rash or skin breakdown  LABS:  BMET Recent Labs  Lab 07/17/17 0420  NA 145  K 2.8*  CL 107  CO2 27  BUN 10  CREATININE 0.73  GLUCOSE 103*    Electrolytes Recent Labs  Lab 07/17/17 0420  CALCIUM 8.2*  MG 1.3*    CBC Recent Labs  Lab 07/17/17 0420  WBC 6.9  HGB 13.5  HCT 40.9  PLT 251    Coag's No results for input(s): APTT, INR in the last 168 hours.  Sepsis Markers No results for input(s): LATICACIDVEN, PROCALCITON, O2SATVEN in the last 168 hours.  ABG No results for input(s): PHART, PCO2ART, PO2ART in the last 168  hours.  Liver Enzymes Recent Labs  Lab 07/17/17 0420  AST 50*  ALT 45  ALKPHOS 91  BILITOT 0.5  ALBUMIN 3.3*    Cardiac Enzymes No results for input(s): TROPONINI, PROBNP in the last 168 hours.  Glucose No results for input(s): GLUCAP in the last 168 hours.  Imaging Dg Chest Portable 1 View  Result Date: 07/17/2017 CLINICAL DATA:  Delirium tram ends, history of diabetes, hypertension, current smoker. EXAM: PORTABLE CHEST 1 VIEW COMPARISON:  Chest x-ray of June 21, 2017 FINDINGS: The lungs are adequately inflated. There is airspace opacity at the right lung base. There is no large pleural effusion. The heart is normal in size. The pulmonary vascularity is not clearly engorged. The bony thorax exhibits no acute abnormality. IMPRESSION: Right basilar pneumonia which appears new. Mild chronic bronchitic changes, stable. Electronically Signed   By: David  Martinique M.D.   On: 07/17/2017 11:23     STUDIES:  July 17, 2017 chest x-ray images independently reviewed showing a right lower lobe consolidation  CULTURES: December 10 blood cultures December 10 urine strep antigen December 10 urine Legionella antigen  ANTIBIOTICS: December 10 Unasyn  SIGNIFICANT EVENTS:   LINES/TUBES: December 10 endotracheal tube  DISCUSSION: 61 year old male with a past medical history significant for alcohol abuse presented to the [facility] long emergency department with complaints of a feeling that he was going into alcohol withdrawal.  Developed acute respiratory failure with hypoxemia after admission.  This is likely due to a right lower lobe aspiration pneumonia.  ASSESSMENT / PLAN:  PULMONARY A: Acute respiratory failure with hypoxemia Wheezing Aspiration pneumonia P:   Moved to ICU Intubate Mechanical ventilatory support Daily wakeup assessment and spontaneous breathing trial Chest x-ray after intubation and daily Ventilator associated pneumonia prevention  protocol  CARDIOVASCULAR A:  Hypertension due to alcohol withdrawal Tachycardia due to alcohol withdrawal P:  Telemetry monitoring As needed hydralazine Monitor hemodynamics in ICU setting Check lactic acid  RENAL A:   Hypokalemia P:   Monitor BMET and UOP (check BMET now) Replace electrolytes as needed  GASTROINTESTINAL A:   High risk for re-feeding syndrome P:   Start tube feeding pepcid for stress ulcer prophylaxis Monitor K, Mg, Phos closely   HEMATOLOGIC A:   No acute issues P:  Monitor for bleeding Lovenox for DVT prophylaxis  INFECTIOUS A:   Aspiration pneumonia P:   Start unasyn Send blood/resp cultures  ENDOCRINE A:   DM2  P:   SSI q4h  NEUROLOGIC A:   Acute encephalopathy Alcohol withdrawal P:   RASS goal: -2 Precedex Fentanyl prn Thiamine daily   FAMILY  - Updates: none bedside  - Inter-disciplinary family meet or Palliative Care meeting due by:  day 7  My cc time 40 minutes  Roselie Awkward, MD [place] [facility] Pager: [phone] Cell: [phone] After 3pm or if no response, call [phone]    07/17/2017, 12:24 PM

## 2017-07-17 NOTE — Progress Notes (Signed)
eLink Physician-Brief Progress Note Patient Name: Andrew [name] DOB: [birth year] MRN: [id number]   Date of Service  07/17/2017  HPI/Events of Note  Lactic Acid = 2.7 --> 3.1. Hgb = 13.5.  eICU Interventions  Will bolus with 0.9 NaCl 1 liter IV over 1 hour now. Continue to trend lactic acid.      Intervention Category Major Interventions: Acid-Base disturbance - evaluation and management  Issaac Shipper Eugene 07/17/2017, 9:50 PM

## 2017-07-17 NOTE — Progress Notes (Signed)
CRITICAL VALUE ALERT  Critical Value:  Lactic acid 3.1  Date & Time Notied:  07/17/17 1953  Provider Notified: Dr. Emmit Alexanders  Orders Received/Actions taken: 1 liter Normal Saline bolus, continue to trend lactic acid
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
## 2017-07-17 NOTE — ED Notes (Signed)
Bed: MB84 Expected date:  Expected time:  Means of arrival:  Comments: Triage 5

## 2017-07-17 NOTE — Progress Notes (Signed)
A consult was received from an ED physician for vancomycin per pharmacy dosing.  The patient's profile has been reviewed for ht/wt/allergies/indication/available labs.    A one time order has been placed for vancomycin 1gm IV x1.  Further antibiotics/pharmacy consults should be ordered by admitting physician if indicated.                       Thank you, Lynelle Doctor 07/17/2017  12:08 PM

## 2017-07-17 NOTE — Progress Notes (Signed)
Initial Nutrition Assessment  DOCUMENTATION CODES:   Not applicable  INTERVENTION:   Vital AF 1.2 @ 55 ml/hr  Provides: 1584 kcals, 99 grams protein, 1069 ml free water. This meets 105% of calorie needs and 104% of protein needs. Will readjust rate accordingly.   Recommend checking phosphorus level Pt is at high risk for refeeding. Monitor and supplement electrolytes as needed per MD descretion.   NUTRITION DIAGNOSIS:   Inadequate oral intake related to inability to eat as evidenced by NPO status.  GOAL:   Provide needs based on ASPEN/SCCM guidelines  MONITOR:   Weight trends, Labs, Diet advancement, TF tolerance, Vent status, I & O's  REASON FOR ASSESSMENT:   Consult Enteral/tube feeding initiation and management  ASSESSMENT:   Pt with PMH significant for alcohol abuse DM, HLD, HTN, PAD, squamous cell cancer of the lip, and tobacco abuse. Presents this admission with alcohol withdrawal and aspiration pneumonia with resulting acute respiratory failure.    Pt started on PePup protocol. RD consulted for tube feeding management.  No family at bedside to provide history. Weight noted to be stable.  A limited NFPE completed. Suspect some form of malnutrition but unable to diagnose at this time.   Patient is currently intubated on ventilator support MV: 9.6 L/min Temp (24hrs), Avg:98.2 F (36.8 C), Min:98.2 F (36.8 C), Max:98.2 F (36.8 C) Propofol: None BP: 198/127 MAP: 132  Medications reviewed and include: SSI, precedex, IV abx Labs reviewed: K 2.8 (L) Mg 1.3 (L)   NUTRITION - FOCUSED PHYSICAL EXAM:    Most Recent Value  Orbital Region  Unable to assess  Upper Arm Region  Mild depletion  Thoracic and Lumbar Region  Unable to assess  Buccal Region  Unable to assess  Temple Region  Mild depletion  Clavicle Bone Region  Mild depletion  Clavicle and Acromion Bone Region  Mild depletion  Scapular Bone Region  Unable to assess  Dorsal Hand  Unable to assess   Patellar Region  Moderate depletion  Anterior Thigh Region  Moderate depletion  Posterior Calf Region  Moderate depletion  Edema (RD Assessment)  None  Hair  Reviewed  Eyes  Unable to assess  Mouth  Unable to assess  Skin  Reviewed  Nails  Unable to assess       Diet Order:  Diet NPO time specified  EDUCATION NEEDS:   Not appropriate for education at this time  Skin:  Skin Assessment: Reviewed RN Assessment  Last BM:  PTA  Height:   Ht Readings from Last 1 Encounters:  07/17/17 6' (1.829 m)    Weight:   Wt Readings from Last 1 Encounters:  07/17/17 150 lb (68 kg)    Ideal Body Weight:  80.9 kg  BMI:  Body mass index is 20.34 kg/m.  Estimated Nutritional Needs:   Kcal:  1504 kcal/day (PSU)  Protein:  85-95 kcal/day   Fluid:  >1.5 L/day    Mariana Single RD, LDN Clinical Nutrition Pager # - [phone]

## 2017-07-17 NOTE — Progress Notes (Signed)
[facility] PCCM  Hypotension with sedation, lactic acid slightly elevated. Remains afebrile.    On exam: Sitting up in bed, coughing, ventilated Skin well perfused, no mottling HR improved to 105  Elevated lactic: most likely due to hypoxemia; will give 2L Economy (30cc/kg is about 1800) and repeat lactic acid Sedation need: add fentanyl infusion, prn versed Hypotension> give second liter of saline now, will order neosynephine as needed  Additional CC time 35 minutes  Roselie Awkward, MD [place] PCCM Pager: [phone] Cell: [phone] After 3pm or if no response, call [phone]

## 2017-07-17 NOTE — ED Triage Notes (Signed)
Patient comes in complaining of tremors due to lack of alcohol. Patient states he has no more alcohol. Patient states all he had was cheerwine at the house.

## 2017-07-18 ENCOUNTER — Inpatient Hospital Stay (HOSPITAL_COMMUNITY): Payer: Medicare Other

## 2017-07-18 DIAGNOSIS — F10920 Alcohol use, unspecified with intoxication, uncomplicated: Secondary | ICD-10-CM

## 2017-07-18 LAB — GLUCOSE, CAPILLARY
GLUCOSE-CAPILLARY: 121 mg/dL — AB (ref 65–99)
GLUCOSE-CAPILLARY: 105 mg/dL — AB (ref 65–99)
GLUCOSE-CAPILLARY: 125 mg/dL — AB (ref 65–99)
Glucose-Capillary: 142 mg/dL — ABNORMAL HIGH (ref 65–99)
Glucose-Capillary: 139 mg/dL — ABNORMAL HIGH (ref 65–99)
Glucose-Capillary: 118 mg/dL — ABNORMAL HIGH (ref 65–99)

## 2017-07-18 LAB — LACTIC ACID, PLASMA
LACTIC ACID, VENOUS: 3.2 mmol/L — AB (ref 0.5–1.9)
Lactic Acid, Venous: 2.6 mmol/L (ref 0.5–1.9)
Lactic Acid, Venous: 2.6 mmol/L (ref 0.5–1.9)
Lactic Acid, Venous: 3.3 mmol/L (ref 0.5–1.9)

## 2017-07-18 LAB — PHOSPHORUS
PHOSPHORUS: 4.2 mg/dL (ref 2.5–4.6)
Phosphorus: 4.2 mg/dL (ref 2.5–4.6)

## 2017-07-18 LAB — CBC
HCT: 42.5 % (ref 39.0–52.0)
Hemoglobin: 13.7 g/dL (ref 13.0–17.0)
MCH: 29.7 pg (ref 26.0–34.0)
MCHC: 32.2 g/dL (ref 30.0–36.0)
MCV: 92 fL (ref 78.0–100.0)
Platelets: 134 10*3/uL — ABNORMAL LOW (ref 150–400)
RBC: 4.62 MIL/uL (ref 4.22–5.81)
RDW: 16.7 % — AB (ref 11.5–15.5)
WBC: 12.5 10*3/uL — ABNORMAL HIGH (ref 4.0–10.5)

## 2017-07-18 LAB — BASIC METABOLIC PANEL
Anion gap: 11 (ref 5–15)
BUN: 15 mg/dL (ref 6–20)
CALCIUM: 7.1 mg/dL — AB (ref 8.9–10.3)
CO2: 19 mmol/L — AB (ref 22–32)
CREATININE: 1.37 mg/dL — AB (ref 0.61–1.24)
Chloride: 115 mmol/L — ABNORMAL HIGH (ref 101–111)
GFR calc non Af Amer: 55 mL/min — ABNORMAL LOW (ref >60)
Glucose, Bld: 128 mg/dL — ABNORMAL HIGH (ref 65–99)
Potassium: 4.2 mmol/L (ref 3.5–5.1)
SODIUM: 145 mmol/L (ref 135–145)

## 2017-07-18 LAB — LEGIONELLA PNEUMOPHILA SEROGP 1 UR AG: L. pneumophila Serogp 1 Ur Ag: NEGATIVE

## 2017-07-18 LAB — HIV ANTIBODY (ROUTINE TESTING W REFLEX): HIV SCREEN 4TH GENERATION: NONREACTIVE

## 2017-07-18 LAB — CREATININE, URINE, RANDOM: Creatinine, Urine: 275.04 mg/dL

## 2017-07-18 LAB — MAGNESIUM
MAGNESIUM: 2 mg/dL (ref 1.7–2.4)
Magnesium: 1.7 mg/dL (ref 1.7–2.4)

## 2017-07-18 LAB — SODIUM, URINE, RANDOM: SODIUM UR: 40 mmol/L

## 2017-07-18 MED ORDER — SODIUM CHLORIDE 0.9 % IV BOLUS (SEPSIS)
1000.0000 mL | Freq: Once | INTRAVENOUS | Status: AC
  Administered 2017-07-18: 1000 mL via INTRAVENOUS

## 2017-07-18 MED ORDER — ACETAMINOPHEN 160 MG/5ML PO SOLN
650.0000 mg | Freq: Four times a day (QID) | ORAL | Status: DC | PRN
  Administered 2017-07-18 – 2017-07-21 (×7): 650 mg
  Filled 2017-07-18 (×7): qty 20.3

## 2017-07-18 MED ORDER — VITAL 1.5 CAL PO LIQD
1000.0000 mL | ORAL | Status: DC
  Administered 2017-07-18 – 2017-07-20 (×3): 1000 mL
  Filled 2017-07-18 (×3): qty 1000

## 2017-07-18 MED ORDER — SODIUM CHLORIDE 0.9 % IV BOLUS (SEPSIS)
500.0000 mL | Freq: Once | INTRAVENOUS | Status: AC
  Administered 2017-07-18: 500 mL via INTRAVENOUS

## 2017-07-18 MED ORDER — MAGNESIUM SULFATE 2 GM/50ML IV SOLN
2.0000 g | Freq: Once | INTRAVENOUS | Status: AC
  Administered 2017-07-18: 2 g via INTRAVENOUS
  Filled 2017-07-18: qty 50

## 2017-07-18 NOTE — Progress Notes (Signed)
Nutrition Follow-up  DOCUMENTATION CODES:   Not applicable  INTERVENTION:  - Will change TF regimen: Vital 1.5 @ 55 mL/hr. This regimen will provide 1980 kcal (95% re-estimated kcal need), 89 grams of protein, and 1008 mL free water.   NUTRITION DIAGNOSIS:   Inadequate oral intake related to inability to eat as evidenced by NPO status. -ongoing  GOAL:   Patient will meet greater than or equal to 90% of their needs -met/will be met with TF regimen.   MONITOR:   Vent status, TF tolerance, Weight trends, Labs  ASSESSMENT:   Pt with PMH significant for alcohol abuse DM, HLD, HTN, PAD, squamous cell cancer of the lip, and tobacco abuse. Presents this admission with alcohol withdrawal and aspiration pneumonia with resulting acute respiratory failure.   12/11 Pt remains intubated with OGT in place. He is receiving Vital AF 1.2 @ 55 mL/hr. This regimen is providing 1584 kcal, 99 grams of protein, and 1070 mL free water. Weight noted to be +9 lbs/4.4 kg compared to weight from yesterday. Will use yesterday's weight (68 kg) to re-estimate needs today based on changes in Ve and Tmax.   Patient is currently intubated on ventilator support MV: 8.9 L/min Temp (24hrs), Avg:100 F (37.8 C), Min:97.8 F (36.6 C), Max:102.6 F (39.2 C) BP: 130/99 and MAP: 108   Medications reviewed; 20 mg IV Pepcid BID, sliding scale Novolog, 2 g IV Mg sulfate x1 dose today, 100 mg IV thiamine/day.   Labs reviewed; CBGs: 121 and 105 mg/dL, Cl: 115 mmol/L, creatinine: 1.37 mg/dL, Ca: 7.1 mg/dL, GFR: 55 mL/min.  IVF: NS @ 100 mL/hr. Drips: Precedex @ 1.2 mcg/kg/hr, Fentanyl @ 100 mcg/hr.     12/10 - Pt started on PePup protocol.  - No family at bedside to provide history.  - Weight noted to be stable.  - A limited NFPE completed.  - Suspect some form of malnutrition but unable to confidently at this time.   Patient is currently intubated on ventilator support MV: 9.6 L/min Temp (24hrs), Avg:98.2 F  (36.8 C), Min:98.2 F (36.8 C), Max:98.2 F (36.8 C) Propofol: None BP: 198/127 MAP: 132     Diet Order:  Diet NPO time specified  EDUCATION NEEDS:   Not appropriate for education at this time  Skin:  Skin Assessment: Reviewed RN Assessment  Last BM:  PTA  Height:   Ht Readings from Last 1 Encounters:  07/17/17 6' (1.829 m)    Weight:   Wt Readings from Last 1 Encounters:  07/18/17 159 lb 9.8 oz (72.4 kg)    Ideal Body Weight:  80.91 kg  BMI:  Body mass index is 21.65 kg/m.  Estimated Nutritional Needs:   Kcal:  2086  Protein:  82-102 grams (1.2-1.5 grams/kg)  Fluid:  >1.5 L/day     Jarome Matin, MS, RD, LDN, [facility] Inpatient Clinical Dietitian Pager # [phone] After hours/weekend pager # [phone]

## 2017-07-18 NOTE — Progress Notes (Signed)
CRITICAL VALUE ALERT  Critical Value: lactic acid 2.6  Date & Time Notied:  07/18/2017 0800  Provider Notified: Dr. Lake Bells  Orders Received/Actions taken: no new orders

## 2017-07-18 NOTE — Progress Notes (Signed)
RT went to do patients vent check and patients ETT tube was at 24@ the lip from previous order. RN called CCM to get order for a chest xray .

## 2017-07-18 NOTE — Progress Notes (Signed)
CRITICAL VALUE ALERT  Critical Value:  Lactic acid 2.6  Date & Time Notied:  07/18/2017 1000  Provider Notified: Dr. Lake Bells  Orders Received/Actions taken: no new orders currently

## 2017-07-18 NOTE — Progress Notes (Signed)
PULMONARY  / CRITICAL CARE MEDICINE  Name: RAYJON WERY MRN: [id number] DOB: 02-14-1956    LOS: 3  REFERRING MD :  Dr. Tomi Bamberger  CHIEF COMPLAINT:  Shortness of Breath  BRIEF PATIENT DESCRIPTION:  This is a 61 year old male with a past medical history significant for alcohol abuse who presented to the [facility] long emergency department 12/10 in the setting of alcohol withdrawal.  He stated that he drank a significant amount of vodka per day and his last drink of alcohol was at approximately 11 PM on July 16, 2017, and he started feeling signs and symptoms of withdrawal and by the early morning of July 17, 2017.  He came to the [facility] long emergency department for further care.  There he received some IV Ativan and monitoring but his blood pressure, heart rate, and respiratory status worsened.  The patient was started on BiPAP.  He was given more Ativan.  Pulmonary and critical care medicine was consulted for admission to the intensive care unit.  LINES / TUBES: 12/10 ET Tube   CULTURES: 12/10 Blood Cx >> pending 12/10 Respiratory Cx >> pending 12/10 Urine Strep Antigen >> negative 12/10 Urine Legionella antigen >> pending  ANTIBIOTICS: Unasyn 12/10 >>   SIGNIFICANT EVENTS:   LEVEL OF CARE:  ICU PRIMARY SERVICE:  PCCM CONSULTANTS:   CODE STATUS DIET:  Gastric tube feeds DVT Px:  Lovenox GI Px:  IV Pepcid q12  PAST MEDICAL HISTORY :  Past Medical History:  Diagnosis Date  . Anxiety   . Back pain     The patient has chronic lumbar/thoracic back pain, he has kyphosis, mild facet hypertrophy at L1-L2, circumferential annular bulging and mild vertebral body osteophytic formation at L2-L3, mild to moderate stenosis of the medial aspect of the neural foramen at L4-L5, and mild annular bulging at L5-S1.  . Back pain   . Dental caries   . Diabetes mellitus    borderline yrs ago  . Dyslipidemia   . GERD (gastroesophageal reflux disease)   . Glucose intolerance (impaired  glucose tolerance)    Max random CBG 130 (08/29/07) typically about 100. A1C in 11/08 was 5.4.  . History of alcohol abuse   . Hyperlipidemia   . Hypertension, essential   . Leg pain   . Peripheral arterial disease ([place])   . Peripheral vascular disease ([place])   . 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 Owens Shark).  Pt will likely require a wide resection and  has been refered to ENT (2/12)  . Thoracic kyphosis   . Tobacco abuse   . Ulcer    Past Surgical History:  Procedure Laterality Date  . ABDOMINAL AORTAGRAM N/A 06/11/2012   Procedure: ABDOMINAL Maxcine Ham;  Surgeon: Angelia Mould, MD;  Location: [facility] CATH LAB;  Service: Cardiovascular;  Laterality: N/A;  . AMPUTATION Left 12/06/2012   Procedure: AMPUTATION DIGIT;  Surgeon: Angelia Mould, MD;  Location: [place];  Service: Vascular;  Laterality: Left;  2ND TOE  . AMPUTATION Left 12/10/2012   Procedure: AMPUTATION BELOW KNEE ;  Surgeon: Rosetta Posner, MD;  Location: [place];  Service: Vascular;  Laterality: Left;  . BYPASS GRAFT FEMORAL-PERONEAL  06/12/2012   Procedure: BYPASS GRAFT FEMORAL-PERONEAL;  Surgeon: Angelia Mould, MD;  Location: [facility] OR;  Service: Vascular;  Laterality: Left;  Redo fermoral - peroneal artery bypass using     composite propaten 28mm x  80cm and left saphenous vein.  Marland Kitchen BYPASS GRAFT FEMORAL-PERONEAL Left 11/10/2012   Procedure: Thrombectomy and Revision Left FEMORAL-PERONEAL Bypass Graft;  Surgeon: Rosetta Posner, MD;  Location: [place];  Service: Vascular;  Laterality: Left;  . CAROTID ENDARTERECTOMY    . [place]   secondary to a forklift acciednt and crushed pelvis  . PR VEIN BYPASS GRAFT,AORTO-FEM-POP  2003   Dr. Amedeo Plenty  . PR VEIN BYPASS GRAFT,AORTO-FEM-POP  05/12/11   Left fem-peroneal BPG    Prior to Admission medications   Medication Sig Start Date End Date Taking? Authorizing Provider  aspirin EC 81 MG  tablet Take 1 tablet (81 mg total) by mouth daily. 12/21/12  Yes Angiulli, Lavon Paganini, PA-C  Vitamins-Lipotropics (B COMPLEX FORMULA 1 PO) Take 1 tablet by mouth daily.     Yes [provider]  chlordiazePOXIDE (LIBRIUM) 25 MG capsule 50mg  PO TID x 1D, then 50mg  PO BID X 1D, then 50mg  PO QD X 1D 32/35/57   Delora Fuel, MD  lisinopril (PRINIVIL,ZESTRIL) 20 MG tablet Take 1 tablet (20 mg total) by mouth daily. 32/20/25   Delora Fuel, MD  magnesium oxide (MAG-OX) 400 (241.3 Mg) MG tablet Take 1 tablet (400 mg total) by mouth 2 (two) times daily. 42/70/62   Delora Fuel, MD  Potassium Chloride ER 20 MEQ TBCR Take 20 mEq by mouth 2 (two) times daily. 37/62/83   Delora Fuel, MD  simvastatin (ZOCOR) 40 MG tablet Take 1 tablet (40 mg total) by mouth at bedtime. 15/17/61   Delora Fuel, MD   No Known Allergies  FAMILY HISTORY:  Family History  Problem Relation Age of Onset  . Heart disease Mother   . Cancer Mother   . Alcohol abuse Father   . Heart disease Father   . Stroke Father    SOCIAL HISTORY:  reports that he has been smoking cigarettes.  He has a 20.00 pack-year smoking history. he has never used smokeless tobacco. He reports that he drinks alcohol. He reports that he does not use drugs.  REVIEW OF SYSTEMS:  Per HPI    INTERVAL HISTORY: Sedated this AM, mechanical ventilation   VITAL SIGNS: Temp:  [97.8 F (36.6 C)-102.6 F (39.2 C)] 102.6 F (39.2 C) (12/11 0405) Pulse Rate:  [78-148] 88 (12/11 0800) Resp:  [8-45] 14 (12/11 0800) BP: (60-198)/(41-148) 126/101 (12/11 0800) SpO2:  [89 %-100 %] 99 % (12/11 0819) FiO2 (%):  [50 %-60 %] 50 % (12/11 0819) Weight:  [159 lb 9.8 oz (72.4 kg)] 159 lb 9.8 oz (72.4 kg) (12/11 0345) HEMODYNAMICS:   VENTILATOR SETTINGS: Vent Mode: PRVC FiO2 (%):  [50 %-60 %] 50 % Set Rate:  [14 bmp] 14 bmp Vt Set:  [620 mL] 620 mL PEEP:  [10 cmH20] 10 cmH20 Plateau Pressure:  [19 cmH20-22 cmH20] 21 cmH20 INTAKE / OUTPUT: Intake/Output       12/10 0701 - 12/11 0700 12/11 0701 - 12/12 0700   I.V. (mL/kg) 631.3 (8.7)    Other 1200    NG/GT 0    IV Piggyback 3066.7    Total Intake(mL/kg) 4897.9 (67.7)    Urine (mL/kg/hr) 1360 (0.8)    Emesis/NG output 850    Total Output 2210    Net +2687.9           PHYSICAL EXAMINATION: General:  Sedated, intubated Neuro:  Responds to paiful stimuli HEENT:  ET tube in place, gastric tube Cardiovascular:  RRR, no murmur Lungs:  CTAB Abdomen:  Soft, non tender, +BS Musculoskeletal:  S/p left BKA Skin:  No rash or lesions   LABS: Cbc Recent Labs  Lab 07/17/17 0420 07/18/17 0350  WBC 6.9 12.5*  HGB 13.5 13.7  HCT 40.9 42.5  PLT 251 134*    Chemistry  Recent Labs  Lab 07/17/17 0420 07/17/17 1342 07/17/17 1838 07/18/17 0350  NA 145 142  --  145  K 2.8* 3.0*  --  4.2  CL 107 104  --  115*  CO2 27 26  --  19*  BUN 10 9  --  15  CREATININE 0.73 0.89  --  1.37*  CALCIUM 8.2* 7.7*  --  7.1*  MG 1.3* 1.5* 1.4* 2.0  PHOS  --  3.9 4.3 4.2  GLUCOSE 103* 213*  --  128*    Liver fxn Recent Labs  Lab 07/17/17 0420  AST 50*  ALT 45  ALKPHOS 91  BILITOT 0.5  PROT 6.1*  ALBUMIN 3.3*   coags No results for input(s): APTT, INR in the last 168 hours. Sepsis markers Recent Labs  Lab 07/17/17 2357 07/18/17 0350 07/18/17 0700  LATICACIDVEN 3.3* 3.2* 2.6*   Cardiac markers No results for input(s): CKTOTAL, CKMB, TROPONINI in the last 168 hours. BNP No results for input(s): PROBNP in the last 168 hours. ABG Recent Labs  Lab 07/17/17 1417  PHART 7.380  PCO2ART 44.4  PO2ART 79.1*  HCO3 25.7    CBG trend Recent Labs  Lab 07/17/17 1721 07/17/17 2000 07/17/17 2331 07/18/17 0400 07/18/17 0806  GLUCAP 149* 149* 98 121* 105*    IMAGING: 07/19/11 CXR: IMPRESSION: 1. Endotracheal tube seen ending 8 cm above the carina. This be advanced 4 cm, as deemed clinically appropriate. 2. Enteric tube noted extending below the diaphragm. 3. Bibasilar airspace  opacities may reflect atelectasis or pneumonia. Small bilateral pleural effusions noted.  ECG:  DISCUSSION: 61 year old male with a past medical history significant for alcohol abuse presented to the [facility] long emergency department with complaints of a feeling that he was going into alcohol withdrawal.  Developed acute respiratory failure with hypoxemia after admission.  This is likely due to a right lower lobe aspiration pneumonia.  ASSESSMENT / PLAN:  PULMONARY A: Acute respiratory failure with hypoxemia Wheezing Aspiration pneumonia P:   ICU Intubated 12/10 Mechanical ventilatory support Daily wakeup assessment and spontaneous breathing trial CXR today with bibasilar opacities  Ventilator associated pneumonia prevention protocol  CARDIOVASCULAR A:  Hypertension due to alcohol withdrawal Tachycardia due to alcohol withdrawal P:  Telemetry monitoring As needed hydralazine Monitor hemodynamics in ICU setting Follow lactic acid  RENAL A:   AKI, decreased UOP - possible ATN vs. Prerenal  Hypokalemia resolved P:   Monitor BMET and UOP  Replace electrolytes as needed Appears euvolemic, will check FENa  GASTROINTESTINAL A:   High risk for re-feeding syndrome P:   Continue tube feeding pepcid for stress ulcer prophylaxis Monitor K, Mg, Phos closely   HEMATOLOGIC A:   No acute issues P:  Monitor for bleeding Lovenox for DVT prophylaxis  INFECTIOUS A:   Aspiration pneumonia, febrile this AM to 102.6, new leukocytosis  P:   Continue Unasyn started 12/10 12/10 Blood Cx >> Pending 12/10 Resp Cx >> Pending   ENDOCRINE A:   DM2      P:   SSI q4h  NEUROLOGIC A:   Acute encephalopathy Alcohol withdrawal P:   RASS goal: -2 Precedex ggt  Fentanyl prn Versed prn Thiamine daily   FAMILY  -  Updates: none bedside  - Inter-disciplinary family meet or Palliative Care meeting due by:  day 7  Velna Ochs, M.D. - PGY2 Pager:  [phone] 07/18/2017, 9:14 AM

## 2017-07-18 NOTE — Progress Notes (Signed)
eLink Physician-Brief Progress Note Patient Name: CARSON BOGDEN DOB: March 14, 1956 MRN: [id number]   Date of Service  07/18/2017  HPI/Events of Note  Multiple issues: 1. Lactic Acid = 3.3 --> 3.2 and 2. Oliguria.   eICU Interventions  Will bolus with 0.9 NaCl 1 liter IV over 1 hour now.      Intervention Category Major Interventions: Acid-Base disturbance - evaluation and management Intermediate Interventions: Oliguria - evaluation and management  Sommer,Steven Eugene 07/18/2017, 5:19 AM

## 2017-07-18 NOTE — Progress Notes (Signed)
eLink Physician-Brief Progress Note Patient Name: Andrew [name] DOB: 15-Oct-1955 MRN: [id number]   Date of Service  07/18/2017  HPI/Events of Note  Request to review CXR to assess ETT placement. ETT tip 8 cm above the carina.   eICU Interventions  Will order: 1. Advance ETT 4 cm and resecure.      Intervention Category Major Interventions: Other:  Sommer,Steven Cornelia Copa 07/18/2017, 5:04 AM

## 2017-07-18 NOTE — Care Management Note (Signed)
Case Management Note  Patient Details  Name: Andrew [name] MRN: [id number] Date of Birth: 20-Feb-1956  Subjective/Objective:                  Hypotension with vent and sedation  Action/Plan: Date: July 18, 2017 Velva Harman, BSN, [place], [place] Chart and notes review for patient progress and needs. Will follow for case management and discharge needs. Next review date: [id number]  Expected Discharge Date:  (unknown)               Expected Discharge Plan:  Home/Self Care  In-House Referral:  Clinical Social Work  Discharge planning Services     Post Acute Care Choice:    Choice offered to:     DME Arranged:    DME Agency:     HH Arranged:    [place] Agency:     Status of Service:  In process, will continue to follow  If discussed at Long Length of Stay Meetings, dates discussed:    Additional Comments:  Leeroy Cha, RN 07/18/2017, 8:07 AM

## 2017-07-18 NOTE — Progress Notes (Signed)
eLink Physician-Brief Progress Note Patient Name: Andrew [name] DOB: 08/16/1955 MRN: [id number]   Date of Service  07/18/2017  HPI/Events of Note  Multiple issues: 1. Lactic Acid = 3.1 --> 3.3. BP = 104/85. No CVP. Last LVEF = estimated at 50% and 2. Mg++ = 1.4 and Creatinine = 0.89.  eICU Interventions  Will order: 1. Replace Mg++. 2. Bolus with 0.9 NaCl 1 liter IV over 1 hour now.  3. Continue to Trend Lactic Acid.      Intervention Category Major Interventions: Electrolyte abnormality - evaluation and management;Acid-Base disturbance - evaluation and management  [name] 07/18/2017, 1:23 AM

## 2017-07-18 NOTE — Progress Notes (Signed)
CRITICAL VALUE ALERT  Critical Value:  Lactic acid 3.2, temp 102.6  Date & Time Notied: 07/18/17 @ 0445  Provider Notified: Dr. Oletta Darter  Orders Received/Actions taken: 1L NS bolus  Physician also notified of decreased urine output

## 2017-07-19 DIAGNOSIS — R6521 Severe sepsis with septic shock: Secondary | ICD-10-CM

## 2017-07-19 DIAGNOSIS — N179 Acute kidney failure, unspecified: Secondary | ICD-10-CM

## 2017-07-19 DIAGNOSIS — A419 Sepsis, unspecified organism: Secondary | ICD-10-CM

## 2017-07-19 LAB — CBC WITH DIFFERENTIAL/PLATELET
BASOS ABS: 0 10*3/uL (ref 0.0–0.1)
Basophils Relative: 0 %
EOS PCT: 0 %
Eosinophils Absolute: 0 10*3/uL (ref 0.0–0.7)
HCT: 40.9 % (ref 39.0–52.0)
HEMOGLOBIN: 12.4 g/dL — AB (ref 13.0–17.0)
LYMPHS PCT: 11 %
Lymphs Abs: 1.8 10*3/uL (ref 0.7–4.0)
MCH: 28.8 pg (ref 26.0–34.0)
MCHC: 30.3 g/dL (ref 30.0–36.0)
MCV: 94.9 fL (ref 78.0–100.0)
Monocytes Absolute: 0.8 10*3/uL (ref 0.1–1.0)
Monocytes Relative: 5 %
NEUTROS PCT: 83 %
Neutro Abs: 12.8 10*3/uL — ABNORMAL HIGH (ref 1.7–7.7)
PLATELETS: 58 10*3/uL — AB (ref 150–400)
RBC: 4.31 MIL/uL (ref 4.22–5.81)
RDW: 17 % — ABNORMAL HIGH (ref 11.5–15.5)
WBC: 15.4 10*3/uL — AB (ref 4.0–10.5)

## 2017-07-19 LAB — GLUCOSE, CAPILLARY
GLUCOSE-CAPILLARY: 126 mg/dL — AB (ref 65–99)
GLUCOSE-CAPILLARY: 118 mg/dL — AB (ref 65–99)
GLUCOSE-CAPILLARY: 108 mg/dL — AB (ref 65–99)
Glucose-Capillary: 93 mg/dL (ref 65–99)
Glucose-Capillary: 142 mg/dL — ABNORMAL HIGH (ref 65–99)
Glucose-Capillary: 108 mg/dL — ABNORMAL HIGH (ref 65–99)

## 2017-07-19 LAB — BASIC METABOLIC PANEL
ANION GAP: 9 (ref 5–15)
BUN: 38 mg/dL — ABNORMAL HIGH (ref 6–20)
CO2: 19 mmol/L — ABNORMAL LOW (ref 22–32)
Calcium: 7 mg/dL — ABNORMAL LOW (ref 8.9–10.3)
Chloride: 119 mmol/L — ABNORMAL HIGH (ref 101–111)
Creatinine, Ser: 2.91 mg/dL — ABNORMAL HIGH (ref 0.61–1.24)
GFR calc Af Amer: 26 mL/min — ABNORMAL LOW (ref >60)
GFR, EST NON AFRICAN AMERICAN: 22 mL/min — AB (ref >60)
GLUCOSE: 123 mg/dL — AB (ref 65–99)
POTASSIUM: 4 mmol/L (ref 3.5–5.1)
SODIUM: 147 mmol/L — AB (ref 135–145)

## 2017-07-19 LAB — DIC (DISSEMINATED INTRAVASCULAR COAGULATION)PANEL
D-Dimer, Quant: 15.92 ug/mL-FEU — ABNORMAL HIGH (ref 0.00–0.50)
Prothrombin Time: 28 seconds — ABNORMAL HIGH (ref 11.4–15.2)
Smear Review: NONE SEEN

## 2017-07-19 LAB — MRSA PCR SCREENING: MRSA by PCR: NEGATIVE

## 2017-07-19 LAB — LACTIC ACID, PLASMA: Lactic Acid, Venous: 2.4 mmol/L (ref 0.5–1.9)

## 2017-07-19 LAB — DIC (DISSEMINATED INTRAVASCULAR COAGULATION) PANEL
APTT: 50 s — AB (ref 24–36)
FIBRINOGEN: 322 mg/dL (ref 210–475)
INR: 2.64
PLATELETS: 42 10*3/uL — AB (ref 150–400)

## 2017-07-19 LAB — MAGNESIUM: MAGNESIUM: 1.8 mg/dL (ref 1.7–2.4)

## 2017-07-19 MED ORDER — SODIUM CHLORIDE 0.9 % IV SOLN
3.0000 g | Freq: Two times a day (BID) | INTRAVENOUS | Status: DC
  Administered 2017-07-19 – 2017-07-21 (×4): 3 g via INTRAVENOUS
  Filled 2017-07-19 (×4): qty 3

## 2017-07-19 MED ORDER — SODIUM CHLORIDE 0.9 % IV BOLUS (SEPSIS)
1000.0000 mL | Freq: Once | INTRAVENOUS | Status: AC
  Administered 2017-07-19: 1000 mL via INTRAVENOUS

## 2017-07-19 MED ORDER — FAMOTIDINE IN NACL 20-0.9 MG/50ML-% IV SOLN
20.0000 mg | INTRAVENOUS | Status: DC
  Administered 2017-07-19 – 2017-07-22 (×4): 20 mg via INTRAVENOUS
  Filled 2017-07-19 (×4): qty 50

## 2017-07-19 MED ORDER — SODIUM CHLORIDE 0.9 % IV SOLN
250.0000 mL | INTRAVENOUS | Status: DC | PRN
  Administered 2017-07-19: 250 mL via INTRAVENOUS

## 2017-07-19 MED ORDER — SODIUM CHLORIDE 0.9 % IV BOLUS (SEPSIS): 1000.0000 mL | Freq: Once | INTRAVENOUS | Status: AC

## 2017-07-19 NOTE — Progress Notes (Signed)
PHARMACY NOTE:  ANTIMICROBIAL RENAL DOSAGE ADJUSTMENT  Current antimicrobial regimen includes a mismatch between antimicrobial dosage and estimated renal function.  As per policy approved by the Pharmacy & Therapeutics and Medical Executive Committees, the antimicrobial dosage will be adjusted accordingly.  Current antimicrobial dosage:  Unasyn 3g IV q6h  Indication: aspiration pneumonia  Renal Function: Estimated Creatinine Clearance: 29.8 mL/min (A) (by C-G formula based on SCr of 2.91 mg/dL (H)).    Antimicrobial dosage has been changed to:  Unasyn 3g IV q12h  Additional comments: pharmacy will continue to monitor and adjust as renal function changes.  Will also adjust famotidine 20 mg q12h to 20 mg q24h for declining renal function.  Thank you for allowing pharmacy to be a part of this patient's care.  Hershal Coria, [facility] 07/19/2017 8:31 AM

## 2017-07-19 NOTE — Progress Notes (Signed)
eLink Physician-Brief Progress Note Patient Name: Andrew [name] DOB: July 24, 1956 MRN: [id number]   Date of Service  07/19/2017  HPI/Events of Note  Request to order AM labs.   eICU Interventions  Will order: 1. CBC with platelets, BMP and Mg++ level now.      Intervention Category Major Interventions: Other:  [name] 07/19/2017, 5:19 AM

## 2017-07-19 NOTE — Progress Notes (Signed)
PULMONARY  / CRITICAL CARE MEDICINE  Name: Andrew [name] MRN: [id number] DOB: July 26, 1956    LOS: 2  REFERRING MD :  Dr. Tomi Bamberger  CHIEF COMPLAINT:  Shortness of Breath  BRIEF PATIENT DESCRIPTION:  This is a 61 year old male with a past medical history significant for alcohol abuse who presented to the [facility] long emergency department 12/10 in the setting of alcohol withdrawal.  He stated that he drank a significant amount of vodka per day and his last drink of alcohol was at approximately 11 PM on July 16, 2017, and he started feeling signs and symptoms of withdrawal and by the early morning of July 17, 2017.  He came to the [facility] long emergency department for further care.  There he received some IV Ativan and monitoring but his blood pressure, heart rate, and respiratory status worsened.  The patient was started on BiPAP.  He was given more Ativan.  Pulmonary and critical care medicine was consulted for admission to the intensive care unit.  INTERVAL HISTORY: Sedated this AM, agitated on vent.   LINES / TUBES: 12/10 ET Tube   CULTURES: 12/10 Blood Cx >> NG x 24 hours 12/10 Respiratory Cx >> pending, gram stain polymicrobial  12/10 Urine Strep Antigen >> negative 12/10 Urine Legionella antigen >> pending  ANTIBIOTICS: Unasyn 12/10 >>   SIGNIFICANT EVENTS:   LEVEL OF CARE:  ICU PRIMARY SERVICE:  PCCM CONSULTANTS:   CODE STATUS DIET:  Gastric tube feeds DVT Px:  Lovenox GI Px:  IV Pepcid q12  PAST MEDICAL HISTORY :  Past Medical History:  Diagnosis Date  . Anxiety   . Back pain     The patient has chronic lumbar/thoracic back pain, he has kyphosis, mild facet hypertrophy at L1-L2, circumferential annular bulging and mild vertebral body osteophytic formation at L2-L3, mild to moderate stenosis of the medial aspect of the neural foramen at L4-L5, and mild annular bulging at L5-S1.  . Back pain   . Dental caries   . Diabetes mellitus    borderline yrs ago  .  Dyslipidemia   . GERD (gastroesophageal reflux disease)   . Glucose intolerance (impaired glucose tolerance)    Max random CBG 130 (08/29/07) typically about 100. A1C in 11/08 was 5.4.  . History of alcohol abuse   . Hyperlipidemia   . Hypertension, essential   . Leg pain   . Peripheral arterial disease ([place])   . Peripheral vascular disease ([place])   . 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 Owens Shark).  Pt will likely require a wide resection and  has been refered to ENT (2/12)  . Thoracic kyphosis   . Tobacco abuse   . Ulcer    Past Surgical History:  Procedure Laterality Date  . ABDOMINAL AORTAGRAM N/A 06/11/2012   Procedure: ABDOMINAL Maxcine Ham;  Surgeon: Angelia Mould, MD;  Location: [facility] CATH LAB;  Service: Cardiovascular;  Laterality: N/A;  . AMPUTATION Left 12/06/2012   Procedure: AMPUTATION DIGIT;  Surgeon: Angelia Mould, MD;  Location: [place];  Service: Vascular;  Laterality: Left;  2ND TOE  . AMPUTATION Left 12/10/2012   Procedure: AMPUTATION BELOW KNEE ;  Surgeon: Rosetta Posner, MD;  Location: [place];  Service: Vascular;  Laterality: Left;  . BYPASS GRAFT FEMORAL-PERONEAL  06/12/2012   Procedure: BYPASS GRAFT [place] Stall;  Surgeon: Angelia Mould, MD;  Location: [place];  Service: Vascular;  Laterality:  Left;  Redo fermoral - peroneal artery bypass using     composite propaten 52mm x 80cm and left saphenous vein.  Marland Kitchen BYPASS GRAFT FEMORAL-PERONEAL Left 11/10/2012   Procedure: Thrombectomy and Revision Left FEMORAL-PERONEAL Bypass Graft;  Surgeon: Rosetta Posner, MD;  Location: [place];  Service: Vascular;  Laterality: Left;  . CAROTID ENDARTERECTOMY    . [place]   secondary to a forklift acciednt and crushed pelvis  . PR VEIN BYPASS GRAFT,AORTO-FEM-POP  2003   Dr. Amedeo Plenty  . PR VEIN BYPASS GRAFT,AORTO-FEM-POP  05/12/11   Left fem-peroneal BPG    Prior to Admission  medications   Medication Sig Start Date End Date Taking? Authorizing Provider  aspirin EC 81 MG tablet Take 1 tablet (81 mg total) by mouth daily. 12/21/12  Yes Angiulli, Lavon Paganini, PA-C  Vitamins-Lipotropics (B COMPLEX FORMULA 1 PO) Take 1 tablet by mouth daily.     Yes [provider]  chlordiazePOXIDE (LIBRIUM) 25 MG capsule 50mg  PO TID x 1D, then 50mg  PO BID X 1D, then 50mg  PO QD X 1D 33/00/76   Delora Fuel, MD  lisinopril (PRINIVIL,ZESTRIL) 20 MG tablet Take 1 tablet (20 mg total) by mouth daily. 22/63/33   Delora Fuel, MD  magnesium oxide (MAG-OX) 400 (241.3 Mg) MG tablet Take 1 tablet (400 mg total) by mouth 2 (two) times daily. 54/56/25   Delora Fuel, MD  Potassium Chloride ER 20 MEQ TBCR Take 20 mEq by mouth 2 (two) times daily. 63/89/37   Delora Fuel, MD  simvastatin (ZOCOR) 40 MG tablet Take 1 tablet (40 mg total) by mouth at bedtime. 34/28/76   Delora Fuel, MD   No Known Allergies  FAMILY HISTORY:  Family History  Problem Relation Age of Onset  . Heart disease Mother   . Cancer Mother   . Alcohol abuse Father   . Heart disease Father   . Stroke Father    SOCIAL HISTORY:  reports that he has been smoking cigarettes.  He has a 20.00 pack-year smoking history. he has never used smokeless tobacco. He reports that he drinks alcohol. He reports that he does not use drugs.  REVIEW OF SYSTEMS:  Per HPI   VITAL SIGNS: Temp:  [98.8 F (37.1 C)-103 F (39.4 C)] 103 F (39.4 C) (12/12 0800) Pulse Rate:  [77-88] 85 (12/12 0600) Resp:  [0-27] 15 (12/12 0800) BP: (88-130)/(67-99) 93/67 (12/12 0800) SpO2:  [97 %-100 %] 100 % (12/12 0800) FiO2 (%):  [40 %] 40 % (12/12 0800) Weight:  [169 lb 15.6 oz (77.1 kg)] 169 lb 15.6 oz (77.1 kg) (12/12 0530) HEMODYNAMICS:   VENTILATOR SETTINGS: Vent Mode: PRVC FiO2 (%):  [40 %] 40 % Set Rate:  [14 bmp] 14 bmp Vt Set:  [620 mL] 620 mL PEEP:  [5 cmH20-10 cmH20] 5 cmH20 Plateau Pressure:  [18 cmH20] 18 cmH20 INTAKE /  OUTPUT: Intake/Output      12/11 0701 - 12/12 0700 12/12 0701 - 12/13 0700   I.V. (mL/kg) 3185.4 (41.3) 406.2 (5.3)   Other     NG/GT 1370 240   IV Piggyback 900    Total Intake(mL/kg) 5455.4 (70.8) 646.2 (8.4)   Urine (mL/kg/hr) 350 (0.2)    Emesis/NG output     Total Output 350    Net +5105.4 +646.2          PHYSICAL EXAMINATION: General:  Sedated, intubated Neuro:  Responds to paiful stimuli HEENT:  ET tube in place, gastric tube Cardiovascular:  RRR,  no murmur Lungs:  CTAB Abdomen:  Soft, non tender, +BS Musculoskeletal:  S/p left BKA Skin:  No rash or lesions   LABS: Cbc Recent Labs  Lab 07/17/17 0420 07/18/17 0350 07/19/17 0554  WBC 6.9 12.5* 15.4*  HGB 13.5 13.7 12.4*  HCT 40.9 42.5 40.9  PLT 251 134* 58*    Chemistry  Recent Labs  Lab 07/17/17 1342 07/17/17 1838 07/18/17 0350 07/18/17 1650 07/19/17 0554  NA 142  --  145  --  147*  K 3.0*  --  4.2  --  4.0  CL 104  --  115*  --  119*  CO2 26  --  19*  --  19*  BUN 9  --  15  --  38*  CREATININE 0.89  --  1.37*  --  2.91*  CALCIUM 7.7*  --  7.1*  --  7.0*  MG 1.5* 1.4* 2.0 1.7 1.8  PHOS 3.9 4.3 4.2 4.2  --   GLUCOSE 213*  --  128*  --  123*    Liver fxn Recent Labs  Lab 07/17/17 0420  AST 50*  ALT 45  ALKPHOS 91  BILITOT 0.5  PROT 6.1*  ALBUMIN 3.3*   coags No results for input(s): APTT, INR in the last 168 hours. Sepsis markers Recent Labs  Lab 07/18/17 0350 07/18/17 0700 07/18/17 1011  LATICACIDVEN 3.2* 2.6* 2.6*   Cardiac markers No results for input(s): CKTOTAL, CKMB, TROPONINI in the last 168 hours. BNP No results for input(s): PROBNP in the last 168 hours. ABG Recent Labs  Lab 07/17/17 1417  PHART 7.380  PCO2ART 44.4  PO2ART 79.1*  HCO3 25.7    CBG trend Recent Labs  Lab 07/18/17 1551 07/18/17 1936 07/18/17 2318 07/19/17 0347 07/19/17 0734  GLUCAP 139* 118* 125* 126* 93    IMAGING: 07/19/11 CXR: IMPRESSION: 1. Endotracheal tube seen ending 8 cm  above the carina. This be advanced 4 cm, as deemed clinically appropriate. 2. Enteric tube noted extending below the diaphragm. 3. Bibasilar airspace opacities may reflect atelectasis or pneumonia. Small bilateral pleural effusions noted.  DISCUSSION: 61 year old male with a past medical history significant for alcohol abuse presented to the [facility] long emergency department with complaints of a feeling that he was going into alcohol withdrawal.  Developed acute respiratory failure with hypoxemia after admission felt to be secondary to a right lower lobe aspiration pneumonia. Unfortunately patient is persistently febrile now with oliguric renal failure and new thrombocytopenia.   ASSESSMENT / PLAN:  PULMONARY A: Acute respiratory failure with hypoxemia Wheezing Aspiration pneumonia P:   ICU Intubated 12/10 Mechanical ventilatory support Daily wakeup assessment and spontaneous breathing trial CXR with bibasilar opacities  Ventilator associated pneumonia prevention protocol  CARDIOVASCULAR A:  Hemodynamically stable Tachycardia due to alcohol withdrawal has improved P:  Telemetry monitoring As needed hydralazine Monitor hemodynamics in ICU setting Follow lactic acid  RENAL A:   Acute Renal Failure w/ oliguria, FENa consistent with pre-renal etiology   P:   Monitor BMET and UOP  Replace electrolytes as needed Appears euvolemic, will continue with IV maintenance fluids & challenge with 1L NS bolus Monitor fluid status, Strict I&Os  GASTROINTESTINAL A:   High risk for re-feeding syndrome P:   Continue tube feeding pepcid for stress ulcer prophylaxis Monitor K, Mg, Phos closely   HEMATOLOGIC A:   New thrombocytopenia, no evidence of bleeding  P:  Hold lovenox for VTE ppx  SCDs Intermediate 4T score, will send HIT ab F/u DIC  panel  Monitor for bleeding  Follow platelet count   INFECTIOUS A:   Aspiration pneumonia, persistently febrile today 103F,  worsening leukocytosis  P:   Continue Unasyn started 12/10 12/10 Blood Cx >> NG x 24 hours 12/10 Resp Cx >> gram stain polymicrobial, final read pending   ENDOCRINE A:   DM2      P:   SSI q4h Monitor CBG  NEUROLOGIC A:   Acute encephalopathy Alcohol withdrawal P:   RASS goal: -2 Precedex ggt  Fentanyl prn Versed prn Thiamine daily  Velna Ochs, M.D. - PGY2 Pager: [phone] 07/19/2017, 8:27 AM

## 2017-07-19 NOTE — Progress Notes (Addendum)
Per MD order- advance ETT 2 cm. RT was able to advance 1 cm from 27cm at top lip to 28cm top lip(28cm is as far as ETT can be advanced due to ETT is approximately 28cm long- note left for MD). Currently receiving full VT, Sp02 97%, BBS.

## 2017-07-20 ENCOUNTER — Inpatient Hospital Stay (HOSPITAL_COMMUNITY): Payer: Medicare Other

## 2017-07-20 DIAGNOSIS — F10230 Alcohol dependence with withdrawal, uncomplicated: Secondary | ICD-10-CM

## 2017-07-20 LAB — CULTURE, RESPIRATORY W GRAM STAIN: Culture: NORMAL

## 2017-07-20 LAB — BASIC METABOLIC PANEL
Anion gap: 8 (ref 5–15)
BUN: 54 mg/dL — AB (ref 6–20)
CALCIUM: 6.9 mg/dL — AB (ref 8.9–10.3)
CO2: 20 mmol/L — ABNORMAL LOW (ref 22–32)
Chloride: 120 mmol/L — ABNORMAL HIGH (ref 101–111)
Creatinine, Ser: 4.54 mg/dL — ABNORMAL HIGH (ref 0.61–1.24)
GFR calc Af Amer: 15 mL/min — ABNORMAL LOW (ref >60)
GFR, EST NON AFRICAN AMERICAN: 13 mL/min — AB (ref >60)
GLUCOSE: 121 mg/dL — AB (ref 65–99)
Potassium: 3.8 mmol/L (ref 3.5–5.1)
Sodium: 148 mmol/L — ABNORMAL HIGH (ref 135–145)

## 2017-07-20 LAB — CBC
HCT: 38.4 % — ABNORMAL LOW (ref 39.0–52.0)
Hemoglobin: 11.5 g/dL — ABNORMAL LOW (ref 13.0–17.0)
MCH: 29 pg (ref 26.0–34.0)
MCHC: 29.9 g/dL — AB (ref 30.0–36.0)
MCV: 96.7 fL (ref 78.0–100.0)
PLATELETS: 33 10*3/uL — AB (ref 150–400)
RBC: 3.97 MIL/uL — ABNORMAL LOW (ref 4.22–5.81)
RDW: 17.1 % — AB (ref 11.5–15.5)
WBC: 16 10*3/uL — ABNORMAL HIGH (ref 4.0–10.5)

## 2017-07-20 LAB — LACTIC ACID, PLASMA
Lactic Acid, Venous: 3.7 mmol/L (ref 0.5–1.9)
Lactic Acid, Venous: 2.6 mmol/L (ref 0.5–1.9)

## 2017-07-20 LAB — CORTISOL: CORTISOL PLASMA: 21.4 ug/dL

## 2017-07-20 LAB — C DIFFICILE QUICK SCREEN W PCR REFLEX
C Diff antigen: NEGATIVE
C Diff interpretation: NOT DETECTED
C Diff toxin: NEGATIVE

## 2017-07-20 LAB — GLUCOSE, CAPILLARY
GLUCOSE-CAPILLARY: 109 mg/dL — AB (ref 65–99)
GLUCOSE-CAPILLARY: 87 mg/dL (ref 65–99)
Glucose-Capillary: 95 mg/dL (ref 65–99)
Glucose-Capillary: 115 mg/dL — ABNORMAL HIGH (ref 65–99)
Glucose-Capillary: 128 mg/dL — ABNORMAL HIGH (ref 65–99)

## 2017-07-20 LAB — HEPARIN INDUCED PLATELET AB (HIT ANTIBODY): HEPARIN INDUCED PLT AB: 0.161 {OD_unit} (ref 0.000–0.400)

## 2017-07-20 LAB — CULTURE, RESPIRATORY

## 2017-07-20 LAB — PROCALCITONIN: Procalcitonin: 38.49 ng/mL

## 2017-07-20 MED ORDER — VITAL 1.5 CAL PO LIQD
1000.0000 mL | ORAL | Status: DC
Start: 2017-07-20 — End: 2017-07-24
  Administered 2017-07-20 – 2017-07-24 (×6): 1000 mL
  Filled 2017-07-20 (×8): qty 1000

## 2017-07-20 MED ORDER — LOPERAMIDE HCL 1 MG/5ML PO LIQD
1.0000 mg | ORAL | Status: DC | PRN
  Administered 2017-07-25: 1 mg
  Filled 2017-07-20: qty 5

## 2017-07-20 MED ORDER — DEXTROSE 5 % IV SOLN
INTRAVENOUS | Status: DC
  Administered 2017-07-20 – 2017-07-22 (×5): via INTRAVENOUS

## 2017-07-20 NOTE — Progress Notes (Signed)
Nutrition Follow-up  DOCUMENTATION CODES:   Not applicable  INTERVENTION:  - When TF restarted, plan for new goal rate: Vital 1.5 @ 60 mL/hr which will provide 2160 kcal (97% re-estimated kcal need), 97 grams of protein, and 1100 mL free water. - PEPuP protocol.   NUTRITION DIAGNOSIS:   Inadequate oral intake related to inability to eat as evidenced by NPO status. -ongoing  GOAL:   Patient will meet greater than or equal to 90% of their needs -met with TF regimen.   MONITOR:   Vent status, TF tolerance, Weight trends, Labs  ASSESSMENT:   Pt with PMH significant for alcohol abuse DM, HLD, HTN, PAD, squamous cell cancer of the lip, and tobacco abuse. Presents this admission with alcohol withdrawal and aspiration pneumonia with resulting acute respiratory failure.   12/13 Pt remains intubated with OGT in place. Current order for Vital 1.5 @ 55 mL/hr which provides 1980 kcal, 89 grams of protein, and 1008 mL free water. RN turned TF off around 10:30 AM in preparation for renal ultrasound today. She reports that diarrhea began yesterday/overnight. C. Diff was negative and PCCM NP note states plan to start imodium. Weight + 21 lbs/9.3 kg compared to weight on 12/11 so will use weight from 12/11 (72.4 kg) to re-estimate needs.   Patient is currently intubated on ventilator support MV: 14 L/min Temp (24hrs), Avg:99.5 F (37.5 C), Min:98.1 F (36.7 C), Max:102 F (38.9 C) BP: 95/68 and MAP: 75  Medications reviewed; 20 mg IV Pepcid/day, sliding scale Novolog, 100 mg IV thiamine/day.  Labs reviewed; CBG: 95 mg/dL this AM, Na: 148 mmol/L, Cl: 120 mmol/L, BUN: 54 mg/dL, creatinine: 4.54 mg/edL, Ca: 6.9 mg/dL, GFR: 13 mL/min.   IVF: D5 @ 50 mL/hr (204 kcal) Drips: Precedex @ 1 mcg/kg/hr, Fentanyl @ 75 mcg/hr, Neo @ 30 mcg/min.      12/11 - He is receiving Vital AF 1.2 @ 55 mL/hr. - This regimen is providing 1584 kcal, 99 grams of protein, and 1070 mL free water.  - Will change TF  regimen to Vital 1.5 @ 55 mL/hr.   Patient is currently intubated on ventilator support MV: 8.9 L/min Temp (24hrs), Avg:100 F (37.8 C), Min:97.8 F (36.6 C), Max:102.6 F (39.2 C) BP: 130/99 and MAP: 108   Medication; 2 g IV Mg sulfate x1 dose today IVF: NS @ 100 mL/hr. Drips: Precedex @ 1.2 mcg/kg/hr, Fentanyl @ 100 mcg/hr.     12/10 - Pt started on PePup protocol.  - No family at bedside to provide history.  - Weight noted to be stable.  - A limited NFPE completed.  - Suspect some form of malnutrition but unable to confidently at this time.  Patient is currently intubated on ventilator support MV:9.6L/min Temp (24hrs), Avg:98.2 F (36.8 C), Min:98.2 F (36.8 C), Max:98.2 F (36.8 C) Propofol:None BP: 198/127 MAP: 132     Diet Order:  Diet NPO time specified  EDUCATION NEEDS:   Not appropriate for education at this time  Skin:  Skin Assessment: Reviewed RN Assessment  Last BM:  12/13  Height:   Ht Readings from Last 1 Encounters:  07/17/17 6' (1.829 m)    Weight:   Wt Readings from Last 1 Encounters:  07/20/17 180 lb 1.9 oz (81.7 kg)    Ideal Body Weight:  80.91 kg  BMI:  Body mass index is 24.43 kg/m.  Estimated Nutritional Needs:   Kcal:  2236  Protein:  87-109 grams (1.2-1.5 grams/kg)  Fluid:  >1.5 L/day  Jarome Matin, MS, RD, LDN, [facility] Inpatient Clinical Dietitian Pager # [phone] After hours/weekend pager # [phone]

## 2017-07-20 NOTE — Progress Notes (Signed)
CRITICAL VALUE ALERT  Critical Value:  Lactic acid 2.6  Date & Time Notied:  07/20/17 @ 1309  Provider Notified: Dr. Lake Bells  Orders Received/Actions taken: No new orders.

## 2017-07-20 NOTE — Progress Notes (Signed)
PULMONARY  / CRITICAL CARE MEDICINE  Name: Andrew [name] MRN: [id number] DOB: 1956-04-18    LOS: 73  REFERRING MD :  Dr. Tomi Bamberger  CHIEF COMPLAINT:  Shortness of Breath  BRIEF PATIENT DESCRIPTION:  This is a 61 year old male with a past medical history significant for alcohol abuse who presented to the [facility] long emergency department 12/10 in the setting of alcohol withdrawal.  He stated that he drank a significant amount of vodka per day and his last drink of alcohol was at approximately 11 PM on July 16, 2017, and he started feeling signs and symptoms of withdrawal and by the early morning of July 17, 2017.  He came to the [facility] long emergency department for further care.  There he received some IV Ativan and monitoring but his blood pressure, heart rate, and respiratory status worsened.  The patient was started on BiPAP.  He was given more Ativan.  Pulmonary and critical care medicine was consulted for admission to the intensive care unit.  INTERVAL HISTORY: Sedated this AM, agitated on vent.   LINES / TUBES: 12/10 ET Tube   CULTURES: 12/10 Blood Cx >> NG x 24 hours 12/10 Respiratory Cx >> pending, gram stain polymicrobial  12/10 Urine Strep Antigen >> negative 12/10 Urine Legionella antigen >> pending 07/20/2017 C. difficile is negative.  ANTIBIOTICS: Unasyn 12/10 >>   SIGNIFICANT EVENTS:  07/20/2017 noted to have diarrhea bloody stools with a negative C. difficile  LEVEL OF CARE:  ICU PRIMARY SERVICE:  PCCM CONSULTANTS:   CODE STATUS DIET:  Gastric tube feeds DVT Px:  Lovenox GI Px:  IV Pepcid q12    VITAL SIGNS: Temp:  [98.1 F (36.7 C)-102 F (38.9 C)] 98.2 F (36.8 C) (12/13 0800) Pulse Rate:  [27-86] 77 (12/13 0732) Resp:  [14-19] 16 (12/13 0732) BP: (71-123)/(19-84) 77/56 (12/13 0732) SpO2:  [92 %-98 %] 93 % (12/13 0442) FiO2 (%):  [30 %-40 %] 30 % (12/13 0442) Weight:  [81.7 kg (180 lb 1.9 oz)] 81.7 kg (180 lb 1.9 oz) (12/13 0330) HEMODYNAMICS:    VENTILATOR SETTINGS: Vent Mode: PRVC FiO2 (%):  [30 %-40 %] 30 % Set Rate:  [14 bmp] 14 bmp Vt Set:  [620 mL] 620 mL PEEP:  [5 cmH20] 5 cmH20 Plateau Pressure:  [17 cmH20-21 cmH20] 17 cmH20 INTAKE / OUTPUT: Intake/Output      12/12 0701 - 12/13 0700 12/13 0701 - 12/14 0700   I.V. (mL/kg) 4442.2 (54.4)    NG/GT 1422.5    IV Piggyback 100    Total Intake(mL/kg) 5964.7 (73)    Urine (mL/kg/hr) 225 (0.1)    Emesis/NG output 40    Total Output 265    Net +5699.7           PHYSICAL EXAMINATION: General: Disheveled 61 year old male currently sedated on mechanical ventilatory support.  Agitation continues to be a major concern. HEENT: MM pink/moist PSY: Agitated or sedated  neuro: When awake is agitated does not follow commands moves all extremities x4 CV: Sounds are regular, sinus rhythm with a rate of 87, on Neo-Synephrine at 30 systolic blood pressure is 100 PULM: Decreased breath sounds in the bases, mild rhonchi. GI: Soft positive bowel sounds, tube feeding at goal, no diarrhea and some bloody stools. Extremities: warm/dry, 1+ edema  Skin: no rashes or lesions    LABS: Cbc Recent Labs  Lab 07/18/17 0350 07/19/17 0554 07/19/17 1018 07/20/17 0305  WBC 12.5* 15.4*  --  16.0*  HGB 13.7 12.4*  --  11.5*  HCT 42.5 40.9  --  38.4*  PLT 134* 58* 42* 33*    Chemistry  Recent Labs  Lab 07/17/17 1838 07/18/17 0350 07/18/17 1650 07/19/17 0554 07/20/17 0305  NA  --  145  --  147* 148*  K  --  4.2  --  4.0 3.8  CL  --  115*  --  119* 120*  CO2  --  19*  --  19* 20*  BUN  --  15  --  38* 54*  CREATININE  --  1.37*  --  2.91* 4.54*  CALCIUM  --  7.1*  --  7.0* 6.9*  MG 1.4* 2.0 1.7 1.8  --   PHOS 4.3 4.2 4.2  --   --   GLUCOSE  --  128*  --  123* 121*    Liver fxn Recent Labs  Lab 07/17/17 0420  AST 50*  ALT 45  ALKPHOS 91  BILITOT 0.5  PROT 6.1*  ALBUMIN 3.3*   coags Recent Labs  Lab 07/19/17 1018  APTT 50*  INR 2.64   Sepsis markers Recent Labs   Lab 07/18/17 0700 07/18/17 1011 07/19/17 1130  LATICACIDVEN 2.6* 2.6* 2.4*   Cardiac markers No results for input(s): CKTOTAL, CKMB, TROPONINI in the last 168 hours. BNP No results for input(s): PROBNP in the last 168 hours. ABG Recent Labs  Lab 07/17/17 1417  PHART 7.380  PCO2ART 44.4  PO2ART 79.1*  HCO3 25.7    CBG trend Recent Labs  Lab 07/19/17 1155 07/19/17 1552 07/19/17 1938 07/19/17 2323 07/20/17 0304  GLUCAP 142* 118* 108* 108* 95    IMAGING: 07/19/11 CXR: IMPRESSION: 1. Endotracheal tube seen ending 8 cm above the carina. This be advanced 4 cm, as deemed clinically appropriate. 2. Enteric tube noted extending below the diaphragm. 3. Bibasilar airspace opacities may reflect atelectasis or pneumonia. Small bilateral pleural effusions noted.  DISCUSSION: 61 year old male with a past medical history significant for alcohol abuse presented to the [facility] long emergency department with complaints of a feeling that he was going into alcohol withdrawal.  Developed acute respiratory failure with hypoxemia after admission felt to be secondary to a right lower lobe aspiration pneumonia. Unfortunately patient is persistently febrile now with oliguric renal failure and new thrombocytopenia.   ASSESSMENT / PLAN:  PULMONARY A: Acute respiratory failure with hypoxemia Wheezing Aspiration pneumonia P:   ICU Intubated 12/10 Mechanical ventilatory support Daily wakeup assessment and spontaneous breathing trial.  Note agitation is to feeding wakeup assessments at this time. Currently being treated for aspiration pneumonia  CARDIOVASCULAR A:  Hypotensive Tachycardia due to alcohol withdrawal has improved P:  Telemetry monitoring Neo-Synephrine drip Check cortisol level Check pro-calcitonin  RENAL Lab Results  Component Value Date   CREATININE 4.54 (H) 07/20/2017   CREATININE 2.91 (H) 07/19/2017   CREATININE 1.37 (H) 07/18/2017   Recent Labs  Lab  07/18/17 0350 07/19/17 0554 07/20/17 0305  K 4.2 4.0 3.8   Recent Labs  Lab 07/18/17 0350 07/19/17 0554 07/20/17 0305  NA 145 147* 148*    A:   Acute Renal Failure w/ oliguria, FENa consistent with pre-renal etiology   07/20/2017 worsening renal failure. P:   Monitor BMET and UOP  Replace electrolytes as needed Monitor fluid status, Strict I&Os 07/20/2017 we will check renal ultrasound to exclude a via abdominal ultrasound rule out mechanical obstruction.  He has  had a renal ultrasound in the past that showed some obstructive physiology Change IV fluids to D5W  at 63.  With this diarrhea and melena free water via NG tube probably would not be effective.  GASTROINTESTINAL A:   High risk for re-feeding syndrome P:   Continue tube feeding pepcid for stress ulcer prophylaxis Monitor K, Mg, Phos closely  within normal limits 07/20/2017 Had melena stools 07/20/2017. Noted to have diarrhea with bloody stools.  C. difficile was negative We will add Imodium to pharmaceutical interventions  HEMATOLOGIC Recent Labs    07/19/17 0554 07/20/17 0305  HGB 12.4* 11.5*   Platelets have gone from 42 on 07/19/2017 to 33 07/20/2017 D-dimer 15.92 07/19/2017 Fibrinogen  322 INR 2.64 PT is 28  A:   New thrombocytopenia, reported melanotic stools   P:  Hold lovenox for VTE ppx  SCDs Intermediate 4T score, will send HIT ab F/u DIC panel  Monitor for bleeding  Follow platelet count   INFECTIOUS A:   Aspiration pneumonia presumed, 07/20/2017 T-max 100. CBC went from 15.6-16 on 07/20/2017 P:   Continue Unasyn started 12/10 12/10 Blood Cx >> NG x 48 hours 12/10 Resp Cx >> gram stain polymicrobial, consistent with normal flora  ENDOCRINE CBG (last 3)  Recent Labs    07/19/17 1938 07/19/17 2323 07/20/17 0304  GLUCAP 108* 108* 95    A:   DM2      P:   SSI q4h Monitor CBG  NEUROLOGIC A:   Acute encephalopathy Alcohol withdrawal 07/20/2017 he is either sedated  or agitated. P:   RASS goal: -2 Precedex ggt  Fentanyl prn Versed prn Thiamine daily  App CCT 30 min  Richardson Landry Biannca Scantlin ACNP Maryanna Shape PCCM Pager [phone] till 1 pm If no answer page [phone] 07/20/2017, 8:34 AM

## 2017-07-20 NOTE — Progress Notes (Signed)
CRITICAL VALUE ALERT  Critical Value:  Lactic Acid 3.7  Date & Time Notied:  07/20/17 @ 0909  Provider Notified: Dr. Lake Bells  Orders Received/Actions taken: Repeat lactic acid ordered for noon.

## 2017-07-21 ENCOUNTER — Inpatient Hospital Stay (HOSPITAL_COMMUNITY): Payer: Medicare Other

## 2017-07-21 DIAGNOSIS — F10239 Alcohol dependence with withdrawal, unspecified: Secondary | ICD-10-CM

## 2017-07-21 LAB — GLUCOSE, CAPILLARY
GLUCOSE-CAPILLARY: 122 mg/dL — AB (ref 65–99)
GLUCOSE-CAPILLARY: 124 mg/dL — AB (ref 65–99)
Glucose-Capillary: 118 mg/dL — ABNORMAL HIGH (ref 65–99)
Glucose-Capillary: 133 mg/dL — ABNORMAL HIGH (ref 65–99)
Glucose-Capillary: 134 mg/dL — ABNORMAL HIGH (ref 65–99)
Glucose-Capillary: 141 mg/dL — ABNORMAL HIGH (ref 65–99)
Glucose-Capillary: 96 mg/dL (ref 65–99)

## 2017-07-21 LAB — CBC WITH DIFFERENTIAL/PLATELET
BASOS PCT: 0 %
Basophils Absolute: 0 10*3/uL (ref 0.0–0.1)
EOS PCT: 0 %
Eosinophils Absolute: 0 10*3/uL (ref 0.0–0.7)
HCT: 38.4 % — ABNORMAL LOW (ref 39.0–52.0)
Hemoglobin: 11.6 g/dL — ABNORMAL LOW (ref 13.0–17.0)
LYMPHS ABS: 1.6 10*3/uL (ref 0.7–4.0)
Lymphocytes Relative: 14 %
MCH: 28.8 pg (ref 26.0–34.0)
MCHC: 30.2 g/dL (ref 30.0–36.0)
MCV: 95.3 fL (ref 78.0–100.0)
MONO ABS: 1.1 10*3/uL — AB (ref 0.1–1.0)
Monocytes Relative: 10 %
NEUTROS ABS: 8.5 10*3/uL — AB (ref 1.7–7.7)
NEUTROS PCT: 76 %
PLATELETS: 35 10*3/uL — AB (ref 150–400)
RBC: 4.03 MIL/uL — ABNORMAL LOW (ref 4.22–5.81)
RDW: 17.5 % — ABNORMAL HIGH (ref 11.5–15.5)
WBC: 11.2 10*3/uL — ABNORMAL HIGH (ref 4.0–10.5)

## 2017-07-21 LAB — BASIC METABOLIC PANEL
Anion gap: 12 (ref 5–15)
BUN: 62 mg/dL — AB (ref 6–20)
CO2: 14 mmol/L — ABNORMAL LOW (ref 22–32)
Calcium: 7 mg/dL — ABNORMAL LOW (ref 8.9–10.3)
Chloride: 120 mmol/L — ABNORMAL HIGH (ref 101–111)
Creatinine, Ser: 5.83 mg/dL — ABNORMAL HIGH (ref 0.61–1.24)
GFR calc Af Amer: 11 mL/min — ABNORMAL LOW (ref >60)
GFR, EST NON AFRICAN AMERICAN: 10 mL/min — AB (ref >60)
GLUCOSE: 138 mg/dL — AB (ref 65–99)
POTASSIUM: 4.9 mmol/L (ref 3.5–5.1)
Sodium: 146 mmol/L — ABNORMAL HIGH (ref 135–145)

## 2017-07-21 LAB — PHOSPHORUS: Phosphorus: 6 mg/dL — ABNORMAL HIGH (ref 2.5–4.6)

## 2017-07-21 LAB — MAGNESIUM: Magnesium: 1.9 mg/dL (ref 1.7–2.4)

## 2017-07-21 LAB — PROCALCITONIN: Procalcitonin: 51.6 ng/mL

## 2017-07-21 MED ORDER — DEXTROSE 5 % IV SOLN: 1.0000 g | INTRAVENOUS | Status: DC

## 2017-07-21 MED ORDER — PRISMASOL BGK 4/2.5 32-4-2.5 MEQ/L IV SOLN
INTRAVENOUS | Status: DC
  Administered 2017-07-21 – 2017-07-26 (×10): via INTRAVENOUS_CENTRAL
  Filled 2017-07-21 (×12): qty 5000

## 2017-07-21 MED ORDER — CHLORHEXIDINE GLUCONATE 0.12 % MT SOLN
OROMUCOSAL | Status: AC
  Administered 2017-07-21: 15 mL via OROMUCOSAL
  Filled 2017-07-21: qty 15

## 2017-07-21 MED ORDER — HEPARIN SODIUM (PORCINE) 1000 UNIT/ML DIALYSIS
1000.0000 [IU] | INTRAMUSCULAR | Status: DC | PRN
  Administered 2017-07-26: 2800 [IU] via INTRAVENOUS_CENTRAL
  Filled 2017-07-21: qty 3
  Filled 2017-07-21: qty 6

## 2017-07-21 MED ORDER — DEXTROSE 5 % IV SOLN
2.0000 g | Freq: Two times a day (BID) | INTRAVENOUS | Status: DC
  Administered 2017-07-21 – 2017-07-26 (×9): 2 g via INTRAVENOUS
  Filled 2017-07-21 (×10): qty 2

## 2017-07-21 MED ORDER — VANCOMYCIN HCL 10 G IV SOLR
2000.0000 mg | Freq: Once | INTRAVENOUS | Status: AC
  Administered 2017-07-21: 2000 mg via INTRAVENOUS
  Filled 2017-07-21: qty 2000

## 2017-07-21 MED ORDER — DEXTROSE 5 % IV SOLN
2.0000 g | Freq: Once | INTRAVENOUS | Status: AC
  Administered 2017-07-21: 2 g via INTRAVENOUS
  Filled 2017-07-21: qty 2

## 2017-07-21 MED ORDER — PRISMASOL BGK 4/2.5 32-4-2.5 MEQ/L IV SOLN
INTRAVENOUS | Status: DC
  Administered 2017-07-21 – 2017-07-26 (×37): via INTRAVENOUS_CENTRAL
  Filled 2017-07-21 (×58): qty 5000

## 2017-07-21 MED ORDER — ALTEPLASE 2 MG IJ SOLR: 2.0000 mg | Freq: Once | INTRAMUSCULAR | Status: DC | PRN

## 2017-07-21 MED ORDER — SODIUM CHLORIDE 0.9 % FOR CRRT
INTRAVENOUS_CENTRAL | Status: DC | PRN
  Administered 2017-07-21: 18:00:00 via INTRAVENOUS_CENTRAL
  Filled 2017-07-21: qty 1000

## 2017-07-21 MED ORDER — PRISMASOL BGK 4/2.5 32-4-2.5 MEQ/L IV SOLN
INTRAVENOUS | Status: DC
  Administered 2017-07-21 – 2017-07-26 (×7): via INTRAVENOUS_CENTRAL
  Filled 2017-07-21 (×6): qty 5000

## 2017-07-21 NOTE — Progress Notes (Addendum)
Date:  July 21, 2017 Chart reviewed for concurrent status and case management needs.  Will continue to follow patient progress. Remains on ciwa protocol, iv pressors, full vent support. Discharge Planning: following for needs  Expected discharge date: July 24, 2017  Velva Harman, BSN, [place], [place]

## 2017-07-21 NOTE — Progress Notes (Signed)
PULMONARY  / CRITICAL CARE MEDICINE  Name: CABLE FEARN MRN: [id number] DOB: 1956-04-26    LOS: 3  REFERRING MD :  Dr. Tomi Bamberger  CHIEF COMPLAINT:  Shortness of Breath  BRIEF PATIENT DESCRIPTION:  This is a 61 year old male with a past medical history significant for alcohol abuse who presented to the [facility] long emergency department 12/10 in the setting of alcohol withdrawal.  He stated that he drank a significant amount of vodka per day and his last drink of alcohol was at approximately 11 PM on July 16, 2017, and he started feeling signs and symptoms of withdrawal and by the early morning of July 17, 2017.  He came to the [facility] long emergency department for further care.  There he received some IV Ativan and monitoring but his blood pressure, heart rate, and respiratory status worsened.  The patient was started on BiPAP.  He was given more Ativan.  Pulmonary and critical care medicine was consulted for admission to the intensive care unit.  INTERVAL HISTORY: Sedated this AM, agitated on vent.   LINES / TUBES: 12/10 ET Tube   CULTURES: 12/10 Blood Cx >> NG x 24 hours 12/10 Respiratory Cx >> pending, gram stain polymicrobial  12/10 Urine Strep Antigen >> negative 12/10 Urine Legionella antigen >> pending 07/20/2017 C. difficile is negative. 12/14 sputum >>>  ANTIBIOTICS: Unasyn 12/10 >> 12/14 maxipime 12/14>>> vanc 12/14>>>   SIGNIFICANT EVENTS:  07/20/2017 noted to have diarrhea bloody stools with a negative C. difficile  Subjective Agitated on vent  VITAL SIGNS: Temp:  [97.7 F (36.5 C)-102.3 F (39.1 C)] 99.6 F (37.6 C) (12/14 0800) Pulse Rate:  [32-148] 75 (12/14 0715) Resp:  [14-27] 15 (12/14 0800) BP: (72-128)/(55-87) 92/60 (12/14 0800) SpO2:  [67 %-100 %] 100 % (12/14 0842) FiO2 (%):  [30 %] 30 % (12/14 0842) HEMODYNAMICS:   VENTILATOR SETTINGS: Vent Mode: PRVC FiO2 (%):  [30 %] 30 % Set Rate:  [14 bmp] 14 bmp Vt Set:  [620 mL] 620 mL PEEP:  [5  cmH20] 5 cmH20 Pressure Support:  [8 cmH20] 8 cmH20 Plateau Pressure:  [16 cmH20-20 cmH20] 16 cmH20 INTAKE / OUTPUT:  Intake/Output Summary (Last 24 hours) at 07/21/2017 1003 Last data filed at 07/21/2017 0800 Gross per 24 hour  Intake 4099.28 ml  Output 265 ml  Net 3834.28 ml     PHYSICAL EXAMINATION: General: Chronically ill-appearing white male currently agitated on the ventilator HEENT: Cephalic atraumatic.  He has marked jugular venous distention on exam this morning.  His mucous membranes are moist.  Related, poor dentition. Pulmonary: Scattered rhonchi, no accessory muscle use, excellent tidal volume on pressure support of 8 however very agitated still Cardiac: Regular rate and rhythm without murmur rub or gallop soft nontender no organomegaly Extremity/musculoskeletal left BKA, extremities are warm, dry, brisk cap refill. GU: Minimal urinary output concentrated urine. Neuro agitated, moves all extremities,   LABS: Cbc Recent Labs  Lab 07/19/17 0554 07/19/17 1018 07/20/17 0305 07/21/17 0313  WBC 15.4*  --  16.0* 11.2*  HGB 12.4*  --  11.5* 11.6*  HCT 40.9  --  38.4* 38.4*  PLT 58* 42* 33* 35*    Chemistry  Recent Labs  Lab 07/18/17 0350 07/18/17 1650 07/19/17 0554 07/20/17 0305 07/21/17 0313  NA 145  --  147* 148* 146*  K 4.2  --  4.0 3.8 4.9  CL 115*  --  119* 120* 120*  CO2 19*  --  19* 20* 14*  BUN 15  --  38* 54* 62*  CREATININE 1.37*  --  2.91* 4.54* 5.83*  CALCIUM 7.1*  --  7.0* 6.9* 7.0*  MG 2.0 1.7 1.8  --  1.9  PHOS 4.2 4.2  --   --  6.0*  GLUCOSE 128*  --  123* 121* 138*    Liver fxn Recent Labs  Lab 07/17/17 0420  AST 50*  ALT 45  ALKPHOS 91  BILITOT 0.5  PROT 6.1*  ALBUMIN 3.3*   coags Recent Labs  Lab 07/19/17 1018  APTT 50*  INR 2.64   Sepsis markers Recent Labs  Lab 07/19/17 1130 07/20/17 0826 07/20/17 0940 07/20/17 1203 07/21/17 0313  LATICACIDVEN 2.4* 3.7*  --  2.6*  --   PROCALCITON  --   --  38.49  --   51.60   Cardiac markers No results for input(s): CKTOTAL, CKMB, TROPONINI in the last 168 hours. BNP No results for input(s): PROBNP in the last 168 hours. ABG Recent Labs  Lab 07/17/17 1417  PHART 7.380  PCO2ART 44.4  PO2ART 79.1*  HCO3 25.7    CBG trend Recent Labs  Lab 07/20/17 1529 07/20/17 1918 07/21/17 0006 07/21/17 0339 07/21/17 0735  GLUCAP 109* 87 96 141* 133*    IMAGING: 07/19/11 CXR: IMPRESSION: 1. Endotracheal tube seen ending 8 cm above the carina. This be advanced 4 cm, as deemed clinically appropriate. 2. Enteric tube noted extending below the diaphragm. 3. Bibasilar airspace opacities may reflect atelectasis or pneumonia. Small bilateral pleural effusions noted.  DISCUSSION: 61 year old male with a past medical history significant for alcohol abuse presented to the [facility] long emergency department with complaints of a feeling that he was going into alcohol withdrawal.  Developed acute respiratory failure with hypoxemia after admission felt to be secondary to a right lower lobe aspiration pneumonia. Unfortunately patient is persistently febrile now with oliguric renal failure and new thrombocytopenia.  -PCT rising so will need to widen abx.  -Now looks like he needs CRRT -will ask renal to see  ASSESSMENT / PLAN:  PULMONARY A: Acute respiratory failure with hypoxemia Aspiration pneumonia versus hcap complicated by volume overload/pulmonary edema Chest x-ray personally reviewed support lines and tubes are in satisfactory position.  Has bilateral airspace disease consistent with effusion/pulmonary edema/aspiration P:   For continuing pressure support ventilation as tolerated Volume removal via CRRT if nephrology agrees PAD protocol RASS goal  0--1 Repeat chest x-ray in a.m. See infectious disease section  CARDIOVASCULAR A:  Hypotensive suspect some of this is medication related  tachycardia due to alcohol withdrawal has improved P:  Continue  Neo-Synephrine map goal greater than 65 Continue telemetry monitoring  RENAL A:   Acute Renal Failure w/ oliguria, FENa consistent with pre-renal etiology   07/20/2017 worsening renal failure. Worsening metabolic acidosis Hypernatremia Hyperchloremia Borderline hyperkalemia P: We will ask nephrology to see, he will need CRRT at this point Continue to renal dose medications Follow-up a.m. Labs  GASTROINTESTINAL A:   High risk for re-feeding syndrome Diarrhea, C. difficile negative Report of bloody stool P:   Continuing tube feeds Continue stress ulcer prophylaxis  Continuing Imodium  Hematology A:   Anemia New thrombocytopenia, reported melanotic stools DIC panel negative for schistocytes HIT panel negative Report of malonic stools P:  Continue SCDs Trend CBC Asked nursing staff to Hemoccult next stool  INFECTIOUS A:   Aspiration pneumonia presumed Temperature spike up to 102 last night,wbc ct better BUT PCT algo higher  P:   Add noscocomial coverage   ENDOCRINE A:  DM2      P:   Continuing CBG monitoring as well as sliding scale insulin  NEUROLOGIC A:   Acute encephalopathy Alcohol withdrawal 07/20/2017 he is either sedated or agitated. P:   RASS: -2 Continuing Precedex infusion with as needed fentanyl Continue daily thiamine Continue supportive care  My cct 40 minutes  Erick Colace ACNP-BC [place] Pager # [phone] OR # [phone] if no answer
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
## 2017-07-21 NOTE — Procedures (Signed)
Central Venous dialysis Catheter Insertion Procedure Note Andrew [name] [id number] 11-27-1955  Procedure: Insertion of Central Venous Catheter Indications: CRRt  Procedure Details Consent: Risks of procedure as well as the alternatives and risks of each were explained to the (patient/caregiver).  Consent for procedure obtained. Time Out: Verified patient identification, verified procedure, site/side was marked, verified correct patient position, special equipment/implants available, medications/allergies/relevent history reviewed, required imaging and test results available.  Performed Real time [place] was used to ID and cannulate the vessel  Maximum sterile technique was used including antiseptics, cap, gloves, gown, hand hygiene, mask and sheet. Skin prep: Chlorhexidine; local anesthetic administered A antimicrobial bonded/coated triple lumen catheter was placed in the left internal jugular vein using the Seldinger technique.  Evaluation Blood flow good Complications: No apparent complications Patient did tolerate procedure well. Chest X-ray ordered to verify placement.  CXR: pending.  Andrew [name] 07/21/2017, 3:18 PM Erick Colace ACNP-BC [place] Pager # [phone] OR # [phone] if no answer

## 2017-07-21 NOTE — Progress Notes (Signed)
Pharmacy Antibiotic Note  Andrew [name] is a 61 y.o. male admitted on 07/17/2017 with pneumonia.  Pharmacy has been consulted for ampicillin/sulbactam dosing. Patient presents with alcohol withdrawal and concerns for aspiration pneumonia. Ordered vancomycin and cefepime x 1 in ED.  Orders for pharmacy to dose vancomycin and cefepime starting 12/14 as PCT rising, fevers 12/13 PM.    Today, 07/21/2017  Renal: SCr trending up w/ dec UOP  WBC improved  PCT trending up  Cultures unrevealing  Plan:  Vancomycin 2gm IV x 1 then will dose per levels for worsening renal function  Plan random vancomycin level Sun am  Cefepime 2gm x 1 then 1gm IV q24h  Daily CMP ordered per PCCM  Height: 6' (182.9 cm) Weight: 180 lb 1.9 oz (81.7 kg) IBW/kg (Calculated) : 77.6  Temp (24hrs), Avg:99.8 F (37.7 C), Min:97.7 F (36.5 C), Max:102.3 F (39.1 C)  Recent Labs  Lab 07/17/17 0420 07/17/17 1342  07/18/17 0350 07/18/17 0700 07/18/17 1011 07/19/17 0554 07/19/17 1130 07/20/17 0305 07/20/17 0826 07/20/17 1203 07/21/17 0313  WBC 6.9  --   --  12.5*  --   --  15.4*  --  16.0*  --   --  11.2*  CREATININE 0.73 0.89  --  1.37*  --   --  2.91*  --  4.54*  --   --  5.83*  LATICACIDVEN  --  2.7*   < > 3.2* 2.6* 2.6*  --  2.4*  --  3.7* 2.6*  --    < > = values in this interval not displayed.    Estimated Creatinine Clearance: 15 mL/min (A) (by C-G formula based on SCr of 5.83 mg/dL (H)).    No Known Allergies  Antimicrobials this admission:  12/10 unasyn >> 12/14 12/14 vanco >> 12/14 cefepime >>  Dose adjustments this admission:  12/12 adjust unasyn from q6h to q12h for CrCl<30  Microbiology results:  12/10 Strep Ag: neg 12/10 BCx: ngtd 12/10 TA: normal flora 12/12 MRSA screen neg 12/13 Cdiff neg  Thank you for allowing pharmacy to be a part of this patient's care.  Doreene Eland, PharmD, BCPS.   Pager: [phone] 07/21/2017 10:51 AM

## 2017-07-21 NOTE — Progress Notes (Signed)
Pharmacy Antibiotic Note  Andrew [name] is a 61 y.o. male admitted on 07/17/2017 with pneumonia.  Pharmacy has been consulted for ampicillin/sulbactam dosing. Patient presents with alcohol withdrawal and concerns for aspiration pneumonia. Ordered vancomycin and cefepime x 1 in ED.  Orders for pharmacy to dose vancomycin and cefepime starting 12/14 as PCT rising, fevers 12/13 PM.    Update, 07/21/2017  CRRT to be initiated  WBC improved  PCT trending up  Cultures unrevealing  Plan:  Check random level in AM to determine when to begin maintenance dose  Adjust Cefepime to 2gm IV q12h  Daily CMP ordered per PCCM  Height: 6' (182.9 cm) Weight: 180 lb 1.9 oz (81.7 kg) IBW/kg (Calculated) : 77.6  Temp (24hrs), Avg:99.8 F (37.7 C), Min:97.7 F (36.5 C), Max:102.3 F (39.1 C)  Recent Labs  Lab 07/17/17 0420 07/17/17 1342  07/18/17 0350 07/18/17 0700 07/18/17 1011 07/19/17 0554 07/19/17 1130 07/20/17 0305 07/20/17 0826 07/20/17 1203 07/21/17 0313  WBC 6.9  --   --  12.5*  --   --  15.4*  --  16.0*  --   --  11.2*  CREATININE 0.73 0.89  --  1.37*  --   --  2.91*  --  4.54*  --   --  5.83*  LATICACIDVEN  --  2.7*   < > 3.2* 2.6* 2.6*  --  2.4*  --  3.7* 2.6*  --    < > = values in this interval not displayed.    Estimated Creatinine Clearance: 15 mL/min (A) (by C-G formula based on SCr of 5.83 mg/dL (H)).    No Known Allergies  Antimicrobials this admission:  12/10 unasyn >> 12/14 12/14 vanco >> 12/14 cefepime >>  Dose adjustments this admission:  12/12 adjust unasyn from q6h to q12h for CrCl<30  Microbiology results:  12/10 Strep Ag: neg 12/10 BCx: ngtd 12/10 TA: normal flora 12/12 MRSA screen neg 12/13 Cdiff neg  Thank you for allowing pharmacy to be a part of this patient's care.  Peggyann Juba, PharmD, BCPS Pager: [phone] 07/21/2017 4:44 PM

## 2017-07-22 ENCOUNTER — Inpatient Hospital Stay (HOSPITAL_COMMUNITY): Payer: Medicare Other

## 2017-07-22 LAB — RENAL FUNCTION PANEL
ANION GAP: 8 (ref 5–15)
Albumin: 2.2 g/dL — ABNORMAL LOW (ref 3.5–5.0)
BUN: 35 mg/dL — ABNORMAL HIGH (ref 6–20)
CO2: 23 mmol/L (ref 22–32)
Calcium: 7.3 mg/dL — ABNORMAL LOW (ref 8.9–10.3)
Chloride: 110 mmol/L (ref 101–111)
Creatinine, Ser: 3.13 mg/dL — ABNORMAL HIGH (ref 0.61–1.24)
GFR calc Af Amer: 23 mL/min — ABNORMAL LOW (ref >60)
GFR calc non Af Amer: 20 mL/min — ABNORMAL LOW (ref >60)
GLUCOSE: 147 mg/dL — AB (ref 65–99)
PHOSPHORUS: 2.7 mg/dL (ref 2.5–4.6)
POTASSIUM: 3.9 mmol/L (ref 3.5–5.1)
Sodium: 141 mmol/L (ref 135–145)

## 2017-07-22 LAB — CULTURE, BLOOD (ROUTINE X 2)
CULTURE: NO GROWTH
Culture: NO GROWTH
SPECIAL REQUESTS: ADEQUATE
SPECIAL REQUESTS: ADEQUATE

## 2017-07-22 LAB — CBC
HCT: 33.4 % — ABNORMAL LOW (ref 39.0–52.0)
Hemoglobin: 10.3 g/dL — ABNORMAL LOW (ref 13.0–17.0)
MCH: 28.8 pg (ref 26.0–34.0)
MCHC: 30.8 g/dL (ref 30.0–36.0)
MCV: 93.3 fL (ref 78.0–100.0)
PLATELETS: 27 10*3/uL — AB (ref 150–400)
RBC: 3.58 MIL/uL — ABNORMAL LOW (ref 4.22–5.81)
RDW: 17.3 % — AB (ref 11.5–15.5)
WBC: 5.5 10*3/uL (ref 4.0–10.5)

## 2017-07-22 LAB — APTT: aPTT: 38 seconds — ABNORMAL HIGH (ref 24–36)

## 2017-07-22 LAB — GLUCOSE, CAPILLARY
GLUCOSE-CAPILLARY: 138 mg/dL — AB (ref 65–99)
GLUCOSE-CAPILLARY: 124 mg/dL — AB (ref 65–99)
GLUCOSE-CAPILLARY: 128 mg/dL — AB (ref 65–99)
Glucose-Capillary: 133 mg/dL — ABNORMAL HIGH (ref 65–99)
Glucose-Capillary: 140 mg/dL — ABNORMAL HIGH (ref 65–99)
Glucose-Capillary: 147 mg/dL — ABNORMAL HIGH (ref 65–99)

## 2017-07-22 LAB — PROCALCITONIN: Procalcitonin: 32.93 ng/mL

## 2017-07-22 LAB — VANCOMYCIN, RANDOM: Vancomycin Rm: 16

## 2017-07-22 LAB — MAGNESIUM: MAGNESIUM: 1.9 mg/dL (ref 1.7–2.4)

## 2017-07-22 MED ORDER — NOREPINEPHRINE BITARTRATE 1 MG/ML IV SOLN
0.0000 ug/min | INTRAVENOUS | Status: DC
  Administered 2017-07-22: 4 ug/min via INTRAVENOUS
  Administered 2017-07-23: 2 ug/min via INTRAVENOUS
  Administered 2017-07-25: 1 ug/min via INTRAVENOUS
  Filled 2017-07-22 (×3): qty 4

## 2017-07-22 MED ORDER — CHLORHEXIDINE GLUCONATE CLOTH 2 % EX PADS
6.0000 | MEDICATED_PAD | Freq: Every day | CUTANEOUS | Status: DC
  Administered 2017-07-22 – 2017-07-31 (×10): 6 via TOPICAL

## 2017-07-22 MED ORDER — VANCOMYCIN HCL IN DEXTROSE 1-5 GM/200ML-% IV SOLN
1000.0000 mg | INTRAVENOUS | Status: DC
  Administered 2017-07-22: 1000 mg via INTRAVENOUS
  Filled 2017-07-22: qty 200

## 2017-07-22 MED ORDER — SODIUM CHLORIDE 0.9% FLUSH
10.0000 mL | INTRAVENOUS | Status: DC | PRN
  Administered 2017-08-01: 10 mL
  Filled 2017-07-22: qty 40

## 2017-07-22 MED ORDER — SODIUM CHLORIDE 0.9% FLUSH
10.0000 mL | Freq: Two times a day (BID) | INTRAVENOUS | Status: DC
  Administered 2017-07-23 – 2017-07-25 (×4): 10 mL
  Administered 2017-07-26: 30 mL
  Administered 2017-07-28 – 2017-08-01 (×7): 10 mL

## 2017-07-22 NOTE — Progress Notes (Signed)
Pt heart rate SB as low as 38 nonsustained, precedex gtt off.  CCM MD made aware, no new orders will continue to monitor and report.

## 2017-07-22 NOTE — Progress Notes (Signed)
[facility] Progress Note  Subjective: no sig UOP, off the neo gtt now, on levo gtt at 6 ug/min  Vitals:   07/22/17 1500 07/22/17 1600 07/22/17 1700 07/22/17 1800  BP: 101/74 103/68 (!) 107/56 (!) 100/57  Pulse: (!) 59 (!) 120 70 66  Resp: _0 Temp:      TempSrc:      SpO2: 100% 100% 100% 100%  Weight:      Height:        Inpatient medications: . chlorhexidine gluconate (MEDLINE KIT)  15 mL Mouth Rinse BID  . Chlorhexidine Gluconate Cloth  6 each Topical Daily  . insulin aspart  0-15 Units Subcutaneous Q4H  . mouth rinse  15 mL Mouth Rinse QID  . sodium chloride flush  10-40 mL Intracatheter Q12H  . thiamine  100 mg Oral Daily   Or  . thiamine  100 mg Intravenous Daily   . ceFEPime (MAXIPIME) IV Stopped (07/22/17 1345)  . dexmedetomidine 0.3 mcg/kg/hr (07/22/17 0700)  . dextrose 50 mL/hr at 07/22/17 0700  . famotidine (PEPCID) IV Stopped (07/21/17 2137)  . feeding supplement (VITAL 1.5 CAL) 1,000 mL (07/22/17 1730)  . fentaNYL infusion INTRAVENOUS 150 mcg/hr (07/22/17 1820)  . norepinephrine (LEVOPHED) Adult infusion 6 mcg/min (07/22/17 1841)  . phenylephrine (NEO-SYNEPHRINE) Adult infusion Stopped (07/22/17 1459)  . dialysis replacement fluid (prismasate) 400 mL/hr at 07/22/17 0656  . dialysis replacement fluid (prismasate) 200 mL/hr at 07/21/17 1815  . dialysate (PRISMASATE) 2,000 mL/hr at 07/22/17 1730  . sodium chloride 999 mL/hr at 07/21/17 1740  . vancomycin Stopped (07/22/17 1514)   acetaminophen (TYLENOL) oral liquid 160 mg/5 mL, albuterol, alteplase, fentaNYL (SUBLIMAZE) injection, fentaNYL (SUBLIMAZE) injection, heparin, loperamide, midazolam, sodium chloride, sodium chloride flush  Exam: Gen on the vent sedated Sclera anicteric, throat w ETT  No jvd or bruits Chest clear bilat RRR no MRG Abd soft ntnd no mass or ascites +bs GU normal male w penile/ scrotal edema Ext diffuse 2-3+ edema Neuro is sedated, on the vent  07/17/17 UA  > negative CXR 12/15 - no chg bibasilar effusions, bilat basilar infiltrates  Impression: 1. Acute kidney injury - oliguric, minimal UOP. Cont CRRT D#2.  2. Vol overload - try to pull some fluid at 50 cc/hr  3. Pulm infiltrates - on CXR, up 14 L from admit 4. ETOH abuse 5. Asp PNA  6. VDRF     Plan - as above   Kelly Splinter MD [facility] pager [phone]   07/22/2017, 6:54 PM   Recent Labs  Lab 07/18/17 1650  07/20/17 0305 07/21/17 0313 07/22/17 1541  NA  --    < > 148* 146* 141  K  --    < > 3.8 4.9 3.9  CL  --    < > 120* 120* 110  CO2  --    < > 20* 14* 23  GLUCOSE  --    < > 121* 138* 147*  BUN  --    < > 54* 62* 35*  CREATININE  --    < > 4.54* 5.83* 3.13*  CALCIUM  --    < > 6.9* 7.0* 7.3*  PHOS 4.2  --   --  6.0* 2.7   < > = values in this interval not displayed.   Recent Labs  Lab 07/17/17 0420 07/22/17 1541  AST 50*  --   ALT 45  --   ALKPHOS 91  --   BILITOT 0.5  --  PROT 6.1*  --   ALBUMIN 3.3* 2.2*   Recent Labs  Lab 07/17/17 0420  07/19/17 0554  07/20/17 0305 07/21/17 0313 07/22/17 0527  WBC 6.9   < > 15.4*  --  16.0* 11.2* 5.5  NEUTROABS 4.2  --  12.8*  --   --  8.5*  --   HGB 13.5   < > 12.4*  --  11.5* 11.6* 10.3*  HCT 40.9   < > 40.9  --  38.4* 38.4* 33.4*  MCV 88.1   < > 94.9  --  96.7 95.3 93.3  PLT 251   < > 58*   < > 33* 35* 27*   < > = values in this interval not displayed.   Iron/TIBC/Ferritin/ %Sat No results found for: IRON, TIBC, FERRITIN, IRONPCTSAT

## 2017-07-22 NOTE — Progress Notes (Signed)
Pharmacy Antibiotic Note  Andrew [name] is a 61 y.o. male admitted on 07/17/2017 with pneumonia.  Pharmacy has been consulted for ampicillin/sulbactam dosing. Patient presents with alcohol withdrawal and concerns for aspiration pneumonia. Ordered vancomycin and cefepime x 1 in ED.  Orders for pharmacy to dose vancomycin and cefepime starting 12/14 as PCT rising, fevers 12/13 PM.    Update, 07/22/2017  CRRT initiated yday evening - running w/o interuption per RN  WBC improved  PCT improved this am  afebrile  Cultures unrevealing  Random vanco level = 16 mcg/ml this am following 2gm LD 12/15  Plan: Per CRRT dosing  Vancomycin 1gm IV q24h started today  Continue Cefepime to 2gm IV q12h  Daily CMP ordered per PCCM  Height: 6' (182.9 cm) Weight: 197 lb 12 oz (89.7 kg) IBW/kg (Calculated) : 77.6  Temp (24hrs), Avg:97 F (36.1 C), Min:96 F (35.6 C), Max:97.9 F (36.6 C)  Recent Labs  Lab 07/17/17 1342  07/18/17 0350 07/18/17 0700 07/18/17 1011 07/19/17 0554 07/19/17 1130 07/20/17 0305 07/20/17 0826 07/20/17 1203 07/21/17 0313 07/22/17 0500 07/22/17 0527  WBC  --   --  12.5*  --   --  15.4*  --  16.0*  --   --  11.2*  --  5.5  CREATININE 0.89  --  1.37*  --   --  2.91*  --  4.54*  --   --  5.83*  --   --   LATICACIDVEN 2.7*   < > 3.2* 2.6* 2.6*  --  2.4*  --  3.7* 2.6*  --   --   --   VANCORANDOM  --   --   --   --   --   --   --   --   --   --   --  16  --    < > = values in this interval not displayed.    Estimated Creatinine Clearance: 15 mL/min (A) (by C-G formula based on SCr of 5.83 mg/dL (H)).    No Known Allergies  Antimicrobials this admission:  12/10 unasyn >> 12/14 12/14 vanco >> 12/14 cefepime >>  Dose adjustments this admission:  12/12 adjust unasyn from q6h to q12h for CrCl<30 12/14 vanco 2gm x 1 at 12:45P 12/15: 0500 random VL = 16  Microbiology results:  12/10 Strep Ag: neg 12/10 BCx: ngtd 12/10 TA: normal flora 12/12 MRSA screen  neg 12/13 Cdiff neg 12/14 TA: pending  Thank you for allowing pharmacy to be a part of this patient's care.  Doreene Eland, PharmD, BCPS.   Pager: [phone] 07/22/2017 10:19 AM

## 2017-07-22 NOTE — Progress Notes (Signed)
PULMONARY  / CRITICAL CARE MEDICINE  Name: Andrew [name] MRN: [id number] DOB: 04/07/1956    LOS: 53  REFERRING MD :  Dr. Tomi Bamberger  CHIEF COMPLAINT:  Shortness of Breath  BRIEF PATIENT DESCRIPTION:  This is a 61 year old male with a past medical history significant for alcohol abuse who presented to the [facility] long emergency department 12/10 in the setting of alcohol withdrawal.  He stated that he drank a significant amount of vodka per day and his last drink of alcohol was at approximately 11 PM on July 16, 2017, and he started feeling signs and symptoms of withdrawal and by the early morning of July 17, 2017.  He came to the [facility] long emergency department for further care.  There he received some IV Ativan and monitoring but his blood pressure, heart rate, and respiratory status worsened.  The patient was started on BiPAP.  He was given more Ativan.  Pulmonary and critical care medicine was consulted for admission to the intensive care unit.  INTERVAL HISTORY: Sedated this AM, agitated on vent.   LINES / TUBES: 12/10 ET Tube   CULTURES: 12/10 Blood Cx >> NG x 24 hours 12/10 Respiratory Cx >> pending, gram stain polymicrobial  12/10 Urine Strep Antigen >> negative 12/10 Urine Legionella antigen >> pending 07/20/2017 C. difficile is negative. 12/14 sputum >>> no growth  ANTIBIOTICS: Unasyn 12/10 >> 12/14 maxipime 12/14>>> vanc 12/14>>>   SIGNIFICANT EVENTS:  07/20/2017 noted to have diarrhea bloody stools with a negative C. difficile 12/14 started CVVHD  Subjective Started CVVHD  VITAL SIGNS: Temp:  [96 F (35.6 C)-97.7 F (36.5 C)] 97.3 F (36.3 C) (12/15 1200) Pulse Rate:  [37-73] 59 (12/15 1500) Resp:  [12-24] 14 (12/15 1500) BP: (76-131)/(52-99) 101/74 (12/15 1500) SpO2:  [94 %-100 %] 100 % (12/15 1500) FiO2 (%):  [40 %-50 %] 40 % (12/15 1240) Weight:  [89.7 kg (197 lb 12 oz)] 89.7 kg (197 lb 12 oz) (12/15 0500) HEMODYNAMICS:   VENTILATOR SETTINGS: Vent  Mode: PRVC FiO2 (%):  [40 %-50 %] 40 % Set Rate:  [14 bmp] 14 bmp Vt Set:  [485 mL] 620 mL PEEP:  [5 cmH20] 5 cmH20 Plateau Pressure:  [12 IOE70-35 cmH20] 16 cmH20 INTAKE / OUTPUT:  Intake/Output Summary (Last 24 hours) at 07/22/2017 1514 Last data filed at 07/22/2017 1500 Gross per 24 hour  Intake 5043.48 ml  Output 4207 ml  Net 836.48 ml     PHYSICAL EXAMINATION:  General:  In bed on vent HENT: NCAT ETT in place PULM: CTA B, vent supported breathing CV: RRR, no mgr GI: BS+, soft, nontender MSK: normal bulk and tone Neuro: sedated on vent    LABS: Cbc Recent Labs  Lab 07/20/17 0305 07/21/17 0313 07/22/17 0527  WBC 16.0* 11.2* 5.5  HGB 11.5* 11.6* 10.3*  HCT 38.4* 38.4* 33.4*  PLT 33* 35* 27*    Chemistry  Recent Labs  Lab 07/18/17 0350 07/18/17 1650 07/19/17 0554 07/20/17 0305 07/21/17 0313 07/22/17 0527  NA 145  --  147* 148* 146*  --   K 4.2  --  4.0 3.8 4.9  --   CL 115*  --  119* 120* 120*  --   CO2 19*  --  19* 20* 14*  --   BUN 15  --  38* 54* 62*  --   CREATININE 1.37*  --  2.91* 4.54* 5.83*  --   CALCIUM 7.1*  --  7.0* 6.9* 7.0*  --   MG 2.0  1.7 1.8  --  1.9 1.9  PHOS 4.2 4.2  --   --  6.0*  --   GLUCOSE 128*  --  123* 121* 138*  --     Liver fxn Recent Labs  Lab 07/17/17 0420  AST 50*  ALT 45  ALKPHOS 91  BILITOT 0.5  PROT 6.1*  ALBUMIN 3.3*   coags Recent Labs  Lab 07/19/17 1018 07/22/17 0527  APTT 50* 38*  INR 2.64  --    Sepsis markers Recent Labs  Lab 07/19/17 1130 07/20/17 0826 07/20/17 0940 07/20/17 1203 07/21/17 0313 07/22/17 0527  LATICACIDVEN 2.4* 3.7*  --  2.6*  --   --   PROCALCITON  --   --  38.49  --  51.60 32.93   Cardiac markers No results for input(s): CKTOTAL, CKMB, TROPONINI in the last 168 hours. BNP No results for input(s): PROBNP in the last 168 hours. ABG Recent Labs  Lab 07/17/17 1417  PHART 7.380  PCO2ART 44.4  PO2ART 79.1*  HCO3 25.7    CBG trend Recent Labs  Lab  07/21/17 1935 07/21/17 2353 07/22/17 0404 07/22/17 0758 07/22/17 1311  GLUCAP 124* 118* 140* 138* 124*    IMAGING: 07/19/11 CXR: IMPRESSION: 1. Endotracheal tube seen ending 8 cm above the carina. This be advanced 4 cm, as deemed clinically appropriate. 2. Enteric tube noted extending below the diaphragm. 3. Bibasilar airspace opacities may reflect atelectasis or pneumonia. Small bilateral pleural effusions noted.  DISCUSSION: 61 year old male with a past medical history significant for alcohol abuse presented to the [facility] long emergency department with complaints of a feeling that he was going into alcohol withdrawal.  Developed acute respiratory failure with hypoxemia after admission felt to be secondary to a right lower lobe aspiration pneumonia. Unfortunately patient is persistently febrile now with oliguric renal failure and new thrombocytopenia.  -PCT rising so will need to widen abx.  -Now looks like he needs CRRT -will ask renal to see  ASSESSMENT / PLAN:  PULMONARY A: Acute respiratory failure with hypoxemia Aspiration pneumonia versus hcap complicated by volume overload/pulmonary edema P:   Remove volume Full mechanical vent support VAP prevention Daily WUA/SBT   CARDIOVASCULAR A:  Hypotensive suspect some of this is medication related  tachycardia due to alcohol withdrawal has improved P:  Change neo to levophed Check echo Tele  RENAL A:   Acute Renal Failure w/ oliguria, FENa consistent with pre-renal etiology   07/20/2017 worsening renal failure  P: CVVHD Monitor BMET and UOP Replace electrolytes as needed   GASTROINTESTINAL A:   High risk for re-feeding syndrome Diarrhea, C. difficile negative Report of bloody stool P:   Continue tube feedings Continue immodium  Hematology A:   Anemia New thrombocytopenia due to sepsis P:  Monitor for bleeding  Cbc daily  INFECTIOUS A:   Aspiration pneumonia presumed Temperature spike up to  102 last night,wbc ct better BUT PCT algo higher  P:   F/u cultures Continue vanc and cefepime, narrow 12/16  ENDOCRINE A:   DM2      P:   SSI  NEUROLOGIC A:   Acute encephalopathy Alcohol withdrawal 07/20/2017 he is either sedated or agitated. P:   RASS -2 precedex and prn fentanyl Continue thiamine   My cc time 31 minutes  Roselie Awkward, MD [facility] Pager: [phone] Cell: [phone] After 3pm or if no response, call [phone]

## 2017-07-23 ENCOUNTER — Other Ambulatory Visit (HOSPITAL_COMMUNITY): Payer: Self-pay

## 2017-07-23 ENCOUNTER — Inpatient Hospital Stay (HOSPITAL_COMMUNITY): Payer: Medicare Other

## 2017-07-23 DIAGNOSIS — R0600 Dyspnea, unspecified: Secondary | ICD-10-CM

## 2017-07-23 LAB — ECHOCARDIOGRAM COMPLETE
CHL CUP MV DEC (S): 190
CHL CUP TV REG PEAK VELOCITY: 307 cm/s
E/e' ratio: 9.36
EWDT: 190 ms
FS: 21 % — AB (ref 28–44)
Height: 72 in
IV/PV OW: 0.91
LA diam end sys: 40 mm
LA vol: 91.8 mL
LADIAMINDEX: 1.89 cm/m2
LASIZE: 40 mm
LAVOLA4C: 89.9 mL
LAVOLIN: 43.3 mL/m2
LV E/e' medial: 9.36
LV e' LATERAL: 8.03 cm/s
LVEEAVG: 9.36
LVOT SV: 34 mL
LVOT VTI: 9.91 cm
LVOT area: 3.46 cm2
LVOT peak vel: 54.9 cm/s
LVOTD: 21 mm
Lateral S' vel: 8.51 cm/s
MV Peak grad: 2 mmHg
MVPKAVEL: 32.5 m/s
MVPKEVEL: 75.2 m/s
PW: 11.8 mm — AB (ref 0.6–1.1)
RV TAPSE: 19.4 mm
RV sys press: 53 mmHg
TDI e' lateral: 8.03
TDI e' medial: 5.19
TRMAXVEL: 307 cm/s
Weight: 3160.51 oz

## 2017-07-23 LAB — RENAL FUNCTION PANEL
ALBUMIN: 2.1 g/dL — AB (ref 3.5–5.0)
ANION GAP: 7 (ref 5–15)
ANION GAP: 7 (ref 5–15)
Albumin: 1.9 g/dL — ABNORMAL LOW (ref 3.5–5.0)
BUN: 29 mg/dL — AB (ref 6–20)
BUN: 23 mg/dL — ABNORMAL HIGH (ref 6–20)
CALCIUM: 7.4 mg/dL — AB (ref 8.9–10.3)
CHLORIDE: 108 mmol/L (ref 101–111)
CO2: 24 mmol/L (ref 22–32)
CO2: 24 mmol/L (ref 22–32)
Calcium: 7.3 mg/dL — ABNORMAL LOW (ref 8.9–10.3)
Chloride: 107 mmol/L (ref 101–111)
Creatinine, Ser: 2.57 mg/dL — ABNORMAL HIGH (ref 0.61–1.24)
Creatinine, Ser: 2.09 mg/dL — ABNORMAL HIGH (ref 0.61–1.24)
GFR calc Af Amer: 30 mL/min — ABNORMAL LOW (ref >60)
GFR calc non Af Amer: 26 mL/min — ABNORMAL LOW (ref >60)
GFR, EST AFRICAN AMERICAN: 38 mL/min — AB (ref >60)
GFR, EST NON AFRICAN AMERICAN: 33 mL/min — AB (ref >60)
GLUCOSE: 149 mg/dL — AB (ref 65–99)
GLUCOSE: 144 mg/dL — AB (ref 65–99)
PHOSPHORUS: 1.6 mg/dL — AB (ref 2.5–4.6)
POTASSIUM: 3.5 mmol/L (ref 3.5–5.1)
POTASSIUM: 3.8 mmol/L (ref 3.5–5.1)
Phosphorus: 2.1 mg/dL — ABNORMAL LOW (ref 2.5–4.6)
SODIUM: 138 mmol/L (ref 135–145)
Sodium: 139 mmol/L (ref 135–145)

## 2017-07-23 LAB — CBC WITH DIFFERENTIAL/PLATELET
BASOS ABS: 0 10*3/uL (ref 0.0–0.1)
BASOS PCT: 0 %
EOS ABS: 0.1 10*3/uL (ref 0.0–0.7)
Eosinophils Relative: 1 %
HEMATOCRIT: 29.6 % — AB (ref 39.0–52.0)
HEMOGLOBIN: 9.5 g/dL — AB (ref 13.0–17.0)
Lymphocytes Relative: 10 %
Lymphs Abs: 0.6 10*3/uL — ABNORMAL LOW (ref 0.7–4.0)
MCH: 29.1 pg (ref 26.0–34.0)
MCHC: 32.1 g/dL (ref 30.0–36.0)
MCV: 90.5 fL (ref 78.0–100.0)
MONOS PCT: 7 %
Monocytes Absolute: 0.4 10*3/uL (ref 0.1–1.0)
NEUTROS ABS: 4.6 10*3/uL (ref 1.7–7.7)
NEUTROS PCT: 81 %
Platelets: 22 10*3/uL — CL (ref 150–400)
RBC: 3.27 MIL/uL — AB (ref 4.22–5.81)
RDW: 17.1 % — ABNORMAL HIGH (ref 11.5–15.5)
WBC: 5.7 10*3/uL (ref 4.0–10.5)

## 2017-07-23 LAB — GLUCOSE, CAPILLARY
GLUCOSE-CAPILLARY: 136 mg/dL — AB (ref 65–99)
GLUCOSE-CAPILLARY: 126 mg/dL — AB (ref 65–99)
Glucose-Capillary: 142 mg/dL — ABNORMAL HIGH (ref 65–99)
Glucose-Capillary: 129 mg/dL — ABNORMAL HIGH (ref 65–99)
Glucose-Capillary: 113 mg/dL — ABNORMAL HIGH (ref 65–99)
Glucose-Capillary: 168 mg/dL — ABNORMAL HIGH (ref 65–99)

## 2017-07-23 LAB — BASIC METABOLIC PANEL
ANION GAP: 8 (ref 5–15)
BUN: 29 mg/dL — ABNORMAL HIGH (ref 6–20)
CALCIUM: 7.2 mg/dL — AB (ref 8.9–10.3)
CHLORIDE: 108 mmol/L (ref 101–111)
CO2: 24 mmol/L (ref 22–32)
CREATININE: 2.6 mg/dL — AB (ref 0.61–1.24)
GFR calc non Af Amer: 25 mL/min — ABNORMAL LOW (ref >60)
GFR, EST AFRICAN AMERICAN: 29 mL/min — AB (ref >60)
Glucose, Bld: 148 mg/dL — ABNORMAL HIGH (ref 65–99)
Potassium: 3.6 mmol/L (ref 3.5–5.1)
SODIUM: 140 mmol/L (ref 135–145)

## 2017-07-23 LAB — MAGNESIUM: MAGNESIUM: 1.9 mg/dL (ref 1.7–2.4)

## 2017-07-23 LAB — TSH: TSH: 4.173 u[IU]/mL (ref 0.350–4.500)

## 2017-07-23 LAB — CULTURE, RESPIRATORY W GRAM STAIN

## 2017-07-23 LAB — CULTURE, RESPIRATORY: CULTURE: NO GROWTH

## 2017-07-23 LAB — APTT: aPTT: 59 seconds — ABNORMAL HIGH (ref 24–36)

## 2017-07-23 MED ORDER — HYDROCORTISONE NA SUCCINATE PF 100 MG IJ SOLR
50.0000 mg | Freq: Four times a day (QID) | INTRAMUSCULAR | Status: DC
  Administered 2017-07-23 – 2017-07-26 (×11): 50 mg via INTRAVENOUS
  Filled 2017-07-23 (×11): qty 2

## 2017-07-23 MED ORDER — RANITIDINE HCL 150 MG/10ML PO SYRP
150.0000 mg | ORAL_SOLUTION | Freq: Every day | ORAL | Status: DC
  Administered 2017-07-23 – 2017-07-25 (×3): 150 mg
  Filled 2017-07-23 (×7): qty 10

## 2017-07-23 MED ORDER — FAMOTIDINE 40 MG/5ML PO SUSR: 20.0000 mg | Freq: Two times a day (BID) | ORAL | Status: DC

## 2017-07-23 MED ORDER — SIMVASTATIN 40 MG PO TABS
40.0000 mg | ORAL_TABLET | Freq: Every day | ORAL | Status: DC
  Administered 2017-07-23 – 2017-07-24 (×2): 40 mg
  Filled 2017-07-23 (×2): qty 1

## 2017-07-23 MED ORDER — SIMVASTATIN 40 MG PO TABS: 40.0000 mg | ORAL_TABLET | Freq: Every day | ORAL | Status: DC

## 2017-07-23 MED ORDER — FOLIC ACID 1 MG PO TABS
1.0000 mg | ORAL_TABLET | Freq: Every day | ORAL | Status: DC
  Administered 2017-07-23 – 2017-07-25 (×3): 1 mg
  Filled 2017-07-23 (×3): qty 1

## 2017-07-23 MED ORDER — VITAMIN B-1 100 MG PO TABS
100.0000 mg | ORAL_TABLET | Freq: Every day | ORAL | Status: DC
  Administered 2017-07-23 – 2017-07-25 (×3): 100 mg
  Filled 2017-07-23 (×3): qty 1

## 2017-07-23 NOTE — Progress Notes (Signed)
Echocardiogram 2D Echocardiogram has been performed.  Andrew [name] 07/23/2017, 3:15 PM

## 2017-07-23 NOTE — Progress Notes (Addendum)
PULMONARY  / CRITICAL CARE MEDICINE  Name: SLATER MCMANAMAN MRN: [id number] DOB: 1955-11-29    LOS: 86  REFERRING MD :  Dr. Tomi Bamberger  CHIEF COMPLAINT:  Shortness of Breath  BRIEF PATIENT DESCRIPTION:  61 yo male smoker presented to [facility] ER 12/10 with ETOH withdrawal.  required intubation for aspiration pneumonia.  PMHx of PAD, HTN, HLD, GERD, DM.  INTERVAL HISTORY: Remains on full vent support, pressors and CRRT.  VITAL SIGNS: Temp:  [97.3 F (36.3 C)-98.2 F (36.8 C)] 97.5 F (36.4 C) (12/16 0800) Pulse Rate:  [53-120] 56 (12/16 0800) Resp:  [13-22] 15 (12/16 0800) BP: (76-120)/(50-84) 94/58 (12/16 0800) SpO2:  [96 %-100 %] 100 % (12/16 0800) FiO2 (%):  [40 %-50 %] 40 % (12/16 0800) Weight:  [197 lb 8.5 oz (89.6 kg)] 197 lb 8.5 oz (89.6 kg) (12/16 0430)   VENTILATOR SETTINGS: Vent Mode: PRVC FiO2 (%):  [40 %-50 %] 40 % Set Rate:  [14 bmp] 14 bmp Vt Set:  [620 mL] 620 mL PEEP:  [5 cmH20] 5 cmH20 Plateau Pressure:  [14 cmH20-20 cmH20] 16 cmH20   INTAKE / OUTPUT: I/O last 3 completed shifts: In: 6869.5 [I.V.:4000.5; Other:259; NG/GT:2160; IV Piggyback:450] Out: 7059 [Urine:518; Other:6541]  PHYSICAL EXAMINATION:  General - sedated Eyes - pupils reactive ENT - ETT in place Cardiac - irregular, no murmur Chest - b/l rhonchi Abd - soft, mild tenderness mid abdomen, no distention, + bowel sounds Ext - 1+ edema, Lt BKA Skin - no rashes Neuro - RASS -2   LABS: Cbc Recent Labs  Lab 07/21/17 0313 07/22/17 0527 07/23/17 0500  WBC 11.2* 5.5 5.7  HGB 11.6* 10.3* 9.5*  HCT 38.4* 33.4* 29.6*  PLT 35* 27* 22*    Chemistry  Recent Labs  Lab 07/21/17 0313 07/22/17 0527 07/22/17 1541 07/23/17 0500  NA 146*  --  141 139  140  K 4.9  --  3.9 3.5  3.6  CL 120*  --  110 108  108  CO2 14*  --  23 24  24   BUN 62*  --  35* 29*  29*  CREATININE 5.83*  --  3.13* 2.57*  2.60*  CALCIUM 7.0*  --  7.3* 7.3*  7.2*  MG 1.9 1.9  --  1.9  PHOS 6.0*  --  2.7 2.1*   GLUCOSE 138*  --  147* 149*  148*    Liver fxn Recent Labs  Lab 07/17/17 0420 07/22/17 1541 07/23/17 0500  AST 50*  --   --   ALT 45  --   --   ALKPHOS 91  --   --   BILITOT 0.5  --   --   PROT 6.1*  --   --   ALBUMIN 3.3* 2.2* 1.9*   coags Recent Labs  Lab 07/19/17 1018 07/22/17 0527 07/23/17 0500  APTT 50* 38* 59*  INR 2.64  --   --    Sepsis markers Recent Labs  Lab 07/19/17 1130 07/20/17 0826 07/20/17 0940 07/20/17 1203 07/21/17 0313 07/22/17 0527  LATICACIDVEN 2.4* 3.7*  --  2.6*  --   --   PROCALCITON  --   --  38.49  --  51.60 32.93   Cardiac markers No results for input(s): CKTOTAL, CKMB, TROPONINI in the last 168 hours. BNP No results for input(s): PROBNP in the last 168 hours. ABG Recent Labs  Lab 07/17/17 1417  PHART 7.380  PCO2ART 44.4  PO2ART 79.1*  HCO3 25.7    CBG  trend Recent Labs  Lab 07/22/17 1604 07/22/17 1931 07/22/17 2359 07/23/17 0409 07/23/17 0739  GLUCAP 128* 133* 147* 136* 126*    IMAGING: Dg Chest Port 1 View  Result Date: 07/23/2017 CLINICAL DATA:  61 year old male. Alcohol withdrawal. Respiratory failure. Intubated. EXAM: PORTABLE CHEST 1 VIEW COMPARISON:  07/22/2017 and earlier. FINDINGS: Portable AP semi upright views at 0425 hours. Endotracheal tube tip in good position between the level the clavicles and carina. Stable dual lumen left IJ central line. Enteric tube courses to the left upper quadrant and loops over the lower cardiac contour but appears to be in the stomach on the second image. Veiling bilateral pulmonary opacity continues obscuring the diaphragm and lower lungs. Stable cardiac size and mediastinal contours. No pneumothorax. Stable pulmonary vascularity without overt edema. IMPRESSION: 1.  Stable lines and tubes. 2. Stable ventilation since yesterday. Moderate to large bilateral pleural effusions suspected with lower lobe collapse or consolidation. Electronically Signed   By: Genevie Ann M.D.   On:  07/23/2017 06:54   Dg Chest Port 1 View  Result Date: 07/22/2017 CLINICAL DATA:  Follow-up pneumonia EXAM: PORTABLE CHEST 1 VIEW COMPARISON:  07/21/2017 FINDINGS: Endotracheal tube, nasogastric catheter and left jugular central line are again seen and stable. Cardiac shadow is mildly enlarged but stable. Bilateral pleural effusions are again seen. Bibasilar opacities are noted as well stable from the prior exam. No new focal abnormality is seen. IMPRESSION: No change from the prior exam. Electronically Signed   By: Inez Catalina M.D.   On: 07/22/2017 06:56   Dg Chest Port 1 View  Result Date: 07/21/2017 CLINICAL DATA:  Central line placement. EXAM: PORTABLE CHEST 1 VIEW COMPARISON:  07/21/2017 at 0433 hours FINDINGS: Endotracheal tube is unchanged. Enteric tube is partially looped in the stomach with tip in the region of the gastric fundus. A new left jugular catheter terminates over the mid SVC. The cardiac silhouette remains enlarged. Veiling opacities in the lung bases are unchanged and may reflect a combination of pleural fluid and airspace opacity. No pneumothorax is identified. IMPRESSION: 1. Left jugular catheter terminates over the mid SVC. 2. Unchanged cardiomegaly and bibasilar lung opacities which may reflect pleural effusions and atelectasis or airspace disease. Electronically Signed   By: Logan Bores M.D.   On: 07/21/2017 15:42    LINES / TUBES: ETT 12/10 >> Lt IJ HD cath 12/15 >>  CULTURES: Sputum 12/10 >> oral flora Blood 12/10 >> negative Pneumococcal ag 12/10 >> negative Legionella ag 12/10 >> negative C diff 12/13 >> negative Sputum 12/14 >>   ANTIBIOTICS: Unasyn 12/10 >> 12/14 maxipime 12/14>>> vanc 12/14>>> 12/16  STUDIES: Renal u/s 12/13 >> small amount of ascites, normal kidneys  SIGNIFICANT EVENTS:  12/13 noted to have diarrhea bloody stools with a negative C. difficile 12/14 started CVVHD  DISCUSSION: 61 yo with ETOH withdrawal, aspiration pneumonia, VDRF  complicated by acute renal failure and septic shock.  UDS positive for THC.  ASSESSMENT / PLAN:  Acute respiratory failure with hypoxia 2nd to aspiration pneumonia and acute pulmonary edema. Tobacco abuse. - f/u CXR - full vent support - continue cefepime, d/c vancomycin 12/16  Septic shock 2nd to pneumonia. Acute pulmonary edema. Hx of HTN, PACs, PAD. - f/u Echo  - pressors to keep MAP > 65  Acute renal failure from hypovolemia and ATN. - CRRT started 12/15  Moderate protein calorie malnutrition. - tube feeds  Anemia of critical illness and chronic disease. Thrombocytopenia 2nd to sepsis and hx of ETOH. -  HIT Ab negative from 12/12. - f/u CBC, INR  DM type II. - SSI  Acute metabolic encephalopathy. Acute alcohol withdrawal. - precedex for RASS goal -1 to -2 - thiamine, folic acid  DVT prophylaxis - SCDs SUP - protonix Nutrition - tube feeds Goals of care - Full code  CC time 32 minutes  Chesley Mires, MD [place] 07/23/2017, 8:41 AM Pager:  [phone] After 3pm call: [phone]

## 2017-07-23 NOTE — Progress Notes (Signed)
[facility] Progress Note  Subjective: 312 cc uop yest, I/O yest were even.  Scrotal edema worsening per RN. Almost is off the pressors now.   Vitals:   07/23/17 1600 07/23/17 1700 07/23/17 1800 07/23/17 1900  BP: 98/73 (!) 103/53 104/75 111/66  Pulse: 62 (!) 58 64 64  Resp: _0 Temp: (!) 95.2 F (35.1 C) (!) 96.6 F (35.9 C) (!) 97.3 F (36.3 C) 97.9 F (36.6 C)  TempSrc: Core (Comment)  Core   SpO2: 100% 100% 100% 98%  Weight:      Height:        Inpatient medications: . chlorhexidine gluconate (MEDLINE KIT)  15 mL Mouth Rinse BID  . Chlorhexidine Gluconate Cloth  6 each Topical Daily  . folic acid  1 mg Per Tube Daily  . hydrocortisone sod succinate (SOLU-CORTEF) inj  50 mg Intravenous Q6H  . insulin aspart  0-15 Units Subcutaneous Q4H  . mouth rinse  15 mL Mouth Rinse QID  . ranitidine  150 mg Per Tube Daily  . simvastatin  40 mg Per Tube QHS  . sodium chloride flush  10-40 mL Intracatheter Q12H  . thiamine  100 mg Per Tube Daily   . ceFEPime (MAXIPIME) IV Stopped (07/23/17 1225)  . dexmedetomidine 1.2 mcg/kg/hr (07/23/17 2000)  . feeding supplement (VITAL 1.5 CAL) 1,000 mL (07/23/17 2000)  . fentaNYL infusion INTRAVENOUS 275 mcg/hr (07/23/17 2000)  . norepinephrine (LEVOPHED) Adult infusion 2 mcg/min (07/23/17 2000)  . dialysis replacement fluid (prismasate) 400 mL/hr at 07/23/17 1012  . dialysis replacement fluid (prismasate) 200 mL/hr at 07/22/17 1943  . dialysate (PRISMASATE) 2,000 mL/hr at 07/23/17 1550  . sodium chloride 999 mL/hr at 07/21/17 1740   acetaminophen (TYLENOL) oral liquid 160 mg/5 mL, albuterol, alteplase, fentaNYL (SUBLIMAZE) injection, heparin, loperamide, midazolam, sodium chloride, sodium chloride flush  Exam: Gen on the vent sedated Sclera anicteric, throat w ETT  No jvd or bruits Chest clear bilat RRR no MRG Abd soft ntnd no mass or ascites +bs GU normal male w marked penile/ scrotal edema Ext diffuse 2-3+  edema Neuro is sedated, on the vent  07/17/17 UA > negative CXR 12/15 - no chg bibasilar effusions, bilat basilar infiltrates  Impression: 1. Acute kidney injury - oliguric, minimal UOP. Cont CRRT D#3 2. Septic shock - coming off pressors 3. Vol overload - marked edema 4. Pulm edema - by CXR, and up 20 L from admit, needs ^^UF 5. ETOH abuse 6. Asp PNA  7. VDRF   Plan - ^ UF goal to 150 cc/hr minimum and ^ from there is BP's will tolerate   Kelly Splinter MD [place] pager [phone]   07/23/2017, 8:33 PM   Recent Labs  Lab 07/22/17 1541 07/23/17 0500 07/23/17 1606  NA 141 139  140 138  K 3.9 3.5  3.6 3.8  CL 110 108  108 107  CO2 _1 GLUCOSE 147* 149*  148* 144*  BUN 35* 29*  29* 23*  CREATININE 3.13* 2.57*  2.60* 2.09*  CALCIUM 7.3* 7.3*  7.2* 7.4*  PHOS 2.7 2.1* 1.6*   Recent Labs  Lab 07/17/17 0420 07/22/17 1541 07/23/17 0500 07/23/17 1606  AST 50*  --   --   --   ALT 45  --   --   --   ALKPHOS 91  --   --   --   BILITOT 0.5  --   --   --  PROT 6.1*  --   --   --   ALBUMIN 3.3* 2.2* 1.9* 2.1*   Recent Labs  Lab 07/19/17 0554  07/21/17 0313 07/22/17 0527 07/23/17 0500  WBC 15.4*   < > 11.2* 5.5 5.7  NEUTROABS 12.8*  --  8.5*  --  4.6  HGB 12.4*   < > 11.6* 10.3* 9.5*  HCT 40.9   < > 38.4* 33.4* 29.6*  MCV 94.9   < > 95.3 93.3 90.5  PLT 58*   < > 35* 27* 22*   < > = values in this interval not displayed.   Iron/TIBC/Ferritin/ %Sat No results found for: IRON, TIBC, FERRITIN, IRONPCTSAT   

## 2017-07-23 NOTE — Progress Notes (Addendum)
Pt had 4 beats Vtach at 1113. This RN was present at bedside. Pt was asymptomatic and Vtach was non-sustained. Frequent PVC's/PAC's noted throughout the day. Pt's temperature also decreased. Replaced foley catheter with temp foley. MD made aware. No new orders at this time. Will continue to monitor.

## 2017-07-24 ENCOUNTER — Inpatient Hospital Stay (HOSPITAL_COMMUNITY): Payer: Medicare Other

## 2017-07-24 LAB — PROTIME-INR
INR: 1.34
Prothrombin Time: 16.5 seconds — ABNORMAL HIGH (ref 11.4–15.2)

## 2017-07-24 LAB — RENAL FUNCTION PANEL
ALBUMIN: 2.1 g/dL — AB (ref 3.5–5.0)
ALBUMIN: 2.1 g/dL — AB (ref 3.5–5.0)
ANION GAP: 7 (ref 5–15)
Anion gap: 5 (ref 5–15)
BUN: 21 mg/dL — AB (ref 6–20)
BUN: 20 mg/dL (ref 6–20)
CALCIUM: 7.7 mg/dL — AB (ref 8.9–10.3)
CO2: 27 mmol/L (ref 22–32)
CO2: 26 mmol/L (ref 22–32)
CREATININE: 1.67 mg/dL — AB (ref 0.61–1.24)
Calcium: 7.6 mg/dL — ABNORMAL LOW (ref 8.9–10.3)
Chloride: 105 mmol/L (ref 101–111)
Chloride: 104 mmol/L (ref 101–111)
Creatinine, Ser: 1.43 mg/dL — ABNORMAL HIGH (ref 0.61–1.24)
GFR calc Af Amer: 50 mL/min — ABNORMAL LOW (ref >60)
GFR calc non Af Amer: 52 mL/min — ABNORMAL LOW (ref >60)
GFR, EST NON AFRICAN AMERICAN: 43 mL/min — AB (ref >60)
Glucose, Bld: 204 mg/dL — ABNORMAL HIGH (ref 65–99)
Glucose, Bld: 217 mg/dL — ABNORMAL HIGH (ref 65–99)
PHOSPHORUS: 1.6 mg/dL — AB (ref 2.5–4.6)
POTASSIUM: 4 mmol/L (ref 3.5–5.1)
Phosphorus: 1.5 mg/dL — ABNORMAL LOW (ref 2.5–4.6)
Potassium: 4 mmol/L (ref 3.5–5.1)
SODIUM: 137 mmol/L (ref 135–145)
Sodium: 137 mmol/L (ref 135–145)

## 2017-07-24 LAB — MAGNESIUM: MAGNESIUM: 2.1 mg/dL (ref 1.7–2.4)

## 2017-07-24 LAB — GLUCOSE, CAPILLARY
GLUCOSE-CAPILLARY: 203 mg/dL — AB (ref 65–99)
GLUCOSE-CAPILLARY: 207 mg/dL — AB (ref 65–99)
GLUCOSE-CAPILLARY: 172 mg/dL — AB (ref 65–99)
Glucose-Capillary: 175 mg/dL — ABNORMAL HIGH (ref 65–99)
Glucose-Capillary: 152 mg/dL — ABNORMAL HIGH (ref 65–99)
Glucose-Capillary: 149 mg/dL — ABNORMAL HIGH (ref 65–99)

## 2017-07-24 LAB — CBC
HEMATOCRIT: 32.8 % — AB (ref 39.0–52.0)
Hemoglobin: 10.4 g/dL — ABNORMAL LOW (ref 13.0–17.0)
MCH: 28.7 pg (ref 26.0–34.0)
MCHC: 31.7 g/dL (ref 30.0–36.0)
MCV: 90.4 fL (ref 78.0–100.0)
Platelets: 37 10*3/uL — ABNORMAL LOW (ref 150–400)
RBC: 3.63 MIL/uL — AB (ref 4.22–5.81)
RDW: 16.8 % — AB (ref 11.5–15.5)
WBC: 6.9 10*3/uL (ref 4.0–10.5)

## 2017-07-24 LAB — APTT: aPTT: 36 seconds (ref 24–36)

## 2017-07-24 MED ORDER — MIDAZOLAM HCL 2 MG/2ML IJ SOLN
1.0000 mg | INTRAMUSCULAR | Status: DC | PRN
  Administered 2017-07-24 – 2017-07-25 (×8): 2 mg via INTRAVENOUS
  Filled 2017-07-24 (×7): qty 2

## 2017-07-24 MED ORDER — PRO-STAT SUGAR FREE PO LIQD
30.0000 mL | Freq: Every day | ORAL | Status: DC
  Administered 2017-07-24 – 2017-07-25 (×2): 30 mL
  Filled 2017-07-24 (×2): qty 30

## 2017-07-24 MED ORDER — VITAL AF 1.2 CAL PO LIQD
1000.0000 mL | ORAL | Status: DC
  Administered 2017-07-24: 1000 mL
  Filled 2017-07-24 (×7): qty 1000

## 2017-07-24 MED ORDER — SODIUM PHOSPHATES 45 MMOLE/15ML IV SOLN
20.0000 mmol | Freq: Once | INTRAVENOUS | Status: AC
  Administered 2017-07-24: 20 mmol via INTRAVENOUS
  Filled 2017-07-24: qty 6.67

## 2017-07-24 NOTE — Progress Notes (Signed)
Nutrition Follow-up  DOCUMENTATION CODES:   Not applicable  INTERVENTION:  - Will change TF regimen: Vital AF 1.2 @ 55 mL/hr with 30 mL Prostat once/day. This regimen will provide 1684 kcal (94% estimated kcal need), 114 grams of protein, and 1070 mL free water. - PEPuP protocol.  NUTRITION DIAGNOSIS:   Inadequate oral intake related to inability to eat as evidenced by NPO status. -ongoing  GOAL:   Patient will meet greater than or equal to 90% of their needs -will be met with TF regimen  MONITOR:   Vent status, TF tolerance, Weight trends, Labs  ASSESSMENT:   Pt with PMH significant for alcohol abuse DM, HLD, HTN, PAD, squamous cell cancer of the lip, and tobacco abuse. Presents this admission with alcohol withdrawal and aspiration pneumonia with resulting acute respiratory failure.   12/17 Pt remains intubated with OGT in place. No BM since 12/13 and RN reports plan to start bowel regimen d/t this. He was started on CRRT on 12/14 and re-estimated nutrition needs take this into account. Continue to use weight from 12/11 (72.4 kg) to re-estimate needs given moderate edema to all extremities and weight +15.5 kg since that time.   Per Dr. Agustina Caroli note this AM, plan to work on decreasing sedation today. Plan to continue CRRT. Pt currently receiving Vital 1.5 _0  mL/hr which is providing 2160 kcal, 97 grams of protein, and 1100 mL free water.   Patient is currently intubated on ventilator support MV: 9.1 L/min Temp (24hrs), Avg:97.8 F (36.6 C), Min:95.2 F (35.1 C), Max:98.8 F (37.1 C) BP: 94/63 and MAP: 72   Medications reviewed; 1 mg IV folic acid per OGT/day, sliding scale Novolog, 50 mg Solu-cortef QID, 100 mg thiamine per OGT/day.  Labs reviewed; CBG: 175 mg/dL this AM, BUN: 21 mg/dL, creatinine: 1.67 mg/dL, Ca: 7.6 mg/dL, GFR: 43 mL/min.   Drips: Precedex @ 1.2 mcg/kg/hr, Fentanyl @ 300 mcg/hr, Levo @ 1 mcg/min.      12/13 - Current order for Vital 1.5 @ 55 mL/hr  which provides 1980 kcal, 89 grams of protein, and 1008 mL free water.  - RN turned TF off around 10:30 AM in preparation for renal ultrasound today.  - She reports that diarrhea began yesterday/overnight.  - C. Diff was negative and PCCM NP note states plan to start imodium.  - Weight + 21 lbs/9.3 kg compared to weight on 12/11.  Patient is currently intubated on ventilator support MV: 14 L/min Temp (24hrs), Avg:99.5 F (37.5 C), Min:98.1 F (36.7 C), Max:102 F (38.9 C) BP: 95/68 and MAP: 75 Labs; Na: 148 mmol/L, Cl: 120 mmol/L IVF: D5 @ 50 mL/hr (204 kcal) Drips: Precedex @ 1 mcg/kg/hr, Fentanyl @ 75 mcg/hr, Neo @ 30 mcg/min.      12/11 - He is receiving Vital AF 1.2 @ 55 mL/hr. - This regimen is providing 1584 kcal, 99 grams of protein, and 1070 mL free water.  - Will change TF regimen to Vital 1.5 @ 55 mL/hr.   Patient is currently intubated on ventilator support MV:8.9L/min Temp (24hrs), Avg:100 F (37.8 C), Min:97.8 F (36.6 C), Max:102.6 F (39.2 C) BP: 130/99 and MAP: 108   Medication; 2 g IV Mg sulfate x1 dose today IVF: NS @ 100 mL/hr. Drips: Precedex @ 1.2 mcg/kg/hr, Fentanyl @ 100 mcg/hr.      Diet Order:  Diet NPO time specified  EDUCATION NEEDS:   Not appropriate for education at this time  Skin:  Skin Assessment: Reviewed RN Assessment  Last  BM:  12/13  Height:   Ht Readings from Last 1 Encounters:  07/17/17 6' (1.829 m)    Weight:   Wt Readings from Last 1 Encounters:  07/24/17 193 lb 12.6 oz (87.9 kg)    Ideal Body Weight:  80.91 kg  BMI:  Body mass index is 26.28 kg/m.  Estimated Nutritional Needs:   Kcal:  2236  Protein:  87-109 grams (1.2-1.5 grams/kg)  Fluid:  >1.5 L/day     Jarome Matin, MS, RD, LDN, [place] Inpatient Clinical Dietitian Pager # [phone] After hours/weekend pager # [phone]

## 2017-07-24 NOTE — Progress Notes (Signed)
[facility] ROUNDING NOTE   Subjective:   Interval History:  Still with marginal urine output continues on pressors   61 y.o. year-old w/ hx of DM, HL, etoh abuse, HTN, HL PVD who presented with tremors on 07/17/17 and hx etoh abuse.  He decompensated and required intubation and was admitted to the ICU.  Admit creatinine was 0.73  Baseline    Renal US showed 11-12 cm kidneys, no hydro, normal cortex, normal echotexture     Objective:  Vital signs in last 24 hours:  Temp:  [95.2 F (35.1 C)-98.8 F (37.1 C)] 97.7 F (36.5 C) (12/17 1100) Pulse Rate:  [58-71] 59 (12/17 1100) Resp:  [13-17] 14 (12/17 1100) BP: (70-124)/(51-81) 97/63 (12/17 1100) SpO2:  [98 %-100 %] 99 % (12/17 1100) FiO2 (%):  [40 %] 40 % (12/17 0834) Weight:  [193 lb 12.6 oz (87.9 kg)] 193 lb 12.6 oz (87.9 kg) (12/17 0500)  Weight change: -12 oz (-1.7 kg) Filed Weights   07/22/17 0500 07/23/17 0430 07/24/17 0500  Weight: 197 lb 12 oz (89.7 kg) 197 lb 8.5 oz (89.6 kg) 193 lb 12.6 oz (87.9 kg)    Intake/Output: I/O last 3 completed shifts: In: 5450.1 [I.V.:2498.1; Other:592; NG/GT:2160; IV Piggyback:200] Out: 8786 [Urine:943; Other:7722]   Intake/Output this shift:  Total I/O In: 481.8 [I.V.:161.8; Other:80; NG/GT:240] Out: 7672 [Urine:109; Other:1346]  Genon the vent sedated Sclera anicteric, throatw ETT No jvd or bruits Chest clear bilat RRR no MRG Abd soft ntnd no mass or ascites +bs GU normal malew marked penile/ scrotal edema Extdiffuse 2-3+ edema Neuro is sedated, on the vent     Basic Metabolic Panel: Recent Labs  Lab 07/19/17 0554  07/21/17 0313 07/22/17 0527 07/22/17 1541 07/23/17 0500 07/23/17 1606 07/24/17 0445  NA 147*   < > 146*  --  141 139  140 138 137  K 4.0   < > 4.9  --  3.9 3.5  3.6 3.8 4.0  CL 119*   < > 120*  --  110 108  108 107 105  CO2 19*   < > 14*  --  23 24  24 24 27   GLUCOSE 123*   < > 138*  --  147* 149*  148* 144* 204*  BUN  38*   < > 62*  --  35* 29*  29* 23* 21*  CREATININE 2.91*   < > 5.83*  --  3.13* 2.57*  2.60* 2.09* 1.67*  CALCIUM 7.0*   < > 7.0*  --  7.3* 7.3*  7.2* 7.4* 7.6*  MG 1.8  --  1.9 1.9  --  1.9  --  2.1  PHOS  --   --  6.0*  --  2.7 2.1* 1.6* 1.5*   < > = values in this interval not displayed.    Liver Function Tests: Recent Labs  Lab 07/22/17 1541 07/23/17 0500 07/23/17 1606 07/24/17 0445  ALBUMIN 2.2* 1.9* 2.1* 2.1*   No results for input(s): LIPASE, AMYLASE in the last 168 hours. No results for input(s): AMMONIA in the last 168 hours.  CBC: Recent Labs  Lab 07/19/17 0554  07/20/17 0305 07/21/17 0313 07/22/17 0527 07/23/17 0500 07/24/17 0445  WBC 15.4*  --  16.0* 11.2* 5.5 5.7 6.9  NEUTROABS 12.8*  --   --  8.5*  --  4.6  --   HGB 12.4*  --  11.5* 11.6* 10.3* 9.5* 10.4*  HCT 40.9  --  38.4* 38.4* 33.4* 29.6* 32.8*  MCV 94.9  --  96.7 95.3 93.3 90.5 90.4  PLT 58*   < > 33* 35* 27* 22* 37*   < > = values in this interval not displayed.    Cardiac Enzymes: No results for input(s): CKTOTAL, CKMB, CKMBINDEX, TROPONINI in the last 168 hours.  BNP: Invalid input(s): POCBNP  CBG: Recent Labs  Lab 07/23/17 1145 07/23/17 1629 07/23/17 1923 07/23/17 2317 07/24/17 0317  GLUCAP 142* 129* 113* 168* 175*    Microbiology: Results for orders placed or performed during the hospital encounter of 07/17/17  Culture, blood (Routine X 2) w Reflex to ID Panel     Status: None   Collection Time: 07/17/17  1:39 PM  Result Value Ref Range Status   Specimen Description BLOOD LEFT ANTECUBITAL  Final   Special Requests IN PEDIATRIC BOTTLE Blood Culture adequate volume  Final   Culture   Final    NO GROWTH 5 DAYS Performed at [facility], [place] [address] [place], [place] [address]    Report Status 07/22/2017 FINAL  Final  Culture, blood (Routine X 2) w Reflex to ID Panel     Status: None   Collection Time: 07/17/17  1:39 PM  Result Value Ref Range Status   Specimen  Description BLOOD LEFT ARM  Final   Special Requests   Final    BOTTLES DRAWN AEROBIC AND ANAEROBIC Blood Culture adequate volume   Culture   Final    NO GROWTH 5 DAYS Performed at [facility], [place] [address], [place], [place] [address]    Report Status 07/22/2017 FINAL  Final  Culture, respiratory (NON-Expectorated)     Status: None   Collection Time: 07/17/17  5:40 PM  Result Value Ref Range Status   Specimen Description TRACHEAL ASPIRATE  Final   Special Requests NONE  Final   Gram Stain   Final    ABUNDANT WBC PRESENT,BOTH PMN AND MONONUCLEAR FEW GRAM NEGATIVE RODS FEW GRAM POSITIVE COCCI FEW SQUAMOUS EPITHELIAL CELLS PRESENT    Culture   Final    Consistent with normal respiratory flora. Performed at [facility], [place] [address], [place], [place] [address]    Report Status 07/20/2017 FINAL  Final  MRSA PCR Screening     Status: None   Collection Time: 07/19/17  9:34 AM  Result Value Ref Range Status   MRSA by PCR NEGATIVE NEGATIVE Final    Comment:        The GeneXpert MRSA Assay (FDA approved for NASAL specimens only), is one component of a comprehensive MRSA colonization surveillance program. It is not intended to diagnose MRSA infection nor to guide or monitor treatment for MRSA infections.   C difficile quick scan w PCR reflex     Status: None   Collection Time: 07/20/17  4:35 AM  Result Value Ref Range Status   C Diff antigen NEGATIVE NEGATIVE Final   C Diff toxin NEGATIVE NEGATIVE Final   C Diff interpretation No C. difficile detected.  Final  Culture, respiratory (NON-Expectorated)     Status: None   Collection Time: 07/21/17 11:10 AM  Result Value Ref Range Status   Specimen Description TRACHEAL ASPIRATE  Final   Special Requests NONE  Final   Gram Stain   Final    FEW WBC PRESENT,BOTH PMN AND MONONUCLEAR NO SQUAMOUS EPITHELIAL CELLS SEEN NO ORGANISMS SEEN    Culture   Final    NO GROWTH 2 DAYS Performed at [facility]  Lab,  [address] [place], [place] [address]    Report Status 07/23/2017 FINAL  Final    Coagulation Studies: Recent Labs    07/24/17 0445  LABPROT 16.5*  INR 1.34    Urinalysis: No results for input(s): COLORURINE, LABSPEC, PHURINE, GLUCOSEU, HGBUR, BILIRUBINUR, KETONESUR, PROTEINUR, UROBILINOGEN, NITRITE, LEUKOCYTESUR in the last 72 hours.  Invalid input(s): APPERANCEUR    Imaging: Dg Chest Port 1 View  Result Date: 07/24/2017 CLINICAL DATA:  Respiratory failure EXAM: PORTABLE CHEST 1 VIEW COMPARISON:  Yesterday FINDINGS: Endotracheal tube tip at the clavicular heads. IJ catheter from the left with tip at the upper SVC. There is a nasogastric tube which projects over the heart, also seen on image yesterday. This is likely due to obliquity and possibly left diaphragm elevation, intragastric position was convincing on chest x-ray yesterday. No previous imaging to evaluate for hiatal hernia. There is haziness of the bilateral chest consistent with pleural fluid and atelectasis. Consolidation could be obscured. Kerley lines have likely improved. No pneumothorax. Cardiomegaly. IMPRESSION: 1. Stable positioning of tubes and central line. 2. Layering effusions and presumed atelectasis. Kerley lines/interstitial edema may have improved since yesterday. Electronically Signed   By: Monte Fantasia M.D.   On: 07/24/2017 06:43   Dg Chest Port 1 View  Result Date: 07/23/2017 CLINICAL DATA:  61 year old male. Alcohol withdrawal. Respiratory failure. Intubated. EXAM: PORTABLE CHEST 1 VIEW COMPARISON:  07/22/2017 and earlier. FINDINGS: Portable AP semi upright views at 0425 hours. Endotracheal tube tip in good position between the level the clavicles and carina. Stable dual lumen left IJ central line. Enteric tube courses to the left upper quadrant and loops over the lower cardiac contour but appears to be in the stomach on the second image. Veiling bilateral pulmonary opacity continues obscuring the  diaphragm and lower lungs. Stable cardiac size and mediastinal contours. No pneumothorax. Stable pulmonary vascularity without overt edema. IMPRESSION: 1.  Stable lines and tubes. 2. Stable ventilation since yesterday. Moderate to large bilateral pleural effusions suspected with lower lobe collapse or consolidation. Electronically Signed   By: Genevie Ann M.D.   On: 07/23/2017 06:54     Medications:   . ceFEPime (MAXIPIME) IV Stopped (07/24/17 0037)  . dexmedetomidine 1.2 mcg/kg/hr (07/24/17 1594)  . feeding supplement (VITAL AF 1.2 CAL)    . fentaNYL infusion INTRAVENOUS 300 mcg/hr (07/24/17 1014)  . norepinephrine (LEVOPHED) Adult infusion 1 mcg/min (07/24/17 0953)  . dialysis replacement fluid (prismasate) 400 mL/hr at 07/24/17 0322  . dialysis replacement fluid (prismasate) 200 mL/hr at 07/23/17 2234  . dialysate (PRISMASATE) 2,000 mL/hr at 07/24/17 0531  . sodium chloride 999 mL/hr at 07/21/17 1740   . chlorhexidine gluconate (MEDLINE KIT)  15 mL Mouth Rinse BID  . Chlorhexidine Gluconate Cloth  6 each Topical Daily  . feeding supplement (PRO-STAT SUGAR FREE 64)  30 mL Per Tube Daily  . folic acid  1 mg Per Tube Daily  . hydrocortisone sod succinate (SOLU-CORTEF) inj  50 mg Intravenous Q6H  . insulin aspart  0-15 Units Subcutaneous Q4H  . mouth rinse  15 mL Mouth Rinse QID  . ranitidine  150 mg Per Tube Daily  . simvastatin  40 mg Per Tube QHS  . sodium chloride flush  10-40 mL Intracatheter Q12H  . thiamine  100 mg Per Tube Daily   acetaminophen (TYLENOL) oral liquid 160 mg/5 mL, albuterol, alteplase, fentaNYL (SUBLIMAZE) injection, heparin, loperamide, midazolam, sodium chloride, sodium chloride flush  Assessment/ Plan:  1. Acute kidney injury -  oliguric, minimal UOP. Cont CRRT D#4 2. Metabolic acidosis resolved no bicarbonate 3. Electrolytes stable   4. Septic shock - coming off pressors 5. Vol overload - marked edema still problematic with large scrotal edema  6. Pulm edema -  by CXR, and up 20 L from admit, needs ^^UF 7. ETOH abuse 8. Asp PNA  9. VDRF        LOS: 7 Andrew [name] W @TODAY @11 :11 AM

## 2017-07-24 NOTE — Progress Notes (Signed)
PULMONARY  / CRITICAL CARE MEDICINE  Name: Andrew [name] MRN: [id number] DOB: 02/25/1956    LOS: 10  REFERRING MD :  Dr. Tomi Bamberger  CHIEF COMPLAINT:  Shortness of Breath  BRIEF PATIENT DESCRIPTION:  61 yo male smoker presented to [facility] ER 12/10 with ETOH withdrawal.  required intubation for aspiration pneumonia.  PMHx of PAD, HTN, HLD, GERD, DM.  INTERVAL HISTORY: 600 cc urine output for 12/16 Norepinephrine weaned to 1 Remains very sedated, Precedex and fentanyl drips CVVH running, ultrafiltration averaging 200-250 cc/h  VITAL SIGNS: Temp:  [95.2 F (35.1 C)-98.8 F (37.1 C)] 97.7 F (36.5 C) (12/17 1000) Pulse Rate:  [58-71] 64 (12/17 1000) Resp:  [13-17] 14 (12/17 1000) BP: (70-124)/(51-81) 94/63 (12/17 1000) SpO2:  [98 %-100 %] 98 % (12/17 1000) FiO2 (%):  [40 %] 40 % (12/17 0834) Weight:  [87.9 kg (193 lb 12.6 oz)] 87.9 kg (193 lb 12.6 oz) (12/17 0500)   VENTILATOR SETTINGS: Vent Mode: PRVC FiO2 (%):  [40 %] 40 % Set Rate:  [14 bmp] 14 bmp Vt Set:  [620 mL] 620 mL PEEP:  [5 cmH20] 5 cmH20 Plateau Pressure:  [17 cmH20-18 cmH20] 17 cmH20   INTAKE / OUTPUT: I/O last 3 completed shifts: In: 5450.1 [I.V.:2498.1; Other:592; NG/GT:2160; IV Piggyback:200] Out: 4627 [OJJKK:938; Other:7722]  PHYSICAL EXAMINATION:  General -ill-appearing man, intubated, sedated Eyes -pupils normal, reactive ENT -ET tube in place, oropharynx otherwise clear Cardiac -irregularly irregular, no murmur Chest -mostly clear bilaterally, decreased at both bases, no wheezing Abd -soft, no apparent tenderness, positive bowel sounds GU: Significant scrotal edema, no apparent skin breakdown Ext -left BKA, 1+ edema Skin - no rash Neuro -current RASS -3, attempted to open eyes to voice and vigorous stim   LABS: Cbc Recent Labs  Lab 07/22/17 0527 07/23/17 0500 07/24/17 0445  WBC 5.5 5.7 6.9  HGB 10.3* 9.5* 10.4*  HCT 33.4* 29.6* 32.8*  PLT 27* 22* 37*    Chemistry  Recent Labs  Lab  07/22/17 0527  07/23/17 0500 07/23/17 1606 07/24/17 0445  NA  --    < > 139  140 138 137  K  --    < > 3.5  3.6 3.8 4.0  CL  --    < > 108  108 107 105  CO2  --    < > 24  24 24 27   BUN  --    < > 29*  29* 23* 21*  CREATININE  --    < > 2.57*  2.60* 2.09* 1.67*  CALCIUM  --    < > 7.3*  7.2* 7.4* 7.6*  MG 1.9  --  1.9  --  2.1  PHOS  --    < > 2.1* 1.6* 1.5*  GLUCOSE  --    < > 149*  148* 144* 204*   < > = values in this interval not displayed.    Liver fxn Recent Labs  Lab 07/23/17 0500 07/23/17 1606 07/24/17 0445  ALBUMIN 1.9* 2.1* 2.1*   coags Recent Labs  Lab 07/19/17 1018 07/22/17 0527 07/23/17 0500 07/24/17 0445  APTT 50* 38* 59* 36  INR 2.64  --   --  1.34   Sepsis markers Recent Labs  Lab 07/19/17 1130 07/20/17 0826 07/20/17 0940 07/20/17 1203 07/21/17 0313 07/22/17 0527  LATICACIDVEN 2.4* 3.7*  --  2.6*  --   --   PROCALCITON  --   --  38.49  --  51.60 32.93   Cardiac markers  No results for input(s): CKTOTAL, CKMB, TROPONINI in the last 168 hours. BNP No results for input(s): PROBNP in the last 168 hours. ABG Recent Labs  Lab 07/17/17 1417  PHART 7.380  PCO2ART 44.4  PO2ART 79.1*  HCO3 25.7    CBG trend Recent Labs  Lab 07/23/17 1145 07/23/17 1629 07/23/17 1923 07/23/17 2317 07/24/17 0317  GLUCAP 142* 129* 113* 168* 175*    IMAGING: Dg Chest Port 1 View  Result Date: 07/24/2017 CLINICAL DATA:  Respiratory failure EXAM: PORTABLE CHEST 1 VIEW COMPARISON:  Yesterday FINDINGS: Endotracheal tube tip at the clavicular heads. IJ catheter from the left with tip at the upper SVC. There is a nasogastric tube which projects over the heart, also seen on image yesterday. This is likely due to obliquity and possibly left diaphragm elevation, intragastric position was convincing on chest x-ray yesterday. No previous imaging to evaluate for hiatal hernia. There is haziness of the bilateral chest consistent with pleural fluid and  atelectasis. Consolidation could be obscured. Kerley lines have likely improved. No pneumothorax. Cardiomegaly. IMPRESSION: 1. Stable positioning of tubes and central line. 2. Layering effusions and presumed atelectasis. Kerley lines/interstitial edema may have improved since yesterday. Electronically Signed   By: Monte Fantasia M.D.   On: 07/24/2017 06:43   Dg Chest Port 1 View  Result Date: 07/23/2017 CLINICAL DATA:  61 year old male. Alcohol withdrawal. Respiratory failure. Intubated. EXAM: PORTABLE CHEST 1 VIEW COMPARISON:  07/22/2017 and earlier. FINDINGS: Portable AP semi upright views at 0425 hours. Endotracheal tube tip in good position between the level the clavicles and carina. Stable dual lumen left IJ central line. Enteric tube courses to the left upper quadrant and loops over the lower cardiac contour but appears to be in the stomach on the second image. Veiling bilateral pulmonary opacity continues obscuring the diaphragm and lower lungs. Stable cardiac size and mediastinal contours. No pneumothorax. Stable pulmonary vascularity without overt edema. IMPRESSION: 1.  Stable lines and tubes. 2. Stable ventilation since yesterday. Moderate to large bilateral pleural effusions suspected with lower lobe collapse or consolidation. Electronically Signed   By: Genevie Ann M.D.   On: 07/23/2017 06:54    LINES / TUBES: ETT 12/10 >> Lt IJ HD cath 12/15 >>  CULTURES: Sputum 12/10 >> oral flora Blood 12/10 >> negative Pneumococcal ag 12/10 >> negative Legionella ag 12/10 >> negative C diff 12/13 >> negative Sputum 12/14 >> negative  ANTIBIOTICS: Unasyn 12/10 >> 12/14 maxipime 12/14>>> vanc 12/14>>> 12/16  STUDIES: Renal u/s 12/13 >> small amount of ascites, normal kidneys  SIGNIFICANT EVENTS:  12/13 noted to have diarrhea bloody stools with a negative C. difficile 12/14 started CVVHD  DISCUSSION: 61 yo with ETOH withdrawal, aspiration pneumonia, VDRF complicated by acute renal failure  and septic shock.  UDS positive for THC.  ASSESSMENT / PLAN:  Acute respiratory failure with hypoxia 2nd to aspiration pneumonia and acute pulmonary edema. Tobacco abuse. Bilateral pleural effusions, likely related to overall volume status Continue current ventilator support.  Push for pressure support in the near future as his mental status will tolerate.  Work on decreasing sedating medications 12/17 Continue empiric antibiotics, no respiratory culture negative Volume removal with CVVH as he can tolerate Follow chest x-ray  Septic shock 2nd to pneumonia. Acute pulmonary edema. Diffuse cardiac hypokinesis, EF 20-25%, question due to sepsis Hx of HTN, PACs, PAD. Weaning norepinephrine as able May require further cardiac evaluation given his apparent cardiomyopathy.  Noted also has diastolic dysfunction based on echocardiogram  Acute renal  failure from hypovolemia and ATN. Total body volume overload Continue current CVVHD with ultrafiltration as he can tolerate.  Appreciate renal service assistance Follow BMP and urine output (appears to be improving)  Moderate protein calorie malnutrition. Continue tube feeding as ordered  Anemia of critical illness and chronic disease. Thrombocytopenia 2nd to sepsis and hx of ETOH. - HIT Ab negative from 12/12. Follow CBC, coags Transfusion threshold hemoglobin greater than 7  DM type II. Sliding scale insulin per protocol  Acute metabolic encephalopathy. Acute alcohol withdrawal. Sedation for mechanical ventilation Current Precedex 1.2, fentanyl 300 and poorly responsive Decrease fentanyl to achieve current rest goal -1 Continue thiamine and folate  Family: Dr Lamonte Sakai called brother Gerald Stabs 07/24/17, left message.   DVT prophylaxis - SCDs SUP - protonix Nutrition - tube feeds Goals of care - Full code  Independent CC time 33 minutes  Baltazar Apo, MD, PhD 07/24/2017, 10:39 AM [facility] [phone] or if no  answer [phone]

## 2017-07-24 NOTE — Progress Notes (Signed)
eLink Physician-Brief Progress Note Patient Name: Andrew [name] DOB: 1955/09/06 MRN: [id number]   Date of Service  07/24/2017  HPI/Events of Note  Agitation - Already on Precedex and Fentanyl IV infusion and Versed 2 mg IV Q 2 hours.   eICU Interventions  Will order: 1. Increase Versed dose frequency to Q 1 hour PRN.      Intervention Category Major Interventions: Delirium, psychosis, severe agitation - evaluation and management  Loman Logan Eugene 07/24/2017, 11:50 PM

## 2017-07-25 ENCOUNTER — Inpatient Hospital Stay (HOSPITAL_COMMUNITY): Payer: Medicare Other

## 2017-07-25 LAB — RENAL FUNCTION PANEL
ANION GAP: 9 (ref 5–15)
Albumin: 2.2 g/dL — ABNORMAL LOW (ref 3.5–5.0)
Albumin: 2.1 g/dL — ABNORMAL LOW (ref 3.5–5.0)
Anion gap: 7 (ref 5–15)
BUN: 23 mg/dL — AB (ref 6–20)
BUN: 24 mg/dL — ABNORMAL HIGH (ref 6–20)
CALCIUM: 7.9 mg/dL — AB (ref 8.9–10.3)
CALCIUM: 7.4 mg/dL — AB (ref 8.9–10.3)
CO2: 26 mmol/L (ref 22–32)
CO2: 23 mmol/L (ref 22–32)
CREATININE: 1.26 mg/dL — AB (ref 0.61–1.24)
Chloride: 105 mmol/L (ref 101–111)
Chloride: 106 mmol/L (ref 101–111)
Creatinine, Ser: 1.24 mg/dL (ref 0.61–1.24)
GFR calc Af Amer: >60 mL/min (ref >60)
GFR calc Af Amer: >60 mL/min (ref >60)
GFR calc non Af Amer: >60 mL/min (ref >60)
GFR calc non Af Amer: >60 mL/min (ref >60)
GLUCOSE: 181 mg/dL — AB (ref 65–99)
GLUCOSE: 205 mg/dL — AB (ref 65–99)
Phosphorus: 2.2 mg/dL — ABNORMAL LOW (ref 2.5–4.6)
Phosphorus: 2 mg/dL — ABNORMAL LOW (ref 2.5–4.6)
Potassium: 3.8 mmol/L (ref 3.5–5.1)
Potassium: 3.7 mmol/L (ref 3.5–5.1)
SODIUM: 138 mmol/L (ref 135–145)
SODIUM: 138 mmol/L (ref 135–145)

## 2017-07-25 LAB — GLUCOSE, CAPILLARY
GLUCOSE-CAPILLARY: 158 mg/dL — AB (ref 65–99)
GLUCOSE-CAPILLARY: 206 mg/dL — AB (ref 65–99)
Glucose-Capillary: 200 mg/dL — ABNORMAL HIGH (ref 65–99)
Glucose-Capillary: 122 mg/dL — ABNORMAL HIGH (ref 65–99)
Glucose-Capillary: 179 mg/dL — ABNORMAL HIGH (ref 65–99)
Glucose-Capillary: 198 mg/dL — ABNORMAL HIGH (ref 65–99)

## 2017-07-25 LAB — CBC
HCT: 35.3 % — ABNORMAL LOW (ref 39.0–52.0)
Hemoglobin: 11 g/dL — ABNORMAL LOW (ref 13.0–17.0)
MCH: 28.4 pg (ref 26.0–34.0)
MCHC: 31.2 g/dL (ref 30.0–36.0)
MCV: 91 fL (ref 78.0–100.0)
PLATELETS: 52 10*3/uL — AB (ref 150–400)
RBC: 3.88 MIL/uL — ABNORMAL LOW (ref 4.22–5.81)
RDW: 16.9 % — ABNORMAL HIGH (ref 11.5–15.5)
WBC: 7.8 10*3/uL (ref 4.0–10.5)

## 2017-07-25 MED ORDER — HALOPERIDOL LACTATE 5 MG/ML IJ SOLN
2.0000 mg | INTRAMUSCULAR | Status: DC | PRN
  Administered 2017-07-25 – 2017-07-26 (×6): 2 mg via INTRAVENOUS
  Filled 2017-07-25 (×8): qty 1

## 2017-07-25 MED ORDER — MIDAZOLAM HCL 2 MG/2ML IJ SOLN
1.0000 mg | INTRAMUSCULAR | Status: DC | PRN
  Administered 2017-07-25 (×2): 2 mg via INTRAVENOUS
  Administered 2017-07-25: 1 mg via INTRAVENOUS
  Filled 2017-07-25 (×4): qty 2

## 2017-07-25 MED ORDER — DEXMEDETOMIDINE HCL IN NACL 400 MCG/100ML IV SOLN
0.4000 ug/kg/h | INTRAVENOUS | Status: DC
  Administered 2017-07-25: 2.4 ug/kg/h via INTRAVENOUS
  Administered 2017-07-25: 2 ug/kg/h via INTRAVENOUS
  Administered 2017-07-25: 2.4 ug/kg/h via INTRAVENOUS
  Administered 2017-07-25: 2 ug/kg/h via INTRAVENOUS
  Administered 2017-07-25: 1.2 ug/kg/h via INTRAVENOUS
  Administered 2017-07-26 – 2017-07-27 (×9): 2.4 ug/kg/h via INTRAVENOUS
  Administered 2017-07-27: 2.2 ug/kg/h via INTRAVENOUS
  Administered 2017-07-27: 2.4 ug/kg/h via INTRAVENOUS
  Administered 2017-07-27: 1.6 ug/kg/h via INTRAVENOUS
  Administered 2017-07-27: 1.5 ug/kg/h via INTRAVENOUS
  Administered 2017-07-27: 2.4 ug/kg/h via INTRAVENOUS
  Administered 2017-07-27: 1.5 ug/kg/h via INTRAVENOUS
  Administered 2017-07-28 (×2): 0.6 ug/kg/h via INTRAVENOUS
  Filled 2017-07-25 (×28): qty 100

## 2017-07-25 MED ORDER — HALOPERIDOL LACTATE 5 MG/ML IJ SOLN
4.0000 mg | Freq: Once | INTRAMUSCULAR | Status: AC
  Administered 2017-07-25: 4 mg via INTRAVENOUS
  Filled 2017-07-25: qty 1

## 2017-07-25 NOTE — Progress Notes (Signed)
Pt extubated per MD orders. Pt began thrashing around and trying to come out of restraints. Fentanyl had been turned off, and Precedex titrated to 1.2 . MD Byrum gave verbal order if pt couldn't remain calm, to turn off CVVHDF due to the safety of pt.

## 2017-07-25 NOTE — Progress Notes (Signed)
Date: July 25, 2017 Velva Harman, BSN, [place], [place] Chart and notes review for patient progress and needs. Will follow for case management and discharge needs. Next review date: [id number]

## 2017-07-25 NOTE — Progress Notes (Signed)
PULMONARY  / CRITICAL CARE MEDICINE  Name: Andrew [name] MRN: [id number] DOB: 02-14-1956    LOS: 41  REFERRING MD :  Dr. Tomi Bamberger  CHIEF COMPLAINT:  Shortness of Breath  BRIEF PATIENT DESCRIPTION:  61 yo male smoker presented to [facility] ER 12/10 with ETOH withdrawal.  required intubation for aspiration pneumonia.  PMHx of PAD, HTN, HLD, GERD, DM.  INTERVAL HISTORY: 400 cc urine output last 24 hours.  Volume overload improving Norepinephrine currently at 2 Fentanyl increased to 400, sedation increased overnight.  Currently comatose  VITAL SIGNS: Temp:  [95.5 F (35.3 C)-98.8 F (37.1 C)] 95.7 F (35.4 C) (12/18 1000) Pulse Rate:  [44-93] 62 (12/18 0900) Resp:  [13-20] 14 (12/18 1000) BP: (61-132)/(47-87) 120/87 (12/18 1000) SpO2:  [96 %-100 %] 100 % (12/18 0900) FiO2 (%):  [35 %-40 %] 35 % (12/18 0830) Weight:  [82.1 kg (180 lb 16 oz)] 82.1 kg (180 lb 16 oz) (12/18 0500)   VENTILATOR SETTINGS: Vent Mode: PRVC FiO2 (%):  [35 %-40 %] 35 % Set Rate:  [14 bmp] 14 bmp Vt Set:  [62 mL-640 mL] 620 mL PEEP:  [5 cmH20] 5 cmH20 Plateau Pressure:  [15 cmH20-28 cmH20] 18 cmH20   INTAKE / OUTPUT: I/O last 3 completed shifts: In: 5258.3 [I.V.:2061.1; Other:530; NG/GT:2090.6; IV Piggyback:576.7] Out: 53299 [Urine:731; MEQAS:34196]  PHYSICAL EXAMINATION:  General -intubated, sedated, ill-appearing Eyes -pupils small, minimally reactive ENT -endotracheal tube in place, oropharynx moist, otherwise clear Cardiac -irregular, distant, no murmur Chest -clear anteriorly, decreased at both bases, no wheezing Abd -soft, no apparent tenderness, positive bowel sounds GU: Significant scrotal edema, no apparent skin injury or breakdown Ext - 1+ edema, left BKA Skin - no rash Neuro -current rashes -3 to -4.  On high-dose fentanyl, Precedex, intermittent Versed   LABS: Cbc Recent Labs  Lab 07/23/17 0500 07/24/17 0445 07/25/17 0503  WBC 5.7 6.9 7.8  HGB 9.5* 10.4* 11.0*  HCT 29.6* 32.8*  35.3*  PLT 22* 37* 52*    Chemistry  Recent Labs  Lab 07/22/17 0527  07/23/17 0500  07/24/17 0445 07/24/17 1638 07/25/17 0503  NA  --    < > 139  140   < > 137 137 138  K  --    < > 3.5  3.6   < > 4.0 4.0 3.8  CL  --    < > 108  108   < > 105 104 105  CO2  --    < > 24  24   < > 27 26 26   BUN  --    < > 29*  29*   < > 21* 20 23*  CREATININE  --    < > 2.57*  2.60*   < > 1.67* 1.43* 1.26*  CALCIUM  --    < > 7.3*  7.2*   < > 7.6* 7.7* 7.9*  MG 1.9  --  1.9  --  2.1  --   --   PHOS  --    < > 2.1*   < > 1.5* 1.6* 2.2*  GLUCOSE  --    < > 149*  148*   < > 204* 217* 181*   < > = values in this interval not displayed.    Liver fxn Recent Labs  Lab 07/24/17 0445 07/24/17 1638 07/25/17 0503  ALBUMIN 2.1* 2.1* 2.2*   coags Recent Labs  Lab 07/19/17 1018 07/22/17 0527 07/23/17 0500 07/24/17 0445  APTT 50* 38* 59* 36  INR 2.64  --   --  1.34   Sepsis markers Recent Labs  Lab 07/19/17 1130 07/20/17 0826 07/20/17 0940 07/20/17 1203 07/21/17 0313 07/22/17 0527  LATICACIDVEN 2.4* 3.7*  --  2.6*  --   --   PROCALCITON  --   --  38.49  --  51.60 32.93   Cardiac markers No results for input(s): CKTOTAL, CKMB, TROPONINI in the last 168 hours. BNP No results for input(s): PROBNP in the last 168 hours. ABG No results for input(s): PHART, PCO2ART, PO2ART, HCO3, TCO2 in the last 168 hours.  CBG trend Recent Labs  Lab 07/24/17 1125 07/24/17 1558 07/24/17 1919 07/24/17 2319 07/25/17 0318  GLUCAP 149* 203* 207* 172* 200*    IMAGING: Dg Chest Port 1 View  Result Date: 07/25/2017 CLINICAL DATA:  Acute respiratory failure EXAM: PORTABLE CHEST 1 VIEW COMPARISON:  07/24/2017 FINDINGS: Endotracheal tube in good position. Central line tip in the SVC. NG tube in the stomach. Left chest tube in place and unchanged. No pneumothorax Improvement in bilateral airspace disease. Mild improvement in bilateral effusions and bibasilar atelectasis. IMPRESSION: Improvement in  pulmonary edema and bilateral effusions. Moderate bilateral effusions and bibasilar atelectasis have improved. Support lines remain in good position. Electronically Signed   By: Franchot Gallo M.D.   On: 07/25/2017 09:20   Dg Chest Port 1 View  Result Date: 07/24/2017 CLINICAL DATA:  Respiratory failure EXAM: PORTABLE CHEST 1 VIEW COMPARISON:  Yesterday FINDINGS: Endotracheal tube tip at the clavicular heads. IJ catheter from the left with tip at the upper SVC. There is a nasogastric tube which projects over the heart, also seen on image yesterday. This is likely due to obliquity and possibly left diaphragm elevation, intragastric position was convincing on chest x-ray yesterday. No previous imaging to evaluate for hiatal hernia. There is haziness of the bilateral chest consistent with pleural fluid and atelectasis. Consolidation could be obscured. Kerley lines have likely improved. No pneumothorax. Cardiomegaly. IMPRESSION: 1. Stable positioning of tubes and central line. 2. Layering effusions and presumed atelectasis. Kerley lines/interstitial edema may have improved since yesterday. Electronically Signed   By: Monte Fantasia M.D.   On: 07/24/2017 06:43    LINES / TUBES: ETT 12/10 >> Lt IJ HD cath 12/15 >>  CULTURES: Sputum 12/10 >> oral flora Blood 12/10 >> negative Pneumococcal ag 12/10 >> negative Legionella ag 12/10 >> negative C diff 12/13 >> negative Sputum 12/14 >> negative  ANTIBIOTICS: Unasyn 12/10 >> 12/14 maxipime 12/14>>> vanc 12/14>>> 12/16  STUDIES: Renal u/s 12/13 >> small amount of ascites, normal kidneys  SIGNIFICANT EVENTS:  12/13 noted to have diarrhea bloody stools with a negative C. difficile 12/14 started CVVHD  DISCUSSION: 61 yo with ETOH withdrawal, aspiration pneumonia, VDRF complicated by acute renal failure and septic shock.  UDS positive for THC.  Large issue last 48 hours has been sedation needs.  Too sedated for spontaneous breathing  trials.  ASSESSMENT / PLAN:  Acute respiratory failure with hypoxia 2nd to aspiration pneumonia and acute pulmonary edema. Tobacco abuse. Bilateral pleural effusions, likely related to overall volume status Continue current ventilator support.  Need to decrease sedation so that we can push for pressure support in the near future. If unable to tolerate decrease sedating medications may need to consider tracheostomy as an option to avoid discomfort, progress with weaning trials Empiric antibiotics as ordered Follow chest x-ray Volume removal with CVVH as his blood pressure is able to tolerate  Septic shock 2nd to pneumonia. Acute pulmonary  edema. Diffuse cardiac hypokinesis, EF 20-25%, question due to sepsis Hx of HTN, PACs, PAD. Continue norepinephrine.  Suspect we will be able to wean this as his sedation needs improve New cardiomyopathy, will need ischemic workup once he stabilizes from this acute event   Acute renal failure from hypovolemia and ATN. Total body volume overload Agree with continued CVVH and ultrafiltration.  Currently 200-250cc/h Appreciate renal service assistance Follow BMP and urine output  Moderate protein calorie malnutrition. Tube feeding as ordered  Anemia of critical illness and chronic disease. Thrombocytopenia 2nd to sepsis and hx of ETOH. - HIT Ab negative from 12/12. Follow coags and CBC Transfusion threshold hemoglobin greater than 7  DM type II. SSI per protocol  Acute metabolic encephalopathy. Acute alcohol withdrawal. Sedation for mechanical ventilation RASS goal -1 Increase Precedex ceiling to 2.4, decrease fentanyl ceiling to 200 Decreased frequency of Versed Thiamine and folate   Family: Dr Lamonte Sakai called brother Andrew [name] 07/24/17, left message.   DVT prophylaxis - SCDs SUP - protonix Nutrition - tube feeds Goals of care - Full code  Independent CC time 34 minutes  Baltazar Apo, MD, PhD 07/25/2017, 10:40 AM [facility] [phone] or if no answer [phone]

## 2017-07-25 NOTE — Progress Notes (Signed)
eLink Physician-Brief Progress Note Patient Name: Andrew [name] DOB: 11-Jan-1956 MRN: [id number]   Date of Service  07/25/2017  HPI/Events of Note  Delirium - Remains agitated.   eICU Interventions  Will order: 1. Haldol 4 mg IV X 1 now.      Intervention Category Major Interventions: Delirium, psychosis, severe agitation - evaluation and management  Sommer,Steven Eugene 07/25/2017, 10:25 PM

## 2017-07-25 NOTE — Progress Notes (Signed)
Notified Brandy NP in regards to patient trying to self extubate, patient biting down on ETT, thrashing, and currently maxed out on fentanyl and precedex based upon parameters. NP Brandy ordered to place patient in Bilateral wrist restraints for now to protect airway. Will continue to monitor and assess patient.

## 2017-07-25 NOTE — Progress Notes (Signed)
Pharmacy Antibiotic Note  Andrew [name] is a 61 y.o. male admitted on 07/17/2017 with pneumonia.  Pharmacy has been consulted for ampicillin/sulbactam dosing. Patient presents with alcohol withdrawal and concerns for aspiration pneumonia. Ordered vancomycin and cefepime x 1 in ED.  Orders for pharmacy to dose vancomycin and cefepime starting 12/14 as PCT rising, fevers 12/13 PM.    Today, 07/25/2017  CRRT initiated 12/14  WBC remains WNL  Afebrile  Cultures unrevealing  Plan: Per CRRT dosing  Continue Cefepime to 2gm IV q12h  Daily CMP ordered per PCCM  Height: 6' (182.9 cm) Weight: 180 lb 16 oz (82.1 kg) IBW/kg (Calculated) : 77.6  Temp (24hrs), Avg:97.5 F (36.4 C), Min:95.5 F (35.3 C), Max:98.8 F (37.1 C)  Recent Labs  Lab 07/19/17 1130  07/20/17 0826 07/20/17 1203 07/21/17 0313 07/22/17 0500 07/22/17 0527  07/23/17 0500 07/23/17 1606 07/24/17 0445 07/24/17 1638 07/25/17 0503  WBC  --    < >  --   --  11.2*  --  5.5  --  5.7  --  6.9  --  7.8  CREATININE  --    < >  --   --  5.83*  --   --    < > 2.57*  2.60* 2.09* 1.67* 1.43* 1.26*  LATICACIDVEN 2.4*  --  3.7* 2.6*  --   --   --   --   --   --   --   --   --   VANCORANDOM  --   --   --   --   --  16  --   --   --   --   --   --   --    < > = values in this interval not displayed.    Estimated Creatinine Clearance: 69.3 mL/min (A) (by C-G formula based on SCr of 1.26 mg/dL (H)).    No Known Allergies  Antimicrobials this admission:  12/10 unasyn >> 12/14 12/14 vanco >> 12/16 12/14 cefepime >>  Dose adjustments this admission:  12/12 adjust unasyn from q6h to q12h for CrCl<30 12/14 vanco 2gm x 1 at 12:45P 12/15: 0500 random VL = 16  Microbiology results:  12/10 Strep Ag: neg 12/10 BCx: ngf 12/10 TA: normal flora 12/12 MRSA screen neg 12/13 Cdiff neg 12/14 TA: ngtd  Thank you for allowing pharmacy to be a part of this patient's care.  Gretta Arab PharmD, BCPS Pager  [phone] 07/25/2017 12:56 PM

## 2017-07-25 NOTE — Progress Notes (Signed)
Inpatient Diabetes Program Recommendations  AACE/ADA: New Consensus Statement on Inpatient Glycemic Control (2015)  Target Ranges:  Prepandial:   less than 140 mg/dL      Peak postprandial:   less than 180 mg/dL (1-2 hours)      Critically ill patients:  140 - 180 mg/dL   Lab Results  Component Value Date   GLUCAP 206 (H) 07/25/2017   HGBA1C 5.3 06/16/2016    Review of Glycemic Control  Diabetes history: Glucose Intolerance Outpatient Diabetes medications: None Current orders for Inpatient glycemic control: Novolog 0-15 units Q4H  TFs at 55/hour  Inpatient Diabetes Program Recommendations:     ICU Hyperglycemia Protocol Need updated HgbA1C. Last one 06/16/2016 - 5.3% TF coverage needed.  Will continue to follow.  Thank you. Lorenda Peck, RD, LDN, CDE Inpatient Diabetes Coordinator [phone]

## 2017-07-25 NOTE — Progress Notes (Signed)
[facility] ROUNDING NOTE   Subjective:   Interval History:  Still with marginal urine output continues on pressors   61 y.o.year-old w/ hx of DM, HL, etoh abuse, HTN, HL PVD who presented with tremors on 07/17/17 and hx etoh abuse. He decompensated and required intubation and was admitted to the ICU. Admit creatinine was 0.73  Baseline  Renal US showed 11-12 cm kidneys, no hydro, normal cortex, normal echotexture  Still appears to be doing poorly requiring pressors amd minimal urine output   Objective:  Vital signs in last 24 hours:  Temp:  [95.5 F (35.3 C)-98.8 F (37.1 C)] 95.7 F (35.4 C) (12/18 0900) Pulse Rate:  [44-93] 62 (12/18 0900) Resp:  [13-20] 14 (12/18 0900) BP: (61-132)/(47-81) 102/73 (12/18 0900) SpO2:  [96 %-100 %] 100 % (12/18 0900) FiO2 (%):  [35 %-40 %] 35 % (12/18 0830) Weight:  [180 lb 16 oz (82.1 kg)] 180 lb 16 oz (82.1 kg) (12/18 0500)  Weight change: -12.6 oz (-5.8 kg) Filed Weights   07/23/17 0430 07/24/17 0500 07/25/17 0500  Weight: 197 lb 8.5 oz (89.6 kg) 193 lb 12.6 oz (87.9 kg) 180 lb 16 oz (82.1 kg)    Intake/Output: I/O last 3 completed shifts: In: 5258.3 [I.V.:2061.1; Other:530; NG/GT:2090.6; IV Piggyback:576.7] Out: 15726 [Urine:731; OMBTD:97416]   Intake/Output this shift:  Total I/O In: 250.8 [I.V.:120.8; Other:20; NG/GT:110] Out: 674 [Urine:20; Other:654]  Genon the vent sedated Sclera anicteric, throatw ETT No jvd or bruits Chest clear bilat RRR no MRG Abd soft ntnd no mass or ascites +bs GU normal malewmarkedpenile/ scrotal edema Extdiffuse 2-3+ edema Neuro is sedated, on the vent     Basic Metabolic Panel: Recent Labs  Lab 07/19/17 0554  07/21/17 0313 07/22/17 0527  07/23/17 0500 07/23/17 1606 07/24/17 0445 07/24/17 1638 07/25/17 0503  NA 147*   < > 146*  --    < > 139  140 138 137 137 138  K 4.0   < > 4.9  --    < > 3.5  3.6 3.8 4.0 4.0 3.8  CL 119*   < > 120*  --    < > 108   108 107 105 104 105  CO2 19*   < > 14*  --    < > _0 GLUCOSE 123*   < > 138*  --    < > 149*  148* 144* 204* 217* 181*  BUN 38*   < > 62*  --    < > 29*  29* 23* 21* 20 23*  CREATININE 2.91*   < > 5.83*  --    < > 2.57*  2.60* 2.09* 1.67* 1.43* 1.26*  CALCIUM 7.0*   < > 7.0*  --    < > 7.3*  7.2* 7.4* 7.6* 7.7* 7.9*  MG 1.8  --  1.9 1.9  --  1.9  --  2.1  --   --   PHOS  --   --  6.0*  --    < > 2.1* 1.6* 1.5* 1.6* 2.2*   < > = values in this interval not displayed.    Liver Function Tests: Recent Labs  Lab 07/23/17 0500 07/23/17 1606 07/24/17 0445 07/24/17 1638 07/25/17 0503  ALBUMIN 1.9* 2.1* 2.1* 2.1* 2.2*   No results for input(s): LIPASE, AMYLASE in the last 168 hours. No results for input(s): AMMONIA in the last 168 hours.  CBC: Recent Labs  Lab 07/19/17 0554  07/21/17 0313 07/22/17 0527 07/23/17 0500 07/24/17 0445 07/25/17 0503  WBC 15.4*   < > 11.2* 5.5 5.7 6.9 7.8  NEUTROABS 12.8*  --  8.5*  --  4.6  --   --   HGB 12.4*   < > 11.6* 10.3* 9.5* 10.4* 11.0*  HCT 40.9   < > 38.4* 33.4* 29.6* 32.8* 35.3*  MCV 94.9   < > 95.3 93.3 90.5 90.4 91.0  PLT 58*   < > 35* 27* 22* 37* 52*   < > = values in this interval not displayed.    Cardiac Enzymes: No results for input(s): CKTOTAL, CKMB, CKMBINDEX, TROPONINI in the last 168 hours.  BNP: Invalid input(s): POCBNP  CBG: Recent Labs  Lab 07/24/17 1125 07/24/17 1558 07/24/17 1919 07/24/17 2319 07/25/17 0318  GLUCAP 149* 203* 207* 172* 200*    Microbiology: Results for orders placed or performed during the hospital encounter of 07/17/17  Culture, blood (Routine X 2) w Reflex to ID Panel     Status: None   Collection Time: 07/17/17  1:39 PM  Result Value Ref Range Status   Specimen Description BLOOD LEFT ANTECUBITAL  Final   Special Requests IN PEDIATRIC BOTTLE Blood Culture adequate volume  Final   Culture   Final    NO GROWTH 5 DAYS Performed at [facility], [place] [address] [place], [place] [address]    Report Status 07/22/2017 FINAL  Final  Culture, blood (Routine X 2) w Reflex to ID Panel     Status: None   Collection Time: 07/17/17  1:39 PM  Result Value Ref Range Status   Specimen Description BLOOD LEFT ARM  Final   Special Requests   Final    BOTTLES DRAWN AEROBIC AND ANAEROBIC Blood Culture adequate volume   Culture   Final    NO GROWTH 5 DAYS Performed at [facility], [place] [address] [place], [place] [address]    Report Status 07/22/2017 FINAL  Final  Culture, respiratory (NON-Expectorated)     Status: None   Collection Time: 07/17/17  5:40 PM  Result Value Ref Range Status   Specimen Description TRACHEAL ASPIRATE  Final   Special Requests NONE  Final   Gram Stain   Final    ABUNDANT WBC PRESENT,BOTH PMN AND MONONUCLEAR FEW GRAM NEGATIVE RODS FEW GRAM POSITIVE COCCI FEW SQUAMOUS EPITHELIAL CELLS PRESENT    Culture   Final    Consistent with normal respiratory flora. Performed at [facility], [place] [address] [place],  [address]    Report Status 07/20/2017 FINAL  Final  MRSA PCR Screening     Status: None   Collection Time: 07/19/17  9:34 AM  Result Value Ref Range Status   MRSA by PCR NEGATIVE NEGATIVE Final    Comment:        The GeneXpert MRSA Assay (FDA approved for NASAL specimens only), is one component of a comprehensive MRSA colonization surveillance program. It is not intended to diagnose MRSA infection nor to guide or monitor treatment for MRSA infections.   C difficile quick scan w PCR reflex     Status: None   Collection Time: 07/20/17  4:35 AM  Result Value Ref Range Status   C Diff antigen NEGATIVE NEGATIVE Final   C Diff toxin NEGATIVE NEGATIVE Final   C Diff interpretation No C. difficile detected.  Final  Culture, respiratory (NON-Expectorated)     Status: None   Collection Time: 07/21/17 11:10  AM  Result Value Ref Range Status   Specimen Description TRACHEAL ASPIRATE  Final   Special  Requests NONE  Final   Gram Stain   Final    FEW WBC PRESENT,BOTH PMN AND MONONUCLEAR NO SQUAMOUS EPITHELIAL CELLS SEEN NO ORGANISMS SEEN    Culture   Final    NO GROWTH 2 DAYS Performed at [facility], [address] [place], [place] [address]    Report Status 07/23/2017 FINAL  Final    Coagulation Studies: Recent Labs    07/24/17 0445  LABPROT 16.5*  INR 1.34    Urinalysis: No results for input(s): COLORURINE, LABSPEC, PHURINE, GLUCOSEU, HGBUR, BILIRUBINUR, KETONESUR, PROTEINUR, UROBILINOGEN, NITRITE, LEUKOCYTESUR in the last 72 hours.  Invalid input(s): APPERANCEUR    Imaging: Dg Chest Port 1 View  Result Date: 07/25/2017 CLINICAL DATA:  Acute respiratory failure EXAM: PORTABLE CHEST 1 VIEW COMPARISON:  07/24/2017 FINDINGS: Endotracheal tube in good position. Central line tip in the SVC. NG tube in the stomach. Left chest tube in place and unchanged. No pneumothorax Improvement in bilateral airspace disease. Mild improvement in bilateral effusions and bibasilar atelectasis. IMPRESSION: Improvement in pulmonary edema and bilateral effusions. Moderate bilateral effusions and bibasilar atelectasis have improved. Support lines remain in good position. Electronically Signed   By: Franchot Gallo M.D.   On: 07/25/2017 09:20   Dg Chest Port 1 View  Result Date: 07/24/2017 CLINICAL DATA:  Respiratory failure EXAM: PORTABLE CHEST 1 VIEW COMPARISON:  Yesterday FINDINGS: Endotracheal tube tip at the clavicular heads. IJ catheter from the left with tip at the upper SVC. There is a nasogastric tube which projects over the heart, also seen on image yesterday. This is likely due to obliquity and possibly left diaphragm elevation, intragastric position was convincing on chest x-ray yesterday. No previous imaging to evaluate for hiatal hernia. There is haziness of the bilateral chest consistent with pleural fluid and atelectasis. Consolidation could be obscured. Kerley lines have likely  improved. No pneumothorax. Cardiomegaly. IMPRESSION: 1. Stable positioning of tubes and central line. 2. Layering effusions and presumed atelectasis. Kerley lines/interstitial edema may have improved since yesterday. Electronically Signed   By: Monte Fantasia M.D.   On: 07/24/2017 06:43     Medications:   . ceFEPime (MAXIPIME) IV Stopped (07/25/17 0030)  . dexmedetomidine 1.2 mcg/kg/hr (07/25/17 0900)  . feeding supplement (VITAL AF 1.2 CAL) 1,000 mL (07/25/17 0900)  . fentaNYL infusion INTRAVENOUS 400 mcg/hr (07/25/17 0900)  . norepinephrine (LEVOPHED) Adult infusion Stopped (07/25/17 0730)  . dialysis replacement fluid (prismasate) 400 mL/hr at 07/25/17 0238  . dialysis replacement fluid (prismasate) 200 mL/hr at 07/25/17 0239  . dialysate (PRISMASATE) 2,000 mL/hr at 07/25/17 0800  . sodium chloride 999 mL/hr at 07/21/17 1740   . chlorhexidine gluconate (MEDLINE KIT)  15 mL Mouth Rinse BID  . Chlorhexidine Gluconate Cloth  6 each Topical Daily  . feeding supplement (PRO-STAT SUGAR FREE 64)  30 mL Per Tube Daily  . folic acid  1 mg Per Tube Daily  . hydrocortisone sod succinate (SOLU-CORTEF) inj  50 mg Intravenous Q6H  . insulin aspart  0-15 Units Subcutaneous Q4H  . mouth rinse  15 mL Mouth Rinse QID  . ranitidine  150 mg Per Tube Daily  . simvastatin  40 mg Per Tube QHS  . sodium chloride flush  10-40 mL Intracatheter Q12H  . thiamine  100 mg Per Tube Daily   acetaminophen (TYLENOL) oral liquid 160 mg/5 mL, albuterol, alteplase, fentaNYL (SUBLIMAZE) injection, heparin,  loperamide, midazolam, sodium chloride, sodium chloride flush  Assessment/ Plan:  1. Acute kidney injury - oliguric, minimal UOP. Cont CRRT D#5 2. Metabolic acidosis resolved no bicarbonate 3. Electrolytes stable   4. Septic shock - coming off pressors 5. Vol overload -marked edema still problematic with large scrotal edema  6. Pulmedema - byCXR,andup20L from admit, needs ^^UF 7. ETOH abuse 8. Asp PNA   9. VDRF  Continues to be stable on CRRT   Will continue to provide support       LOS: 8 , W _0 _1 :15 AM

## 2017-07-25 NOTE — Progress Notes (Signed)
PCCM Interval Note  Follow-up evaluation of patient this afternoon.  Efforts have been made to significantly decrease his sedating medications.  Patient is starting to become more active even on some fentanyl and Precedex.  He tolerated pressure support ventilation.  Decision made to extubate with fentanyl off, Precedex continuing, good tidal volumes.   Some evolving agitation as we have work to titrate his Precedex optimally.  He remains on CVVHD.  If he becomes persistently agitated then I will probably stop the CVVHD to decrease the risk of self injury, bleeding, etc.  Continue to monitor closely.  May need to add some as needed Ativan or Versed.  Independent CC time 34 minutes  Baltazar Apo, MD, PhD 07/25/2017, 5:35 PM [facility] [phone] or if no answer [phone]

## 2017-07-25 NOTE — Progress Notes (Signed)
Pt increasingly more and more agitated throughout the shift. Pt has kicked at this RN. Pt has spit at this RN 3 times. PRN fentanyl given, no change. MD notified. PRN haldol given. No effect to patient. MD notified again. Higher dose of haldol ordered. Pt repeatedly states he wants these "tubes" out and he states he "wants to die." Renal MD paged, no response at this time. Will continue to monitor.

## 2017-07-25 NOTE — Progress Notes (Signed)
eLink Physician-Brief Progress Note Patient Name: Andrew [name] DOB: 02/18/1956 MRN: [id number]   Date of Service  07/25/2017  HPI/Events of Note  Agitated Delirium - Already on a Precedex IV infusion. QTc interval = 0.36 seconds.   eICU Interventions  Will order: 1. Haldol 2 mg IV Q 3 hours PRN agitated delirium.      Intervention Category Major Interventions: Delirium, psychosis, severe agitation - evaluation and management  Sommer,Steven Eugene 07/25/2017, 9:28 PM

## 2017-07-26 ENCOUNTER — Inpatient Hospital Stay (HOSPITAL_COMMUNITY): Payer: Medicare Other

## 2017-07-26 LAB — CBC
HCT: 32.5 % — ABNORMAL LOW (ref 39.0–52.0)
HEMOGLOBIN: 10.5 g/dL — AB (ref 13.0–17.0)
MCH: 28.8 pg (ref 26.0–34.0)
MCHC: 32.3 g/dL (ref 30.0–36.0)
MCV: 89 fL (ref 78.0–100.0)
PLATELETS: 58 10*3/uL — AB (ref 150–400)
RBC: 3.65 MIL/uL — AB (ref 4.22–5.81)
RDW: 17 % — ABNORMAL HIGH (ref 11.5–15.5)
WBC: 10.5 10*3/uL (ref 4.0–10.5)

## 2017-07-26 LAB — RENAL FUNCTION PANEL
ALBUMIN: 2.5 g/dL — AB (ref 3.5–5.0)
ALBUMIN: 2.3 g/dL — AB (ref 3.5–5.0)
ANION GAP: 9 (ref 5–15)
Anion gap: 9 (ref 5–15)
BUN: 25 mg/dL — ABNORMAL HIGH (ref 6–20)
BUN: 28 mg/dL — ABNORMAL HIGH (ref 6–20)
CALCIUM: 8.2 mg/dL — AB (ref 8.9–10.3)
CALCIUM: 8.1 mg/dL — AB (ref 8.9–10.3)
CO2: 26 mmol/L (ref 22–32)
CO2: 23 mmol/L (ref 22–32)
CREATININE: 1.13 mg/dL (ref 0.61–1.24)
CREATININE: 1.29 mg/dL — AB (ref 0.61–1.24)
Chloride: 104 mmol/L (ref 101–111)
Chloride: 109 mmol/L (ref 101–111)
GFR calc non Af Amer: 59 mL/min — ABNORMAL LOW (ref >60)
Glucose, Bld: 134 mg/dL — ABNORMAL HIGH (ref 65–99)
Glucose, Bld: 125 mg/dL — ABNORMAL HIGH (ref 65–99)
PHOSPHORUS: 1.8 mg/dL — AB (ref 2.5–4.6)
PHOSPHORUS: 2.2 mg/dL — AB (ref 2.5–4.6)
Potassium: 3.7 mmol/L (ref 3.5–5.1)
Potassium: 3.6 mmol/L (ref 3.5–5.1)
SODIUM: 139 mmol/L (ref 135–145)
SODIUM: 141 mmol/L (ref 135–145)

## 2017-07-26 LAB — GLUCOSE, CAPILLARY
GLUCOSE-CAPILLARY: 114 mg/dL — AB (ref 65–99)
GLUCOSE-CAPILLARY: 124 mg/dL — AB (ref 65–99)
Glucose-Capillary: 132 mg/dL — ABNORMAL HIGH (ref 65–99)
Glucose-Capillary: 105 mg/dL — ABNORMAL HIGH (ref 65–99)
Glucose-Capillary: 107 mg/dL — ABNORMAL HIGH (ref 65–99)
Glucose-Capillary: 81 mg/dL (ref 65–99)

## 2017-07-26 MED ORDER — POTASSIUM PHOSPHATES 15 MMOLE/5ML IV SOLN
20.0000 meq | Freq: Once | INTRAVENOUS | Status: AC
  Administered 2017-07-26: 20 meq via INTRAVENOUS
  Filled 2017-07-26: qty 4.55

## 2017-07-26 MED ORDER — MIDAZOLAM HCL 2 MG/2ML IJ SOLN
1.0000 mg | INTRAMUSCULAR | Status: DC | PRN
  Administered 2017-07-26 (×9): 2 mg via INTRAVENOUS
  Filled 2017-07-26 (×8): qty 2

## 2017-07-26 NOTE — Progress Notes (Signed)
PULMONARY  / CRITICAL CARE MEDICINE  Name: Andrew [name] MRN: [id number] DOB: 09-06-1955    LOS: 99  REFERRING MD :  Dr. Tomi Bamberger  CHIEF COMPLAINT:  Shortness of Breath  BRIEF PATIENT DESCRIPTION:  61 yo male smoker presented to [facility] ER 12/10 with ETOH withdrawal.  Required intubation for aspiration pneumonia.  Prolonged ICU course due to agitated delirium.   PMHx of PAD, HTN, HLD, GERD, DM.   SUBJECTIVE:  RN reports pt spitting at staff.  Agitated overnight.  Required 500 mcg fentanyl, haldol x3 doses, versed x4 and precedex is at ceiling dose.  Bair hugger continues (temp 96).  Did not sleep overnight.     VITAL SIGNS: Temp:  [95.4 F (35.2 C)-98.2 F (36.8 C)] 98.1 F (36.7 C) (12/19 0900) Pulse Rate:  [60-193] 108 (12/19 0900) Resp:  [10-21] 20 (12/19 0900) BP: (108-162)/(65-125) 147/85 (12/19 0900) SpO2:  [96 %-100 %] 100 % (12/19 0900) FiO2 (%):  [35 %] 35 % (12/18 1237) Weight:  [167 lb 12.3 oz (76.1 kg)] 167 lb 12.3 oz (76.1 kg) (12/19 0500)   VENTILATOR SETTINGS: Vent Mode: PRVC FiO2 (%):  [35 %] 35 % Set Rate:  [14 bmp] 14 bmp Vt Set:  [620 mL] 620 mL PEEP:  [5 cmH20] 5 cmH20 Plateau Pressure:  [16 cmH20] 16 cmH20   INTAKE / OUTPUT: I/O last 3 completed shifts: In: 4087.3 [I.V.:1862.3; Other:420; JO/AC:1660; IV Piggyback:320] Out: 63016 [Urine:900; WFUXN:2355]  PHYSICAL EXAMINATION: General: chronically ill adult male in NAD HEENT: MM pink/moist, L IJ HD cath  PSY:  Agitated, yells out profanity intermittently  Neuro: Awake, alert, oriented to self, MAE CV: s1s2 rrr, no m/r/g PULM: even/non-labored, lungs bilaterally clear, diminished lower  GI: soft, non-tender, bsx4 active  Extremities: warm/dry, no edema, LLE BKA   Skin: no rashes or lesions  LABS: Cbc Recent Labs  Lab 07/24/17 0445 07/25/17 0503 07/26/17 0418  WBC 6.9 7.8 10.5  HGB 10.4* 11.0* 10.5*  HCT 32.8* 35.3* 32.5*  PLT 37* 52* 58*    Chemistry  Recent Labs  Lab 07/22/17 0527   07/23/17 0500  07/24/17 0445  07/25/17 0503 07/25/17 1724 07/26/17 0418  NA  --    < > 139  140   < > 137   < > 138 138 139  K  --    < > 3.5  3.6   < > 4.0   < > 3.8 3.7 3.7  CL  --    < > 108  108   < > 105   < > 105 106 104  CO2  --    < > 24  24   < > 27   < > 26 23 26   BUN  --    < > 29*  29*   < > 21*   < > 23* 24* 25*  CREATININE  --    < > 2.57*  2.60*   < > 1.67*   < > 1.26* 1.24 1.13  CALCIUM  --    < > 7.3*  7.2*   < > 7.6*   < > 7.9* 7.4* 8.2*  MG 1.9  --  1.9  --  2.1  --   --   --   --   PHOS  --    < > 2.1*   < > 1.5*   < > 2.2* 2.0* 1.8*  GLUCOSE  --    < > 149*  148*   < >  204*   < > 181* 205* 134*   < > = values in this interval not displayed.    Liver fxn Recent Labs  Lab 07/25/17 0503 07/25/17 1724 07/26/17 0418  ALBUMIN 2.2* 2.1* 2.5*   coags Recent Labs  Lab 07/19/17 1018 07/22/17 0527 07/23/17 0500 07/24/17 0445  APTT 50* 38* 59* 36  INR 2.64  --   --  1.34   Sepsis markers Recent Labs  Lab 07/19/17 1130 07/20/17 0826 07/20/17 0940 07/20/17 1203 07/21/17 0313 07/22/17 0527  LATICACIDVEN 2.4* 3.7*  --  2.6*  --   --   PROCALCITON  --   --  38.49  --  51.60 32.93   Cardiac markers No results for input(s): CKTOTAL, CKMB, TROPONINI in the last 168 hours.   BNP No results for input(s): PROBNP in the last 168 hours.   ABG No results for input(s): PHART, PCO2ART, PO2ART, HCO3, TCO2 in the last 168 hours.  CBG trend Recent Labs  Lab 07/25/17 1553 07/25/17 1930 07/25/17 2344 07/26/17 0315 07/26/17 0734  GLUCAP 179* 198* 158* 132* 105*    IMAGING: Dg Chest Port 1 View  Result Date: 07/26/2017 CLINICAL DATA:  Acute respiratory failure EXAM: PORTABLE CHEST 1 VIEW COMPARISON:  07/25/2017 FINDINGS: Endotracheal tube removed. Central venous catheter tip in the SVC unchanged. Gastric tube removed. Left chest tube removed. Bilateral airspace disease, probable edema, is similar. Bibasilar atelectasis and effusion similar.  IMPRESSION: Endotracheal tube removed. Left chest tube removed. No pneumothorax. Bilateral airspace disease unchanged, probable edema. Bibasilar atelectasis and effusion unchanged. Electronically Signed   By: Franchot Gallo M.D.   On: 07/26/2017 06:36   Dg Chest Port 1 View  Result Date: 07/25/2017 CLINICAL DATA:  Acute respiratory failure EXAM: PORTABLE CHEST 1 VIEW COMPARISON:  07/24/2017 FINDINGS: Endotracheal tube in good position. Central line tip in the SVC. NG tube in the stomach. Left chest tube in place and unchanged. No pneumothorax Improvement in bilateral airspace disease. Mild improvement in bilateral effusions and bibasilar atelectasis. IMPRESSION: Improvement in pulmonary edema and bilateral effusions. Moderate bilateral effusions and bibasilar atelectasis have improved. Support lines remain in good position. Electronically Signed   By: Franchot Gallo M.D.   On: 07/25/2017 09:20    LINES / TUBES: ETT 12/10 >> Lt IJ HD cath 12/15 >>  CULTURES: Sputum 12/10 >> oral flora Blood 12/10 >> negative Pneumococcal ag 12/10 >> negative Legionella ag 12/10 >> negative C diff 12/13 >> negative Sputum 12/14 >> negative  ANTIBIOTICS: Unasyn 12/10 >> 12/14 maxipime 12/14 >> 12/19 Vanc 12/14 >> 12/16  STUDIES: Renal u/s 12/13 >> small amount of ascites, normal kidneys  SIGNIFICANT EVENTS:  12/13 noted to have diarrhea bloody stools with a negative C. difficile 12/14 started CVVHD 12/18 UOP 462ml, volume status improved, levo at 3mcg, high sedation > turned off extubated  12/19 More alert, remains on 2.4 precedex   DISCUSSION: 61 yo with ETOH withdrawal, aspiration pneumonia, VDRF complicated by acute renal failure and septic shock.  UDS positive for THC. Prolonged ICU stay due to agitated delirium / high sedation needs.      ASSESSMENT / PLAN:  Acute respiratory failure with hypoxia secondary to aspiration pneumonia and acute pulmonary edema. Tobacco abuse. Bilateral pleural  effusions, likely related to overall volume status P: Pulmonary hygiene  O2 for sats 88-95% Mobilize  Discontinue abx, D10   Septic shock 2nd to pneumonia. Acute pulmonary edema. Diffuse cardiac hypokinesis, EF 20-25%, question due to sepsis Hx of HTN, PACs,  PAD. P: Monitor off levo  Discontinue stress steroids  Will need ischemic work up once he is stabilized from respiratory standpoint   Acute renal failure from hypovolemia and ATN. Total body volume overload Hypophosphatemia Hypokalemia P: Appreciate Nephrology, planned to to stop CVVHD 12/19 Trend BMP / urinary output Replace electrolytes as indicated, Kphos 12/19  Avoid nephrotoxic agents, ensure adequate renal perfusion  Moderate protein calorie malnutrition. At Risk Aspiration  P: May need small bore feeding tube if not able to take PO's NPO x ice chips  SLP evaluation once mental status improved   Anemia of critical illness and chronic disease. Thrombocytopenia 2nd to sepsis and hx of ETOH.  HIT negative 12/12.  P: Trend CBC  Transfuse per ICU guidelines   DM type II. P: SSI   Acute metabolic encephalopathy. Acute alcohol withdrawal. Sedation for mechanical ventilation P: PT consult  Mobilize  Precedex ceiling 2.4  Attempt to minimize other sedating medications  Continue thiamine, folate   Family: Dr Lamonte Sakai called brother Andrew [name] 07/24/17, left message.   DVT prophylaxis - SCDs SUP - protonix Nutrition - NPO Goals of care - Full code  Noe Gens, NP-C [facility] Pgr: [phone] or if no answer [phone] 07/26/2017, 9:59 AM

## 2017-07-26 NOTE — Progress Notes (Signed)
[facility] ROUNDING NOTE   Subjective:   Interval History: Urine output appears to have improved and he continues to be agitated   He has had some safety concerns with his CRRT and he has been extubated  61 y.o.year-old w/ hx of DM, HL, etoh abuse, HTN, HL PVD who presented with tremors on 07/17/17 and hx etoh abuse. He decompensated and required intubation and was admitted to the ICU. Admit creatinine was 0.73Baseline  Renal US showed 11-12 cm kidneys, no hydro, normal cortex, normal echotexture     Objective:  Vital signs in last 24 hours:  Temp:  [95.4 F (35.2 C)-98.2 F (36.8 C)] 97.9 F (36.6 C) (12/19 0800) Pulse Rate:  [60-193] 107 (12/19 0800) Resp:  [10-21] 19 (12/19 0800) BP: (108-162)/(65-125) 120/96 (12/19 0800) SpO2:  [96 %-100 %] 98 % (12/19 0800) FiO2 (%):  [35 %] 35 % (12/18 1237) Weight:  [167 lb 12.3 oz (76.1 kg)] 167 lb 12.3 oz (76.1 kg) (12/19 0500)  Weight change: -3.6 oz (-6 kg) Filed Weights   07/24/17 0500 07/25/17 0500 07/26/17 0500  Weight: 193 lb 12.6 oz (87.9 kg) 180 lb 16 oz (82.1 kg) 167 lb 12.3 oz (76.1 kg)    Intake/Output: I/O last 3 completed shifts: In: 4087.3 [I.V.:1862.3; Other:420; DJ/ME:2683; IV Piggyback:320] Out: 41962 [Urine:900; IWLNL:8921]   Intake/Output this shift:  Total I/O In: 115.2 [I.V.:95.2; Other:20] Out: 591 [Urine:90; Other:501]   Genon the vent sedated Sclera anicteric, throat No jvd or bruits Chest clear bilat RRR no MRG Abd soft ntnd no mass or ascites +bs GU normal male decreased edema Neuro is sedated, on the vent     Basic Metabolic Panel: Recent Labs  Lab 07/21/17 0313 07/22/17 0527  07/23/17 0500  07/24/17 0445 07/24/17 1638 07/25/17 0503 07/25/17 1724 07/26/17 0418  NA 146*  --    < > 139  140   < > 137 137 138 138 139  K 4.9  --    < > 3.5  3.6   < > 4.0 4.0 3.8 3.7 3.7  CL 120*  --    < > 108  108   < > 105 104 105 106 104  CO2 14*  --    < > 24  24    < > _0 GLUCOSE 138*  --    < > 149*  148*   < > 204* 217* 181* 205* 134*  BUN 62*  --    < > 29*  29*   < > 21* 20 23* 24* 25*  CREATININE 5.83*  --    < > 2.57*  2.60*   < > 1.67* 1.43* 1.26* 1.24 1.13  CALCIUM 7.0*  --    < > 7.3*  7.2*   < > 7.6* 7.7* 7.9* 7.4* 8.2*  MG 1.9 1.9  --  1.9  --  2.1  --   --   --   --   PHOS 6.0*  --    < > 2.1*   < > 1.5* 1.6* 2.2* 2.0* 1.8*   < > = values in this interval not displayed.    Liver Function Tests: Recent Labs  Lab 07/24/17 0445 07/24/17 1638 07/25/17 0503 07/25/17 1724 07/26/17 0418  ALBUMIN 2.1* 2.1* 2.2* 2.1* 2.5*   No results for input(s): LIPASE, AMYLASE in the last 168 hours. No results for input(s): AMMONIA in the last 168 hours.  CBC: Recent Labs  Lab 07/21/17  7209 07/22/17 0527 07/23/17 0500 07/24/17 0445 07/25/17 0503 07/26/17 0418  WBC 11.2* 5.5 5.7 6.9 7.8 10.5  NEUTROABS 8.5*  --  4.6  --   --   --   HGB 11.6* 10.3* 9.5* 10.4* 11.0* 10.5*  HCT 38.4* 33.4* 29.6* 32.8* 35.3* 32.5*  MCV 95.3 93.3 90.5 90.4 91.0 89.0  PLT 35* 27* 22* 37* 52* 58*    Cardiac Enzymes: No results for input(s): CKTOTAL, CKMB, CKMBINDEX, TROPONINI in the last 168 hours.  BNP: Invalid input(s): POCBNP  CBG: Recent Labs  Lab 07/25/17 1553 07/25/17 1930 07/25/17 2344 07/26/17 0315 07/26/17 0734  GLUCAP 179* 198* 158* 132* 105*    Microbiology: Results for orders placed or performed during the hospital encounter of 07/17/17  Culture, blood (Routine X 2) w Reflex to ID Panel     Status: None   Collection Time: 07/17/17  1:39 PM  Result Value Ref Range Status   Specimen Description BLOOD LEFT ANTECUBITAL  Final   Special Requests IN PEDIATRIC BOTTLE Blood Culture adequate volume  Final   Culture   Final    NO GROWTH 5 DAYS Performed at [facility], [place] [address] [place], [place] [address]    Report Status 07/22/2017 FINAL  Final  Culture, blood (Routine X 2) w Reflex to ID Panel     Status:  None   Collection Time: 07/17/17  1:39 PM  Result Value Ref Range Status   Specimen Description BLOOD LEFT ARM  Final   Special Requests   Final    BOTTLES DRAWN AEROBIC AND ANAEROBIC Blood Culture adequate volume   Culture   Final    NO GROWTH 5 DAYS Performed at [facility], [place] [address], [place], [place] [address]    Report Status 07/22/2017 FINAL  Final  Culture, respiratory (NON-Expectorated)     Status: None   Collection Time: 07/17/17  5:40 PM  Result Value Ref Range Status   Specimen Description TRACHEAL ASPIRATE  Final   Special Requests NONE  Final   Gram Stain   Final    ABUNDANT WBC PRESENT,BOTH PMN AND MONONUCLEAR FEW GRAM NEGATIVE RODS FEW GRAM POSITIVE COCCI FEW SQUAMOUS EPITHELIAL CELLS PRESENT    Culture   Final    Consistent with normal respiratory flora. Performed at [facility], [place] [address] [place], [place] [address]    Report Status 07/20/2017 FINAL  Final  MRSA PCR Screening     Status: None   Collection Time: 07/19/17  9:34 AM  Result Value Ref Range Status   MRSA by PCR NEGATIVE NEGATIVE Final    Comment:        The GeneXpert MRSA Assay (FDA approved for NASAL specimens only), is one component of a comprehensive MRSA colonization surveillance program. It is not intended to diagnose MRSA infection nor to guide or monitor treatment for MRSA infections.   C difficile quick scan w PCR reflex     Status: None   Collection Time: 07/20/17  4:35 AM  Result Value Ref Range Status   C Diff antigen NEGATIVE NEGATIVE Final   C Diff toxin NEGATIVE NEGATIVE Final   C Diff interpretation No C. difficile detected.  Final  Culture, respiratory (NON-Expectorated)     Status: None   Collection Time: 07/21/17 11:10 AM  Result Value Ref Range Status   Specimen Description TRACHEAL ASPIRATE  Final   Special Requests NONE  Final   Gram Stain   Final  FEW WBC PRESENT,BOTH PMN AND MONONUCLEAR NO SQUAMOUS EPITHELIAL CELLS SEEN NO ORGANISMS  SEEN    Culture   Final    NO GROWTH 2 DAYS Performed at [facility], [place] [address] [place], [place] [address]    Report Status 07/23/2017 FINAL  Final    Coagulation Studies: Recent Labs    07/24/17 0445  LABPROT 16.5*  INR 1.34    Urinalysis: No results for input(s): COLORURINE, LABSPEC, PHURINE, GLUCOSEU, HGBUR, BILIRUBINUR, KETONESUR, PROTEINUR, UROBILINOGEN, NITRITE, LEUKOCYTESUR in the last 72 hours.  Invalid input(s): APPERANCEUR    Imaging: Dg Chest Port 1 View  Result Date: 07/26/2017 CLINICAL DATA:  Acute respiratory failure EXAM: PORTABLE CHEST 1 VIEW COMPARISON:  07/25/2017 FINDINGS: Endotracheal tube removed. Central venous catheter tip in the SVC unchanged. Gastric tube removed. Left chest tube removed. Bilateral airspace disease, probable edema, is similar. Bibasilar atelectasis and effusion similar. IMPRESSION: Endotracheal tube removed. Left chest tube removed. No pneumothorax. Bilateral airspace disease unchanged, probable edema. Bibasilar atelectasis and effusion unchanged. Electronically Signed   By: Franchot Gallo M.D.   On: 07/26/2017 06:36   Dg Chest Port 1 View  Result Date: 07/25/2017 CLINICAL DATA:  Acute respiratory failure EXAM: PORTABLE CHEST 1 VIEW COMPARISON:  07/24/2017 FINDINGS: Endotracheal tube in good position. Central line tip in the SVC. NG tube in the stomach. Left chest tube in place and unchanged. No pneumothorax Improvement in bilateral airspace disease. Mild improvement in bilateral effusions and bibasilar atelectasis. IMPRESSION: Improvement in pulmonary edema and bilateral effusions. Moderate bilateral effusions and bibasilar atelectasis have improved. Support lines remain in good position. Electronically Signed   By: Franchot Gallo M.D.   On: 07/25/2017 09:20     Medications:   . ceFEPime (MAXIPIME) IV Stopped (07/26/17 0115)  . dexmedetomidine 2.4 mcg/kg/hr (07/26/17 0900)  . feeding supplement (VITAL AF 1.2 CAL) Stopped  (07/25/17 2300)  . fentaNYL infusion INTRAVENOUS Stopped (07/25/17 1700)  . norepinephrine (LEVOPHED) Adult infusion Stopped (07/25/17 1700)  . potassium PHOSPHATE IVPB (mEq)    . dialysis replacement fluid (prismasate) 400 mL/hr at 07/26/17 0559  . dialysis replacement fluid (prismasate) 200 mL/hr at 07/26/17 0555  . dialysate (PRISMASATE) 2,000 mL/hr at 07/26/17 0900  . sodium chloride 999 mL/hr at 07/21/17 1740   . chlorhexidine gluconate (MEDLINE KIT)  15 mL Mouth Rinse BID  . Chlorhexidine Gluconate Cloth  6 each Topical Daily  . feeding supplement (PRO-STAT SUGAR FREE 64)  30 mL Per Tube Daily  . folic acid  1 mg Per Tube Daily  . hydrocortisone sod succinate (SOLU-CORTEF) inj  50 mg Intravenous Q6H  . insulin aspart  0-15 Units Subcutaneous Q4H  . mouth rinse  15 mL Mouth Rinse QID  . ranitidine  150 mg Per Tube Daily  . simvastatin  40 mg Per Tube QHS  . sodium chloride flush  10-40 mL Intracatheter Q12H  . thiamine  100 mg Per Tube Daily   acetaminophen (TYLENOL) oral liquid 160 mg/5 mL, albuterol, alteplase, fentaNYL (SUBLIMAZE) injection, haloperidol lactate, heparin, loperamide, midazolam, sodium chloride, sodium chloride flush  Assessment/ Plan:  1. Acute kidney injury - improved urine output  CRRT D#6  Will stop 2. Metabolic acidosis resolved no bicarbonate 3. Electrolytes stable 4. Septic shock -  off pressors 5. Vol overload -marked edemastill problematic with large scrotal edema 6. Pulmedema - resolved 7. ETOH abuse   delirium 8. Asp PNA  9. VDRF         LOS: 9 Rayneisha Bouza W _0 _1 :32  AM

## 2017-07-26 NOTE — Progress Notes (Signed)
PT Cancellation Note  Patient Details Name: Andrew [name] MRN: [id number] DOB: 1955/10/21   Cancelled Treatment:    Reason Eval/Treat Not Completed: Pt continues to be agitated and combative. Will hold PT for now and check back another day.   Weston Anna, MPT Pager: [phone]

## 2017-07-26 NOTE — Progress Notes (Signed)
eLink Physician-Brief Progress Note Patient Name: Andrew [name] DOB: [birth year] MRN: [id number]   Date of Service  07/26/2017  HPI/Events of Note  Delirium - Severe agitation again. Versed appears to be the only medications that improves his agitation.   eICU Interventions  Will order: 1. Increase Versed IV dose to Q 2 hours.      Intervention Category Major Interventions: Delirium, psychosis, severe agitation - evaluation and management  Kallyn Demarcus Eugene 07/26/2017, 1:45 AM

## 2017-07-26 NOTE — Progress Notes (Signed)
Removed pt's mask, gave ice chips, pt tolerated well. Advised pt he could not spit at staff, he agreed. However, upon beginning his bath patient became increasingly agitated. He started cussing and trying to kick staff, as well as spitting. Placed another mask on pt, bilateral wrist restraints continued, and 2mg  Versed given. Will continue to monitor.

## 2017-07-26 NOTE — Progress Notes (Signed)
Nutrition Follow-up  DOCUMENTATION CODES:   Not applicable  INTERVENTION:  - If diet unable to be advanced, recommend Panda tube with Vital 1.5 @ 55 mL/hr with 30 mL Prostat BID. This regimen will provide 2180 kcal, 119 grams of protein, and 1008 mL free water.    NUTRITION DIAGNOSIS:   Inadequate oral intake related to inability to eat as evidenced by NPO status. -ongoing  GOAL:   Patient will meet greater than or equal to 90% of their needs -unmet at this time  MONITOR:   Diet advancement, Weight trends, Labs, Other (Comment)(Any plan concerning nutrition)  ASSESSMENT:   Pt with PMH significant for alcohol abuse DM, HLD, HTN, PAD, squamous cell cancer of the lip, and tobacco abuse. Presents this admission with alcohol withdrawal and aspiration pneumonia with resulting acute respiratory failure.   12/19 Pt was extubated ~5 PM yesterday and OGT removed at that time. Pt continues with moderate edema to all extremities and remains on CRRT. Weight trending down and is -26 lbs/11.8 kg since previous RD assessment. Will monitor for ability for diet to be advanced versus need to [facility] placement and TF. Estimated nutrition needs updated based on CRRT.   Medications reviewed; 1 mg folic acid/day, 50 mg Solu-cortef QID, sliding scale Novolog, 20 mEq IV KPhos x1 run today, 100 mg thiamine/day.  Labs reviewed; CBGs: 132 and 105 mg/dL this AM, Ca: 8.2 mg/dL, BUN: 25 mg/dL, Phos: 1.8 mg/dL.     12/17 - Pt remains intubated with OGT in place.  - No BM since 12/13 and RN reports plan to start bowel regimen d/t this.  - He was started on CRRT on 12/14 and plan to continue.  - Continue to use weight from 12/11 (72.4 kg) to re-estimate needs given moderate edema to all extremities and weight +15.5 kg since that time.  - Per Dr. Agustina Caroli note this AM, plan to work on decreasing sedation today.  - Pt currently receiving Vital 1.5 @60  mL/hr which is providing 2160 kcal, 97 grams of protein, and  1100 mL free water.   Patient is currently intubated on ventilator support MV: 9.1 L/min Temp (24hrs), Avg:97.8 F (36.6 C), Min:95.2 F (35.1 C), Max:98.8 F (37.1 C) BP: 94/63 and MAP: 72 Drips: Precedex @ 1.2 mcg/kg/hr, Fentanyl @ 300 mcg/hr, Levo @ 1 mcg/min.      12/13 - Current order for Vital 1.5 @ 55 mL/hr which provides 1980 kcal, 89 grams of protein, and 1008 mL free water.  - RN turned TF off around 10:30 AM in preparation for renal ultrasound today.  - She reports that diarrhea began yesterday/overnight.  - C. Diff was negative and PCCM NP note states plan to start imodium.  - Weight + 21 lbs/9.3 kg compared to weight on 12/11.  Patient is currently intubated on ventilator support MV:14L/min Temp (24hrs), Avg:99.5 F (37.5 C), Min:98.1 F (36.7 C), Max:102 F (38.9 C) BP: 95/68 and MAP: 75 Labs; Na: 148 mmol/L, Cl: 120 mmol/L IVF: D5 @ 50 mL/hr (204 kcal) Drips: Precedex @ 1 mcg/kg/hr, Fentanyl @ 75 mcg/hr, Neo @ 30 mcg/min.       Diet Order:  Diet NPO time specified  EDUCATION NEEDS:   Not appropriate for education at this time  Skin:  Skin Assessment: Reviewed RN Assessment  Last BM:  12/18  Height:   Ht Readings from Last 1 Encounters:  07/17/17 6' (1.829 m)    Weight:   Wt Readings from Last 1 Encounters:  07/26/17 167 lb  12.3 oz (76.1 kg)    Ideal Body Weight:  80.91 kg  BMI:  Body mass index is 22.75 kg/m.  Estimated Nutritional Needs:   Kcal:  7616-0737 (28-32 kcal/kg)  Protein:  115-130 grams (1.5-1.7 grams/kg)  Fluid:  >1.5 L/day     Jarome Matin, MS, RD, LDN, [facility] Clinical Dietitian Pager # [phone] After hours/weekend pager # [phone]

## 2017-07-27 LAB — GLUCOSE, CAPILLARY
GLUCOSE-CAPILLARY: 75 mg/dL (ref 65–99)
GLUCOSE-CAPILLARY: 87 mg/dL (ref 65–99)
GLUCOSE-CAPILLARY: 90 mg/dL (ref 65–99)
Glucose-Capillary: 69 mg/dL (ref 65–99)
Glucose-Capillary: 91 mg/dL (ref 65–99)

## 2017-07-27 LAB — RENAL FUNCTION PANEL
Albumin: 2.1 g/dL — ABNORMAL LOW (ref 3.5–5.0)
Anion gap: 6 (ref 5–15)
BUN: 33 mg/dL — ABNORMAL HIGH (ref 6–20)
CALCIUM: 8.3 mg/dL — AB (ref 8.9–10.3)
CHLORIDE: 109 mmol/L (ref 101–111)
CO2: 26 mmol/L (ref 22–32)
CREATININE: 1.61 mg/dL — AB (ref 0.61–1.24)
GFR calc non Af Amer: 45 mL/min — ABNORMAL LOW (ref >60)
GFR, EST AFRICAN AMERICAN: 52 mL/min — AB (ref >60)
GLUCOSE: 84 mg/dL (ref 65–99)
Phosphorus: 2.6 mg/dL (ref 2.5–4.6)
Potassium: 3.2 mmol/L — ABNORMAL LOW (ref 3.5–5.1)
SODIUM: 141 mmol/L (ref 135–145)

## 2017-07-27 LAB — CBC
HCT: 28.2 % — ABNORMAL LOW (ref 39.0–52.0)
Hemoglobin: 9.3 g/dL — ABNORMAL LOW (ref 13.0–17.0)
MCH: 28.9 pg (ref 26.0–34.0)
MCHC: 33 g/dL (ref 30.0–36.0)
MCV: 87.6 fL (ref 78.0–100.0)
PLATELETS: 65 10*3/uL — AB (ref 150–400)
RBC: 3.22 MIL/uL — AB (ref 4.22–5.81)
RDW: 17.5 % — ABNORMAL HIGH (ref 11.5–15.5)
WBC: 12.3 10*3/uL — ABNORMAL HIGH (ref 4.0–10.5)

## 2017-07-27 MED ORDER — MIDAZOLAM HCL 2 MG/2ML IJ SOLN: 1.0000 mg | INTRAMUSCULAR | Status: DC | PRN

## 2017-07-27 MED ORDER — LORAZEPAM 2 MG/ML IJ SOLN
1.0000 mg | Freq: Four times a day (QID) | INTRAMUSCULAR | Status: DC
  Administered 2017-07-27 – 2017-07-31 (×16): 1 mg via INTRAVENOUS
  Filled 2017-07-27 (×16): qty 1

## 2017-07-27 MED ORDER — DEXTROSE 50 % IV SOLN
25.0000 mL | Freq: Once | INTRAVENOUS | Status: AC
  Administered 2017-07-27: 25 mL via INTRAVENOUS

## 2017-07-27 MED ORDER — LORAZEPAM 2 MG/ML IJ SOLN
2.0000 mg | INTRAMUSCULAR | Status: DC | PRN
  Administered 2017-07-27: 4 mg via INTRAVENOUS
  Administered 2017-07-27: 2 mg via INTRAVENOUS
  Filled 2017-07-27 (×2): qty 2

## 2017-07-27 MED ORDER — DEXTROSE 50 % IV SOLN
INTRAVENOUS | Status: AC
  Filled 2017-07-27: qty 50

## 2017-07-27 MED ORDER — ACETAMINOPHEN 650 MG RE SUPP
650.0000 mg | Freq: Four times a day (QID) | RECTAL | Status: AC | PRN
  Administered 2017-07-27 – 2017-07-28 (×2): 650 mg via RECTAL
  Filled 2017-07-27 (×2): qty 1

## 2017-07-27 NOTE — Progress Notes (Signed)
PT Cancellation Note  Patient Details Name: Andrew [name] MRN: [id number] DOB: [birth year]   Cancelled Treatment:     PT deferred this date - RN advises pt very lethargic.  Will follow.   Jinna Weinman 07/27/2017, 11:31 AM

## 2017-07-27 NOTE — Progress Notes (Signed)
Admit: 07/17/2017 LOS: 32  32M with AKI, req CRRT stopped 12/19, ETOH withdrawal with VDRF and aspiration PNA  Subjective:  Good UOP yesterday, 1.5L Not on pressors   12/19 0701 - 12/20 0700 In: 1114.8 [I.V.:1094.8] Out: 1991 [JJHER:7408]  Filed Weights   07/25/17 0500 07/26/17 0500 07/27/17 0500  Weight: 82.1 kg (180 lb 16 oz) 76.1 kg (167 lb 12.3 oz) 72.3 kg (159 lb 6.3 oz)    Scheduled Meds: . chlorhexidine gluconate (MEDLINE KIT)  15 mL Mouth Rinse BID  . Chlorhexidine Gluconate Cloth  6 each Topical Daily  . feeding supplement (PRO-STAT SUGAR FREE 64)  30 mL Per Tube Daily  . insulin aspart  0-15 Units Subcutaneous Q4H  . LORazepam  1 mg Intravenous QID  . mouth rinse  15 mL Mouth Rinse QID  . ranitidine  150 mg Per Tube Daily  . simvastatin  40 mg Per Tube QHS  . sodium chloride flush  10-40 mL Intracatheter Q12H   Continuous Infusions: . dexmedetomidine 2.4 mcg/kg/hr (07/27/17 1045)  . feeding supplement (VITAL AF 1.2 CAL) Stopped (07/25/17 2300)   PRN Meds:.acetaminophen, albuterol, haloperidol lactate, loperamide, midazolam, sodium chloride flush  Current Labs: reviewed    Physical Exam:  Blood pressure (!) 151/75, pulse 68, temperature 99.5 F (37.5 C), resp. rate (!) 22, height 6' (1.829 m), weight 72.3 kg (159 lb 6.3 oz), SpO2 100 %. Sedated, doesn't respond to verbal stimuli Tachy, regular Coarse b/l S/nt/nd, +BS No LEE   A 1. Nonoliguric AKI, ?recovering, was on CRRT 12/14-12/19; likely normal baseline GFR; neg renal US 2. Aspiration PNA with VDRF off ABX; extubated 3. Septic Shock 4. Alcohol Abuse with withdrawal delirium 5. Reduced LVEF 6. Hx/o HTN  P 1. Will continue to follow, hopefuly is recovering 2. Daily weights, Daily Renal Panel, Strict I/Os, Avoid nephrotoxins (NSAIDs, judicious IV Contrast)    Pearson Grippe MD 07/27/2017, 12:20 PM  Recent Labs  Lab 07/26/17 0418 07/26/17 1543 07/27/17 0337  NA 139 141 141  K 3.7 3.6 3.2*   CL 104 109 109  CO2 _0 GLUCOSE 134* 125* 84  BUN 25* 28* 33*  CREATININE 1.13 1.29* 1.61*  CALCIUM 8.2* 8.1* 8.3*  PHOS 1.8* 2.2* 2.6   Recent Labs  Lab 07/21/17 0313  07/23/17 0500  07/25/17 0503 07/26/17 0418 07/27/17 0337  WBC 11.2*   < > 5.7   < > 7.8 10.5 12.3*  NEUTROABS 8.5*  --  4.6  --   --   --   --   HGB 11.6*   < > 9.5*   < > 11.0* 10.5* 9.3*  HCT 38.4*   < > 29.6*   < > 35.3* 32.5* 28.2*  MCV 95.3   < > 90.5   < > 91.0 89.0 87.6  PLT 35*   < > 22*   < > 52* 58* 65*   < > = values in this interval not displayed.

## 2017-07-27 NOTE — Progress Notes (Signed)
PULMONARY  / CRITICAL CARE MEDICINE  Name: Andrew [name] MRN: [id number] DOB: 09-28-1955    LOS: 44  REFERRING MD :  Dr. Tomi Bamberger  CHIEF COMPLAINT:  Shortness of Breath  BRIEF PATIENT DESCRIPTION:  61 yo male smoker presented to [facility] ER 12/10 with ETOH withdrawal.  Required intubation for aspiration pneumonia.  Prolonged ICU course due to agitated delirium, alcohol withdrawal.   PMHx of PAD, HTN, HLD, GERD, DM.   SUBJECTIVE:  Has required same dose Precedex last 24 hours, remains encephalopathic Improving urine output off of CVVHD Hemodynamically stable   VITAL SIGNS: Temp:  [98.8 F (37.1 C)-101.3 F (38.5 C)] 100.2 F (37.9 C) (12/20 0800) Pulse Rate:  [79-131] 80 (12/20 0800) Resp:  [15-31] 21 (12/20 0800) BP: (131-180)/(54-101) 140/74 (12/20 0800) SpO2:  [95 %-100 %] 99 % (12/20 0800) Weight:  [72.3 kg (159 lb 6.3 oz)] 72.3 kg (159 lb 6.3 oz) (12/20 0500)   VENTILATOR SETTINGS:     INTAKE / OUTPUT: I/O last 3 completed shifts: In: 2111 [I.V.:1636; Other:210; NG/GT:165; IV Piggyback:100] Out: 8676 [HMCNO:7096; Other:3701]  PHYSICAL EXAMINATION: General: Ill-appearing man, in bed in restraints, squirming around HEENT: Poor dentition no apparent oral lesions, left HD catheter in place PSY: Tries to speak but garbled, intermittently yells out, not meaningfully interacting Neuro: Eyes closed but does respond to voice and stimulation, moving all extremities CV: Regular, no murmur PULM: Decreased bibasilar breath sounds GI: Soft, benign, positive bowel sounds Extremities: Left lower extremity BKA, no edema Skin: No rash  LABS: Cbc Recent Labs  Lab 07/25/17 0503 07/26/17 0418 07/27/17 0337  WBC 7.8 10.5 12.3*  HGB 11.0* 10.5* 9.3*  HCT 35.3* 32.5* 28.2*  PLT 52* 58* 65*    Chemistry  Recent Labs  Lab 07/22/17 0527  07/23/17 0500  07/24/17 0445  07/26/17 0418 07/26/17 1543 07/27/17 0337  NA  --    < > 139  140   < > 137   < > 139 141 141  K  --     < > 3.5  3.6   < > 4.0   < > 3.7 3.6 3.2*  CL  --    < > 108  108   < > 105   < > 104 109 109  CO2  --    < > 24  24   < > 27   < > 26 23 26   BUN  --    < > 29*  29*   < > 21*   < > 25* 28* 33*  CREATININE  --    < > 2.57*  2.60*   < > 1.67*   < > 1.13 1.29* 1.61*  CALCIUM  --    < > 7.3*  7.2*   < > 7.6*   < > 8.2* 8.1* 8.3*  MG 1.9  --  1.9  --  2.1  --   --   --   --   PHOS  --    < > 2.1*   < > 1.5*   < > 1.8* 2.2* 2.6  GLUCOSE  --    < > 149*  148*   < > 204*   < > 134* 125* 84   < > = values in this interval not displayed.    Liver fxn Recent Labs  Lab 07/26/17 0418 07/26/17 1543 07/27/17 0337  ALBUMIN 2.5* 2.3* 2.1*   coags Recent Labs  Lab 07/22/17 0527 07/23/17 0500 07/24/17 0445  APTT 38* 59* 36  INR  --   --  1.34   Sepsis markers Recent Labs  Lab 07/20/17 1203 07/21/17 0313 07/22/17 0527  LATICACIDVEN 2.6*  --   --   PROCALCITON  --  51.60 32.93   Cardiac markers No results for input(s): CKTOTAL, CKMB, TROPONINI in the last 168 hours.   BNP No results for input(s): PROBNP in the last 168 hours.   ABG No results for input(s): PHART, PCO2ART, PO2ART, HCO3, TCO2 in the last 168 hours.  CBG trend Recent Labs  Lab 07/26/17 1912 07/26/17 2325 07/27/17 0526 07/27/17 0836 07/27/17 1112  GLUCAP 124* 81 75 52 91    IMAGING: Dg Chest Port 1 View  Result Date: 07/26/2017 CLINICAL DATA:  Acute respiratory failure EXAM: PORTABLE CHEST 1 VIEW COMPARISON:  07/25/2017 FINDINGS: Endotracheal tube removed. Central venous catheter tip in the SVC unchanged. Gastric tube removed. Left chest tube removed. Bilateral airspace disease, probable edema, is similar. Bibasilar atelectasis and effusion similar. IMPRESSION: Endotracheal tube removed. Left chest tube removed. No pneumothorax. Bilateral airspace disease unchanged, probable edema. Bibasilar atelectasis and effusion unchanged. Electronically Signed   By: Franchot Gallo M.D.   On: 07/26/2017 06:36     LINES / TUBES: ETT 12/10 >> 12/18 Lt IJ HD cath 12/15 >>  CULTURES: Sputum 12/10 >> oral flora Blood 12/10 >> negative Pneumococcal ag 12/10 >> negative Legionella ag 12/10 >> negative C diff 12/13 >> negative Sputum 12/14 >> negative  ANTIBIOTICS: Unasyn 12/10 >> 12/14 maxipime 12/14 >> 12/19 Vanc 12/14 >> 12/16  STUDIES: Renal u/s 12/13 >> small amount of ascites, normal kidneys  SIGNIFICANT EVENTS:  12/13 noted to have diarrhea bloody stools with a negative C. difficile 12/14 started CVVHD 12/18 UOP 476ml, volume status improved, levo at 52mcg, high sedation > turned off extubated  12/19 More alert, remains on 2.4 precedex   DISCUSSION: 61 yo with ETOH withdrawal, aspiration pneumonia, VDRF complicated by acute renal failure and septic shock.  UDS positive for THC. Prolonged ICU stay due to agitated delirium, alcohol withdrawal, high sedation needs.      ASSESSMENT / PLAN:  Acute respiratory failure with hypoxia secondary to aspiration pneumonia and acute pulmonary edema.  Improved but mental status and airway protection are now issues Tobacco abuse. Bilateral pleural effusions, likely related to overall volume status P: Continue pulmonary hygiene, we will need to decrease his sedating medication slightly to improve his airway protection and decrease risk for potential reintubation Wean oxygen, goal SPO2 greater than 90 Mobilize Antibiotics completed as below  Septic shock 2nd to pneumonia. Acute pulmonary edema. Diffuse cardiac hypokinesis, EF 20-25%, question due to sepsis Hx of HTN, PACs, PAD. P:  Follow telemetry off norepinephrine and stress dose steroids Likely needs an ischemic workup once he stabilized from this acute illness  Acute renal failure from hypovolemia and ATN.  Serum creatinine up slightly but urine output is good.  Hopefully he will not require any further CVVHD, is heading for full renal recovery. Total body volume  overload Hypophosphatemia Hypokalemia P: Appreciate nephrology assistance.  Following renal function and urine output off of CVVHD Follow BMP, urine output  Moderate protein calorie malnutrition. At Risk Aspiration  P: Not getting any nutrition currently but I doubt he would leave an NG tube in place N.p.o. for now SLP evaluation once his mental status will tolerate  Anemia of critical illness and chronic disease. Thrombocytopenia 2nd to sepsis and hx of ETOH.  HIT negative 12/12.  P: Follow  CBC Transfuse per ICU guidelines  DM type II. P:  On a scale insulin as ordered  Acute metabolic encephalopathy. Acute alcohol withdrawal. Sedation for mechanical ventilation P: PT consult appreciated, he cannot participate right now.  Will initiate when he has shown some degree of recovery I will start scheduled low dose ativan, decrease his Precedex ceiling 12/20, attempt to decrease the frequency of any other prn sedating medications.  Will need to balance with his degree of delirium He has completed an adequate course of thiamine and folate, discontinued 12/20   Family: Dr Lamonte Sakai called brother Gerald Stabs 07/24/17, left message. No family present 12/19 or 12/20  DVT prophylaxis - SCDs SUP - protonix Nutrition - NPO Goals of care - Full code  Independent CC time 32 minutes   Baltazar Apo, MD, PhD 07/27/2017, 12:04 PM [facility] [phone] or if no answer [phone]

## 2017-07-28 LAB — CBC
HEMATOCRIT: 29.4 % — AB (ref 39.0–52.0)
HEMOGLOBIN: 9.6 g/dL — AB (ref 13.0–17.0)
MCH: 28.9 pg (ref 26.0–34.0)
MCHC: 32.7 g/dL (ref 30.0–36.0)
MCV: 88.6 fL (ref 78.0–100.0)
Platelets: 93 10*3/uL — ABNORMAL LOW (ref 150–400)
RBC: 3.32 MIL/uL — AB (ref 4.22–5.81)
RDW: 18.1 % — ABNORMAL HIGH (ref 11.5–15.5)
WBC: 14.7 10*3/uL — ABNORMAL HIGH (ref 4.0–10.5)

## 2017-07-28 LAB — RENAL FUNCTION PANEL
ALBUMIN: 2 g/dL — AB (ref 3.5–5.0)
ANION GAP: 8 (ref 5–15)
BUN: 39 mg/dL — ABNORMAL HIGH (ref 6–20)
CALCIUM: 7.9 mg/dL — AB (ref 8.9–10.3)
CO2: 24 mmol/L (ref 22–32)
Chloride: 114 mmol/L — ABNORMAL HIGH (ref 101–111)
Creatinine, Ser: 1.74 mg/dL — ABNORMAL HIGH (ref 0.61–1.24)
GFR calc non Af Amer: 41 mL/min — ABNORMAL LOW (ref >60)
GFR, EST AFRICAN AMERICAN: 48 mL/min — AB (ref >60)
Glucose, Bld: 104 mg/dL — ABNORMAL HIGH (ref 65–99)
PHOSPHORUS: 4.7 mg/dL — AB (ref 2.5–4.6)
POTASSIUM: 3.6 mmol/L (ref 3.5–5.1)
SODIUM: 146 mmol/L — AB (ref 135–145)

## 2017-07-28 LAB — GLUCOSE, CAPILLARY
GLUCOSE-CAPILLARY: 100 mg/dL — AB (ref 65–99)
GLUCOSE-CAPILLARY: 102 mg/dL — AB (ref 65–99)
GLUCOSE-CAPILLARY: 87 mg/dL (ref 65–99)
GLUCOSE-CAPILLARY: 90 mg/dL (ref 65–99)
GLUCOSE-CAPILLARY: 97 mg/dL (ref 65–99)
Glucose-Capillary: 88 mg/dL (ref 65–99)
Glucose-Capillary: 99 mg/dL (ref 65–99)

## 2017-07-28 MED ORDER — HYDRALAZINE HCL 20 MG/ML IJ SOLN
5.0000 mg | Freq: Four times a day (QID) | INTRAMUSCULAR | Status: DC | PRN
  Administered 2017-07-28 – 2017-07-29 (×4): 5 mg via INTRAVENOUS
  Filled 2017-07-28 (×5): qty 1

## 2017-07-28 NOTE — Progress Notes (Signed)
Admit: 07/17/2017 LOS: 67  32M with AKI, req CRRT stopped 12/19, ETOH withdrawal with VDRF and aspiration PNA  Subjective:  Good UOP yesterday, 1.8L SCr slightly up; K, HCO3 ok Somewhat improved mental status; waxes and wanes; still on sedation  12/20 0701 - 12/21 0700 In: 866.3 [I.V.:866.3] Out: 1800 [Urine:1800]  Filed Weights   07/25/17 0500 07/26/17 0500 07/27/17 0500  Weight: 82.1 kg (180 lb 16 oz) 76.1 kg (167 lb 12.3 oz) 72.3 kg (159 lb 6.3 oz)    Scheduled Meds: . chlorhexidine gluconate (MEDLINE KIT)  15 mL Mouth Rinse BID  . Chlorhexidine Gluconate Cloth  6 each Topical Daily  . insulin aspart  0-15 Units Subcutaneous Q4H  . LORazepam  1 mg Intravenous QID  . mouth rinse  15 mL Mouth Rinse QID  . ranitidine  150 mg Per Tube Daily  . simvastatin  40 mg Per Tube QHS  . sodium chloride flush  10-40 mL Intracatheter Q12H   Continuous Infusions: . dexmedetomidine 0.8 mcg/kg/hr (07/28/17 1130)   PRN Meds:.albuterol, haloperidol lactate, loperamide, midazolam, sodium chloride flush  Current Labs: reviewed    Physical Exam:  Blood pressure (!) 187/75, pulse 74, temperature 100 F (37.8 C), resp. rate 20, height 6' (1.829 m), weight 72.3 kg (159 lb 6.3 oz), SpO2 99 %. Sedated, doesn't respond to verbal stimuli Tachy, regular Coarse b/l S/nt/nd, +BS No LEE   A 1. Nonoliguric AKI, ?recovering, was on CRRT 12/14-12/19; likely normal baseline GFR; neg renal US 2. Aspiration PNA with VDRF off ABX; extubated 3. Septic Shock 4. Alcohol Abuse with withdrawal delirium 5. Reduced LVEF 6. Hx/o HTN  P 1. Will continue to follow, hopefuly is recovering 2. Daily weights, Daily Renal Panel, Strict I/Os, Avoid nephrotoxins (NSAIDs, judicious IV Contrast)    Pearson Grippe MD 07/28/2017, 11:53 AM  Recent Labs  Lab 07/26/17 1543 07/27/17 0337 07/28/17 0431  NA 141 141 146*  K 3.6 3.2* 3.6  CL 109 109 114*  CO2 _0 GLUCOSE 125* 84 104*  BUN 28* 33* 39*   CREATININE 1.29* 1.61* 1.74*  CALCIUM 8.1* 8.3* 7.9*  PHOS 2.2* 2.6 4.7*   Recent Labs  Lab 07/23/17 0500  07/26/17 0418 07/27/17 0337 07/28/17 0431  WBC 5.7   < > 10.5 12.3* 14.7*  NEUTROABS 4.6  --   --   --   --   HGB 9.5*   < > 10.5* 9.3* 9.6*  HCT 29.6*   < > 32.5* 28.2* 29.4*  MCV 90.5   < > 89.0 87.6 88.6  PLT 22*   < > 58* 65* 93*   < > = values in this interval not displayed.

## 2017-07-28 NOTE — Progress Notes (Signed)
PULMONARY  / CRITICAL CARE MEDICINE  Name: Andrew [name] MRN: [id number] DOB: 1955-12-04    LOS: 56  REFERRING MD :  Dr. Tomi Bamberger  CHIEF COMPLAINT:  Shortness of Breath  BRIEF PATIENT DESCRIPTION:  61 yo male smoker presented to [facility] ER 12/10 with ETOH withdrawal.  Required intubation for aspiration pneumonia.  Prolonged ICU course due to agitated delirium, alcohol withdrawal.   PMHx of PAD, HTN, HLD, GERD, DM.   SUBJECTIVE:  Remains on Precedex but significantly improved this morning.  Able to converse with me and is better oriented.  Still has some slurred speech.  He is asking for water   VITAL SIGNS: Temp:  [99.5 F (37.5 C)-101.1 F (38.4 C)] 100 F (37.8 C) (12/21 0800) Pulse Rate:  [65-83] 74 (12/21 0800) Resp:  [14-48] 20 (12/21 0800) BP: (140-190)/(63-87) 187/75 (12/21 0800) SpO2:  [97 %-100 %] 99 % (12/21 0800)   VENTILATOR SETTINGS:     INTAKE / OUTPUT: I/O last 3 completed shifts: In: 1580.3 [I.V.:1580.3] Out: 2800 [Urine:2800]  PHYSICAL EXAMINATION: General: Ill-appearing man, more calm, more appropriate HEENT: Poor dentition, dry oropharynx with some coating on his teeth, left HD catheter in place PSY: Oriented to self, place Neuro: Wakes easily to voice, answers questions much more appropriately and with much less agitation.  Moves all extremities CV: Regular no murmurs heard PULM: Decreased bibasilar breath sounds GI: Soft, positive bowel sounds Extremities: Left lower extremity BKA, no other deformities, no edema Skin: No rashes  LABS: Cbc Recent Labs  Lab 07/26/17 0418 07/27/17 0337 07/28/17 0431  WBC 10.5 12.3* 14.7*  HGB 10.5* 9.3* 9.6*  HCT 32.5* 28.2* 29.4*  PLT 58* 65* 93*    Chemistry  Recent Labs  Lab 07/22/17 0527  07/23/17 0500  07/24/17 0445  07/26/17 1543 07/27/17 0337 07/28/17 0431  NA  --    < > 139  140   < > 137   < > 141 141 146*  K  --    < > 3.5  3.6   < > 4.0   < > 3.6 3.2* 3.6  CL  --    < > 108  108   <  > 105   < > 109 109 114*  CO2  --    < > 24  24   < > 27   < > 23 26 24   BUN  --    < > 29*  29*   < > 21*   < > 28* 33* 39*  CREATININE  --    < > 2.57*  2.60*   < > 1.67*   < > 1.29* 1.61* 1.74*  CALCIUM  --    < > 7.3*  7.2*   < > 7.6*   < > 8.1* 8.3* 7.9*  MG 1.9  --  1.9  --  2.1  --   --   --   --   PHOS  --    < > 2.1*   < > 1.5*   < > 2.2* 2.6 4.7*  GLUCOSE  --    < > 149*  148*   < > 204*   < > 125* 84 104*   < > = values in this interval not displayed.    Liver fxn Recent Labs  Lab 07/26/17 1543 07/27/17 0337 07/28/17 0431  ALBUMIN 2.3* 2.1* 2.0*   coags Recent Labs  Lab 07/22/17 0527 07/23/17 0500 07/24/17 0445  APTT 38* 59* 36  INR  --   --  1.34   Sepsis markers Recent Labs  Lab 07/22/17 0527  PROCALCITON 32.93   Cardiac markers No results for input(s): CKTOTAL, CKMB, TROPONINI in the last 168 hours.   BNP No results for input(s): PROBNP in the last 168 hours.   ABG No results for input(s): PHART, PCO2ART, PO2ART, HCO3, TCO2 in the last 168 hours.  CBG trend Recent Labs  Lab 07/27/17 1600 07/27/17 2009 07/28/17 0020 07/28/17 0407 07/28/17 0716  GLUCAP 87 90 87 100* 97    IMAGING: No results found.  LINES / TUBES: ETT 12/10 >> 12/18 Lt IJ HD cath 12/15 >>  CULTURES: Sputum 12/10 >> oral flora Blood 12/10 >> negative Pneumococcal ag 12/10 >> negative Legionella ag 12/10 >> negative C diff 12/13 >> negative Sputum 12/14 >> negative  ANTIBIOTICS: Unasyn 12/10 >> 12/14 maxipime 12/14 >> 12/19 Vanc 12/14 >> 12/16  STUDIES: Renal u/s 12/13 >> small amount of ascites, normal kidneys  SIGNIFICANT EVENTS:  12/13 noted to have diarrhea bloody stools with a negative C. difficile 12/14 started CVVHD 12/18 UOP 441ml, volume status improved, levo at 5mcg, high sedation > turned off extubated  12/19 More alert, remains on 2.4 precedex   DISCUSSION: 61 yo with ETOH withdrawal, aspiration pneumonia, VDRF complicated by acute renal  failure and septic shock.  UDS positive for THC. Prolonged ICU stay due to agitated delirium, alcohol withdrawal, high sedation needs.  Slowly improving   ASSESSMENT / PLAN:  Acute respiratory failure with hypoxia secondary to aspiration pneumonia and acute pulmonary edema.  Improved but mental status and airway protection are now issues Tobacco abuse. Bilateral pleural effusions, likely related to overall volume status P: Aggressive pulmonary hygiene Continue to wean sedating medicine as able Wean oxygen goal SPO2 greater than 90% Mobilize Antibiotics completed  Septic shock 2nd to pneumonia. Acute pulmonary edema. Diffuse cardiac hypokinesis, EF 20-25%, question due to sepsis Hx of HTN, PACs, PAD. P:  Stress dose steroids and norepinephrine discontinued He will likely need an ischemic workup for possible coronary disease once he stabilized from his acute illness  Acute renal failure from hypovolemia and ATN.  Serum creatinine up slightly but urine output is good.  Hopefully he will not require any further CVVHD, is heading for full renal recovery.  Good urine output but his serum creatinine has not yet peaked Total body volume overload Hypophosphatemia Hypokalemia P: Continue to follow urine output with post ATN diuresis.  His serum creatinine has not yet peaked but I anticipate renal recovery to as yet undetermined baseline  Moderate protein calorie malnutrition. At Risk Aspiration  P:  N.p.o., I do not believe he would leave an NG tube in place Evaluate for possible sips of clears today, consider SLP evaluation for a diet if he continues to improve over the next 24 hours  Anemia of critical illness and chronic disease. Thrombocytopenia 2nd to sepsis and hx of ETOH.  HIT negative 12/12.  P: Continue to follow CBC Transfuse per ICU guidelines  DM type II. P:  SSI per protocol  Acute metabolic encephalopathy. Acute alcohol withdrawal. Sedation for mechanical  ventilation P: Reinitiate physical therapy once he can participate Scheduled Ativan. Continue to wean his Precedex Thiamine and folate completed    Family: Dr Lamonte Sakai called brother Gerald Stabs 07/24/17, left message. No family present  12/21  DVT prophylaxis - SCDs SUP - protonix Nutrition - NPO Goals of care - Full code  Independent CC time 33 minutes   Herbie [place]  Lamonte Sakai, MD, PhD 07/28/2017, 10:42 AM [facility] [phone] or if no answer [phone]

## 2017-07-28 NOTE — Progress Notes (Signed)
Date: July 28, 2017 Velva Harman, BSN, [place], [place] Chart and notes review for patient progress and needs.continues on CRRT>   Will follow for case management and discharge needs. Next review date: [id number]

## 2017-07-28 NOTE — Progress Notes (Signed)
PT Cancellation Note  Patient Details Name: Andrew [name] MRN: [id number] DOB: May 18, 1956   Cancelled Treatment:    Reason Eval/Treat Not Completed: Patient's level of consciousness, is on medication to decrease agitation and is not able to participate.  Will check back when more stable for mobility. Claretha Cooper 07/28/2017, 12:38 PM  Tresa Endo PT [phone]

## 2017-07-29 ENCOUNTER — Inpatient Hospital Stay (HOSPITAL_COMMUNITY): Payer: Medicare Other

## 2017-07-29 LAB — RENAL FUNCTION PANEL
ALBUMIN: 2 g/dL — AB (ref 3.5–5.0)
Anion gap: 8 (ref 5–15)
BUN: 33 mg/dL — AB (ref 6–20)
CALCIUM: 7.6 mg/dL — AB (ref 8.9–10.3)
CO2: 22 mmol/L (ref 22–32)
CREATININE: 1.32 mg/dL — AB (ref 0.61–1.24)
Chloride: 118 mmol/L — ABNORMAL HIGH (ref 101–111)
GFR calc Af Amer: >60 mL/min (ref >60)
GFR, EST NON AFRICAN AMERICAN: 57 mL/min — AB (ref >60)
GLUCOSE: 100 mg/dL — AB (ref 65–99)
PHOSPHORUS: 3.9 mg/dL (ref 2.5–4.6)
Potassium: 3.2 mmol/L — ABNORMAL LOW (ref 3.5–5.1)
SODIUM: 148 mmol/L — AB (ref 135–145)

## 2017-07-29 LAB — GLUCOSE, CAPILLARY
GLUCOSE-CAPILLARY: 102 mg/dL — AB (ref 65–99)
GLUCOSE-CAPILLARY: 104 mg/dL — AB (ref 65–99)
Glucose-Capillary: 96 mg/dL (ref 65–99)
Glucose-Capillary: 103 mg/dL — ABNORMAL HIGH (ref 65–99)
Glucose-Capillary: 128 mg/dL — ABNORMAL HIGH (ref 65–99)

## 2017-07-29 LAB — COMPREHENSIVE METABOLIC PANEL
ALT: 289 U/L — ABNORMAL HIGH (ref 17–63)
ANION GAP: 9 (ref 5–15)
AST: 199 U/L — ABNORMAL HIGH (ref 15–41)
Albumin: 2.1 g/dL — ABNORMAL LOW (ref 3.5–5.0)
Alkaline Phosphatase: 176 U/L — ABNORMAL HIGH (ref 38–126)
BUN: 35 mg/dL — ABNORMAL HIGH (ref 6–20)
CHLORIDE: 119 mmol/L — AB (ref 101–111)
CO2: 22 mmol/L (ref 22–32)
CREATININE: 1.44 mg/dL — AB (ref 0.61–1.24)
Calcium: 7.7 mg/dL — ABNORMAL LOW (ref 8.9–10.3)
GFR, EST AFRICAN AMERICAN: 60 mL/min — AB (ref >60)
GFR, EST NON AFRICAN AMERICAN: 52 mL/min — AB (ref >60)
Glucose, Bld: 100 mg/dL — ABNORMAL HIGH (ref 65–99)
POTASSIUM: 3.2 mmol/L — AB (ref 3.5–5.1)
SODIUM: 150 mmol/L — AB (ref 135–145)
Total Bilirubin: 2.3 mg/dL — ABNORMAL HIGH (ref 0.3–1.2)
Total Protein: 4.9 g/dL — ABNORMAL LOW (ref 6.5–8.1)

## 2017-07-29 LAB — BLOOD GAS, ARTERIAL
ACID-BASE DEFICIT: 1.1 mmol/L (ref 0.0–2.0)
Bicarbonate: 21.6 mmol/L (ref 20.0–28.0)
DRAWN BY: 232811
FIO2: 21
O2 SAT: 87.3 %
PATIENT TEMPERATURE: 99.1
PCO2 ART: 31.3 mmHg — AB (ref 32.0–48.0)
pH, Arterial: 7.455 — ABNORMAL HIGH (ref 7.350–7.450)
pO2, Arterial: 58.5 mmHg — ABNORMAL LOW (ref 83.0–108.0)

## 2017-07-29 LAB — C DIFFICILE QUICK SCREEN W PCR REFLEX
C DIFFICILE (CDIFF) TOXIN: NEGATIVE
C DIFFICLE (CDIFF) ANTIGEN: NEGATIVE
C Diff interpretation: NOT DETECTED

## 2017-07-29 MED ORDER — HYDRALAZINE HCL 20 MG/ML IJ SOLN
10.0000 mg | Freq: Four times a day (QID) | INTRAMUSCULAR | Status: DC | PRN
  Administered 2017-07-29 – 2017-08-04 (×7): 10 mg via INTRAVENOUS
  Filled 2017-07-29 (×5): qty 1

## 2017-07-29 MED ORDER — INSULIN ASPART 100 UNIT/ML ~~LOC~~ SOLN: 0.0000 [IU] | Freq: Every day | SUBCUTANEOUS | Status: DC

## 2017-07-29 MED ORDER — METOPROLOL TARTRATE 25 MG PO TABS
50.0000 mg | ORAL_TABLET | Freq: Two times a day (BID) | ORAL | Status: DC
  Administered 2017-07-29 (×2): 50 mg via ORAL
  Filled 2017-07-29 (×2): qty 2

## 2017-07-29 MED ORDER — FAMOTIDINE 20 MG PO TABS
20.0000 mg | ORAL_TABLET | Freq: Every day | ORAL | Status: DC
  Administered 2017-07-29: 20 mg via ORAL
  Filled 2017-07-29: qty 1

## 2017-07-29 MED ORDER — GUAIFENESIN ER 600 MG PO TB12
600.0000 mg | ORAL_TABLET | Freq: Two times a day (BID) | ORAL | Status: DC
  Administered 2017-07-29: 600 mg via ORAL
  Filled 2017-07-29: qty 1

## 2017-07-29 MED ORDER — POTASSIUM CHLORIDE 10 MEQ/50ML IV SOLN
10.0000 meq | INTRAVENOUS | Status: AC
  Administered 2017-07-29 (×6): 10 meq via INTRAVENOUS
  Filled 2017-07-29 (×6): qty 50

## 2017-07-29 MED ORDER — LOPERAMIDE HCL 1 MG/5ML PO LIQD
1.0000 mg | ORAL | Status: DC | PRN
  Administered 2017-07-29: 1 mg via ORAL
  Filled 2017-07-29 (×2): qty 5

## 2017-07-29 MED ORDER — MIDAZOLAM HCL 2 MG/2ML IJ SOLN: 1.0000 mg | INTRAMUSCULAR | Status: DC | PRN

## 2017-07-29 MED ORDER — METOPROLOL TARTRATE 5 MG/5ML IV SOLN
5.0000 mg | Freq: Four times a day (QID) | INTRAVENOUS | Status: DC | PRN
  Administered 2017-07-29 – 2017-08-04 (×5): 5 mg via INTRAVENOUS
  Filled 2017-07-29 (×5): qty 5

## 2017-07-29 MED ORDER — INSULIN ASPART 100 UNIT/ML ~~LOC~~ SOLN
0.0000 [IU] | SUBCUTANEOUS | Status: DC
  Administered 2017-07-30 – 2017-08-02 (×6): 2 [IU] via SUBCUTANEOUS
  Administered 2017-08-02: 3 [IU] via SUBCUTANEOUS
  Administered 2017-08-03: 2 [IU] via SUBCUTANEOUS

## 2017-07-29 MED ORDER — CLONIDINE HCL 0.1 MG/24HR TD PTWK
0.1000 mg | MEDICATED_PATCH | TRANSDERMAL | Status: DC
  Administered 2017-07-29: 0.1 mg via TRANSDERMAL
  Filled 2017-07-29: qty 1

## 2017-07-29 MED ORDER — ORAL CARE MOUTH RINSE
15.0000 mL | Freq: Four times a day (QID) | OROMUCOSAL | Status: DC
  Administered 2017-07-30 – 2017-08-09 (×37): 15 mL via OROMUCOSAL

## 2017-07-29 MED ORDER — INSULIN ASPART 100 UNIT/ML ~~LOC~~ SOLN: 0.0000 [IU] | Freq: Three times a day (TID) | SUBCUTANEOUS | Status: DC

## 2017-07-29 MED ORDER — DEXMEDETOMIDINE HCL IN NACL 200 MCG/50ML IV SOLN
0.0000 ug/kg/h | INTRAVENOUS | Status: AC
  Administered 2017-07-30: 0.4 ug/kg/h via INTRAVENOUS
  Administered 2017-07-30: 0.7 ug/kg/h via INTRAVENOUS
  Administered 2017-07-30: 1 ug/kg/h via INTRAVENOUS
  Administered 2017-07-30: 0.9 ug/kg/h via INTRAVENOUS
  Administered 2017-07-30: 0.7 ug/kg/h via INTRAVENOUS
  Administered 2017-07-30 – 2017-07-31 (×3): 1 ug/kg/h via INTRAVENOUS
  Administered 2017-07-31: 0.8 ug/kg/h via INTRAVENOUS
  Administered 2017-07-31: 0.9 ug/kg/h via INTRAVENOUS
  Administered 2017-07-31: 0.5 ug/kg/h via INTRAVENOUS
  Administered 2017-08-01 (×5): 1 ug/kg/h via INTRAVENOUS
  Administered 2017-08-01: 1.2 ug/kg/h via INTRAVENOUS
  Administered 2017-08-01: 1 ug/kg/h via INTRAVENOUS
  Administered 2017-08-01: 0.9 ug/kg/h via INTRAVENOUS
  Administered 2017-08-01: 1.2 ug/kg/h via INTRAVENOUS
  Administered 2017-08-01: 1 ug/kg/h via INTRAVENOUS
  Filled 2017-07-29 (×3): qty 50
  Filled 2017-07-29: qty 100
  Filled 2017-07-29 (×4): qty 50
  Filled 2017-07-29 (×2): qty 100
  Filled 2017-07-29 (×4): qty 50
  Filled 2017-07-29: qty 100
  Filled 2017-07-29 (×7): qty 50

## 2017-07-29 MED ORDER — ACETAMINOPHEN 325 MG PO TABS
650.0000 mg | ORAL_TABLET | Freq: Three times a day (TID) | ORAL | Status: DC | PRN
  Administered 2017-07-30: 650 mg via ORAL
  Filled 2017-07-29: qty 2

## 2017-07-29 MED ORDER — SIMVASTATIN 40 MG PO TABS
40.0000 mg | ORAL_TABLET | Freq: Every day | ORAL | Status: DC
  Administered 2017-07-29: 40 mg via ORAL
  Filled 2017-07-29: qty 1

## 2017-07-29 MED ORDER — MIDAZOLAM HCL 2 MG/2ML IJ SOLN
2.0000 mg | INTRAMUSCULAR | Status: DC | PRN
  Administered 2017-07-30 – 2017-08-03 (×19): 2 mg via INTRAVENOUS
  Filled 2017-07-29 (×17): qty 2

## 2017-07-29 MED ORDER — PANTOPRAZOLE SODIUM 40 MG IV SOLR
40.0000 mg | Freq: Every day | INTRAVENOUS | Status: DC
  Administered 2017-07-30 – 2017-07-31 (×2): 40 mg via INTRAVENOUS
  Filled 2017-07-29 (×2): qty 40

## 2017-07-29 MED ORDER — FENTANYL CITRATE (PF) 100 MCG/2ML IJ SOLN
100.0000 ug | INTRAMUSCULAR | Status: DC | PRN
  Administered 2017-07-30 – 2017-08-07 (×16): 100 ug via INTRAVENOUS
  Filled 2017-07-29 (×18): qty 2

## 2017-07-29 MED ORDER — CHLORHEXIDINE GLUCONATE 0.12% ORAL RINSE (MEDLINE KIT)
15.0000 mL | Freq: Two times a day (BID) | OROMUCOSAL | Status: DC
  Administered 2017-07-30 – 2017-08-09 (×20): 15 mL via OROMUCOSAL

## 2017-07-29 MED ORDER — HALOPERIDOL LACTATE 5 MG/ML IJ SOLN
2.0000 mg | INTRAMUSCULAR | Status: DC | PRN
  Administered 2017-08-01 – 2017-08-02 (×2): 2 mg via INTRAVENOUS
  Filled 2017-07-29 (×3): qty 1

## 2017-07-29 MED ORDER — FENTANYL CITRATE (PF) 100 MCG/2ML IJ SOLN: 100.0000 ug | INTRAMUSCULAR | Status: DC | PRN

## 2017-07-29 MED ORDER — MIDAZOLAM HCL 2 MG/2ML IJ SOLN
2.0000 mg | INTRAMUSCULAR | Status: DC | PRN
  Filled 2017-07-29: qty 2

## 2017-07-29 NOTE — Progress Notes (Addendum)
Dr. Lamonte Sakai made aware of HR up to the 140's, Temp gradually climbing. Order received for BID Lopressor, prn Tylenol if temp >102, clonidine patch. Patient also having some difficulty with PO tablets. SLP eval ordered.
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
## 2017-07-29 NOTE — Progress Notes (Signed)
eLink Physician-Brief Progress Note Patient Name: Andrew [name] DOB: September 30, 1955 MRN: [id number]   Date of Service  07/29/2017  HPI/Events of Note  ABG and PCXR unremarkable with mild hypoxia.  Attempted to use BiPAP but progressive drop in sats to the high 70s low 80s.  Taken off BiPAP and placed on NRB with increase in sats but continued tachypnea.  eICU Interventions  Plan: Intubate Orders for vent and sedation F/U ABG and PCXR.     Intervention Category Major Interventions: Respiratory failure - evaluation and management  [name] 07/29/2017, 11:49 PM

## 2017-07-29 NOTE — Progress Notes (Signed)
Admit: 07/17/2017 LOS: 52  43M with AKI, req CRRT stopped 12/19, ETOH withdrawal with VDRF and aspiration PNA  Subjective:  More awake and alert, conversant today. Has a tridialysis catheter Good UOP yesterday, 2.0L Serum creatinine improved to 1.3 with potassium 3.2 and bicarbonate of 22.  12/21 0701 - 12/22 0700 In: 352.7 [I.V.:352.7] Out: 2550 [Urine:1950; Stool:600]  Filed Weights   07/26/17 0500 07/27/17 0500 07/29/17 0500  Weight: 76.1 kg (167 lb 12.3 oz) 72.3 kg (159 lb 6.3 oz) 74.6 kg (164 lb 7.4 oz)    Scheduled Meds: . chlorhexidine gluconate (MEDLINE KIT)  15 mL Mouth Rinse BID  . Chlorhexidine Gluconate Cloth  6 each Topical Daily  . famotidine  20 mg Oral Daily  . insulin aspart  0-15 Units Subcutaneous TID WC  . insulin aspart  0-5 Units Subcutaneous QHS  . LORazepam  1 mg Intravenous QID  . mouth rinse  15 mL Mouth Rinse QID  . simvastatin  40 mg Oral QHS  . sodium chloride flush  10-40 mL Intracatheter Q12H   Continuous Infusions: . dexmedetomidine 0.499 mcg/kg/hr (07/29/17 0600)   PRN Meds:.albuterol, haloperidol lactate, hydrALAZINE, loperamide, metoprolol tartrate, midazolam, sodium chloride flush  Current Labs: reviewed    Physical Exam:  Blood pressure (!) 168/83, pulse (!) 122, temperature 99.9 F (37.7 C), temperature source Core, resp. rate (!) 48, height 6' (1.829 m), weight 74.6 kg (164 lb 7.4 oz), SpO2 95 %. Awakens to voice, conversant Tachy, regular Coarse b/l S/nt/nd, +BS No LEE   A 1. Nonoliguric AKI, recovered, was on CRRT 12/14-12/19; likely normal baseline GFR; neg renal US 2. Aspiration PNA with VDRF off ABX; extubated 3. Septic Shock 4. Alcohol Abuse with withdrawal delirium 5. Reduced LVEF 6. Hx/o HTN  P 1. Patient has recovered, expect will return to normal serum creatinine/GFR. 2. No further inpatient renal needs. 3. We will not need to follow-up with nephrology unless there are new renal issues 4. Will sign off for  now   Pearson Grippe MD 07/29/2017, 11:55 AM  Recent Labs  Lab 07/27/17 0337 07/28/17 0431 07/29/17 0321 07/29/17 0500  NA 141 146* 150* 148*  K 3.2* 3.6 3.2* 3.2*  CL 109 114* 119* 118*  CO2 26 24 22 22   GLUCOSE 84 104* 100* 100*  BUN 33* 39* 35* 33*  CREATININE 1.61* 1.74* 1.44* 1.32*  CALCIUM 8.3* 7.9* 7.7* 7.6*  PHOS 2.6 4.7*  --  3.9   Recent Labs  Lab 07/23/17 0500  07/26/17 0418 07/27/17 0337 07/28/17 0431  WBC 5.7   < > 10.5 12.3* 14.7*  NEUTROABS 4.6  --   --   --   --   HGB 9.5*   < > 10.5* 9.3* 9.6*  HCT 29.6*   < > 32.5* 28.2* 29.4*  MCV 90.5   < > 89.0 87.6 88.6  PLT 22*   < > 58* 65* 93*   < > = values in this interval not displayed.

## 2017-07-29 NOTE — Progress Notes (Signed)
PULMONARY  / CRITICAL CARE MEDICINE  Name: Andrew [name] MRN: [id number] DOB: [birth year]    LOS: 21  REFERRING MD :  Dr. Tomi Bamberger  CHIEF COMPLAINT:  Shortness of Breath  BRIEF PATIENT DESCRIPTION:  61 yo male smoker presented to [facility] ER 12/10 with ETOH withdrawal.  Required intubation for aspiration pneumonia.  Prolonged ICU course due to agitated delirium, alcohol withdrawal.   PMHx of PAD, HTN, HLD, GERD, DM.   SUBJECTIVE:  Precedex weaned to off this morning Some tachycardia and hypertension but overall withdrawal symptoms are improving Still having significant diarrhea, Flexi-Seal placed yesterday, his last antibiotics were 12/19.  White blood cell count rising   VITAL SIGNS: Temp:  [99.5 F (37.5 C)-100.2 F (37.9 C)] 99.9 F (37.7 C) (12/22 0800) Pulse Rate:  [73-122] 122 (12/22 0800) Resp:  [14-50] 48 (12/22 0800) BP: (122-190)/(47-126) 168/83 (12/22 0800) SpO2:  [94 %-100 %] 95 % (12/22 0800) Weight:  [74.6 kg (164 lb 7.4 oz)] 74.6 kg (164 lb 7.4 oz) (12/22 0500)   VENTILATOR SETTINGS:     INTAKE / OUTPUT: I/O last 3 completed shifts: In: 1219 [I.V.:1219] Out: 3550 [Urine:2950; Stool:600]  PHYSICAL EXAMINATION: General: Ill-appearing man, more calm, more appropriate HEENT: Poor dentition, dry oropharynx with some coating on his teeth, left HD catheter in place PSY: Oriented to self, place Neuro: Wakes easily to voice, answers questions much more appropriately and with much less agitation.  Moves all extremities CV: Regular no murmurs heard PULM: Decreased bibasilar breath sounds GI: Soft, positive bowel sounds Extremities: Left lower extremity BKA, no other deformities, no edema Skin: No rashes  LABS: Cbc Recent Labs  Lab 07/26/17 0418 07/27/17 0337 07/28/17 0431  WBC 10.5 12.3* 14.7*  HGB 10.5* 9.3* 9.6*  HCT 32.5* 28.2* 29.4*  PLT 58* 65* 93*    Chemistry  Recent Labs  Lab 07/23/17 0500  07/24/17 0445  07/27/17 0337 07/28/17 0431  07/29/17 0321 07/29/17 0500  NA 139  140   < > 137   < > 141 146* 150* 148*  K 3.5  3.6   < > 4.0   < > 3.2* 3.6 3.2* 3.2*  CL 108  108   < > 105   < > 109 114* 119* 118*  CO2 24  24   < > 27   < > 26 24 22 22   BUN 29*  29*   < > 21*   < > 33* 39* 35* 33*  CREATININE 2.57*  2.60*   < > 1.67*   < > 1.61* 1.74* 1.44* 1.32*  CALCIUM 7.3*  7.2*   < > 7.6*   < > 8.3* 7.9* 7.7* 7.6*  MG 1.9  --  2.1  --   --   --   --   --   PHOS 2.1*   < > 1.5*   < > 2.6 4.7*  --  3.9  GLUCOSE 149*  148*   < > 204*   < > 84 104* 100* 100*   < > = values in this interval not displayed.    Liver fxn Recent Labs  Lab 07/28/17 0431 07/29/17 0321 07/29/17 0500  AST  --  199*  --   ALT  --  289*  --   ALKPHOS  --  176*  --   BILITOT  --  2.3*  --   PROT  --  4.9*  --   ALBUMIN 2.0* 2.1* 2.0*   coags Recent Labs  Lab 07/23/17 0500 07/24/17 0445  APTT 59* 36  INR  --  1.34   Sepsis markers No results for input(s): LATICACIDVEN, PROCALCITON in the last 168 hours. Cardiac markers No results for input(s): CKTOTAL, CKMB, TROPONINI in the last 168 hours.   BNP No results for input(s): PROBNP in the last 168 hours.   ABG No results for input(s): PHART, PCO2ART, PO2ART, HCO3, TCO2 in the last 168 hours.  CBG trend Recent Labs  Lab 07/28/17 1550 07/28/17 1928 07/28/17 2301 07/29/17 0304 07/29/17 0719  GLUCAP 102* 90 99 102* 96    IMAGING: No results found.  LINES / TUBES: ETT 12/10 >> 12/18 Lt IJ HD cath 12/15 >>  CULTURES: Sputum 12/10 >> oral flora Blood 12/10 >> negative Pneumococcal ag 12/10 >> negative Legionella ag 12/10 >> negative C diff 12/13 >> negative Sputum 12/14 >> negative C diff 12/22 >>   ANTIBIOTICS: Unasyn 12/10 >> 12/14 maxipime 12/14 >> 12/19 Vanc 12/14 >> 12/16  STUDIES: Renal u/s 12/13 >> small amount of ascites, normal kidneys  SIGNIFICANT EVENTS:  12/13 noted to have diarrhea bloody stools with a negative C. difficile 12/14 started  CVVHD 12/18 UOP 479ml, volume status improved, levo at 42mcg, high sedation > turned off extubated  12/19 More alert, remains on 2.4 precedex   DISCUSSION: 61 yo with ETOH withdrawal, aspiration pneumonia, VDRF complicated by acute renal failure and septic shock.  UDS positive for THC. Prolonged ICU stay due to agitated delirium, alcohol withdrawal, high sedation needs.  Improved   ASSESSMENT / PLAN:  Acute respiratory failure with hypoxia secondary to aspiration pneumonia and acute pulmonary edema.  Improved but mental status and airway protection are now issues Tobacco abuse. Bilateral pleural effusions, likely related to overall volume status P: Continue aggressive pulmonary hygiene Mobilize Antibiotics for his pneumonia have been completed  Septic shock 2nd to pneumonia. Acute pulmonary edema. Diffuse cardiac hypokinesis, EF 20-25%, question due to sepsis Hx of HTN, PACs, PAD. P: Stress dose steroids and norepinephrine discontinued He will need an ischemic workup for possible coronary artery disease once he is stabilized from this acute illness  Acute renal failure from hypovolemia and ATN.  Serum creatinine has plateaued and output is good.  He is auto diuresing Total body volume overload Hypernatremia Hypophosphatemia Hypokalemia P: Continue to follow urine output with post ATN diuresis.   Restart some IV fluids over the next 24 hours if he continues to have significant urinary and diarrhea volume losses.  Moderate protein calorie malnutrition. At Risk Aspiration  P: Okay to start a renal diet 12/22  Anemia of critical illness and chronic disease. Thrombocytopenia 2nd to sepsis and hx of ETOH.  HIT negative 12/12.  P: Follow CBC Transfuse per ICU guidelines  DM type II. P:  Sliding scale insulin per protocol Carb modified diet  Acute metabolic encephalopathy. Acute alcohol withdrawal. Sedation for mechanical ventilation P:  Attempt out of bed, reinitiate  physical therapy Continue scheduled Ativan. Precedex off Thiamine and folate completed    Family: Dr Lamonte Sakai called brother Gerald Stabs 07/24/17, left message. No family present  12/21  DVT prophylaxis - SCDs SUP - protonix Nutrition - NPO Goals of care - Full code  Independent CC time 32 minutes   Baltazar Apo, MD, PhD 07/29/2017, 10:25 AM [place] [facility] [phone] or if no answer [phone]

## 2017-07-29 NOTE — Progress Notes (Signed)
eLink Physician-Brief Progress Note Patient Name: EULAS SCHWEITZER DOB: 1955/09/26 MRN: [id number]   Date of Service  07/29/2017  HPI/Events of Note  Increased WOB with use of accessory muscles and increased RR to 50s.  Has sats of 94% and HR of 123 BP of 184/102.  Is talking with GCS of 144.  Nurse reports that lung sounds are congested with increased secretions.  eICU Interventions  Plan: XOVA ABG PRN BiPAP     Intervention Category Intermediate Interventions: Respiratory distress - evaluation and management  Mayola Mcbain 07/29/2017, 9:22 PM

## 2017-07-30 ENCOUNTER — Inpatient Hospital Stay (HOSPITAL_COMMUNITY): Payer: Medicare Other

## 2017-07-30 ENCOUNTER — Inpatient Hospital Stay (HOSPITAL_COMMUNITY): Payer: Medicare Other | Admitting: Anesthesiology

## 2017-07-30 ENCOUNTER — Inpatient Hospital Stay (HOSPITAL_COMMUNITY): Payer: Medicare Other | Admitting: Certified Registered"

## 2017-07-30 LAB — BLOOD GAS, ARTERIAL
ACID-BASE DEFICIT: 1.5 mmol/L (ref 0.0–2.0)
BICARBONATE: 21.7 mmol/L (ref 20.0–28.0)
Drawn by: 232811
FIO2: 50
LHR: 18 {breaths}/min
MECHVT: 620 mL
O2 SAT: 83.5 %
PEEP/CPAP: 5 cmH2O
PH ART: 7.435 (ref 7.350–7.450)
Patient temperature: 36.9
pCO2 arterial: 32.8 mmHg (ref 32.0–48.0)
pO2, Arterial: 52.3 mmHg — ABNORMAL LOW (ref 83.0–108.0)

## 2017-07-30 LAB — GLUCOSE, CAPILLARY
GLUCOSE-CAPILLARY: 114 mg/dL — AB (ref 65–99)
GLUCOSE-CAPILLARY: 111 mg/dL — AB (ref 65–99)
GLUCOSE-CAPILLARY: 104 mg/dL — AB (ref 65–99)
GLUCOSE-CAPILLARY: 118 mg/dL — AB (ref 65–99)
Glucose-Capillary: 147 mg/dL — ABNORMAL HIGH (ref 65–99)
Glucose-Capillary: 118 mg/dL — ABNORMAL HIGH (ref 65–99)

## 2017-07-30 LAB — MAGNESIUM: MAGNESIUM: 1.4 mg/dL — AB (ref 1.7–2.4)

## 2017-07-30 LAB — CBC
HEMATOCRIT: 32.5 % — AB (ref 39.0–52.0)
Hemoglobin: 9.9 g/dL — ABNORMAL LOW (ref 13.0–17.0)
MCH: 27.9 pg (ref 26.0–34.0)
MCHC: 30.5 g/dL (ref 30.0–36.0)
MCV: 91.5 fL (ref 78.0–100.0)
PLATELETS: 135 10*3/uL — AB (ref 150–400)
RBC: 3.55 MIL/uL — AB (ref 4.22–5.81)
RDW: 18.2 % — ABNORMAL HIGH (ref 11.5–15.5)
WBC: 24.6 10*3/uL — AB (ref 4.0–10.5)

## 2017-07-30 LAB — BASIC METABOLIC PANEL
ANION GAP: 7 (ref 5–15)
BUN: 32 mg/dL — ABNORMAL HIGH (ref 6–20)
CHLORIDE: 123 mmol/L — AB (ref 101–111)
CO2: 24 mmol/L (ref 22–32)
Calcium: 8.1 mg/dL — ABNORMAL LOW (ref 8.9–10.3)
Creatinine, Ser: 1.41 mg/dL — ABNORMAL HIGH (ref 0.61–1.24)
GFR calc non Af Amer: 53 mL/min — ABNORMAL LOW (ref >60)
Glucose, Bld: 120 mg/dL — ABNORMAL HIGH (ref 65–99)
POTASSIUM: 3.3 mmol/L — AB (ref 3.5–5.1)
Sodium: 154 mmol/L — ABNORMAL HIGH (ref 135–145)

## 2017-07-30 MED ORDER — POTASSIUM CHLORIDE 10 MEQ/50ML IV SOLN
10.0000 meq | INTRAVENOUS | Status: AC
  Administered 2017-07-30 (×4): 10 meq via INTRAVENOUS
  Filled 2017-07-30 (×5): qty 50

## 2017-07-30 MED ORDER — LIDOCAINE HCL 2 % IJ SOLN
2.0000 mL | Freq: Once | INTRAMUSCULAR | Status: AC
Start: 2017-07-30 — End: 2017-07-30
  Administered 2017-07-30: 40 mg

## 2017-07-30 MED ORDER — SUCCINYLCHOLINE CHLORIDE 20 MG/ML IJ SOLN
INTRAMUSCULAR | Status: DC | PRN
  Administered 2017-07-30: 100 mg via INTRAVENOUS

## 2017-07-30 MED ORDER — COLLAGENASE 250 UNIT/GM EX OINT
TOPICAL_OINTMENT | Freq: Every day | CUTANEOUS | Status: DC
  Administered 2017-07-30 – 2017-08-06 (×8): via TOPICAL
  Administered 2017-08-07: 1 via TOPICAL
  Administered 2017-08-08: 14:00:00 via TOPICAL
  Filled 2017-07-30 (×2): qty 90

## 2017-07-30 MED ORDER — SODIUM CHLORIDE 0.9 % IV SOLN
INTRAVENOUS | Status: DC | PRN
  Administered 2017-07-30: via INTRAVENOUS

## 2017-07-30 MED ORDER — ETOMIDATE 2 MG/ML IV SOLN
INTRAVENOUS | Status: DC | PRN
Start: 2017-07-30 — End: 2017-07-30
  Administered 2017-07-30: 10 mg via INTRAVENOUS

## 2017-07-30 MED ORDER — FREE WATER
300.0000 mL | Freq: Four times a day (QID) | Status: DC
  Administered 2017-07-30 – 2017-08-03 (×15): 300 mL

## 2017-07-30 MED ORDER — LIDOCAINE HCL 2 % IJ SOLN
INTRAMUSCULAR | Status: AC
  Administered 2017-07-30: 40 mg
  Filled 2017-07-30: qty 20

## 2017-07-30 NOTE — Procedures (Signed)
Bronchoscopy Procedure Note MASAHIRO IGLESIA [id number] 1955/10/24  Procedure: Bronchoscopy Indications: Unable to pass in-line suctioning, ET tube placement quite high on chest x-ray.  Need to verify appropriate placement.  Procedure Details Consent: Unable to obtain consent because of emergent medical necessity. Time Out: Verified patient identification, verified procedure, site/side was marked, verified correct patient position, special equipment/implants available, medications/allergies/relevent history reviewed, required imaging and test results available.  Performed  In preparation for procedure, patient was given 100% FiO2, bronchoscope lubricated and lidocaine given via ETT (2 ml). Sedation: Benzodiazepines and fentanyl and precedex drip  Airway entered and the following bronchi were examined: RLL and Bronchi.   Procedures performed: Brushings performed Bronchoscope removed.    Evaluation Hemodynamic Status: BP stable throughout; O2 sats: stable throughout Patient's Current Condition: stable Specimens:  None Complications: No apparent complications Patient did tolerate procedure well.   It was quite difficult to pass the regular adult sized bronchoscope through the endotracheal tube was not because the Hollister was too tight it appeared to make a sharp band in the posterior oropharynx.  The endotracheal tube was approximately 12 cm distal to the carina.  There was mucus plugging in the right lower lobe bronchus.  30 cc of saline lavage was instilled but the suction channel was unable to aspirate any mucous plugs.  Under bronchoscopic guidance ET tube was advanced 2 cm we were limited due to the length of the endotracheal tube.  The tube was resecured and the bronchoscope was easier to insert.  And after the procedure the in line suction could be easily inserted.  Patient may benefit from a therapeutic bronchoscopy for aspiration of secretions.  Charlesetta Garibaldi 07/30/2017

## 2017-07-30 NOTE — Consult Note (Signed)
[facility] Nurse wound consult note Reason for Consult: Consult requested for buttocks and sacrum. Wound type: Dark purple black deep tissue pressure injury is located to to right buttocks and sacrum Pressure Injury POA: No Measurement: 6X6cm Wound bed: 85% DTPI, 15% has evolved into black eschar Drainage (amount, consistency, odor) small amt tan drainage, no odor Periwound: Intact skin surrounding Dressing procedure/placement/frequency: Begin Santyl for enzymatic debridement of nonviable tissue, foam dressing is in place to protect from further injury.  Pt is on a low airloss bed to reduce pressure.  No family present to discuss plan of care. Pt is critically ill and on the vent. Please re-consult if further assistance is needed.  Thank-you,  Julien Girt MSN, [place], [place], [place], [place]

## 2017-07-30 NOTE — Progress Notes (Signed)
SLP Cancellation Note  Patient Details Name: Andrew [name] MRN: [id number] DOB: 06-09-1956   Cancelled treatment:       Reason Eval/Treat Not Completed: Medical issues which prohibited therapy - pt intubated this morning. SLP to sign off for now. Please reorder when ready.   Germain Osgood 07/30/2017, 9:14 AM  Germain Osgood, M.A. CCC-SLP [phone]

## 2017-07-30 NOTE — Progress Notes (Signed)
This RT assisted Dr Derrek Gu with bedside bronch to verify ett placement, unable to pass in-line suction catheter.  Pt hyperoxygenated prior to and during procedure at 100% fio2.  Pt tolerated procedure well without incident.  ETT advanced under bronchoscopy, 2cm from 26cm to 28cm lip.  Fio2 returned to 40% per prior vent settings.  Ballard catheter now passes easily with suction.  RT will continue to monitor and assess patient.

## 2017-07-30 NOTE — Progress Notes (Signed)
Dr. Conrad [place], myself (primary RN ), charge nurse, and respiratory therapist present in room.Patient intubated without difficulty noted per MD.

## 2017-07-30 NOTE — Progress Notes (Signed)
CRITICAL VALUE ALERT  Critical Value:  Lactic Acid 2.6  Date & Time Notied:  07/30/2017  Provider Notified: Yes, Dr. Jimmy Footman  Orders Received/Actions taken: No orders received.  Will continue to monitor

## 2017-07-30 NOTE — Progress Notes (Signed)
PULMONARY  / CRITICAL CARE MEDICINE  Name: NIKAN ELLINGSON MRN: [id number] DOB: 1956-08-02    LOS: 61  REFERRING MD :  Dr. Tomi Bamberger  CHIEF COMPLAINT:  Shortness of Breath  BRIEF PATIENT DESCRIPTION:  61 yo male smoker presented to [facility] ER 12/10 with ETOH withdrawal.  Required intubation for aspiration pneumonia.  Prolonged ICU course due to agitated delirium, alcohol withdrawal.   PMHx of PAD, HTN, HLD, GERD, DM.   SUBJECTIVE:  Patient had been improving and had actually come off Precedex 12/22 Progressive tachycardia, hypertension evolving to tachypnea and loss of his mental status. Airway protection was compromised that he was intubated with hypoxemic respiratory failure early morning 12/23.   VITAL SIGNS: Temp:  [98.4 F (36.9 C)-100.9 F (38.3 C)] 99.9 F (37.7 C) (12/23 0700) Pulse Rate:  [86-142] 88 (12/23 0700) Resp:  [13-50] 23 (12/23 0700) BP: (123-227)/(53-139) 147/83 (12/23 0700) SpO2:  [90 %-100 %] 100 % (12/23 0700) FiO2 (%):  [30 %-60 %] 60 % (12/23 0700)   VENTILATOR SETTINGS: Vent Mode: PRVC FiO2 (%):  [30 %-60 %] 60 % Set Rate:  [12 bmp-18 bmp] 18 bmp Vt Set:  [620 mL] 620 mL PEEP:  [5 cmH20-6 cmH20] 5 cmH20 Plateau Pressure:  [16 cmH20-20 cmH20] 16 cmH20   INTAKE / OUTPUT: I/O last 3 completed shifts: In: 375.9 [P.O.:120; I.V.:205.9; IV Piggyback:50] Out: 2720 [Urine:2220; Stool:500]  PHYSICAL EXAMINATION: General: Ill-appearing, sedated and intubated HEENT: Poor dentition, ET tube in place, left HD catheter in place PSY: Opens eyes to voice, otherwise unable to assess Neuro:, Opens eyes to voice, moves all extremities CV: Regular, normal rate, no murmur PULM: Coarse bilateral breath sounds, no wheezing GI: Soft, benign, no apparent tenderness, positive bowel sounds Extremities: Left lower extremity BKA, no deformity, no edema Skin: No rash  LABS: Cbc Recent Labs  Lab 07/27/17 0337 07/28/17 0431 07/30/17 0600  WBC 12.3* 14.7* 24.6*  HGB  9.3* 9.6* 9.9*  HCT 28.2* 29.4* 32.5*  PLT 65* 93* 135*    Chemistry  Recent Labs  Lab 07/24/17 0445  07/27/17 0337 07/28/17 0431 07/29/17 0321 07/29/17 0500 07/30/17 0600  NA 137   < > 141 146* 150* 148* 154*  K 4.0   < > 3.2* 3.6 3.2* 3.2* 3.3*  CL 105   < > 109 114* 119* 118* 123*  CO2 27   < > 26 24 22 22 24   BUN 21*   < > 33* 39* 35* 33* 32*  CREATININE 1.67*   < > 1.61* 1.74* 1.44* 1.32* 1.41*  CALCIUM 7.6*   < > 8.3* 7.9* 7.7* 7.6* 8.1*  MG 2.1  --   --   --   --   --  1.4*  PHOS 1.5*   < > 2.6 4.7*  --  3.9  --   GLUCOSE 204*   < > 84 104* 100* 100* 120*   < > = values in this interval not displayed.    Liver fxn Recent Labs  Lab 07/28/17 0431 07/29/17 0321 07/29/17 0500  AST  --  199*  --   ALT  --  289*  --   ALKPHOS  --  176*  --   BILITOT  --  2.3*  --   PROT  --  4.9*  --   ALBUMIN 2.0* 2.1* 2.0*   coags Recent Labs  Lab 07/24/17 0445  APTT 36  INR 1.34   Sepsis markers No results for input(s): LATICACIDVEN, PROCALCITON in the  last 168 hours. Cardiac markers No results for input(s): CKTOTAL, CKMB, TROPONINI in the last 168 hours.   BNP No results for input(s): PROBNP in the last 168 hours.   ABG Recent Labs  Lab 07/29/17 2128 07/30/17 0130  PHART 7.455* 7.435  PCO2ART 31.3* 32.8  PO2ART 58.5* 52.3*  HCO3 21.6 21.7    CBG trend Recent Labs  Lab 07/29/17 1522 07/29/17 2058 07/30/17 0025 07/30/17 0300 07/30/17 0729  GLUCAP 104* 128* 147* 114* 111*    IMAGING: Dg Chest Port 1 View  Result Date: 07/30/2017 CLINICAL DATA:  Endotracheal tube advancement. EXAM: PORTABLE CHEST 1 VIEW COMPARISON:  Chest radiograph performed earlier today at 12:23 a.m. FINDINGS: The patient's endotracheal tube is seen ending 6-7 cm above the carina. Retrocardiac airspace opacification is again noted. A small left pleural effusion is suspected. No pneumothorax is seen. The cardiomediastinal silhouette is normal in size. No acute osseous abnormalities  are identified. A left-sided dual-lumen catheter is noted ending about the mid SVC. IMPRESSION: 1. Endotracheal tube seen ending 6-7 cm above the carina. 2. Retrocardiac airspace opacification again noted. Small left pleural effusion suspected. Electronically Signed   By: Garald Balding M.D.   On: 07/30/2017 05:21   Dg Chest Port 1 View  Result Date: 07/30/2017 CLINICAL DATA:  Endotracheal tube placement. EXAM: PORTABLE CHEST 1 VIEW COMPARISON:  Chest radiograph performed 07/29/2017 FINDINGS: The patient's endotracheal tube is seen ending 9-10 cm above the carina. This could be advanced 5-6 cm. Retrocardiac airspace opacification raises concern for pneumonia. A small left pleural effusion is suspected. No pneumothorax is seen. The cardiomediastinal silhouette is normal in size. No acute osseous abnormalities are identified. IMPRESSION: 1. Endotracheal tube seen ending 9-10 cm above the carina. This could be advanced 5-6 cm, as deemed clinically appropriate. 2. Persistent retrocardiac airspace opacification raises concern for pneumonia. Small left pleural effusion suspected. Electronically Signed   By: Garald Balding M.D.   On: 07/30/2017 01:05   Dg Chest Port 1 View  Result Date: 07/29/2017 CLINICAL DATA:  Acute onset of respiratory distress. EXAM: PORTABLE CHEST 1 VIEW COMPARISON:  Chest radiograph performed 07/26/2017 FINDINGS: There is persistent retrocardiac airspace opacification, with interval resolution of right-sided airspace opacity. This is concerning for underlying pneumonia. A small left pleural effusion is suspected, though the left costophrenic angle is incompletely imaged. No pneumothorax is seen. The cardiomediastinal silhouette is normal in size. No acute osseous abnormalities are seen. A left IJ dual-lumen catheter is noted ending about the proximal SVC. IMPRESSION: Persistent retrocardiac airspace opacification, with interval resolution of right-sided airspace opacity. This raises  concern for pneumonia. Suspect small left pleural effusion. Electronically Signed   By: Garald Balding M.D.   On: 07/29/2017 23:59    LINES / TUBES: ETT 12/10 >> 12/18; 12/23 >>  Lt IJ HD cath 12/15 >>  CULTURES: Sputum 12/10 >> oral flora Blood 12/10 >> negative Pneumococcal ag 12/10 >> negative Legionella ag 12/10 >> negative C diff 12/13 >> negative Sputum 12/14 >> negative C diff 12/22 >> negative  ANTIBIOTICS: Unasyn 12/10 >> 12/14 maxipime 12/14 >> 12/19 Vanc 12/14 >> 12/16  STUDIES: Renal u/s 12/13 >> small amount of ascites, normal kidneys  SIGNIFICANT EVENTS:  12/13 noted to have diarrhea bloody stools with a negative C. difficile 12/14 started CVVHD 12/18 UOP 43ml, volume status improved, levo at 78mcg, high sedation > turned off extubated  12/19 More alert, remains on 2.4 precedex  12/23 reintubated  DISCUSSION: 61 yo with ETOH withdrawal, aspiration pneumonia,  VDRF complicated by acute renal failure and septic shock.  UDS positive for THC. Prolonged ICU stay due to agitated delirium, alcohol withdrawal, high sedation needs.  Had improved to extubation and discontinuation of Precedex then decompensated, apparent recurrent alcohol withdrawal delirium 12/23 >> reintubated   ASSESSMENT / PLAN:  Acute respiratory failure with hypoxia secondary to aspiration pneumonia and acute pulmonary edema.  Reintubation 12/23 Tobacco abuse. Bilateral pleural effusions, likely related to overall volume status Question recurrent retrocardiac infiltrate, healthcare associated pneumonia 12/23 P: PRVC 8cc/kg Work on PSV as he can tolerate, but suspect that he will require tracheostomy in order to manage his withdrawal and associated recurrent respiratory failure Antibiotics for his pneumonia have been completed; now with elevated white blood cell count, recent fever, possible left lower lobe retrocardiac opacity.  Low threshold to restart antibiotics for healthcare associated  pneumonia, defer for now to obtain cultures Repeat tracheal aspirate culture 12/23, also blood and urine cx's given his leukocytosis  Septic shock 2nd to pneumonia, treated  Acute pulmonary edema, resolved Diffuse cardiac hypokinesis, EF 20-25%, question due to sepsis Hx of HTN, PACs, PAD. P: Off stress dose steroids and norepinephrine discontinued Hold his metoprolol and clonidine that were recently ordered now that he is on sedating medications.  Suspect he will need blood pressure control once we are able to make progress with his delirium and respiratory status He will need an ischemic workup for possible coronary artery disease once he is stabilized from this acute illness  Acute renal failure from hypovolemia and ATN.  Off CVVHD for several days. Serum creatinine has plateaued and output is good.  He is auto diuresing Total body volume overload Hypernatremia Hypophosphatemia Hypokalemia P: He continues to auto diurese now to the point of hypernatremia. Now that he is intubated will place G-tube, will be more aggressive with free water Continue to follow urine output with post ATN diuresis.    Transaminitis P: Recheck LFT 12/24 If remains elevated then check right upper quadrant ultrasound  Moderate protein calorie malnutrition. At Risk Aspiration  P:  N.p.o.  Anemia of critical illness and chronic disease. Thrombocytopenia 2nd to sepsis and hx of ETOH.  HIT negative 12/12.  P: Follow CBC Transfuse per ICU guidelines, goal hemoglobin greater than 7  DM type II. P:  Sliding scale insulin per protocol  Acute metabolic encephalopathy. Acute alcohol withdrawal, recurrent delirium 12/23 requiring reintubation Sedation for mechanical ventilation P: Continue scheduled Ativan. Continue Precedex Thiamine and folate completed    Family: Dr Lamonte Sakai called brother Gerald Stabs 07/24/17, left message. No family present  12/23  DVT prophylaxis - SCDs SUP - protonix Nutrition -  NPO Goals of care - Full code  Independent CC time 33 minutes   Baltazar Apo, MD, PhD 07/30/2017, 8:26 AM [place] [facility] [phone] or if no answer [phone]

## 2017-07-30 NOTE — Progress Notes (Signed)
Did not do CPT at 1524. Patient has been agitated trying to sit up in bed. RN gave medication to help calm down patient and he is resting comfortably now. Order is for Q4w/a. Will resume at 2000. RT will continue to monitor.

## 2017-07-30 NOTE — Anesthesia Procedure Notes (Addendum)
Procedure Name: Intubation Date/Time: 07/30/2017 12:19 AM Performed by: Cynda Familia, CRNA Pre-anesthesia Checklist: Patient identified, Emergency Drugs available, Suction available and Patient being monitored Patient Re-evaluated:Patient Re-evaluated prior to induction Oxygen Delivery Method: Ambu bag Preoxygenation: Pre-oxygenation with 100% oxygen Induction Type: IV induction Ventilation: Mask ventilation without difficulty Grade View: Grade I Tube type: Subglottic suction tube Tube size: 7.5 mm Number of attempts: 1 Airway Equipment and Method: Stylet and Video-laryngoscopy Placement Confirmation: ETT inserted through vocal cords under direct vision,  positive ETCO2 and breath sounds checked- equal and bilateral Secured at: 20 cm Tube secured with: tube holder. Dental Injury: Teeth and Oropharynx as per pre-operative assessment  Comments: Dr Conrad [place] present-- DR Conrad Munson IV induction--- intubation AM CRNA atraumatic--- no teeth on top and poor dentition on bottom--  Teeth and mouth unchanged with laryngoscopy--- ETT plcaed 24 cm --- DR Conrad [place] pulled back to 20 cm--- [place]  confirmed BS---   1230--- large leak--- ??? Placement--- 30 cm pressure to balloon as per RT--- ? Placement of ETT--- DR Conrad [place] notified and advanced tube to 24 cm--- bilat BS and leak resolved.   Called to ICU and arrived at 2355-- for urgent intubation--- report received from RN and  E link- Deterding MD--- DR Conrad Ebro notified via telephone [phone]) and requested that he be present for the intubation--- awaiting his arrival--- Pt with full beard and small chin--- prior intubation Fayne Mediate--  Documented a grade 3 view--- glidescope at bedside --- pt continues to be monitored--- ambu O2 and suction available

## 2017-07-30 NOTE — Progress Notes (Signed)
Physical Therapy Discharge Patient Details Name: JIMMEY HENGEL MRN: [id number] DOB: 1955/09/07 Today's Date: 07/30/2017 Time:  -     Patient discharged from PT services secondary to medical decline - will need to re-order PT to resume therapy services.   GP     Claretha Cooper 07/30/2017, 8:24 AM Tresa Endo PT [phone]

## 2017-07-30 NOTE — Progress Notes (Signed)
Notified Dr. Jimmy Footman about patient's increased respirations 40s-50s. No orders given. Will continue to monitor.

## 2017-07-30 NOTE — Anesthesia Procedure Notes (Signed)
Procedure Name: Intubation Date/Time: 07/30/2017 12:35 AM Performed by: Lillia Abed, MD Pre-anesthesia Checklist: Patient identified, Emergency Drugs available, Suction available, Patient being monitored and Timeout performed Patient Re-evaluated:Patient Re-evaluated prior to induction Oxygen Delivery Method: Ambu bag Preoxygenation: Pre-oxygenation with 100% oxygen Induction Type: IV induction Ventilation: Mask ventilation without difficulty Laryngoscope Size: Glidescope Grade View: Grade I Tube type: Subglottic suction tube Tube size: 7.5 mm Number of attempts: 1 Airway Equipment and Method: Video-laryngoscopy Placement Confirmation: CO2 detector and breath sounds checked- equal and bilateral Secured at: 24 cm Tube secured with: Rt Tube holder. Comments: Pt intubated in the ICU. Breath sounds deminished on L. CXR ordered. O2 sat 94%

## 2017-07-31 ENCOUNTER — Inpatient Hospital Stay (HOSPITAL_COMMUNITY): Payer: Medicare Other

## 2017-07-31 DIAGNOSIS — L8990 Pressure ulcer of unspecified site, unspecified stage: Secondary | ICD-10-CM

## 2017-07-31 LAB — MAGNESIUM
MAGNESIUM: 1.3 mg/dL — AB (ref 1.7–2.4)
Magnesium: 1.4 mg/dL — ABNORMAL LOW (ref 1.7–2.4)

## 2017-07-31 LAB — COMPREHENSIVE METABOLIC PANEL
ALT: 195 U/L — AB (ref 17–63)
AST: 85 U/L — AB (ref 15–41)
Albumin: 2.2 g/dL — ABNORMAL LOW (ref 3.5–5.0)
Alkaline Phosphatase: 113 U/L (ref 38–126)
Anion gap: 6 (ref 5–15)
BUN: 29 mg/dL — AB (ref 6–20)
CHLORIDE: 129 mmol/L — AB (ref 101–111)
CO2: 22 mmol/L (ref 22–32)
Calcium: 8.4 mg/dL — ABNORMAL LOW (ref 8.9–10.3)
Creatinine, Ser: 1.37 mg/dL — ABNORMAL HIGH (ref 0.61–1.24)
GFR, EST NON AFRICAN AMERICAN: 55 mL/min — AB (ref >60)
Glucose, Bld: 107 mg/dL — ABNORMAL HIGH (ref 65–99)
POTASSIUM: 3.6 mmol/L (ref 3.5–5.1)
SODIUM: 157 mmol/L — AB (ref 135–145)
Total Bilirubin: 1.8 mg/dL — ABNORMAL HIGH (ref 0.3–1.2)
Total Protein: 5 g/dL — ABNORMAL LOW (ref 6.5–8.1)

## 2017-07-31 LAB — CBC
HEMATOCRIT: 30.4 % — AB (ref 39.0–52.0)
Hemoglobin: 9.1 g/dL — ABNORMAL LOW (ref 13.0–17.0)
MCH: 28 pg (ref 26.0–34.0)
MCHC: 29.9 g/dL — ABNORMAL LOW (ref 30.0–36.0)
MCV: 93.5 fL (ref 78.0–100.0)
Platelets: 153 10*3/uL (ref 150–400)
RBC: 3.25 MIL/uL — AB (ref 4.22–5.81)
RDW: 18.4 % — ABNORMAL HIGH (ref 11.5–15.5)
WBC: 15.1 10*3/uL — AB (ref 4.0–10.5)

## 2017-07-31 LAB — GLUCOSE, CAPILLARY
GLUCOSE-CAPILLARY: 86 mg/dL (ref 65–99)
GLUCOSE-CAPILLARY: 92 mg/dL (ref 65–99)
Glucose-Capillary: 102 mg/dL — ABNORMAL HIGH (ref 65–99)
Glucose-Capillary: 98 mg/dL (ref 65–99)

## 2017-07-31 LAB — URINE CULTURE: Culture: NO GROWTH

## 2017-07-31 LAB — PHOSPHORUS
PHOSPHORUS: 2.9 mg/dL (ref 2.5–4.6)
Phosphorus: 3.3 mg/dL (ref 2.5–4.6)

## 2017-07-31 MED ORDER — VITAL HIGH PROTEIN PO LIQD
1000.0000 mL | ORAL | Status: DC
  Filled 2017-07-31: qty 1000

## 2017-07-31 MED ORDER — ACETAMINOPHEN 160 MG/5ML PO SOLN
650.0000 mg | Freq: Four times a day (QID) | ORAL | Status: DC | PRN
  Administered 2017-07-31 – 2017-08-02 (×5): 650 mg
  Filled 2017-07-31 (×5): qty 20.3

## 2017-07-31 MED ORDER — DEXTROSE 5 % IV SOLN
INTRAVENOUS | Status: DC
  Administered 2017-07-31: 13:00:00 via INTRAVENOUS

## 2017-07-31 MED ORDER — CARVEDILOL 3.125 MG PO TABS
3.1250 mg | ORAL_TABLET | Freq: Two times a day (BID) | ORAL | Status: DC
  Administered 2017-07-31 – 2017-08-02 (×5): 3.125 mg
  Filled 2017-07-31 (×5): qty 1

## 2017-07-31 MED ORDER — MAGNESIUM SULFATE 2 GM/50ML IV SOLN
2.0000 g | Freq: Once | INTRAVENOUS | Status: AC
  Administered 2017-07-31: 2 g via INTRAVENOUS
  Filled 2017-07-31: qty 50

## 2017-07-31 MED ORDER — LORAZEPAM 1 MG PO TABS
1.0000 mg | ORAL_TABLET | Freq: Four times a day (QID) | ORAL | Status: DC
  Administered 2017-07-31 – 2017-08-03 (×12): 1 mg
  Filled 2017-07-31 (×12): qty 1

## 2017-07-31 MED ORDER — PANTOPRAZOLE SODIUM 40 MG PO PACK
40.0000 mg | PACK | Freq: Every day | ORAL | Status: DC
  Administered 2017-07-31 – 2017-08-02 (×3): 40 mg
  Filled 2017-07-31 (×3): qty 20

## 2017-07-31 MED ORDER — HEPARIN SODIUM (PORCINE) 5000 UNIT/ML IJ SOLN
5000.0000 [IU] | Freq: Three times a day (TID) | INTRAMUSCULAR | Status: DC
  Administered 2017-07-31 – 2017-08-08 (×24): 5000 [IU] via SUBCUTANEOUS
  Filled 2017-07-31 (×24): qty 1

## 2017-07-31 MED ORDER — VITAL AF 1.2 CAL PO LIQD
1000.0000 mL | ORAL | Status: DC
  Administered 2017-07-31 – 2017-08-01 (×2): 1000 mL
  Filled 2017-07-31 (×5): qty 1000

## 2017-07-31 MED ORDER — PRO-STAT SUGAR FREE PO LIQD: 30.0000 mL | Freq: Two times a day (BID) | ORAL | Status: DC

## 2017-07-31 NOTE — Progress Notes (Signed)
Nutrition Follow-up  DOCUMENTATION CODES:   Not applicable  INTERVENTION:   Vital AF 1.2 @ 60 ml/hr Provides: 1728 kcals, 108 grams protein, 1166 ml free water. Meets 106% of calorie needs and 100% of protein needs.  **Needs adjusted for new vent setting** **Monitor and supplement electrolytes as needed per MD descretion.**   NUTRITION DIAGNOSIS:   Inadequate oral intake related to inability to eat as evidenced by NPO status.  Ongoing  GOAL:   Patient will meet greater than or equal to 90% of their needs  Not meeting  MONITOR:   Diet advancement, Weight trends, Labs, Other (Comment)(Any plan concerning nutrition)  REASON FOR ASSESSMENT:   Consult Enteral/tube feeding initiation and management  ASSESSMENT:   Pt with PMH significant for alcohol abuse DM, HLD, HTN, PAD, squamous cell cancer of the lip, and tobacco abuse. Presents this admission with alcohol withdrawal and aspiration pneumonia with resulting acute respiratory failure.    12/14- pt started on CRRT 12/19- pt extubated, OGT removed, CRRT stopped 12/22- loss of mental status, progressive tachycardia, pt re-intubated, OGT in place 12/23- bronchoscopy due to inability to pass in-line suctioning  RD consulted for tube feeding management s/p re-intubation. Pt started on PePup protocol. Re-estimated needs for new vent setting. Weight continuing to trend down, 23 lb since last RD visit 12/19. Will continue to monitor trends.    Patient is currently intubated on ventilator support MV: 8.8 L/min Temp (24hrs), Avg:99.9 F (37.7 C), Min:98.4 F (36.9 C), Max:101.5 F (38.6 C) BP: 160/67 MAP: 98 Propofol: None  Medications reviewed and include: SSI, precedex, D5 @ 50 ml/hr Labs reviewed: Na 157 (H) BUN 29 (H) Creatinine 1.37 (H) Mg 1.4 (L) AST 85 (H) ALT 195 (H)   Diet Order:  Diet NPO time specified  EDUCATION NEEDS:   Not appropriate for education at this time  Skin:  Skin Assessment: Reviewed RN  Assessment  Last BM:  50 ml- flexiseal  Height:   Ht Readings from Last 1 Encounters:  07/30/17 6' (1.829 m)    Weight:   Wt Readings from Last 1 Encounters:  07/31/17 144 lb 13.5 oz (65.7 kg)    Ideal Body Weight:  80.91 kg  BMI:  Body mass index is 19.64 kg/m.  Estimated Nutritional Needs:   Kcal:  1623 kcal (PSU)  Protein:  98-112 grams (1.5-1.7 g/kg)  Fluid:  >1.6 L/day    Mariana Single RD, LDN Clinical Nutrition Pager # - [phone]

## 2017-07-31 NOTE — Progress Notes (Signed)
Date: July 31, 2017 Velva Harman, BSN, [place], [place] Chart and notes review for patient progress and needs. Will follow for case management and discharge needs. Next review date: [id number]

## 2017-07-31 NOTE — Progress Notes (Signed)
[facility] PCCM PROGRESS NOTE  Date of Admission: 07/17/2017 Referring Provider:  Dr. Tomi Bamberger, ER Chief Complaint: Short of breath  Summary: 61 yo male smoker presented with alcohol withdrawal and required intubation for aspiration pneumonia.  Past Medical History: PAD, HTN, HLD, GERD, DM  Subjective: Sedated.  Vital signs: BP (!) 170/71   Pulse 81   Temp 99.3 F (37.4 C)   Resp 18   Ht 6' (1.829 m)   Wt 144 lb 13.5 oz (65.7 kg)   SpO2 98%   BMI 19.64 kg/m   Intake/Output: I/O last 3 completed shifts: In: 1463.5 [I.V.:453.5; Other:110; NG/GT:900] Out: 1465 [Urine:1115; Stool:350]  Physical Exam:  General - sedated Eyes - pupils reactive ENT - ETT in place Cardiac - regular, no murmur Chest - no wheeze, rales Abd - soft, non tender Ext - Lt BKA, no edema Skin - sacral wound Neuro - normal strength Psych - normal mood  Discussion: 61 yo male smoker with aspiration pneumonia in setting of ETOH withdrawal.  UDS positive for THC.  Has prolonged difficulty with delirium.  Assessment/Plan:  Acute hypoxic respiratory failure from aspiration pneumonia and mucus plugging. - full vent support - f/u CXR  Acute systolic CHF. Valvular heart disease. Hx of HTN, PAD. - start coreg - prn hydralazine - will need cardiology assessment when more stable  Hypernatremia. - continue free water - add D5W at 50 ml/hr  Sacral, buttock pressure ulcers. - wound care  Moderate protein calorie malnutrition. - tube feeds  Anemia of critical illness and chronic disease. Thrombocytopenia 2nd to sepsis and ETOH. - HIT panel negative 12/12 - f/u CBC  DM type II. - SSI   Acute metabolic encephalopathy with ETOH withdrawal. - RASS goal 0 to -1 - scheduled ativan - prn haldol  DVT prophylaxis: SQ heparin SUP: Protonix Diet: Tube feeds Goals of care: Full code  CC time 32 minutes  Chesley Mires, MD [facility] Pulmonary/Critical Care 07/31/2017, 11:07 AM Pager:   [phone] After 3pm call: [phone]  FLOW SHEET  Cultures: Sputum 12/10 >> oral flora Blood 12/10 >> negative Pneumococcal ag 12/10 >> negative Legionella ag 12/10 >> negative C diff 12/13 >> negative Sputum 12/14 >> negative C diff 12/22 >> negative  Antibiotics: Unasyn 12/10 >> 12/14 maxipime 12/14 >> 12/19 Vanc 12/14 >> 12/16  Studies: Renal u/s 12/13 >> small amount of ascites, normal kidneys Echo 12/16 >> mild LVH, EF 20 to 25%, grade 2 DD, mod AR, mild/mod MR  Events: 12/10 Admit 12/13 noted to have diarrhea bloody stools with a negative C. difficile 12/14 started CVVHD 12/18 UOP 432ml, volume status improved, levo at 40mcg, high sedation > turned off extubated  12/19 More alert, remains on 2.4 precedex  12/23 reintubated, bronch for mucus plugging  Lines/tubes: ETT 12/10 >> 12/18; 12/23 >>  Lt IJ HD cath 12/15 >>  Consults: 12/14 Renal, s/o 12/22 12/23 Wound care  Resolved Problems: Septic shock, relative adrenal insufficiency, aspiration pneumonia, acute pulmonary edema, acute renal failure with ATN  Labs: CMP Latest Ref Rng & Units 07/31/2017 07/30/2017 07/29/2017  Glucose 65 - 99 mg/dL 107(H) 120(H) 100(H)  BUN 6 - 20 mg/dL 29(H) 32(H) 33(H)  Creatinine 0.61 - 1.24 mg/dL 1.37(H) 1.41(H) 1.32(H)  Sodium 135 - 145 mmol/L 157(H) 154(H) 148(H)  Potassium 3.5 - 5.1 mmol/L 3.6 3.3(L) 3.2(L)  Chloride 101 - 111 mmol/L 129(H) 123(H) 118(H)  CO2 22 - 32 mmol/L 22 24 22   Calcium 8.9 - 10.3 mg/dL 8.4(L) 8.1(L) 7.6(L)  Total Protein  6.5 - 8.1 g/dL 5.0(L) - -  Total Bilirubin 0.3 - 1.2 mg/dL 1.8(H) - -  Alkaline Phos 38 - 126 U/L 113 - -  AST 15 - 41 U/L 85(H) - -  ALT 17 - 63 U/L 195(H) - -    CBC Latest Ref Rng & Units 07/31/2017 07/30/2017 07/28/2017  WBC 4.0 - 10.5 K/uL 15.1(H) 24.6(H) 14.7(H)  Hemoglobin 13.0 - 17.0 g/dL 9.1(L) 9.9(L) 9.6(L)  Hematocrit 39.0 - 52.0 % 30.4(L) 32.5(L) 29.4(L)  Platelets 150 - 400 K/uL 153 135(L) 93(L)    CBG (last 3)   Recent Labs    07/30/17 1954 07/31/17 0404 07/31/17 0730  GLUCAP 118* 102* 98    Imaging: Dg Abd 1 View  Result Date: 07/30/2017 CLINICAL DATA:  61 year old male status post nasogastric tube placement. EXAM: ABDOMEN - 1 VIEW COMPARISON:  Prior x-ray 07/30/2017 FINDINGS: The nasogastric tube is been advanced. The tip of the tube now overlies the gastric fundus. The proximal side hole is in the region of the gastroesophageal junction. Stable left lower lobe opacity. Unremarkable bowel gas pattern. IMPRESSION: 1. The tip of the nasogastric tube now overlies the gastric fundus. Electronically Signed   By: Jacqulynn Cadet M.D.   On: 07/30/2017 14:15   Dg Abd 1 View  Result Date: 07/30/2017 CLINICAL DATA:  Nasogastric tube placement EXAM: ABDOMEN - 1 VIEW COMPARISON:  07/30/2017 at 9:03 a.m. FINDINGS: The patient is rotated to the left on today's radiograph, reducing diagnostic sensitivity and specificity. Nasogastric tube tip projects over the expected location of the stomach with the side port in the distal esophagus. Left retrocardiac airspace opacity IMPRESSION: 1. NGT tip projects over the expected location of the stomach with side port in the distal esophagus, this is not appreciably changed from the earlier exam. 2. Suspected left retrocardiac airspace opacity. Electronically Signed   By: Van Clines M.D.   On: 07/30/2017 12:36   Dg Chest Port 1 View  Result Date: 07/31/2017 CLINICAL DATA:  Acute respiratory failure.  Ventilator support. EXAM: PORTABLE CHEST 1 VIEW COMPARISON:  07/30/2017 FINDINGS: Patient rotated towards the left. Endotracheal tube tip 4 cm above the carina. Nasogastric tube enters the abdomen. Central line unchanged in the SVC. Right lung remains clear. Persistent left lower lobe atelectasis. IMPRESSION: Persistent left lower lobe atelectasis. Electronically Signed   By: Nelson Chimes M.D.   On: 07/31/2017 06:40   Dg Chest Port 1 View  Result Date:  07/30/2017 CLINICAL DATA:  Endotracheal tube advancement. EXAM: PORTABLE CHEST 1 VIEW COMPARISON:  Chest radiograph performed earlier today at 12:23 a.m. FINDINGS: The patient's endotracheal tube is seen ending 6-7 cm above the carina. Retrocardiac airspace opacification is again noted. A small left pleural effusion is suspected. No pneumothorax is seen. The cardiomediastinal silhouette is normal in size. No acute osseous abnormalities are identified. A left-sided dual-lumen catheter is noted ending about the mid SVC. IMPRESSION: 1. Endotracheal tube seen ending 6-7 cm above the carina. 2. Retrocardiac airspace opacification again noted. Small left pleural effusion suspected. Electronically Signed   By: Garald Balding M.D.   On: 07/30/2017 05:21   Dg Chest Port 1 View  Result Date: 07/30/2017 CLINICAL DATA:  Endotracheal tube placement. EXAM: PORTABLE CHEST 1 VIEW COMPARISON:  Chest radiograph performed 07/29/2017 FINDINGS: The patient's endotracheal tube is seen ending 9-10 cm above the carina. This could be advanced 5-6 cm. Retrocardiac airspace opacification raises concern for pneumonia. A small left pleural effusion is suspected. No pneumothorax is seen. The cardiomediastinal  silhouette is normal in size. No acute osseous abnormalities are identified. IMPRESSION: 1. Endotracheal tube seen ending 9-10 cm above the carina. This could be advanced 5-6 cm, as deemed clinically appropriate. 2. Persistent retrocardiac airspace opacification raises concern for pneumonia. Small left pleural effusion suspected. Electronically Signed   By: Garald Balding M.D.   On: 07/30/2017 01:05   Dg Chest Port 1 View  Result Date: 07/29/2017 CLINICAL DATA:  Acute onset of respiratory distress. EXAM: PORTABLE CHEST 1 VIEW COMPARISON:  Chest radiograph performed 07/26/2017 FINDINGS: There is persistent retrocardiac airspace opacification, with interval resolution of right-sided airspace opacity. This is concerning for  underlying pneumonia. A small left pleural effusion is suspected, though the left costophrenic angle is incompletely imaged. No pneumothorax is seen. The cardiomediastinal silhouette is normal in size. No acute osseous abnormalities are seen. A left IJ dual-lumen catheter is noted ending about the proximal SVC. IMPRESSION: Persistent retrocardiac airspace opacification, with interval resolution of right-sided airspace opacity. This raises concern for pneumonia. Suspect small left pleural effusion. Electronically Signed   By: Garald Balding M.D.   On: 07/29/2017 23:59   Dg Abd Portable 1v  Result Date: 07/30/2017 CLINICAL DATA:  61 year old male status post gastric tube placed EXAM: PORTABLE ABDOMEN - 1 VIEW COMPARISON:  None. FINDINGS: A gastric tube is been placed. The tip of the tube these in the upper stomach and the proximal side hole in the distal esophagus. Recommend advancing approximately 10 cm for more optimal placement. The patient is significantly rotated toward the left. Dense left basilar opacity may represent atelectasis or infiltrate. Left IJ approach tunneled hemodialysis catheter is again present. IMPRESSION: The tip of the gastric tube is just beyond the gastroesophageal junction. Recommend advancing 8-10 cm for more optimal placement. Electronically Signed   By: Jacqulynn Cadet M.D.   On: 07/30/2017 09:34

## 2017-07-31 NOTE — Progress Notes (Signed)
1700 Magnesium level was 1.3 mg/dl.  This RN Informed Dr. Milon Dikes at [facility].  2grams of mag sulfate ordered.  Irven Baltimore, RN

## 2017-08-01 ENCOUNTER — Inpatient Hospital Stay (HOSPITAL_COMMUNITY): Payer: Medicare Other

## 2017-08-01 LAB — MAGNESIUM
MAGNESIUM: 1.7 mg/dL (ref 1.7–2.4)
Magnesium: 1.5 mg/dL — ABNORMAL LOW (ref 1.7–2.4)

## 2017-08-01 LAB — GLUCOSE, CAPILLARY
GLUCOSE-CAPILLARY: 113 mg/dL — AB (ref 65–99)
GLUCOSE-CAPILLARY: 137 mg/dL — AB (ref 65–99)
GLUCOSE-CAPILLARY: 102 mg/dL — AB (ref 65–99)
Glucose-Capillary: 118 mg/dL — ABNORMAL HIGH (ref 65–99)
Glucose-Capillary: 120 mg/dL — ABNORMAL HIGH (ref 65–99)
Glucose-Capillary: 124 mg/dL — ABNORMAL HIGH (ref 65–99)
Glucose-Capillary: 136 mg/dL — ABNORMAL HIGH (ref 65–99)
Glucose-Capillary: 89 mg/dL (ref 65–99)

## 2017-08-01 LAB — BASIC METABOLIC PANEL
Anion gap: 6 (ref 5–15)
BUN: 26 mg/dL — AB (ref 6–20)
CHLORIDE: 122 mmol/L — AB (ref 101–111)
CO2: 22 mmol/L (ref 22–32)
CREATININE: 1.2 mg/dL (ref 0.61–1.24)
Calcium: 7.9 mg/dL — ABNORMAL LOW (ref 8.9–10.3)
GFR calc Af Amer: >60 mL/min (ref >60)
Glucose, Bld: 130 mg/dL — ABNORMAL HIGH (ref 65–99)
Potassium: 3 mmol/L — ABNORMAL LOW (ref 3.5–5.1)
SODIUM: 150 mmol/L — AB (ref 135–145)

## 2017-08-01 LAB — CBC
HCT: 28.2 % — ABNORMAL LOW (ref 39.0–52.0)
Hemoglobin: 8.5 g/dL — ABNORMAL LOW (ref 13.0–17.0)
MCH: 28.1 pg (ref 26.0–34.0)
MCHC: 30.1 g/dL (ref 30.0–36.0)
MCV: 93.1 fL (ref 78.0–100.0)
PLATELETS: 188 10*3/uL (ref 150–400)
RBC: 3.03 MIL/uL — ABNORMAL LOW (ref 4.22–5.81)
RDW: 18.5 % — AB (ref 11.5–15.5)
WBC: 14.3 10*3/uL — AB (ref 4.0–10.5)

## 2017-08-01 LAB — PHOSPHORUS
Phosphorus: 2.8 mg/dL (ref 2.5–4.6)
Phosphorus: 3.2 mg/dL (ref 2.5–4.6)

## 2017-08-01 MED ORDER — MAGNESIUM SULFATE 2 GM/50ML IV SOLN
2.0000 g | Freq: Once | INTRAVENOUS | Status: AC
  Administered 2017-08-01: 2 g via INTRAVENOUS
  Filled 2017-08-01: qty 50

## 2017-08-01 MED ORDER — CHLORHEXIDINE GLUCONATE CLOTH 2 % EX PADS
6.0000 | MEDICATED_PAD | Freq: Every day | CUTANEOUS | Status: DC
  Administered 2017-08-02: 6 via TOPICAL

## 2017-08-01 MED ORDER — POTASSIUM CHLORIDE 20 MEQ/15ML (10%) PO SOLN
40.0000 meq | ORAL | Status: AC
  Administered 2017-08-01 (×2): 40 meq
  Filled 2017-08-01 (×2): qty 30

## 2017-08-01 NOTE — Progress Notes (Signed)
[facility] Progress Note Patient Name: Andrew [name] DOB: [birth year] MRN: [id number]   Date of Service  08/01/2017  HPI/Events of Note  Mod low mag  eICU Interventions  Mag replaced     Intervention Category Intermediate Interventions: Electrolyte abnormality - evaluation and management  Tiane Szydlowski 08/01/2017, 7:54 PM

## 2017-08-01 NOTE — Progress Notes (Signed)
[facility] PCCM PROGRESS NOTE  Date of Admission: 07/17/2017 Referring Provider:  Dr. Tomi Bamberger, ER Chief Complaint: Short of breath  Summary: 61 yo male smoker presented with alcohol withdrawal and required intubation for aspiration pneumonia.  Past Medical History: PAD, HTN, HLD, GERD, DM  Subjective: Low Vt on SBT.  Vital signs: BP (!) 147/63   Pulse 65   Temp 99.5 F (37.5 C)   Resp (!) 23   Ht 6' (1.829 m)   Wt 150 lb 9.2 oz (68.3 kg)   SpO2 100%   BMI 20.42 kg/m   Intake/Output: I/O last 3 completed shifts: In: 4268 [I.V.:1493.3; Other:260; NG/GT:2619.7; IV Piggyback:50] Out: 1740 [Urine:1595; Stool:145]  Physical Exam:  General - sedated Eyes - pupils reactive, wearing glasses ENT - ETT in place Cardiac - regular, no murmur Chest - no wheeze, rales Abd - soft, non tender Ext - Lt BKA Skin - sacral wound Neuro - RASS -1  Discussion: 61 yo male smoker with aspiration pneumonia in setting of ETOH withdrawal.  UDS positive for THC.  Has prolonged difficulty with delirium.  Assessment/Plan:  Acute hypoxic respiratory failure from aspiration pneumonia and mucus plugging. - pressure support wean as able - f/u CXR intermittently - might need trach to assist with vent weaning  Acute systolic CHF. Valvular heart disease. Hx of HTN, PAD. - continue coreg - prn hydralazine - will need cardiology assessment when more stable  Hypernatremia. Hypokalemia. Hypomagnesemia. - continue free water - D5W at 50 ml/hr - replace electrolytes as needed  Sacral, buttock pressure ulcers. - wound care  Moderate protein calorie malnutrition. - tube feeds  Anemia of critical illness and chronic disease. Thrombocytopenia 2nd to sepsis and ETOH. - HIT panel negative 12/12 - f/u CBC  DM type II. - SSI   Acute metabolic encephalopathy with ETOH withdrawal. - RASS goal 0 to -1 - scheduled ativan - prn haldol  DVT prophylaxis: SQ heparin SUP: Protonix Diet: Tube  feeds Goals of care: Full code   Chesley Mires, MD [facility] Pulmonary/Critical Care 08/01/2017, 9:33 AM Pager:  [phone] After 3pm call: [phone]  FLOW SHEET  Cultures: Sputum 12/10 >> oral flora Blood 12/10 >> negative Pneumococcal ag 12/10 >> negative Legionella ag 12/10 >> negative C diff 12/13 >> negative Sputum 12/14 >> negative C diff 12/22 >> negative  Antibiotics: Unasyn 12/10 >> 12/14 maxipime 12/14 >> 12/19 Vanc 12/14 >> 12/16  Studies: Renal u/s 12/13 >> small amount of ascites, normal kidneys Echo 12/16 >> mild LVH, EF 20 to 25%, grade 2 DD, mod AR, mild/mod MR  Events: 12/10 Admit 12/13 noted to have diarrhea bloody stools with a negative C. difficile 12/14 started CVVHD 12/18 UOP 420ml, volume status improved, levo at 32mcg, high sedation > turned off extubated  12/19 More alert, remains on 2.4 precedex  12/23 reintubated, bronch for mucus plugging  Lines/tubes: ETT 12/10 >> 12/18; 12/23 >>  Lt IJ HD cath 12/15 >>  Consults: 12/14 Renal, s/o 12/22 12/23 Wound care  Resolved Problems: Septic shock, relative adrenal insufficiency, aspiration pneumonia, acute pulmonary edema, acute renal failure with ATN  Labs: CMP Latest Ref Rng & Units 08/01/2017 07/31/2017 07/30/2017  Glucose 65 - 99 mg/dL 130(H) 107(H) 120(H)  BUN 6 - 20 mg/dL 26(H) 29(H) 32(H)  Creatinine 0.61 - 1.24 mg/dL 1.20 1.37(H) 1.41(H)  Sodium 135 - 145 mmol/L 150(H) 157(H) 154(H)  Potassium 3.5 - 5.1 mmol/L 3.0(L) 3.6 3.3(L)  Chloride 101 - 111 mmol/L 122(H) 129(H) 123(H)  CO2 22 - 32  mmol/L 22 22 24   Calcium 8.9 - 10.3 mg/dL 7.9(L) 8.4(L) 8.1(L)  Total Protein 6.5 - 8.1 g/dL - 5.0(L) -  Total Bilirubin 0.3 - 1.2 mg/dL - 1.8(H) -  Alkaline Phos 38 - 126 U/L - 113 -  AST 15 - 41 U/L - 85(H) -  ALT 17 - 63 U/L - 195(H) -    CBC Latest Ref Rng & Units 08/01/2017 07/31/2017 07/30/2017  WBC 4.0 - 10.5 K/uL 14.3(H) 15.1(H) 24.6(H)  Hemoglobin 13.0 - 17.0 g/dL 8.5(L) 9.1(L) 9.9(L)   Hematocrit 39.0 - 52.0 % 28.2(L) 30.4(L) 32.5(L)  Platelets 150 - 400 K/uL 188 153 135(L)    CBG (last 3)  Recent Labs    08/01/17 0002 08/01/17 0421 08/01/17 0749  GLUCAP 136* 118* 120*    Imaging: Dg Abd 1 View  Result Date: 07/30/2017 CLINICAL DATA:  61 year old male status post nasogastric tube placement. EXAM: ABDOMEN - 1 VIEW COMPARISON:  Prior x-ray 07/30/2017 FINDINGS: The nasogastric tube is been advanced. The tip of the tube now overlies the gastric fundus. The proximal side hole is in the region of the gastroesophageal junction. Stable left lower lobe opacity. Unremarkable bowel gas pattern. IMPRESSION: 1. The tip of the nasogastric tube now overlies the gastric fundus. Electronically Signed   By: Jacqulynn Cadet M.D.   On: 07/30/2017 14:15   Dg Abd 1 View  Result Date: 07/30/2017 CLINICAL DATA:  Nasogastric tube placement EXAM: ABDOMEN - 1 VIEW COMPARISON:  07/30/2017 at 9:03 a.m. FINDINGS: The patient is rotated to the left on today's radiograph, reducing diagnostic sensitivity and specificity. Nasogastric tube tip projects over the expected location of the stomach with the side port in the distal esophagus. Left retrocardiac airspace opacity IMPRESSION: 1. NGT tip projects over the expected location of the stomach with side port in the distal esophagus, this is not appreciably changed from the earlier exam. 2. Suspected left retrocardiac airspace opacity. Electronically Signed   By: Van Clines M.D.   On: 07/30/2017 12:36   Dg Chest Port 1 View  Result Date: 08/01/2017 CLINICAL DATA:  Respiratory failure EXAM: PORTABLE CHEST 1 VIEW COMPARISON:  07/31/2017 FINDINGS: Support devices are stable. Mild cardiomegaly. Left lower lobe airspace opacity. Minimal right base atelectasis. IMPRESSION: Continued left retrocardiac atelectasis or pneumonia. Minimal right base atelectasis. Electronically Signed   By: Rolm Baptise M.D.   On: 08/01/2017 07:08   Dg Chest Port 1  View  Result Date: 07/31/2017 CLINICAL DATA:  Acute respiratory failure.  Ventilator support. EXAM: PORTABLE CHEST 1 VIEW COMPARISON:  07/30/2017 FINDINGS: Patient rotated towards the left. Endotracheal tube tip 4 cm above the carina. Nasogastric tube enters the abdomen. Central line unchanged in the SVC. Right lung remains clear. Persistent left lower lobe atelectasis. IMPRESSION: Persistent left lower lobe atelectasis. Electronically Signed   By: Nelson Chimes M.D.   On: 07/31/2017 06:40

## 2017-08-01 NOTE — Progress Notes (Signed)
[facility] Progress Note Patient Name: Andrew [name] DOB: 01/01/1956 MRN: [id number]   Date of Service  08/01/2017  HPI/Events of Note  Hypokalemia and hypomag  eICU Interventions  Potassium and mag replaced     Intervention Category Intermediate Interventions: Electrolyte abnormality - evaluation and management  Lekha Dancer 08/01/2017, 6:23 AM

## 2017-08-01 NOTE — Progress Notes (Deleted)
Pt's bed noted to be wet; RN assessed urinary catheter and noted leaking at insertion site, also noted by night shift RN.  Urinary catheter removed and replaced with a 16 fr.  1700 cc of clear, yellow, urine, with a small blood clot returned.    Will continue to monitor.

## 2017-08-02 LAB — CBC
HEMATOCRIT: 29.1 % — AB (ref 39.0–52.0)
Hemoglobin: 9.1 g/dL — ABNORMAL LOW (ref 13.0–17.0)
MCH: 28.6 pg (ref 26.0–34.0)
MCHC: 31.3 g/dL (ref 30.0–36.0)
MCV: 91.5 fL (ref 78.0–100.0)
Platelets: 193 10*3/uL (ref 150–400)
RBC: 3.18 MIL/uL — ABNORMAL LOW (ref 4.22–5.81)
RDW: 18.4 % — AB (ref 11.5–15.5)
WBC: 17.9 10*3/uL — ABNORMAL HIGH (ref 4.0–10.5)

## 2017-08-02 LAB — GLUCOSE, CAPILLARY
GLUCOSE-CAPILLARY: 121 mg/dL — AB (ref 65–99)
GLUCOSE-CAPILLARY: 124 mg/dL — AB (ref 65–99)
GLUCOSE-CAPILLARY: 98 mg/dL (ref 65–99)
Glucose-Capillary: 116 mg/dL — ABNORMAL HIGH (ref 65–99)
Glucose-Capillary: 153 mg/dL — ABNORMAL HIGH (ref 65–99)
Glucose-Capillary: 128 mg/dL — ABNORMAL HIGH (ref 65–99)
Glucose-Capillary: 123 mg/dL — ABNORMAL HIGH (ref 65–99)

## 2017-08-02 LAB — BASIC METABOLIC PANEL
Anion gap: 3 — ABNORMAL LOW (ref 5–15)
BUN: 27 mg/dL — AB (ref 6–20)
CHLORIDE: 123 mmol/L — AB (ref 101–111)
CO2: 21 mmol/L — AB (ref 22–32)
Calcium: 7.7 mg/dL — ABNORMAL LOW (ref 8.9–10.3)
Creatinine, Ser: 1.17 mg/dL (ref 0.61–1.24)
GFR calc Af Amer: >60 mL/min (ref >60)
GLUCOSE: 146 mg/dL — AB (ref 65–99)
POTASSIUM: 3.4 mmol/L — AB (ref 3.5–5.1)
Sodium: 147 mmol/L — ABNORMAL HIGH (ref 135–145)

## 2017-08-02 LAB — CULTURE, RESPIRATORY W GRAM STAIN: Culture: NORMAL

## 2017-08-02 LAB — CULTURE, RESPIRATORY

## 2017-08-02 MED ORDER — PRO-STAT SUGAR FREE PO LIQD
30.0000 mL | Freq: Two times a day (BID) | ORAL | Status: DC
  Administered 2017-08-02 (×2): 30 mL
  Filled 2017-08-02 (×2): qty 30

## 2017-08-02 MED ORDER — VITAL 1.5 CAL PO LIQD
1000.0000 mL | ORAL | Status: DC
  Administered 2017-08-02: 1000 mL
  Filled 2017-08-02 (×2): qty 1000

## 2017-08-02 MED ORDER — DEXMEDETOMIDINE HCL IN NACL 200 MCG/50ML IV SOLN
0.0000 ug/kg/h | INTRAVENOUS | Status: AC
  Administered 2017-08-02 – 2017-08-03 (×12): 1.2 ug/kg/h via INTRAVENOUS
  Administered 2017-08-03: 0.5 ug/kg/h via INTRAVENOUS
  Administered 2017-08-03 (×3): 1.2 ug/kg/h via INTRAVENOUS
  Administered 2017-08-04: 1 ug/kg/h via INTRAVENOUS
  Administered 2017-08-04: 0.6 ug/kg/h via INTRAVENOUS
  Administered 2017-08-04: 0.5 ug/kg/h via INTRAVENOUS
  Administered 2017-08-04 (×2): 0.6 ug/kg/h via INTRAVENOUS
  Filled 2017-08-02 (×21): qty 50

## 2017-08-02 NOTE — Progress Notes (Signed)
[facility] PCCM PROGRESS NOTE  Date of Admission: 07/17/2017 Referring Provider:  Dr. Tomi Bamberger, ER Chief Complaint: Short of breath  HPI: 61 yo male smoker presented with alcohol withdrawal and required intubation for aspiration pneumonia.  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  Subjective: Had fever overnight.  Didn't tolerate SBT >> tachypnea.  Vital signs: BP (!) 93/57 (BP Location: Right Arm)   Pulse 73   Temp (!) 101.3 F (38.5 C) (Bladder)   Resp (!) 22   Ht 6' (1.829 m)   Wt 150 lb 12.7 oz (68.4 kg)   SpO2 100%   BMI 20.45 kg/m   Intake/Output: I/O last 3 completed shifts: In: 6218.8 [I.V.:2562.2; Other:150; OE/UM:3536.1; IV Piggyback:50] Out: 4431 [Urine:1765; Stool:20]  Physical Exam:  General - sedated Eyes - pupils reactive ENT - ETT in place Cardiac - regular, no murmur Chest - scattered rhonchi Abd - soft, non tender Ext - Lt BKA Skin - no rashes Neuro - RASS 0  Discussion: 61 yo male smoker with aspiration pneumonia in setting of ETOH withdrawal.  UDS positive for THC.  Has prolonged difficulty with delirium.  Assessment/Plan:  Acute hypoxic respiratory failure from aspiration pneumonia and mucus plugging. - pressure support wean as able - not ready for extubation trial - f/u CXR - might need trach  Fever 12/26. - d/c HD cath - repeat cultures - hold Abx for now  Acute systolic CHF. Valvular heart disease. Hx of HTN, PAD. - continue coreg - prn hydralazine - consult cardiology once he is more  stable  Hypernatremia. Hypokalemia. Hypomagnesemia. - continue free water - D5W at 50 ml/hr - replace electrolytes as needed  Sacral, buttock pressure ulcers. - wound care  Moderate protein calorie malnutrition. - tube feeds  Anemia of critical illness and chronic disease. - f/u CBC  DM type II. - SSI   Acute metabolic encephalopathy with ETOH withdrawal. - RASS goal 0 to -1 - scheduled ativan - prn haldol  DVT prophylaxis: SQ heparin SUP: Protonix Diet: Tube feeds Goals of care: Full code   Chesley Mires, MD [facility] Pulmonary/Critical Care 08/02/2017, 9:17 AM Pager:  [phone] After 3pm call: [phone]  FLOW SHEET  Cultures: Sputum 12/10 >> oral flora Blood 12/10 >> negative Pneumococcal ag 12/10 >> negative Legionella ag 12/10 >> negative C diff 12/13 >> negative Sputum 12/14 >> negative C diff 12/22 >> negative Blood 12/26 >> Urine 12/26 >> Sputum 12/26 >>  Antibiotics: Unasyn 12/10 >> 12/14 maxipime 12/14 >> 12/19 Vanc 12/14 >> 12/16  Studies: Renal u/s 12/13 >> small amount of ascites, normal kidneys Echo 12/16 >> mild LVH, EF 20 to 25%, grade 2 DD, mod AR, mild/mod MR  Events: 12/10 Admit 12/13 noted to have diarrhea bloody stools with a negative C. difficile 12/14 started CVVHD 12/18 UOP 457ml, volume status improved, levo at 65mcg, high sedation > turned off extubated  12/19 More alert, remains on 2.4 precedex  12/23 reintubated, bronch for mucus plugging 12/26 Fever, Tm 102.7  Lines/tubes: ETT 12/10 >> 12/18; 12/23 >>  Lt IJ HD cath 12/15 >>  Consults: 12/14 Renal, s/o 12/22 12/23 Wound care  Resolved Problems: Septic shock, relative adrenal insufficiency, aspiration pneumonia, acute pulmonary  edema, acute renal failure with ATN, thrombocytopenia  Labs: CMP Latest Ref Rng & Units 08/02/2017 08/01/2017 07/31/2017  Glucose 65 - 99 mg/dL 146(H) 130(H) 107(H)  BUN 6 - 20 mg/dL 27(H) 26(H) 29(H)  Creatinine 0.61 - 1.24 mg/dL 1.17  1.20 1.37(H)  Sodium 135 - 145 mmol/L 147(H) 150(H) 157(H)  Potassium 3.5 - 5.1 mmol/L 3.4(L) 3.0(L) 3.6  Chloride 101 - 111 mmol/L 123(H) 122(H) 129(H)  CO2 22 - 32 mmol/L 21(L) 22 22  Calcium 8.9 - 10.3 mg/dL 7.7(L) 7.9(L) 8.4(L)  Total Protein 6.5 - 8.1 g/dL - - 5.0(L)  Total Bilirubin 0.3 - 1.2 mg/dL - - 1.8(H)  Alkaline Phos 38 - 126 U/L - - 113  AST 15 - 41 U/L - - 85(H)  ALT 17 - 63 U/L - - 195(H)    CBC Latest Ref Rng & Units 08/02/2017 08/01/2017 07/31/2017  WBC 4.0 - 10.5 K/uL 17.9(H) 14.3(H) 15.1(H)  Hemoglobin 13.0 - 17.0 g/dL 9.1(L) 8.5(L) 9.1(L)  Hematocrit 39.0 - 52.0 % 29.1(L) 28.2(L) 30.4(L)  Platelets 150 - 400 K/uL 193 188 153    CBG (last 3)  Recent Labs    08/01/17 2335 08/02/17 0351 08/02/17 0744  GLUCAP 89 121* 116*    Imaging: Dg Chest Port 1 View  Result Date: 08/01/2017 CLINICAL DATA:  Respiratory failure EXAM: PORTABLE CHEST 1 VIEW COMPARISON:  07/31/2017 FINDINGS: Support devices are stable. Mild cardiomegaly. Left lower lobe airspace opacity. Minimal right base atelectasis. IMPRESSION: Continued left retrocardiac atelectasis or pneumonia. Minimal right base atelectasis. Electronically Signed   By: Rolm Baptise M.D.   On: 08/01/2017 07:08

## 2017-08-02 NOTE — Progress Notes (Signed)
Nutrition Follow-up  DOCUMENTATION CODES:   Not applicable  INTERVENTION:  - Will change TF regimen to Vital 1.5 @ 55 mL/hr with 30 mL Prostat BID. This regimen + kcal from current IVF will provide 2384 kcal, 119 grams of protein, and 1008 mL free water. - Will monitor Ve and temp closely and adjust nutrition needs and associated TF regimen accordingly.   NUTRITION DIAGNOSIS:   Inadequate oral intake related to inability to eat as evidenced by NPO status. -ongoing  GOAL:   Patient will meet greater than or equal to 90% of their needs -to be met with TF regimen  MONITOR:   Vent status, TF tolerance, Weight trends, Labs, Skin  ASSESSMENT:   Pt with PMH significant for alcohol abuse DM, HLD, HTN, PAD, squamous cell cancer of the lip, and tobacco abuse. Presents this admission with alcohol withdrawal and aspiration pneumonia with resulting acute respiratory failure.   Significant Events: 12/14- pt started on CRRT 12/19- pt extubated, OGT removed, CRRT stopped 12/22- loss of mental status, progressive tachycardia, pt re-intubated, OGT in place 12/23- bronchoscopy due to inability to pass in-line suctioning 12/26- plan to remove HD cath d/t fever   12/26 Pt remains intubated with OGT in place and is receiving Vital AF 1.2 @ 60 mL/hr which is providing 1728 kcal, 108 grams of protein, and 1168 mL free water. Order in place for 300 mL free water QID (1200 mL/day).  Re-estimated kcal need based on current Ve and temp. Will adjust TF as outlined above based on this. Per chart review, weight trending back up and is +6 lbs/2.7 kg since previous assessment. Used current weight (68.4 kg) in re-estimating needs.  Per Dr. Sood's note this AM, pt failed SBT, plan to remove HD cath d/t fever today.   Patient is currently intubated on ventilator support MV: 18.3 L/min Temp (24hrs), Avg:101.1 F (38.4 C), Min:98.8 F (37.1 C), Max:102.7 F (39.3 C) BP: 98/60 and MAP: 65  Medications  reviewed; sliding scale Novolog,2 g IV Mg sulfate x1 run yesterday , 40 mg Protonix per OGT/day.  Labs reviewed; CBGs: 121 and 116 mg/dL this AM, Na: 147 mmol/L, K: 3.4 mmol/L, Cl: 123 mmol/L, BUN: 27 mg/dL, Ca: 7.7 mg/dL.  IVF: D5 @ 50 mL/hr (204 kcal) Drips: Precedex @ 1.2 mcg/kg/min.    12/24 - RD consulted for tube feeding management s/p re-intubation.  - Pt started on PePup protocol.  - Re-estimated needs for new vent setting.  - Weight continuing to trend down, 23 lb since last RD visit.     Patient is currently intubated on ventilator support MV: 8.8 L/min Temp (24hrs), Avg:99.9 F (37.7 C), Min:98.4 F (36.9 C), Max:101.5 F (38.6 C) BP: 160/67 MAP: 98 Propofol: None    12/19 - Extubated ~5 PM yesterday and OGT removed at that time.  - Continues with moderate edema to all extremities and remains on CRRT.  - Weight trending down and is -26 lbs/11.8 kg since previous RD assessment.  - Will monitor for ability for diet to be advanced versus need to Panda placement and TF.  - Estimated nutrition needs updated based on CRRT.      Diet Order:  Diet NPO time specified  EDUCATION NEEDS:   Not appropriate for education at this time  Skin:  Skin Assessment: Skin Integrity Issues: Skin Integrity Issues:: DTI DTI: R buttocks  Last BM:  12/26  Height:   Ht Readings from Last 1 Encounters:  07/30/17 6' (1.829 m)      Weight:   Wt Readings from Last 1 Encounters:  08/02/17 150 lb 12.7 oz (68.4 kg)    Ideal Body Weight:  80.91 kg  BMI:  Body mass index is 20.45 kg/m.  Estimated Nutritional Needs:   Kcal:  2398  Protein:  98-112 grams (1.5-1.7 g/kg)  Fluid:  >1.6 L/day       , MS, RD, LDN, CNSC Inpatient Clinical Dietitian Pager # [phone] After hours/weekend pager # [phone]  

## 2017-08-02 NOTE — Progress Notes (Signed)
Date: August 02, 2017 Rhonda Davis, BSN, RN3, CCM [phone] Chart and notes review for patient progress and needs. Will follow for case management and discharge needs. Next review date: [id number] 

## 2017-08-02 NOTE — Progress Notes (Signed)
The Pt weaned on 08/01/17 but didn't tolerate weaning on 08/02/17. RT tried to obtain a sputum but wasn't able to get enough to send to lab. Pt has been restless today. The last ABG was on 07/30/17

## 2017-08-03 ENCOUNTER — Inpatient Hospital Stay (HOSPITAL_COMMUNITY): Payer: Medicare Other

## 2017-08-03 LAB — GLUCOSE, CAPILLARY
GLUCOSE-CAPILLARY: 145 mg/dL — AB (ref 65–99)
GLUCOSE-CAPILLARY: 104 mg/dL — AB (ref 65–99)
GLUCOSE-CAPILLARY: 99 mg/dL (ref 65–99)
Glucose-Capillary: 100 mg/dL — ABNORMAL HIGH (ref 65–99)
Glucose-Capillary: 81 mg/dL (ref 65–99)
Glucose-Capillary: 87 mg/dL (ref 65–99)
Glucose-Capillary: 97 mg/dL (ref 65–99)

## 2017-08-03 LAB — BASIC METABOLIC PANEL
ANION GAP: 6 (ref 5–15)
BUN: 30 mg/dL — ABNORMAL HIGH (ref 6–20)
CHLORIDE: 115 mmol/L — AB (ref 101–111)
CO2: 20 mmol/L — ABNORMAL LOW (ref 22–32)
Calcium: 7.6 mg/dL — ABNORMAL LOW (ref 8.9–10.3)
Creatinine, Ser: 1.04 mg/dL (ref 0.61–1.24)
Glucose, Bld: 124 mg/dL — ABNORMAL HIGH (ref 65–99)
Potassium: 3.1 mmol/L — ABNORMAL LOW (ref 3.5–5.1)
SODIUM: 141 mmol/L (ref 135–145)

## 2017-08-03 LAB — CBC
HEMATOCRIT: 25.5 % — AB (ref 39.0–52.0)
Hemoglobin: 7.9 g/dL — ABNORMAL LOW (ref 13.0–17.0)
MCH: 28.2 pg (ref 26.0–34.0)
MCHC: 31 g/dL (ref 30.0–36.0)
MCV: 91.1 fL (ref 78.0–100.0)
Platelets: 157 10*3/uL (ref 150–400)
RBC: 2.8 MIL/uL — ABNORMAL LOW (ref 4.22–5.81)
RDW: 18.1 % — ABNORMAL HIGH (ref 11.5–15.5)
WBC: 10.6 10*3/uL — AB (ref 4.0–10.5)

## 2017-08-03 LAB — URINE CULTURE: CULTURE: NO GROWTH

## 2017-08-03 MED ORDER — CARVEDILOL 3.125 MG PO TABS: 3.1250 mg | ORAL_TABLET | Freq: Two times a day (BID) | ORAL | Status: DC

## 2017-08-03 MED ORDER — ACETAMINOPHEN 325 MG PO TABS: 650.0000 mg | ORAL_TABLET | Freq: Four times a day (QID) | ORAL | Status: DC | PRN

## 2017-08-03 MED ORDER — LORAZEPAM 2 MG/ML IJ SOLN
1.0000 mg | Freq: Four times a day (QID) | INTRAMUSCULAR | Status: DC
  Administered 2017-08-03 – 2017-08-05 (×8): 1 mg via INTRAVENOUS
  Filled 2017-08-03 (×8): qty 1

## 2017-08-03 MED ORDER — ACETAMINOPHEN 650 MG RE SUPP
650.0000 mg | RECTAL | Status: DC | PRN
  Administered 2017-08-03: 650 mg via RECTAL
  Filled 2017-08-03: qty 1

## 2017-08-03 MED ORDER — PANTOPRAZOLE SODIUM 40 MG IV SOLR
40.0000 mg | INTRAVENOUS | Status: DC
  Administered 2017-08-03 – 2017-08-04 (×2): 40 mg via INTRAVENOUS
  Filled 2017-08-03 (×2): qty 40

## 2017-08-03 MED ORDER — KCL IN DEXTROSE-NACL 20-5-0.45 MEQ/L-%-% IV SOLN
INTRAVENOUS | Status: DC
  Administered 2017-08-03 – 2017-08-05 (×3): via INTRAVENOUS
  Administered 2017-08-06: 1000 mL via INTRAVENOUS
  Administered 2017-08-07: 04:00:00 via INTRAVENOUS
  Filled 2017-08-03 (×5): qty 1000

## 2017-08-03 MED ORDER — POTASSIUM CHLORIDE 10 MEQ/100ML IV SOLN
10.0000 meq | INTRAVENOUS | Status: AC
  Administered 2017-08-03 (×2): 10 meq via INTRAVENOUS
  Filled 2017-08-03 (×2): qty 100

## 2017-08-03 NOTE — Procedures (Signed)
Extubation Procedure Note  Patient Details:   Name: Andrew [name] DOB: [birth year] MRN: [id number]   Airway Documentation:     Evaluation  O2 sats: stable throughout Complications: No apparent complications Patient did tolerate procedure well. Bilateral Breath Sounds: Clear, Diminished   Yes  Tamera Reason 08/03/2017, 8:53 AM

## 2017-08-03 NOTE — Progress Notes (Signed)
Andrew Andrew [name]  Date of Admission: 07/17/2017 Referring Provider:  Dr. Tomi Bamberger, ER Chief Complaint: Short of breath  HPI: 61 yo male Andrew [name] presented with alcohol withdrawal and required intubation for aspiration pneumonia.  Past Medical History: He  has a past medical history of Anxiety, Back pain, Back pain, Dental caries, Diabetes mellitus, Dyslipidemia, GERD (gastroesophageal reflux disease), Glucose intolerance (impaired glucose tolerance), History of alcohol abuse, Hyperlipidemia, Hypertension, essential, Leg pain, Peripheral arterial disease ([place]), Peripheral vascular disease ([place]), Squamous cell cancer of lip (3/10), Thoracic kyphosis, Tobacco abuse, and Ulcer.  Subjective: No further episode of fever.  Tolerating pressure support better.  Denies chest/abd pain.  Vital signs: BP 124/71   Pulse 64   Temp 99.3 F (37.4 C)   Resp (!) 21   Ht 6' (1.829 m)   Wt 158 lb 4.6 oz (71.8 kg)   SpO2 96%   BMI 21.47 kg/m   Intake/Output: I/O last 3 completed shifts: In: 6676.3 [I.V.:2549.5; NG/GT:4126.8] Out: 2440 [Urine:1810; Other:380; Stool:250]  Physical Exam:  General - alert Eyes - pupils reactive ENT - ETT in place Cardiac - regular, no murmur Chest - no wheeze, rales Abd - soft, non tender Ext - 1+ edema, Lt BKA Skin - no rashes Neuro - moves extremities, follows commands  Discussion: 61 yo male Andrew [name] with aspiration pneumonia in setting of ETOH withdrawal.  UDS positive for THC.  Has prolonged difficulty with delirium.  Assessment/Plan:  Acute hypoxic respiratory failure from aspiration pneumonia and mucus plugging. - pressure support wean as able >> might be ready for extubation trial again - f/u CXR intermittently  Fever 12/26. - no further fever since - f/u culture results from 12/26 - hold abx  Acute systolic CHF. Valvular heart disease. Hx of HTN, PAD. - continue coreg - prn hydralazine - consult cardiology once he is off  vent  Hypernatremia. Hypokalemia. Hypomagnesemia. - continue free water - D5W at 50 ml/hr - replace electrolytes as needed  Sacral, Rt buttock pressure ulcers. - santyl enzymatic debridement, foam dressing  Moderate protein calorie malnutrition. - tube feeds  Anemia of critical illness and chronic disease. - f/u CBC  DM type II. - SSI   Acute metabolic encephalopathy with ETOH withdrawal. - RASS goal 0 to -1 - scheduled ativan - prn haldol  DVT prophylaxis: SQ heparin SUP: Protonix Diet: Tube feeds Goals of care: Full code   Chesley Mires, MD [facility] Pulmonary/Critical Care 08/03/2017, 7:59 AM Pager:  [phone] After 3pm call: [phone]  FLOW SHEET  Cultures: Sputum 12/10 >> oral flora Blood 12/10 >> negative Pneumococcal ag 12/10 >> negative Legionella ag 12/10 >> negative C diff 12/13 >> negative Sputum 12/14 >> negative C diff 12/22 >> negative Blood 12/26 >> Urine 12/26 >> Sputum 12/26 >>  Antibiotics: Unasyn 12/10 >> 12/14 maxipime 12/14 >> 12/19 Vanc 12/14 >> 12/16  Studies: Renal u/s 12/13 >> small amount of ascites, normal kidneys Echo 12/16 >> mild LVH, EF 20 to 25%, grade 2 DD, mod AR, mild/mod MR  Events: 12/10 Admit 12/13 noted to have diarrhea bloody stools with a negative C. difficile 12/14 started CVVHD 12/18 UOP 49ml, volume status improved, levo at 75mcg, high sedation > turned off extubated  12/19 More alert, remains on 2.4 precedex  12/23 reintubated, bronch for mucus plugging 12/26 Fever, Tm 102.7  Lines/tubes: ETT 12/10 >> 12/18  Lt IJ HD cath 12/15 >> 12/26 ETT 12/23 >>  Consults: 12/14 Renal, s/o 12/22 12/23 Wound care  Resolved Problems: Septic  shock, relative adrenal insufficiency, aspiration pneumonia, acute pulmonary edema, acute renal failure with ATN, thrombocytopenia  Labs: CMP Latest Ref Rng & Units 08/02/2017 08/01/2017 07/31/2017  Glucose 65 - 99 mg/dL 146(H) 130(H) 107(H)  BUN 6 - 20 mg/dL 27(H)  26(H) 29(H)  Creatinine 0.61 - 1.24 mg/dL 1.17 1.20 1.37(H)  Sodium 135 - 145 mmol/L 147(H) 150(H) 157(H)  Potassium 3.5 - 5.1 mmol/L 3.4(L) 3.0(L) 3.6  Chloride 101 - 111 mmol/L 123(H) 122(H) 129(H)  CO2 22 - 32 mmol/L 21(L) 22 22  Calcium 8.9 - 10.3 mg/dL 7.7(L) 7.9(L) 8.4(L)  Total Protein 6.5 - 8.1 g/dL - - 5.0(L)  Total Bilirubin 0.3 - 1.2 mg/dL - - 1.8(H)  Alkaline Phos 38 - 126 U/L - - 113  AST 15 - 41 U/L - - 85(H)  ALT 17 - 63 U/L - - 195(H)    CBC Latest Ref Rng & Units 08/02/2017 08/01/2017 07/31/2017  WBC 4.0 - 10.5 K/uL 17.9(H) 14.3(H) 15.1(H)  Hemoglobin 13.0 - 17.0 g/dL 9.1(L) 8.5(L) 9.1(L)  Hematocrit 39.0 - 52.0 % 29.1(L) 28.2(L) 30.4(L)  Platelets 150 - 400 K/uL 193 188 153    CBG (last 3)  Recent Labs    08/02/17 2305 08/03/17 0303 08/03/17 0748  GLUCAP 124* 145* 104*    Imaging: Dg Chest Port 1 View  Result Date: 08/03/2017 CLINICAL DATA:  61 year old male admitted with shortness of breath and alcohol withdrawal. Respiratory failure. Intubated. EXAM: PORTABLE CHEST 1 VIEW COMPARISON:  08/01/2017 and earlier. FINDINGS: Portable AP semi upright view at 0356 hours. Endotracheal tube tip just below the clavicles. Enteric tube courses to the left abdomen, tip not included. Left IJ central line has been removed. The patient is rotated to the left similar to prior studies. Stable lung volumes. Stable cardiac size and mediastinal contours. No pneumothorax or pulmonary edema. Dense retrocardiac opacity on the left. Mild superimposed left lung base veiling opacity. No confluent right lung opacity. IMPRESSION: 1. Left IJ dual lumen catheter removed. Otherwise stable lines and tubes. 2. Continued left lower lobe collapse or consolidation. Consider mucous plug. Probable small left pleural effusion. 3. Right lung essentially clear. Electronically Signed   By: Genevie Ann M.D.   On: 08/03/2017 07:27

## 2017-08-03 NOTE — Progress Notes (Signed)
Nutrition Follow-up  DOCUMENTATION CODES:   Not applicable  INTERVENTION:  - Diet advancement as medically feasible. - RD will continue to monitor for nutrition-related needs.   NUTRITION DIAGNOSIS:   Inadequate oral intake related to inability to eat as evidenced by NPO status. -ongoing  GOAL:   Patient will meet greater than or equal to 90% of their needs -unable to meet   MONITOR:   Diet advancement, Weight trends, Labs, Skin, I & O's  ASSESSMENT:   Pt with PMH significant for alcohol abuse DM, HLD, HTN, PAD, squamous cell cancer of the lip, and tobacco abuse. Presents this admission with alcohol withdrawal and aspiration pneumonia with resulting acute respiratory failure.   Significant Events: 12/14- pt started on CRRT 12/19- pt extubated, OGT removed, CRRT stopped 12/22- loss of mental status, progressive tachycardia, pt re-intubated, OGT in place 12/23- bronchoscopy due to inability to pass in-line suctioning 12/26- plan to remove HD cath d/t fever 12/27- extubated and OGT removed   12/27 Pt was extubated and OGT removed ~8:50 AM today. TF stopped shortly before extubation. Estimated nutrition needs updated and based on weight from yesterday (68.4 kg) as weight is +3.4 kg since that time. Dr. Juanetta Gosling note this AM states no fever since yesterday.   Medications reviewed; sliding scale Novolog, 40 mg IV Protonix/day.  Labs reviewed; CBGs: 145 and 104 mg/dL this AM, K: 3.1 mmol/L, Cl: 115 mmol/L, BUN: 30 mg/dL, Ca: 7.6 mg/dL.  IVF: D5 @ 50 mL/hr (204 kcal)      12/26 - Pt remains intubated with OGT. - Receiving Vital AF 1.2 @ 60 mL/hr which is providing 1728 kcal, 108 grams of protein, and 1168 mL free water.  - Order for 300 mL free water QID (1200 mL/day).   - Re-estimated kcal need based on current Ve and temp.  - RD changed TF regimen to Vital 1.5 @ 55 mL/hr and 30 mL Prostat BID.  - Per chart review, weight trending back up and is +6 lbs/2.7 kg since  previous assessment.  - Used current weight (68.4 kg) in re-estimating needs.  - Per Dr. Juanetta Gosling note this AM, pt failed SBT, plan to remove HD cath d/t fever today.   Patient is currently intubated on ventilator support MV: 18.3 L/min Temp (24hrs), Avg:101.1 F (38.4 C), Min:98.8 F (37.1 C), Max:102.7 F (39.3 C) BP: 98/60 and MAP: 65  Medication; 2 g IV Mg sulfate x1 run yesterday Lab; K: 3.4 mmol/L  IVF: D5 @ 50 mL/hr (204 kcal) Drips: Precedex @ 1.2 mcg/kg/min.    12/24 - RD consulted for tube feeding management s/p re-intubation.  - Pt started on PePup protocol.  - Re-estimated needs for new vent setting.  - Weight continuing to trend down, 23 lb since last RD visit.   Patient is currently intubated on ventilator support MV:8.8L/min Temp (24hrs), Avg:99.9 F (37.7 C), Min:98.4 F (36.9 C), Max:101.5 F (38.6 C) BP: 160/67 MAP: 98   Diet Order:  Diet NPO time specified  EDUCATION NEEDS:   Not appropriate for education at this time  Skin:  Skin Assessment: Skin Integrity Issues: Skin Integrity Issues:: DTI DTI: R buttocks  Last BM:  12/26  Height:   Ht Readings from Last 1 Encounters:  07/30/17 6' (1.829 m)    Weight:   Wt Readings from Last 1 Encounters:  08/03/17 158 lb 4.6 oz (71.8 kg)    Ideal Body Weight:  80.91 kg  BMI:  Body mass index is 21.47 kg/m.  Estimated  Nutritional Needs:   Kcal:  1710-2050 (25-30 kcal/kg)  Protein:  85-105 grams (1.2-1.5 grams/kg)  Fluid:  >1.6 L/day      Jarome Matin, MS, RD, LDN, [facility] Inpatient Clinical Dietitian Pager # [phone] After hours/weekend pager # [phone]

## 2017-08-03 NOTE — Progress Notes (Signed)
2130 took pt off bipap and placed on [place] with 5L o2, proceded to perform mouth care and pt tolerated well. Cont to shave pt beard and then trimmed his hair. The pt tolerated well and reduced his o2 to 3Lpm at 2215. Pt cont to tolerate well. Pt did good job with eating ice chips however when he tried to drink from the straw he then had a little cough. Will notify the MD to see about further orders and possible tests. Pt cont to tolerate [place] at this time (2321). Sitting in bed watching TV. His extremeties are considerably weak. Will probably need further tevaluation.

## 2017-08-04 ENCOUNTER — Inpatient Hospital Stay (HOSPITAL_COMMUNITY): Payer: Medicare Other

## 2017-08-04 LAB — CULTURE, BLOOD (ROUTINE X 2)
CULTURE: NO GROWTH
Culture: NO GROWTH
Special Requests: ADEQUATE
Special Requests: ADEQUATE

## 2017-08-04 LAB — GLUCOSE, CAPILLARY
GLUCOSE-CAPILLARY: 98 mg/dL (ref 65–99)
GLUCOSE-CAPILLARY: 91 mg/dL (ref 65–99)
GLUCOSE-CAPILLARY: 101 mg/dL — AB (ref 65–99)
Glucose-Capillary: 84 mg/dL (ref 65–99)
Glucose-Capillary: 80 mg/dL (ref 65–99)

## 2017-08-04 LAB — CBC
HCT: 24.2 % — ABNORMAL LOW (ref 39.0–52.0)
HEMOGLOBIN: 7.7 g/dL — AB (ref 13.0–17.0)
MCH: 28.5 pg (ref 26.0–34.0)
MCHC: 31.8 g/dL (ref 30.0–36.0)
MCV: 89.6 fL (ref 78.0–100.0)
Platelets: 160 10*3/uL (ref 150–400)
RBC: 2.7 MIL/uL — AB (ref 4.22–5.81)
RDW: 18.3 % — ABNORMAL HIGH (ref 11.5–15.5)
WBC: 7.1 10*3/uL (ref 4.0–10.5)

## 2017-08-04 LAB — BASIC METABOLIC PANEL
ANION GAP: 8 (ref 5–15)
BUN: 23 mg/dL — ABNORMAL HIGH (ref 6–20)
CALCIUM: 7.8 mg/dL — AB (ref 8.9–10.3)
CHLORIDE: 113 mmol/L — AB (ref 101–111)
CO2: 21 mmol/L — AB (ref 22–32)
Creatinine, Ser: 0.93 mg/dL (ref 0.61–1.24)
GFR calc non Af Amer: >60 mL/min (ref >60)
Glucose, Bld: 103 mg/dL — ABNORMAL HIGH (ref 65–99)
Potassium: 2.9 mmol/L — ABNORMAL LOW (ref 3.5–5.1)
Sodium: 142 mmol/L (ref 135–145)

## 2017-08-04 LAB — MAGNESIUM
Magnesium: 1.3 mg/dL — ABNORMAL LOW (ref 1.7–2.4)
Magnesium: 1.3 mg/dL — ABNORMAL LOW (ref 1.7–2.4)

## 2017-08-04 LAB — PHOSPHORUS: PHOSPHORUS: 2.9 mg/dL (ref 2.5–4.6)

## 2017-08-04 MED ORDER — MAGNESIUM SULFATE 2 GM/50ML IV SOLN
2.0000 g | Freq: Once | INTRAVENOUS | Status: AC
  Administered 2017-08-04: 2 g via INTRAVENOUS
  Filled 2017-08-04: qty 50

## 2017-08-04 MED ORDER — POTASSIUM CHLORIDE 10 MEQ/100ML IV SOLN
10.0000 meq | INTRAVENOUS | Status: AC
  Administered 2017-08-04 (×6): 10 meq via INTRAVENOUS
  Filled 2017-08-04 (×5): qty 100

## 2017-08-04 MED ORDER — LABETALOL HCL 5 MG/ML IV SOLN
10.0000 mg | INTRAVENOUS | Status: DC | PRN
  Administered 2017-08-04 – 2017-08-07 (×7): 10 mg via INTRAVENOUS
  Filled 2017-08-04 (×7): qty 4

## 2017-08-04 NOTE — Progress Notes (Signed)
eLink Physician-Brief Progress Note Patient Name: Andrew [name] DOB: [birth year] MRN: [id number]   Date of Service  08/04/2017  HPI/Events of Note  Runs of NSVT K 2.9  eICU Interventions  K repleted. Check Mg, Phos     Intervention Category Intermediate Interventions: Electrolyte abnormality - evaluation and management  Ahliya Glatt 08/04/2017, 4:27 AM

## 2017-08-04 NOTE — Progress Notes (Signed)
**Andrew [name]** Andrew Andrew [name], Andrew Andrew [name] Andrew Andrew [name]  HPI: 61 yo male smoker presented with alcohol withdrawal and required intubation for aspiration pneumonia.  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  Subjective: Less agitated.  Transitioned off Bipap.  Remains on precedex.  Vital signs: BP (!) 148/58 (BP Location: Right Arm)   Pulse 83   Temp (!) 100.4 F (38 C) (Core)   Resp (!) 32   Ht 6' (1.829 m)   Wt 156 lb 1.4 oz (70.8 kg)   SpO2 99%   BMI 21.17 kg/m   Intake/Output: I/O last 3 completed shifts: In: 4055.7 [I.V.:1930.6; NG/GT:1725.1; IV Piggyback:400] Out: 2860 [Urine:2080; Other:380; Stool:400]  Physical Exam:  General - alert Eyes - pupils reactive ENT - no sinus tenderness, no oral exudate, no LAN Cardiac - regular, no murmur Chest - no wheeze, rales Abd - soft, non tender Ext - Rt arm swollen, Lt BKA Skin - no rashes Neuro - follows commands  Discussion: 61 yo male smoker with aspiration pneumonia in setting of ETOH withdrawal.  UDS positive for THC.  Has prolonged difficulty with delirium.  Assessment/Plan:  Acute hypoxic respiratory failure from aspiration pneumonia and mucus plugging. - oxygen to keep SpO2 90 to 95% - f/u CXR intermittently  Fever 12/26. - no further fever since - f/u culture results from 12/26 - hold abx  Acute systolic CHF. Valvular heart disease. Hx of HTN, PAD. - prn hydralazine - resume coreg once he can swallow pills - consult cardiology  once he is off vent  Rt arm edema. - doppler Rt arm to r/o DVT  Hypernatremia. Hypokalemia. Hypomagnesemia. - D5 1/2 NS with 20 meq KCL  - replace electrolytes as needed  Sacral, Rt buttock pressure ulcers. - santyl enzymatic debridement, foam dressing  Dysphagia. Moderate protein calorie malnutrition. - speech therapy to assess swallowing  Anemia of critical illness and chronic disease. - f/u CBC intermittently  DM type II. - SSI   Acute metabolic encephalopathy with ETOH withdrawal. - RASS goal 0 - continue precedex - continue IV ativan - prn haldol - transition to oral medications once he can swallow  Deconditioning. - PT assessment  DVT prophylaxis: SQ heparin SUP: Protonix Diet: NPO Goals of care: Full code   Andrew Mires, MD [facility] Pulmonary/Critical Care 08/04/2017, 11:08 AM Pager:  [phone] After 3pm call: [phone]  FLOW SHEET  Cultures: Sputum 12/10 >> oral flora Blood 12/10 >> negative Pneumococcal ag 12/10 >> negative Legionella ag 12/10 >> negative C diff 12/13 >> negative Sputum 12/14 >> negative C diff 12/22 >> negative Blood 12/26 >> Urine 12/26 >> negative  Antibiotics: Unasyn 12/10 >> 12/14 maxipime 12/14 >> 12/19 Vanc 12/14 >> 12/16  Studies: Renal u/s 12/13 >> small amount of ascites, normal kidneys Echo 12/16 >> mild LVH, EF 20 to 25%, grade 2 DD, mod AR, mild/mod MR  Events: 12/10 Admit 12/13 noted to have diarrhea bloody stools with a negative C. difficile 12/14 started CVVHD 12/18 UOP 455ml, volume status improved, levo at 32mcg, high sedation > turned off extubated  12/19 More alert, remains on 2.4 precedex  12/23 reintubated, bronch  for mucus plugging 12/26 Fever, Tm 102.7  Lines/tubes: ETT 12/10 >> 12/18  Lt IJ HD cath 12/15 >> 12/26 ETT 12/23 >>  Consults: 12/14 Renal, s/o 12/22 12/23 Wound care  Resolved Problems: Septic shock, relative adrenal insufficiency, aspiration pneumonia, acute pulmonary  edema, acute renal failure with ATN, thrombocytopenia  Labs: CMP Latest Ref Rng & Units 08/04/2017 08/03/2017 08/02/2017  Glucose 65 - 99 mg/dL 103(H) 124(H) 146(H)  BUN 6 - 20 mg/dL 23(H) 30(H) 27(H)  Creatinine 0.61 - 1.24 mg/dL 0.93 1.04 1.17  Sodium 135 - 145 mmol/L 142 141 147(H)  Potassium 3.5 - 5.1 mmol/L 2.9(L) 3.1(L) 3.4(L)  Chloride 101 - 111 mmol/L 113(H) 115(H) 123(H)  CO2 22 - 32 mmol/L 21(L) 20(L) 21(L)  Calcium 8.9 - 10.3 mg/dL 7.8(L) 7.6(L) 7.7(L)  Total Protein 6.5 - 8.1 g/dL - - -  Total Bilirubin 0.3 - 1.2 mg/dL - - -  Alkaline Phos 38 - 126 U/L - - -  AST 15 - 41 U/L - - -  ALT 17 - 63 U/L - - -    CBC Latest Ref Rng & Units 08/04/2017 08/03/2017 08/02/2017  WBC 4.0 - 10.5 K/uL 7.1 10.6(H) 17.9(H)  Hemoglobin 13.0 - 17.0 g/dL 7.7(L) 7.9(L) 9.1(L)  Hematocrit 39.0 - 52.0 % 24.2(L) 25.5(L) 29.1(L)  Platelets 150 - 400 K/uL 160 157 193    CBG (last 3)  Recent Labs    08/03/17 2342 08/04/17 0328 08/04/17 0724  GLUCAP 97 98 84    Imaging: Dg Chest Port 1 View  Result Date: 08/03/2017 CLINICAL DATA:  61 year old male admitted with shortness of Andrew [name] and alcohol withdrawal. Respiratory failure. Intubated. EXAM: PORTABLE CHEST 1 VIEW COMPARISON:  08/01/2017 and earlier. FINDINGS: Portable AP semi upright view at 0356 hours. Endotracheal tube tip just below the clavicles. Enteric tube courses to the left abdomen, tip not included. Left IJ central line has been removed. The patient is rotated to the left similar to prior studies. Stable lung volumes. Stable cardiac size and mediastinal contours. No pneumothorax or pulmonary edema. Dense retrocardiac opacity on the left. Mild superimposed left lung base veiling opacity. No confluent right lung opacity. IMPRESSION: 1. Left IJ dual lumen catheter removed. Otherwise stable lines and tubes. 2. Continued left lower lobe collapse or consolidation. Consider mucous plug. Probable small left pleural effusion. 3. Right lung  essentially clear. Electronically Signed   By: Genevie Ann M.D.   On: 08/03/2017 07:27

## 2017-08-04 NOTE — Evaluation (Signed)
Physical Therapy Evaluation Patient Details Name: BASEL DEFALCO MRN: [id number] DOB: 1956/02/06 Today's Date: 08/04/2017   History of Present Illness  61 y.o. male admitted on 07/17/17 for SOB, ETOH withdrawl.  Pt was intubated 12/10-12/18 and then again from 12/22-12/27/18.  Dx with acute hypoxic respiratory failure from aspiration PNA and mucus plugging, acute systolic CHF, calcular heart disease,   hypernatremia, hypokalemia, hypomagnesemia, sacral pressure wound, anemia of critical illness and chronic disease, acute metabolic encepholopathy with ETOH withdrawl.  Other significant PMH of thoracic kyphosis (with chronic back pain), PVD, PAD, HTN, DM, several left leg bypass surgeries with ultimate L BKAin 2014.    Clinical Impression  Pt is very weak and deconditioned and far from his reported baseline of hopping to get to his WC at home.  He will likely need prolonged rehabilitation after his acute hospital stay to be able to return home safely.   PT to follow acutely for deficits listed below.       Follow Up Recommendations SNF    Equipment Recommendations  Hospital bed    Recommendations for Other Services   NA     Precautions / Restrictions Precautions Precautions: Fall;Other (comment) Precaution Comments: monitor vitals      Mobility  Bed Mobility Overal bed mobility: Needs Assistance Bed Mobility: Supine to Sit;Sit to Supine     Supine to sit: Max assist;HOB elevated Sit to supine: +2 for physical assistance;Max assist   General bed mobility comments: One person max assist to get pt to EOB using HOB elevated and bed pad to progress hips forward.  Attempted to get pt to use bed rail to assist in transitions, but he could not maintain grip on the bed rail to help.  Once fatigued, pt needed max assist +2 to reposition in flat bed to help control trunk descent and lift legs back into bed.   Transfers                 General transfer comment: not ready yet.  May  need to start with lateral scoot into drop arm chair vs. maxi sky total lift for OOB time.          Balance Overall balance assessment: Needs assistance Sitting-balance support: Feet supported;Bilateral upper extremity supported(right foot) Sitting balance-Leahy Scale: Poor Sitting balance - Comments: mod assist EOB to prevent left lateral lean/LOB.  Pt attempting to correct with trunk and arms, he is just not strong enough to do so without assistance. Sat EOB x 10 mins with VSS on O2 via NA.  RR stayed in the 20s and he did not appear to be in any distress.  Postural control: Left lateral lean                                   Pertinent Vitals/Pain Pain Assessment: Faces Faces Pain Scale: Hurts little more Pain Location: chronic back pain Pain Descriptors / Indicators: Grimacing;Guarding Pain Intervention(s): Limited activity within patient's tolerance;Monitored during session;Repositioned    Home Living Family/patient expects to be discharged to:: Unsure Living Arrangements: Other relatives(sister and brother)   Type of Home: House Home Access: Ramped entrance     Home Layout: One level Home Equipment: Wheelchair - manual      Prior Function Level of Independence: Needs assistance   Gait / Transfers Assistance Needed: per pt report he hopped to his WC and his WC could fit into the bathroom, but pt  is a questionable historian.            Hand Dominance   Dominant Hand: Right    Extremity/Trunk Assessment   Upper Extremity Assessment Upper Extremity Assessment: RUE deficits/detail;LUE deficits/detail RUE Deficits / Details: bil UE with significant distal edema R>L, cannot raise arms against gravity (from the shoulders).  LUE Deficits / Details: bil UE with significant distal edema R>L, cannot raise arms against gravity (from the shoulders).     Lower Extremity Assessment Lower Extremity Assessment: RLE deficits/detail;LLE deficits/detail RLE  Deficits / Details: right leg with significant weakness, ankle 3-/5, knee extension 3-/5.   LLE Deficits / Details: left leg with h/o BKA in 2014.  Per pt he never was fitted for a prosthesis because "the stump is too short".  He is starting to develop some flexion contracture/tightness at the knee, but responded well to PROM.  knee extension 3-/5 in this leg.  Pt is unable to move legs against gravity (hip flexion) to progress them to EOB without help.     Cervical / Trunk Assessment Cervical / Trunk Assessment: Kyphotic;Other exceptions Cervical / Trunk Exceptions: Pt is very kyphotic with chronic back pain  Communication   Communication: Other (comment)(raspy voice)  Cognition Arousal/Alertness: Awake/alert Behavior During Therapy: WFL for tasks assessed/performed Overall Cognitive Status: Impaired/Different from baseline Area of Impairment: Orientation;Attention;Memory;Following commands;Safety/judgement;Awareness;Problem solving                 Orientation Level: Disoriented to;Place;Situation;Time   Memory: Decreased short-term memory Following Commands: Follows one step commands consistently Safety/Judgement: Decreased awareness of safety;Decreased awareness of deficits Awareness: Intellectual Problem Solving: Slow processing General Comments: Pt a bit slow to process questions, [place]  He knows he is in [place] and it is Dec, but cannot tell me the year.  He cannot tell me he is in the hospital or why.               Assessment/Plan    PT Assessment Patient needs continued PT services  PT Problem List Decreased strength;Decreased range of motion;Decreased activity tolerance;Decreased balance;Decreased mobility;Decreased cognition;Decreased knowledge of use of DME;Decreased safety awareness;Cardiopulmonary status limiting activity;Decreased knowledge of precautions;Decreased skin integrity;Pain       PT Treatment Interventions DME  instruction;Functional mobility training;Therapeutic activities;Therapeutic exercise;Neuromuscular re-education;Balance training;Cognitive remediation;Patient/family education;Wheelchair mobility training    PT Goals (Current goals can be found in the Care Plan section)  Acute Rehab PT Goals Patient Stated Goal: did not state PT Goal Formulation: With patient Time For Goal Achievement: 08/18/17 Potential to Achieve Goals: Fair    Frequency Min 2X/week   Barriers to discharge Decreased caregiver support unsure of his help at home       AM-PAC PT "6 Clicks" Daily Activity  Outcome Measure Difficulty turning over in bed (including adjusting bedclothes, sheets and blankets)?: Unable Difficulty moving from lying on back to sitting on the side of the bed? : Unable Difficulty sitting down on and standing up from a chair with arms (e.g., wheelchair, bedside commode, etc,.)?: Unable Help needed moving to and from a bed to chair (including a wheelchair)?: Total Help needed walking in hospital room?: Total Help needed climbing 3-5 steps with a railing? : Total 6 Click Score: 6    End of Session Equipment Utilized During Treatment: Oxygen Activity Tolerance: Patient limited by fatigue Patient left: in bed;with call bell/phone within reach;with bed alarm set Nurse Communication: Mobility status PT Visit Diagnosis: Muscle weakness (generalized) (M62.81);Other abnormalities of gait and  mobility (R26.89)    Time: 5597-4163 PT Time Calculation (min) (ACUTE ONLY): 27 min   Charges:        Wells Guiles B. Jezabel Lecker, PT, DPT [phone]   PT Evaluation $PT Eval Moderate Complexity: 1 Mod PT Treatments $Therapeutic Activity: 8-22 mins   08/04/2017, 1:17 PM

## 2017-08-04 NOTE — Progress Notes (Signed)
Modified Barium Swallow Progress Note  Patient Details  Name: Andrew [name] MRN: [id number] Date of Birth: [birth year]  Today's Date: 08/04/2017  Modified Barium Swallow completed.  Full report located under Chart Review in the Imaging Section.  Brief recommendations include the following:  Clinical Impression  Suspect acute on chronic oropharyngeal dysphagia with current exacerbation due to deconditioning, lengthy intubations x2.  Pt demonstrates severe sensorimotor dysphagia charaterized by delayed oral transiting and piecemealing with each bolus.  He piecemeals conducting 5-6 swallows with liquid bolus causing SlP to suspect compensation strategy from premorbid dysphagia.  Oropharyngeal swallow are weak resulting in residuals which pt does NOT sense and can not adequately dry swallow or "hock" expectorate to clear.  Puree clearance was easier with sequential swallows of thin from straw - suspect summation impact but pt did have laryngeal penetration.  Head turn to left not helpful.  Pt had difficulty holding his head upright due to gross weakness.    At this time due to gross residuals and weak cough placing him at high aspiration risk, SLP recommends npo x ice chips and tsps of water.  Will follow up for dysphagia management, repeat MBS prior to po initiation indicated due to sensorimotor deficits and pt aspiration of secretions.  Thanks for this order.    Swallow Evaluation Recommendations       SLP Diet Recommendations: NPO;Ice chips PRN after oral care(tsps water after oral care)       Medication Administration: Via alternative means               Oral Care Recommendations: Oral care QID      Luanna Salk, [place] [facility] SLP [phone]   [name] 08/04/2017,5:26 PM
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
## 2017-08-05 ENCOUNTER — Inpatient Hospital Stay (HOSPITAL_COMMUNITY): Payer: Medicare Other

## 2017-08-05 DIAGNOSIS — R609 Edema, unspecified: Secondary | ICD-10-CM

## 2017-08-05 LAB — BASIC METABOLIC PANEL
ANION GAP: 6 (ref 5–15)
Anion gap: 6 (ref 5–15)
BUN: 17 mg/dL (ref 6–20)
BUN: 16 mg/dL (ref 6–20)
CALCIUM: 7.7 mg/dL — AB (ref 8.9–10.3)
CO2: 20 mmol/L — AB (ref 22–32)
CO2: 20 mmol/L — AB (ref 22–32)
CREATININE: 0.85 mg/dL (ref 0.61–1.24)
Calcium: 7.7 mg/dL — ABNORMAL LOW (ref 8.9–10.3)
Chloride: 116 mmol/L — ABNORMAL HIGH (ref 101–111)
Chloride: 117 mmol/L — ABNORMAL HIGH (ref 101–111)
Creatinine, Ser: 0.9 mg/dL (ref 0.61–1.24)
GFR calc Af Amer: >60 mL/min (ref >60)
GFR calc non Af Amer: >60 mL/min (ref >60)
GFR calc non Af Amer: >60 mL/min (ref >60)
GLUCOSE: 111 mg/dL — AB (ref 65–99)
Glucose, Bld: 115 mg/dL — ABNORMAL HIGH (ref 65–99)
POTASSIUM: 3.1 mmol/L — AB (ref 3.5–5.1)
Potassium: 3.3 mmol/L — ABNORMAL LOW (ref 3.5–5.1)
SODIUM: 143 mmol/L (ref 135–145)
Sodium: 142 mmol/L (ref 135–145)

## 2017-08-05 LAB — GLUCOSE, CAPILLARY
GLUCOSE-CAPILLARY: 105 mg/dL — AB (ref 65–99)
GLUCOSE-CAPILLARY: 108 mg/dL — AB (ref 65–99)
GLUCOSE-CAPILLARY: 110 mg/dL — AB (ref 65–99)
GLUCOSE-CAPILLARY: 110 mg/dL — AB (ref 65–99)
GLUCOSE-CAPILLARY: 95 mg/dL (ref 65–99)
GLUCOSE-CAPILLARY: 99 mg/dL (ref 65–99)
Glucose-Capillary: 106 mg/dL — ABNORMAL HIGH (ref 65–99)

## 2017-08-05 LAB — CBC
HEMATOCRIT: 23.5 % — AB (ref 39.0–52.0)
HEMOGLOBIN: 7.6 g/dL — AB (ref 13.0–17.0)
MCH: 28.5 pg (ref 26.0–34.0)
MCHC: 32.3 g/dL (ref 30.0–36.0)
MCV: 88 fL (ref 78.0–100.0)
Platelets: 179 10*3/uL (ref 150–400)
RBC: 2.67 MIL/uL — ABNORMAL LOW (ref 4.22–5.81)
RDW: 18 % — ABNORMAL HIGH (ref 11.5–15.5)
WBC: 4.8 10*3/uL (ref 4.0–10.5)

## 2017-08-05 LAB — MAGNESIUM: Magnesium: 1.5 mg/dL — ABNORMAL LOW (ref 1.7–2.4)

## 2017-08-05 MED ORDER — HYDRALAZINE HCL 20 MG/ML IJ SOLN
10.0000 mg | Freq: Four times a day (QID) | INTRAMUSCULAR | Status: DC | PRN
  Administered 2017-08-05 – 2017-08-08 (×7): 10 mg via INTRAVENOUS
  Filled 2017-08-05 (×7): qty 1

## 2017-08-05 MED ORDER — DEXMEDETOMIDINE HCL IN NACL 200 MCG/50ML IV SOLN
0.0000 ug/kg/h | INTRAVENOUS | Status: DC
  Administered 2017-08-05 (×2): 0.7 ug/kg/h via INTRAVENOUS
  Administered 2017-08-05 (×2): 1 ug/kg/h via INTRAVENOUS
  Administered 2017-08-05: 0.7 ug/kg/h via INTRAVENOUS
  Administered 2017-08-05: 0.8 ug/kg/h via INTRAVENOUS
  Administered 2017-08-05: 0.7 ug/kg/h via INTRAVENOUS
  Administered 2017-08-05: 1 ug/kg/h via INTRAVENOUS
  Administered 2017-08-06 (×2): 0.5 ug/kg/h via INTRAVENOUS
  Administered 2017-08-06: 0.7 ug/kg/h via INTRAVENOUS
  Administered 2017-08-06 – 2017-08-07 (×4): 0.5 ug/kg/h via INTRAVENOUS
  Filled 2017-08-05 (×13): qty 50

## 2017-08-05 MED ORDER — MAGNESIUM SULFATE 2 GM/50ML IV SOLN
2.0000 g | Freq: Once | INTRAVENOUS | Status: AC
  Administered 2017-08-05: 2 g via INTRAVENOUS
  Filled 2017-08-05: qty 50

## 2017-08-05 MED ORDER — POTASSIUM CHLORIDE 10 MEQ/100ML IV SOLN
10.0000 meq | INTRAVENOUS | Status: AC
  Administered 2017-08-05 – 2017-08-06 (×4): 10 meq via INTRAVENOUS
  Filled 2017-08-05 (×4): qty 100

## 2017-08-05 MED ORDER — METOPROLOL TARTRATE 5 MG/5ML IV SOLN
5.0000 mg | Freq: Three times a day (TID) | INTRAVENOUS | Status: DC
  Administered 2017-08-05 – 2017-08-08 (×10): 5 mg via INTRAVENOUS
  Filled 2017-08-05 (×10): qty 5

## 2017-08-05 MED ORDER — POTASSIUM CHLORIDE 10 MEQ/100ML IV SOLN: 10.0000 meq | INTRAVENOUS | Status: DC

## 2017-08-05 MED ORDER — LORAZEPAM 2 MG/ML IJ SOLN
2.0000 mg | Freq: Four times a day (QID) | INTRAMUSCULAR | Status: DC
  Administered 2017-08-05 – 2017-08-10 (×21): 2 mg via INTRAVENOUS
  Filled 2017-08-05 (×21): qty 1

## 2017-08-05 MED ORDER — POTASSIUM CHLORIDE 10 MEQ/100ML IV SOLN
10.0000 meq | INTRAVENOUS | Status: AC
  Administered 2017-08-05 (×4): 10 meq via INTRAVENOUS
  Filled 2017-08-05 (×4): qty 100

## 2017-08-05 NOTE — Progress Notes (Signed)
[facility] ADULT ICU REPLACEMENT PROTOCOL FOR AM LAB REPLACEMENT ONLY  The patient does apply for the [facility] Adult ICU Electrolyte Replacment Protocol based on the criteria listed below:   1. Is GFR >/= 40 ml/min? Yes.    Patient's GFR today is >60 2. Is urine output >/= 0.5 ml/kg/hr for the last 6 hours? Yes.   Patient's UOP is 0.57 ml/kg/hr 3. Is BUN < 60 mg/dL? Yes.    Patient's BUN today is 17 4. Abnormal electrolyte(s): 3.1 5. Ordered repletion with: per protocol 6. If a panic level lab has been reported, has the CCM MD in charge been notified? Yes.  .   Physician:  Dr. Harlene Ramus, Philis Nettle 08/05/2017 5:32 AM

## 2017-08-05 NOTE — Progress Notes (Signed)
eLink Physician-Brief Progress Note Patient Name: Andrew [name] DOB: September 07, 1955 MRN: [id number]   Date of Service  08/05/2017  HPI/Events of Note  Hypokalemia  eICU Interventions  Potassium replaced     Intervention Category Intermediate Interventions: Electrolyte abnormality - evaluation and management  Merilyn Pagan 08/05/2017, 9:31 PM

## 2017-08-05 NOTE — Progress Notes (Signed)
*  Preliminary Results* Right upper extremity venous duplex completed. Right upper extremity is negative for deep and superficial vein thrombosis.  08/05/2017 9:33 AM  Maudry Mayhew, BS, RVT, RDCS, RDMS
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
## 2017-08-06 LAB — CBC
HEMATOCRIT: 24.1 % — AB (ref 39.0–52.0)
HEMOGLOBIN: 7.7 g/dL — AB (ref 13.0–17.0)
MCH: 28.2 pg (ref 26.0–34.0)
MCHC: 32 g/dL (ref 30.0–36.0)
MCV: 88.3 fL (ref 78.0–100.0)
Platelets: 161 10*3/uL (ref 150–400)
RBC: 2.73 MIL/uL — AB (ref 4.22–5.81)
RDW: 18 % — ABNORMAL HIGH (ref 11.5–15.5)
WBC: 5.1 10*3/uL (ref 4.0–10.5)

## 2017-08-06 LAB — GLUCOSE, CAPILLARY
GLUCOSE-CAPILLARY: 96 mg/dL (ref 65–99)
GLUCOSE-CAPILLARY: 99 mg/dL (ref 65–99)
Glucose-Capillary: 90 mg/dL (ref 65–99)
Glucose-Capillary: 102 mg/dL — ABNORMAL HIGH (ref 65–99)
Glucose-Capillary: 97 mg/dL (ref 65–99)
Glucose-Capillary: 95 mg/dL (ref 65–99)

## 2017-08-06 LAB — BASIC METABOLIC PANEL
Anion gap: 5 (ref 5–15)
BUN: 14 mg/dL (ref 6–20)
CHLORIDE: 116 mmol/L — AB (ref 101–111)
CO2: 21 mmol/L — ABNORMAL LOW (ref 22–32)
Calcium: 7.7 mg/dL — ABNORMAL LOW (ref 8.9–10.3)
Creatinine, Ser: 0.81 mg/dL (ref 0.61–1.24)
GFR calc Af Amer: >60 mL/min (ref >60)
GFR calc non Af Amer: >60 mL/min (ref >60)
GLUCOSE: 106 mg/dL — AB (ref 65–99)
POTASSIUM: 3.4 mmol/L — AB (ref 3.5–5.1)
Sodium: 142 mmol/L (ref 135–145)

## 2017-08-06 LAB — MAGNESIUM: MAGNESIUM: 1.5 mg/dL — AB (ref 1.7–2.4)

## 2017-08-06 MED ORDER — POTASSIUM CHLORIDE 10 MEQ/100ML IV SOLN
10.0000 meq | INTRAVENOUS | Status: AC
  Administered 2017-08-06 (×4): 10 meq via INTRAVENOUS
  Filled 2017-08-06 (×4): qty 100

## 2017-08-06 MED ORDER — MAGNESIUM SULFATE 2 GM/50ML IV SOLN
2.0000 g | Freq: Once | INTRAVENOUS | Status: AC
  Administered 2017-08-06: 2 g via INTRAVENOUS
  Filled 2017-08-06: qty 50
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
## 2017-08-06 NOTE — Progress Notes (Signed)
LCSW aware of disposition recommendations: SNF likely  LCSW will hold consult at this time as SLP remains involved and working with patient diet. Per notes from MD:  f/u with speech therapy - might need coretrak if no improvement soon  Will need to await specific diet recommendations and if patient needs coretrak  this will limit placement options.  Will continue to follow and assist with discharge planning and needs. LCSW left handoff for weekend CSW to follow up with team on Monday.  Lane Hacker, MSW Clinical Social Work: [employer] Coverage for :  [phone]

## 2017-08-07 ENCOUNTER — Inpatient Hospital Stay (HOSPITAL_COMMUNITY): Payer: Medicare Other

## 2017-08-07 DIAGNOSIS — E876 Hypokalemia: Secondary | ICD-10-CM

## 2017-08-07 DIAGNOSIS — I10 Essential (primary) hypertension: Secondary | ICD-10-CM

## 2017-08-07 DIAGNOSIS — E785 Hyperlipidemia, unspecified: Secondary | ICD-10-CM

## 2017-08-07 DIAGNOSIS — G9340 Encephalopathy, unspecified: Secondary | ICD-10-CM

## 2017-08-07 DIAGNOSIS — R509 Fever, unspecified: Secondary | ICD-10-CM

## 2017-08-07 DIAGNOSIS — I169 Hypertensive crisis, unspecified: Secondary | ICD-10-CM

## 2017-08-07 LAB — CBC
HCT: 25.8 % — ABNORMAL LOW (ref 39.0–52.0)
HEMOGLOBIN: 8.4 g/dL — AB (ref 13.0–17.0)
MCH: 28.7 pg (ref 26.0–34.0)
MCHC: 32.6 g/dL (ref 30.0–36.0)
MCV: 88.1 fL (ref 78.0–100.0)
Platelets: 175 10*3/uL (ref 150–400)
RBC: 2.93 MIL/uL — AB (ref 4.22–5.81)
RDW: 17.9 % — ABNORMAL HIGH (ref 11.5–15.5)
WBC: 7.4 10*3/uL (ref 4.0–10.5)

## 2017-08-07 LAB — BASIC METABOLIC PANEL
Anion gap: 6 (ref 5–15)
BUN: 10 mg/dL (ref 6–20)
CALCIUM: 7.7 mg/dL — AB (ref 8.9–10.3)
CHLORIDE: 116 mmol/L — AB (ref 101–111)
CO2: 20 mmol/L — ABNORMAL LOW (ref 22–32)
CREATININE: 0.76 mg/dL (ref 0.61–1.24)
GFR calc non Af Amer: >60 mL/min (ref >60)
Glucose, Bld: 117 mg/dL — ABNORMAL HIGH (ref 65–99)
Potassium: 3 mmol/L — ABNORMAL LOW (ref 3.5–5.1)
SODIUM: 142 mmol/L (ref 135–145)

## 2017-08-07 LAB — GLUCOSE, CAPILLARY
GLUCOSE-CAPILLARY: 108 mg/dL — AB (ref 65–99)
GLUCOSE-CAPILLARY: 97 mg/dL (ref 65–99)
GLUCOSE-CAPILLARY: 97 mg/dL (ref 65–99)
Glucose-Capillary: 105 mg/dL — ABNORMAL HIGH (ref 65–99)
Glucose-Capillary: 109 mg/dL — ABNORMAL HIGH (ref 65–99)
Glucose-Capillary: 98 mg/dL (ref 65–99)

## 2017-08-07 LAB — CULTURE, BLOOD (ROUTINE X 2)
CULTURE: NO GROWTH
CULTURE: NO GROWTH
SPECIAL REQUESTS: ADEQUATE
Special Requests: ADEQUATE

## 2017-08-07 LAB — URINALYSIS, ROUTINE W REFLEX MICROSCOPIC
BILIRUBIN URINE: NEGATIVE
Glucose, UA: NEGATIVE mg/dL
Ketones, ur: NEGATIVE mg/dL
Nitrite: POSITIVE — AB
PROTEIN: NEGATIVE mg/dL
Specific Gravity, Urine: 1.015 (ref 1.005–1.030)
pH: 5 (ref 5.0–8.0)

## 2017-08-07 LAB — MAGNESIUM: MAGNESIUM: 1.4 mg/dL — AB (ref 1.7–2.4)

## 2017-08-07 MED ORDER — POTASSIUM CHLORIDE 20 MEQ/15ML (10%) PO SOLN
40.0000 meq | Freq: Two times a day (BID) | ORAL | Status: AC
  Administered 2017-08-07 (×2): 40 meq via ORAL
  Filled 2017-08-07 (×2): qty 30

## 2017-08-07 MED ORDER — MAGNESIUM SULFATE 4 GM/100ML IV SOLN
4.0000 g | Freq: Once | INTRAVENOUS | Status: AC
  Administered 2017-08-07: 4 g via INTRAVENOUS
  Filled 2017-08-07: qty 100

## 2017-08-07 MED ORDER — CHLORHEXIDINE GLUCONATE 0.12 % MT SOLN
OROMUCOSAL | Status: AC
Start: 2017-08-07 — End: 2017-08-07
  Administered 2017-08-07: 15 mL
  Filled 2017-08-07: qty 15

## 2017-08-07 MED ORDER — HALOPERIDOL LACTATE 5 MG/ML IJ SOLN: 2.0000 mg | Freq: Four times a day (QID) | INTRAMUSCULAR | Status: DC | PRN

## 2017-08-07 MED ORDER — POTASSIUM CHLORIDE 10 MEQ/100ML IV SOLN
10.0000 meq | INTRAVENOUS | Status: AC
  Administered 2017-08-07 (×5): 10 meq via INTRAVENOUS
  Filled 2017-08-07 (×3): qty 100

## 2017-08-07 NOTE — Progress Notes (Addendum)
  Speech Language Pathology Treatment: Dysphagia  Patient Details Name: Andrew [name] MRN: [id number] DOB: 10-Feb-1956 Today's Date: 08/07/2017 Time: 9373-4287 SLP Time Calculation (min) (ACUTE ONLY): 30 min  Assessment / Plan / Recommendation Clinical Impression  Today pt presents with fever, rapid shallow rate of breathing and dysphonia/dysarthria.  SLP provided oral care and single ice chips.  Multiple dry swallows noted with each ice chip bolus, cough with 75% of boluses provided.  Pt required total cues to cough/"hock" to move as many secretions as able from pharynx to oral cavity to allow SLP to orally suction.  Copious amount of white tinged secretions removed.    Pt confused today asking if the ice costs $1.00 even after SLP redirected to hospital stay.  ? Cognition with medical issues/? ETOH use.     Pt continues with be grossly weak which did not allow adequate pharyngeal clearance with "hock" or dry/liquid swallows on MBS 08/04/2017.  Again suspect some baseline chronic dysphagia.  At this time, given ongoing weakness and fever - pt not ready for repeat MBS.    Recommend continue ice chips as tolerated encouraging pt to swallow, cough and "hock".  SLP to follow up, using teach back, educated pt to plan.    Also recommend consider palliative consult given pt prolonged hospitalization and intubation x2.   Concern present for lack of nutrition x 5 days - Now RN reports pt with possible ileus?? Nutritional plan?   HPI HPI: 61 yo male smoker presented to [facility] ER 12/10 with ETOH withdrawal. Required intubation for aspiration pneumonia. Prolonged ICU course due to agitated delirium, alcohol withdrawal. ETT 12/10 >> 12/18. Started PO diet 12/22., reintubated 12/23-12/27. Pt has MBS 08/04/17 with findings of severe dysphagia and npo x ice recommended.       SLP Plan  Continue with current plan of care       Recommendations  Diet recommendations: NPO(ice chips, single after oral care  only)                Oral Care Recommendations: Oral care QID;Oral care prior to ice chip/H20 Follow up Recommendations: (tbd) SLP Visit Diagnosis: Dysphagia, oropharyngeal phase (R13.12) Plan: Continue with current plan of care       GO               Andrew [name], [place] [facility] SLP [phone]  Andrew [name] 08/07/2017, 12:22 PM

## 2017-08-07 NOTE — Progress Notes (Signed)
[facility] PROGRESS NOTE    Progress Note  Andrew [name]  WUJ:[id number] DOB: 1955-08-09 DOA: 07/17/2017 PCP: Patient, No Pcp Per     Brief Narrative:   Andrew [name] is an 61 y.o. male with past medical history significant for alcohol abuse who presented to the [facility] long emergency department on the day of admission due to alcohol withdrawal. He stated that he drank a significant amount of vodka per day and his last drink of alcohol was at approximately 11 PM he was treated with Ativan but Due to decompensation, he was intubated on 12/10 and admitted to ICU. Hospitalization has been complicated by delirium, AKI requiring CRRT. [facility] assumed care 12/30.     Assessment/Plan:   Acute delirium due to alcohol withdrawal/  Alcohol intoxication, uncomplicated (Andrew [name]): He remains on Precedex. His speech remains garbled, I am not sure this is not alcohol withdrawal. We will stop Precedex, will use Haldol for agitation. Fentanyl for pain.  Acute respiratory failure with hypoxia due to aspiration pneumonia and pulmonary edema: Intubated on 07/17/2017 extubated on 07/25/2017, reintubated on 07/30/2017 and extubated on 08/03/2017 Now currently on room air.  Septic shock secondary to aspiration pneumonia: Culture negative till date, C. difficile was negative. Urine culture has remained negative. He is completed a course of antibiotics.  Acute systolic heart failure: With an EF of 20% likely due to alcohol abuse. We will be judicious with IV fluids.  We will KVO them for now.  Acute kidney injury with ATN: Required C RRT 07/21/2017 to 07/26/2017. Nephrology has signed off since 07/29/2017.  Diabetes mellitus type 2: Continue sliding scale insulin.  Right buttock sacral pressure ulcer: Continue wound care.  Dysphagia: Swallowing evaluation on 07/27/2018 recommended the patient to be n.p.o. continue oral care and ice chips.  Moderate protein caloric  malnutrition:  Hypokalemia: Replete orally recheck in the morning.  Hypomagnesemia: Give 2 g of magnesium IV repeat check in the morning.  Right buttock Sacral pressure ulcer: Wound RN. Santyl for enzymatic debridement of nonviable tissue, foam dressing is in place to protect from further injury. Pt is on a low airloss bed to reduce pressure  S/P BKA (below knee amputation) ([place])  New onset fever: Check a chest x-ray, abdominal x-ray blood cultures and urine cultures.  DVT prophylaxis: lovenox Family Communication: None Disposition Plan/Barrier to D/C: unable to determine Code Status:     Code Status Orders  (From admission, onward)        Start     Ordered   07/17/17 1223  Full code  Continuous     07/17/17 1224    Code Status History    Date Active Date Inactive Code Status Order ID Comments User Context   06/16/2016 01:58 06/18/2016 16:21 Full Code [id number]  [name], MD ED   06/12/2012 15:09 06/14/2012 18:49 Full Code [id number]  [name], RN Inpatient        IV Access:    Peripheral IV   Procedures and diagnostic studies:   No results found.   Medical Consultants:    None.  Anti-Infectives:   None  Subjective:    Andrew [name] he is mumbling, not able to provide a little reliable history.   Objective:    Vitals:   08/07/17 0408 08/07/17 0500 08/07/17 0600 08/07/17 0700  BP:  (!) 168/80 (!) 168/91 (!) 159/81  Pulse:  99 95 88  Resp:  18 (!) 37 (!) 31  Temp: 98.7 F (37.1 C)  TempSrc: Axillary     SpO2:  95% 96% 96%  Weight:   72.1 kg (158 lb 15.2 oz)   Height:        Intake/Output Summary (Last 24 hours) at 08/07/2017 0724 Last data filed at 08/07/2017 0700 Gross per 24 hour  Intake 1569.38 ml  Output 1900 ml  Net -330.62 ml   Filed Weights   08/05/17 0400 08/06/17 0632 08/07/17 0600  Weight: 72 kg (158 lb 11.7 oz) 72.5 kg (159 lb 13.3 oz) 72.1 kg (158 lb 15.2 oz)    Exam: General exam: In no acute  distress. Respiratory system: Good air movement and clear to auscultation. Cardiovascular system: S1 & S2 heard, RRR. +JVD Gastrointestinal system: Abdomen is nondistended, soft and nontender.  Central nervous system: Alert and oriented. No focal neurological deficits. Extremities: No pedal edema. Skin: No rashes, lesions or ulcers   Data Reviewed:    Labs: Basic Metabolic Panel: Recent Labs  Lab 07/31/17 1642 08/01/17 0512 08/01/17 1701  08/04/17 0308 08/04/17 0175 08/05/17 0320 08/05/17 2044 08/06/17 1025 08/07/17 0334  NA  --  150*  --    < > 142  --  142 143 142 142  K  --  3.0*  --    < > 2.9*  --  3.1* 3.3* 3.4* 3.0*  CL  --  122*  --    < > 113*  --  116* 117* 116* 116*  CO2  --  22  --    < > 21*  --  20* 20* 21* 20*  GLUCOSE  --  130*  --    < > 103*  --  111* 115* 106* 117*  BUN  --  26*  --    < > 23*  --  17 16 14 10   CREATININE  --  1.20  --    < > 0.93  --  0.90 0.85 0.81 0.76  CALCIUM  --  7.9*  --    < > 7.8*  --  7.7* 7.7* 7.7* 7.7*  MG 1.3* 1.5* 1.7  --  1.3* 1.3* 1.5*  --  1.5* 1.4*  PHOS 2.9 2.8 3.2  --  2.9  --   --   --   --   --    < > = values in this interval not displayed.   GFR Estimated Creatinine Clearance: 101.4 mL/min (by C-G formula based on SCr of 0.76 mg/dL). Liver Function Tests: No results for input(s): AST, ALT, ALKPHOS, BILITOT, PROT, ALBUMIN in the last 168 hours. No results for input(s): LIPASE, AMYLASE in the last 168 hours. No results for input(s): AMMONIA in the last 168 hours. Coagulation profile No results for input(s): INR, PROTIME in the last 168 hours.  CBC: Recent Labs  Lab 08/03/17 0748 08/04/17 0308 08/05/17 0320 08/06/17 0619 08/07/17 0334  WBC 10.6* 7.1 4.8 5.1 7.4  HGB 7.9* 7.7* 7.6* 7.7* 8.4*  HCT 25.5* 24.2* 23.5* 24.1* 25.8*  MCV 91.1 89.6 88.0 88.3 88.1  PLT 157 160 179 161 175   Cardiac Enzymes: No results for input(s): CKTOTAL, CKMB, CKMBINDEX, TROPONINI in the last 168 hours. BNP (last 3  results) No results for input(s): PROBNP in the last 8760 hours. CBG: Recent Labs  Lab 08/06/17 1128 08/06/17 1634 08/06/17 1919 08/06/17 2312 08/07/17 0354  GLUCAP 99 102* 97 95 105*   D-Dimer: No results for input(s): DDIMER in the last 72 hours. Hgb A1c: No results for input(s): HGBA1C in the last  72 hours. Lipid Profile: No results for input(s): CHOL, HDL, LDLCALC, TRIG, CHOLHDL, LDLDIRECT in the last 72 hours. Thyroid function studies: No results for input(s): TSH, T4TOTAL, T3FREE, THYROIDAB in the last 72 hours.  Invalid input(s): FREET3 Anemia work up: No results for input(s): VITAMINB12, FOLATE, FERRITIN, TIBC, IRON, RETICCTPCT in the last 72 hours. Sepsis Labs: Recent Labs  Lab 08/04/17 0308 08/05/17 0320 08/06/17 0619 08/07/17 0334  WBC 7.1 4.8 5.1 7.4   Microbiology Recent Results (from the past 240 hour(s))  C difficile quick scan w PCR reflex     Status: None   Collection Time: 07/29/17 11:25 AM  Result Value Ref Range Status   C Diff antigen NEGATIVE NEGATIVE Final   C Diff toxin NEGATIVE NEGATIVE Final   C Diff interpretation No C. difficile detected.  Final  Culture, blood (Routine X 2) w Reflex to ID Panel     Status: None   Collection Time: 07/30/17  9:09 AM  Result Value Ref Range Status   Specimen Description BLOOD RIGHT HAND  Final   Special Requests IN PEDIATRIC BOTTLE Blood Culture adequate volume  Final   Culture   Final    NO GROWTH 5 DAYS Performed at [facility], [place] [address] [place], [place] [address]    Report Status 08/04/2017 FINAL  Final  Culture, blood (Routine X 2) w Reflex to ID Panel     Status: None   Collection Time: 07/30/17  9:09 AM  Result Value Ref Range Status   Specimen Description BLOOD LEFT ARM  Final   Special Requests   Final    BOTTLES DRAWN AEROBIC AND ANAEROBIC Blood Culture adequate volume   Culture   Final    NO GROWTH 5 DAYS Performed at [facility], [place] [address] [place], [place]  [address]    Report Status 08/04/2017 FINAL  Final  Urine Culture     Status: None   Collection Time: 07/30/17 11:57 AM  Result Value Ref Range Status   Specimen Description URINE, CATHETERIZED  Final   Special Requests NONE  Final   Culture   Final    NO GROWTH Performed at [facility], [place] [address] [place], [place] [address]    Report Status 07/31/2017 FINAL  Final  Culture, respiratory (NON-Expectorated)     Status: None   Collection Time: 07/30/17 11:49 PM  Result Value Ref Range Status   Specimen Description TRACHEAL ASPIRATE  Final   Special Requests NONE  Final   Gram Stain   Final    MODERATE WBC PRESENT, PREDOMINANTLY PMN RARE SQUAMOUS EPITHELIAL CELLS PRESENT RARE GRAM POSITIVE COCCI IN PAIRS    Culture   Final    RARE Consistent with normal respiratory flora. Performed at [facility], [place] [address] [place], [place] [address]    Report Status 08/02/2017 FINAL  Final  Culture, blood (Routine X 2) w Reflex to ID Panel     Status: None (Preliminary result)   Collection Time: 08/02/17 10:15 AM  Result Value Ref Range Status   Specimen Description BLOOD RIGHT HAND  Final   Special Requests IN PEDIATRIC BOTTLE Blood Culture adequate volume  Final   Culture   Final    NO GROWTH 4 DAYS Performed at [facility], [place] [address] [place], [place] [address]    Report Status PENDING  Incomplete  Culture, blood (Routine X 2) w Reflex to ID Panel     Status:  None (Preliminary result)   Collection Time: 08/02/17 10:25 AM  Result Value Ref Range Status   Specimen Description BLOOD RIGHT HAND  Final   Special Requests IN PEDIATRIC BOTTLE Blood Culture adequate volume  Final   Culture   Final    NO GROWTH 4 DAYS Performed at [facility], [place] [address], [place], [place] [address]    Report Status PENDING  Incomplete  Culture, Urine     Status: None   Collection Time: 08/02/17  1:40 PM  Result Value Ref Range Status   Specimen Description URINE,  RANDOM  Final   Special Requests NONE  Final   Culture   Final    NO GROWTH Performed at [facility], [place] [address], [place], [place] [address]    Report Status 08/03/2017 FINAL  Final     Medications:   . chlorhexidine gluconate (MEDLINE KIT)  15 mL Mouth Rinse BID  . collagenase   Topical Daily  . heparin injection (subcutaneous)  5,000 Units Subcutaneous Q8H  . insulin aspart  0-15 Units Subcutaneous Q4H  . LORazepam  2 mg Intravenous Q6H  . mouth rinse  15 mL Mouth Rinse QID  . metoprolol tartrate  5 mg Intravenous Q8H   Continuous Infusions: . dexmedetomidine (PRECEDEX) IV infusion 0.5 mcg/kg/hr (08/07/17 0700)  . dextrose 5 % and 0.45 % NaCl with KCl 20 mEq/L 50 mL/hr at 08/07/17 0700       LOS: 21 days   [facility] Pager [phone]  *Please refer to [email or web address], password [email or web address] to get updated schedule on who will round on this patient, as hospitalists switch teams weekly. If 7PM-7AM, please contact night-coverage at [email or web address], password [email or web address] for any overnight needs.  08/07/2017, 7:24 AM

## 2017-08-07 NOTE — Progress Notes (Signed)
[facility] P[facility]M PROGRESS NOTE  Date of Admission: 07/17/2017 Referring Provider:  Dr. Tomi Bamberger, ER Chief Complaint: Short of breath  HPI: 61 yo male smoker presented with alcohol withdrawal and required intubation for aspiration pneumonia.  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  Subjective: Off precedex this am.  Calm. Remains confused at times. Weak cough.   Vital signs: BP (!) 159/81   Pulse 88   Temp 100.2 F (37.9 C) (Axillary)   Resp (!) 31   Ht 6' (1.829 m)   Wt 72.1 kg (158 lb 15.2 oz)   SpO2 96%   BMI 21.56 kg/m   Intake/Output: I/O last 3 completed shifts: In: 2460.1 [I.V.:1960.1; Other:400; IV Piggyback:100] Out: 2465 [Urine:2365; Stool:100]  Physical Exam:  General - awake, confused, NAD  Eyes - pupils reactive ENT - no sinus tenderness, no oral exudate, no LAN Cardiac - regular, no murmur Chest - resps even non labored on RA, no wheeze, rales Abd - soft, non tender Ext - Lt BKA Skin - no rashes Neuro - awake, off precedex, mild confusion  Psych - normal mood   Discussion: 61 yo male smoker with aspiration pneumonia in setting of ETOH withdrawal.  UDS positive for THC.  Has prolonged difficulty with delirium.  Assessment/Plan:  Acute metabolic encephalopathy with ETOH withdrawal. - RASS goal 0 - continue 2 mg ativan IV q6h - PRN haldol  - monitor off precedex  - transition to oral medications when able to swallow  Fever. - no other signs of infection  - f/u cultures from 12/26 - monitor off Abx -pulmonary hygiene   Acute systolic  CHF. Valvular heart disease. Hx of HTN, PAD. - lopressor IV until able to swallow pills - prn hydralazine  Hypernatremia. Hypokalemia. Hypomagnesemia. - f/u chem  - replace electrolytes as needed  Sacral, Rt buttock pressure ulcers. - santyl enzymatic debridement, foam dressing  Dysphagia. Moderate protein calorie malnutrition. - f/u with speech therapy - might need coretrak if no improvement soon  Anemia of critical illness and chronic disease. - f/u CBC intermittently  DM type II. - SSI   Deconditioning. - PT assessment  DVT prophylaxis: SQ heparin SUP: Not indicated Diet: NPO Goals of care: Full code   PCCM signing off, please call back if needed.   Nickolas Madrid, NP 08/07/2017  9:31 AM Pager: [phone] or [phone]   FLOW SHEET  Cultures: Sputum 12/10 >> oral flora Blood 12/10 >> negative Pneumococcal ag 12/10 >> negative Legionella ag 12/10 >> negative C diff 12/13 >> negative Sputum 12/14 >> negative C diff 12/22 >> negative Blood 12/26 >> Urine 12/26 >> negative  Antibiotics: Unasyn 12/10 >> 12/14 maxipime 12/14 >> 12/19 Vanc 12/14 >> 12/16  Studies: Renal u/s 12/13 >> small amount of ascites, normal kidneys Echo 12/16 >> mild LVH, EF 20 to 25%, grade 2 DD, mod AR, mild/mod MR Doppler Rt arm 12/29 >> negative  Events: 12/10 Admit 12/13 noted to have diarrhea bloody stools with a negative C. difficile 12/14 started CVVHD 12/18 UOP 472ml, volume status improved, levo at 48mcg, high sedation > turned off extubated  12/19 More alert, remains on 2.4 precedex  12/23 reintubated, bronch for mucus plugging  12/26 Fever, Tm 102.7  Lines/tubes: ETT 12/10 >> 12/18  Lt IJ HD cath 12/15 >> 12/26 ETT 12/23 >>  Consults: 12/14 Renal, s/o 12/22 12/23 Wound care  Resolved Problems: Septic shock, relative adrenal insufficiency, aspiration pneumonia, acute pulmonary edema, acute renal failure with ATN, thrombocytopenia, acute hypoxic  respiratory failure, mucus plugging, aspiration pneumonia  Labs: CMP Latest Ref Rng & Units 08/07/2017 08/06/2017 08/05/2017  Glucose 65 - 99 mg/dL 117(H) 106(H) 115(H)  BUN 6 - 20 mg/dL 10 14 16   Creatinine 0.61 - 1.24 mg/dL 0.76 0.81 0.85  Sodium 135 - 145 mmol/L 142 142 143  Potassium 3.5 - 5.1 mmol/L 3.0(L) 3.4(L) 3.3(L)  Chloride 101 - 111 mmol/L 116(H) 116(H) 117(H)  CO2 22 - 32 mmol/L 20(L) 21(L) 20(L)  Calcium 8.9 - 10.3 mg/dL 7.7(L) 7.7(L) 7.7(L)  Total Protein 6.5 - 8.1 g/dL - - -  Total Bilirubin 0.3 - 1.2 mg/dL - - -  Alkaline Phos 38 - 126 U/L - - -  AST 15 - 41 U/L - - -  ALT 17 - 63 U/L - - -    CBC Latest Ref Rng & Units 08/07/2017 08/06/2017 08/05/2017  WBC 4.0 - 10.5 K/uL 7.4 5.1 4.8  Hemoglobin 13.0 - 17.0 g/dL 8.4(L) 7.7(L) 7.6(L)  Hematocrit 39.0 - 52.0 % 25.8(L) 24.1(L) 23.5(L)  Platelets 150 - 400 K/uL 175 161 179    CBG (last 3)  Recent Labs    08/06/17 2312 08/07/17 0354 08/07/17 0726  GLUCAP 95 105* 109*    Imaging: Dg Abd 1 View  Result Date: 08/07/2017 CLINICAL DATA:  Follow-up ileus EXAM: ABDOMEN - 1 VIEW COMPARISON:  07/30/2017 FINDINGS: Scattered large and small bowel gas is noted. Contrast material from a barium swallow is now noted throughout the colon. No obstructive changes are seen. The nasogastric catheter is not well appreciated. No free air is seen. No bony abnormality is noted. IMPRESSION: Previously administered contrast now lies within the colon. No obstructive changes are seen. Electronically Signed   By: Inez Catalina M.D.   On: 08/07/2017 09:00   Dg Chest Port 1 View  Result Date: 08/07/2017 CLINICAL DATA:  Fever.  Recent extubation EXAM: PORTABLE CHEST 1 VIEW August 03, 2017 FINDINGS: Endotracheal tube and nasogastric tube have been removed. No pneumothorax. There is consolidation in the left lower lobe with small left pleural effusion. There is mild right base atelectasis. Heart is upper normal in size with pulmonary  vascularity within normal limits. No adenopathy. No evident bone lesions. IMPRESSION: Left lower lobe consolidation with small left pleural effusion. Mild right base atelectasis. Stable cardiac silhouette. Electronically Signed   By: Lowella Grip III M.D.   On: 08/07/2017 09:01

## 2017-08-07 NOTE — Care Management Note (Signed)
Case Management Note  Patient Details  Name: Andrew [name] MRN: [id number] Date of Birth: 1956/06/27  Subjective/Objective:                  Remains critical, extubated, renals working, febrile, remains sedated due to delirium.  Action/Plan: Date: August 07, 2017 Velva Harman, BSN, [place], [place] Chart and notes review for patient progress and needs. Will follow for case management and discharge needs. Next review date: [id number]  Expected Discharge Date:  (unknown)               Expected Discharge Plan:  Home/Self Care  In-House Referral:  Clinical Social Work  Discharge planning Services     Post Acute Care Choice:    Choice offered to:     DME Arranged:    DME Agency:     HH Arranged:    [place] Agency:     Status of Service:  In process, will continue to follow  If discussed at Long Length of Stay Meetings, dates discussed:    Additional Comments:  Leeroy Cha, RN 08/07/2017, 7:56 AM

## 2017-08-07 NOTE — Progress Notes (Signed)
RN concerned about persistent delirium and intermittent tachynea  Will restart precedex pccm back on   Dr. Brand Males, M.D., [facility].C.P Pulmonary and Critical Care Medicine Staff Physician, [place] Director - Interstitial Lung Disease  Program  Pulmonary [place] at [place], [place], [address]  Pager: [phone], If no answer or between  15:00h - 7:00h: call [phone] Telephone: [phone]

## 2017-08-07 NOTE — Progress Notes (Signed)
Physical Therapy Treatment Patient Details Name: GARCIA DALZELL MRN: [id number] DOB: 02/11/1956 Today's Date: 08/07/2017    History of Present Illness 61 y.o. male admitted on 07/17/17 for SOB, ETOH withdrawl.  Pt was intubated 12/10-12/18 and then again from 12/22-12/27/18.  Dx with acute hypoxic respiratory failure from aspiration PNA and mucus plugging, acute systolic CHF, calcular heart disease,   hypernatremia, hypokalemia, hypomagnesemia, sacral pressure wound, anemia of critical illness and chronic disease, acute metabolic encepholopathy with ETOH withdrawl.  Other significant PMH of thoracic kyphosis (with chronic back pain), PVD, PAD, HTN, DM, several left leg bypass surgeries with ultimate L BKAin 2014.      PT Comments    Pt continues to require significant assistance for all mobility tasks.  Will continue to follow. Will need SNF.   Follow Up Recommendations  SNF     Equipment Recommendations  Hospital bed    Recommendations for Other Services       Precautions / Restrictions Precautions Precautions: Fall Precaution Comments: monitor vitals Restrictions Weight Bearing Restrictions: No    Mobility  Bed Mobility Overal bed mobility: Needs Assistance Bed Mobility: Supine to Sit;Sit to Supine     Supine to sit: Max assist;HOB elevated Sit to supine: Max assist   General bed mobility comments: One person max assist to get pt to EOB using HOB elevated and bed pad to progress hips forward.  Attempted to get pt to use bed rail to assist in transitions, but he could not maintain grip on the bed rail to help.  Once fatigued, pt needed max assist  to reposition in flat bed to help control trunk descent and lift legs back into bed.   Transfers                 General transfer comment: not ready yet.    Ambulation/Gait                 Stairs            Wheelchair Mobility    Modified Rankin (Stroke Patients Only)       Balance        Sitting balance - Comments: mod assist EOB to prevent posterior and left lateral lean/LOB.  Pt attempting to correct with trunk and arms, he is just not strong enough to do so without assistance. Sat EOB x 5 mins RR stayed in the upper 20s and he did not appear to be in any distress.  Postural control: Posterior lean;Left lateral lean                                  Cognition Arousal/Alertness: Awake/alert Behavior During Therapy: WFL for tasks assessed/performed Overall Cognitive Status: Impaired/Different from baseline Area of Impairment: Orientation;Attention;Memory;Following commands;Safety/judgement;Awareness;Problem solving                 Orientation Level: Disoriented to;Place;Situation;Time   Memory: Decreased short-term memory Following Commands: Follows one step commands consistently Safety/Judgement: Decreased awareness of safety;Decreased awareness of deficits   Problem Solving: Slow processing;Requires verbal cues;Requires tactile cues;Difficulty sequencing        Exercises      General Comments        Pertinent Vitals/Pain Pain Assessment: Faces Pain Score: 2  Faces Pain Scale: Hurts a little bit Pain Location: chronic back pain Pain Descriptors / Indicators: Grimacing;Guarding Pain Intervention(s): Limited activity within patient's tolerance;Repositioned    Home Living  Prior Function            PT Goals (current goals can now be found in the care plan section) Progress towards PT goals: Progressing toward goals    Frequency    Min 2X/week      PT Plan Current plan remains appropriate    Co-evaluation              AM-PAC PT "6 Clicks" Daily Activity  Outcome Measure  Difficulty turning over in bed (including adjusting bedclothes, sheets and blankets)?: Unable Difficulty moving from lying on back to sitting on the side of the bed? : Unable Difficulty sitting down on and standing up  from a chair with arms (e.g., wheelchair, bedside commode, etc,.)?: Unable Help needed moving to and from a bed to chair (including a wheelchair)?: Total Help needed walking in hospital room?: Total Help needed climbing 3-5 steps with a railing? : Total 6 Click Score: 6    End of Session   Activity Tolerance: Patient limited by fatigue Patient left: in bed;with call bell/phone within reach;with bed alarm set   PT Visit Diagnosis: Muscle weakness (generalized) (M62.81);Other abnormalities of gait and mobility (R26.89)     Time: 2423-5361 PT Time Calculation (min) (ACUTE ONLY): 19 min  Charges:  $Therapeutic Activity: 8-22 mins                    G Codes:       Weston Anna, MPT Pager: [phone]

## 2017-08-08 ENCOUNTER — Inpatient Hospital Stay (HOSPITAL_COMMUNITY): Payer: Medicare Other

## 2017-08-08 DIAGNOSIS — Z7189 Other specified counseling: Secondary | ICD-10-CM

## 2017-08-08 DIAGNOSIS — R1310 Dysphagia, unspecified: Secondary | ICD-10-CM

## 2017-08-08 DIAGNOSIS — R29810 Facial weakness: Secondary | ICD-10-CM

## 2017-08-08 DIAGNOSIS — Z515 Encounter for palliative care: Secondary | ICD-10-CM

## 2017-08-08 DIAGNOSIS — I63311 Cerebral infarction due to thrombosis of right middle cerebral artery: Secondary | ICD-10-CM

## 2017-08-08 LAB — BASIC METABOLIC PANEL
Anion gap: 8 (ref 5–15)
BUN: 11 mg/dL (ref 6–20)
CALCIUM: 7.8 mg/dL — AB (ref 8.9–10.3)
CO2: 19 mmol/L — ABNORMAL LOW (ref 22–32)
CREATININE: 0.76 mg/dL (ref 0.61–1.24)
Chloride: 115 mmol/L — ABNORMAL HIGH (ref 101–111)
GFR calc Af Amer: >60 mL/min (ref >60)
Glucose, Bld: 95 mg/dL (ref 65–99)
Potassium: 3.4 mmol/L — ABNORMAL LOW (ref 3.5–5.1)
SODIUM: 142 mmol/L (ref 135–145)

## 2017-08-08 LAB — CBC WITH DIFFERENTIAL/PLATELET
BASOS PCT: 0 %
Basophils Absolute: 0 10*3/uL (ref 0.0–0.1)
EOS ABS: 0 10*3/uL (ref 0.0–0.7)
Eosinophils Relative: 0 %
HCT: 29.9 % — ABNORMAL LOW (ref 39.0–52.0)
HEMOGLOBIN: 9.3 g/dL — AB (ref 13.0–17.0)
Lymphocytes Relative: 13 %
Lymphs Abs: 1.3 10*3/uL (ref 0.7–4.0)
MCH: 27.4 pg (ref 26.0–34.0)
MCHC: 31.1 g/dL (ref 30.0–36.0)
MCV: 88.2 fL (ref 78.0–100.0)
MONO ABS: 1.1 10*3/uL — AB (ref 0.1–1.0)
Monocytes Relative: 11 %
NEUTROS ABS: 7.9 10*3/uL — AB (ref 1.7–7.7)
Neutrophils Relative %: 76 %
PLATELETS: 242 10*3/uL (ref 150–400)
RBC: 3.39 MIL/uL — ABNORMAL LOW (ref 4.22–5.81)
RDW: 17.8 % — ABNORMAL HIGH (ref 11.5–15.5)
WBC: 10.3 10*3/uL (ref 4.0–10.5)

## 2017-08-08 LAB — PHOSPHORUS: Phosphorus: 3.5 mg/dL (ref 2.5–4.6)

## 2017-08-08 LAB — GLUCOSE, CAPILLARY
GLUCOSE-CAPILLARY: 88 mg/dL (ref 65–99)
GLUCOSE-CAPILLARY: 101 mg/dL — AB (ref 65–99)
Glucose-Capillary: 88 mg/dL (ref 65–99)

## 2017-08-08 LAB — MAGNESIUM: MAGNESIUM: 1.6 mg/dL — AB (ref 1.7–2.4)

## 2017-08-08 MED ORDER — MAGNESIUM SULFATE 4 GM/100ML IV SOLN
4.0000 g | Freq: Once | INTRAVENOUS | Status: AC
  Administered 2017-08-08: 4 g via INTRAVENOUS
  Filled 2017-08-08: qty 100

## 2017-08-08 MED ORDER — CEFTRIAXONE SODIUM 1 G IJ SOLR
1.0000 g | INTRAMUSCULAR | Status: DC
  Administered 2017-08-08: 1 g via INTRAVENOUS
  Filled 2017-08-08: qty 10

## 2017-08-08 MED ORDER — ASPIRIN 300 MG RE SUPP: 300.0000 mg | Freq: Every day | RECTAL | Status: DC

## 2017-08-08 MED ORDER — METOPROLOL TARTRATE 5 MG/5ML IV SOLN
5.0000 mg | Freq: Four times a day (QID) | INTRAVENOUS | Status: DC
  Administered 2017-08-08 – 2017-08-09 (×5): 5 mg via INTRAVENOUS
  Filled 2017-08-08 (×5): qty 5

## 2017-08-08 MED ORDER — POLYVINYL ALCOHOL 1.4 % OP SOLN
1.0000 [drp] | Freq: Four times a day (QID) | OPHTHALMIC | Status: DC | PRN
  Filled 2017-08-08: qty 15

## 2017-08-08 MED ORDER — MORPHINE SULFATE (PF) 4 MG/ML IV SOLN
1.0000 mg | INTRAVENOUS | Status: DC | PRN
  Administered 2017-08-09: 2 mg via INTRAVENOUS
  Filled 2017-08-08: qty 1

## 2017-08-08 MED ORDER — GLYCOPYRROLATE 0.2 MG/ML IJ SOLN: 0.2000 mg | INTRAMUSCULAR | Status: DC | PRN

## 2017-08-08 MED ORDER — STROKE: EARLY STAGES OF RECOVERY BOOK
Freq: Once | Status: DC
  Filled 2017-08-08: qty 1

## 2017-08-08 MED ORDER — POTASSIUM CHLORIDE 10 MEQ/100ML IV SOLN
10.0000 meq | INTRAVENOUS | Status: DC
  Administered 2017-08-08 (×5): 10 meq via INTRAVENOUS
  Filled 2017-08-08 (×5): qty 100

## 2017-08-08 MED ORDER — LORAZEPAM 2 MG/ML IJ SOLN: 1.0000 mg | INTRAMUSCULAR | Status: DC | PRN

## 2017-08-08 MED ORDER — ASPIRIN 325 MG PO TABS: 325.0000 mg | ORAL_TABLET | Freq: Every day | ORAL | Status: DC
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
## 2017-08-08 NOTE — Progress Notes (Addendum)
[facility] PROGRESS NOTE    Progress Note  TRAYLON [name]  PJK:[id number] DOB: March 03, 1956 DOA: 07/17/2017 PCP: Patient, No Pcp Per     Brief Narrative:   Andrew [name] is an 62 y.o. male with past medical history significant for alcohol abuse who presented to the [facility] long emergency department on the day of admission due to alcohol withdrawal. He stated that he drank a significant amount of vodka per day and his last drink of alcohol was at approximately 11 PM he was treated with Ativan but Due to decompensation, he was intubated on 12/10 and admitted to ICU. Hospitalization has been complicated by delirium, AKI requiring CRRT. [facility] assumed care 12/30.     Assessment/Plan:   Acute delirium due to alcohol withdrawal/  Alcohol intoxication, uncomplicated ([place]): He remains off Precedex for 24 hours.  Has is now appropriate tracking and responding to yes and no questions. Will use Haldol for agitation. Fentanyl for pain. On my evaluation today he has garbled speech and a facial droop on the right side, will check a CT scan of the head. High likelihood of stroke as he is a vasculopath.  Acute respiratory failure with hypoxia due to aspiration pneumonia and pulmonary edema: Intubated on 07/17/2017 extubated on 07/25/2017, reintubated on 07/30/2017 and extubated on 08/03/2017 Now currently on room air.  Septic shock secondary to aspiration pneumonia: Culture negative till date, C. difficile was negative. He is completed a course of antibiotics.  Acute systolic heart failure: With an EF of 20% likely due to alcohol abuse. We will be judicious with IV fluids.  We will KVO them for now.  Acute kidney injury with ATN: Required C RRT 07/21/2017 to 07/26/2017. Nephrology has signed off since 07/29/2017.  Diabetes mellitus type 2: Continue sliding scale insulin.  Right buttock sacral pressure ulcer: We will re-consult wound care last time they saw him was on 07/30/2017, he  had spiked a temperature yesterday, he has remained afebrile.  Now wound is foul-smelling. CBC with differential is pending.  Dysphagia: Swallowing evaluation on 07/27/2018 recommended the patient to be n.p.o. continue oral care and ice chips.  Moderate protein caloric malnutrition:  Hypokalemia: Replete IV recheck a basic metabolic panel in the morning.  Hypomagnesemia: Magnesium continues to be low we will replete IV  Right buttock Sacral pressure ulcer: Wound RN. Santyl for enzymatic debridement of nonviable tissue, foam dressing is in place to protect from further injury. Pt is on a low airloss bed to reduce pressure  S/P BKA (below knee amputation) ([place])  New onset fever: Chest x-ray shows no new infiltrates, abdominal x-ray no signs of ileus the contrast is in the colon, urinalysis showed too many to count white blood cells and bacteria, will start him on IV Rocephin.  New CVA: HgbA1c, fasting lipid panel  MRI, MRA of the brain without contrast  PT, OT, Speech consult  Echocardiogram  Carotid dopplers  Prophylactic therapy-Antiplatelet med: Aspirin - dose 325 mg rectaly daily  Avoid D5 fluids as may be harmfull risk factor modification  Cardiac Monitoring  Neurochecks q4h  Keep MAP 70 tobacco counseling cessation. Place NFG tube, consult nutrition for enteral feeding.    DVT prophylaxis: lovenox Family Communication: None Disposition Plan/Barrier to D/C: Transfer to [place]. Code Status:     Code Status Orders  (From admission, onward)        Start     Ordered   07/17/17 1223  Full code  Continuous     07/17/17 1224  Code Status History    Date Active Date Inactive Code Status Order ID Comments User Context   06/16/2016 01:58 06/18/2016 16:21 Full Code [id number]  Andrew Bulls, MD ED   06/12/2012 15:09 06/14/2012 18:49 Full Code [id number]  Andrew Capers, RN Inpatient        IV Access:    Peripheral IV   Procedures and diagnostic  studies:   Dg Abd 1 View  Result Date: 08/07/2017 CLINICAL DATA:  Follow-up ileus EXAM: ABDOMEN - 1 VIEW COMPARISON:  07/30/2017 FINDINGS: Scattered large and small bowel gas is noted. Contrast material from a barium swallow is now noted throughout the colon. No obstructive changes are seen. The nasogastric catheter is not well appreciated. No free air is seen. No bony abnormality is noted. IMPRESSION: Previously administered contrast now lies within the colon. No obstructive changes are seen. Electronically Signed   By: Inez Catalina M.D.   On: 08/07/2017 09:00   Dg Chest Port 1 View  Result Date: 08/07/2017 CLINICAL DATA:  Fever.  Recent extubation EXAM: PORTABLE CHEST 1 VIEW August 03, 2017 FINDINGS: Endotracheal tube and nasogastric tube have been removed. No pneumothorax. There is consolidation in the left lower lobe with small left pleural effusion. There is mild right base atelectasis. Heart is upper normal in size with pulmonary vascularity within normal limits. No adenopathy. No evident bone lesions. IMPRESSION: Left lower lobe consolidation with small left pleural effusion. Mild right base atelectasis. Stable cardiac silhouette. Electronically Signed   By: Lowella Grip III M.D.   On: 08/07/2017 09:01     Medical Consultants:    None.  Anti-Infectives:   None  Subjective:    Andrew [name] now with a garbled speech today able to answer yes/no questions.   Objective:    Vitals:   08/08/17 0200 08/08/17 0300 08/08/17 0400 08/08/17 0600  BP: (!) 179/84 (!) 187/86 (!) 186/87 (!) 163/79  Pulse: (!) 108 (!) 106 (!) 109 (!) 113  Resp: (!) 42 (!) 40 (!) 39 (!) 31  Temp:   99.2 F (37.3 C)   TempSrc:   Axillary   SpO2: 93% 94% 94% 94%  Weight:    69 kg (152 lb 1.9 oz)  Height:        Intake/Output Summary (Last 24 hours) at 08/08/2017 0725 Last data filed at 08/08/2017 7353 Gross per 24 hour  Intake 310 ml  Output 2300 ml  Net -1990 ml   Filed Weights    08/06/17 2992 08/07/17 0600 08/08/17 0600  Weight: 72.5 kg (159 lb 13.3 oz) 72.1 kg (158 lb 15.2 oz) 69 kg (152 lb 1.9 oz)    Exam: General exam: In no acute distress. Respiratory system: Good air movement and clear to auscultation. Cardiovascular system: S1 & S2 heard, RRR. - JVD Gastrointestinal system: Abdomen is nondistended, soft and nontender.  Central nervous system: Alert and oriented x1 able to track, all 4 extremities are weak but it seems like his right side is weaker than the left. Extremities: No pedal edema. Skin: No rashes, lesions or ulcers   Data Reviewed:    Labs: Basic Metabolic Panel: Recent Labs  Lab 08/01/17 1701  08/04/17 0308 08/04/17 0933 08/05/17 0320 08/05/17 2044 08/06/17 4268 08/07/17 0334 08/08/17 0306  NA  --    < > 142  --  142 143 142 142 142  K  --    < > 2.9*  --  3.1* 3.3* 3.4* 3.0* 3.4*  CL  --    < >  113*  --  116* 117* 116* 116* 115*  CO2  --    < > 21*  --  20* 20* 21* 20* 19*  GLUCOSE  --    < > 103*  --  111* 115* 106* 117* 95  BUN  --    < > 23*  --  17 16 14 10 11   CREATININE  --    < > 0.93  --  0.90 0.85 0.81 0.76 0.76  CALCIUM  --    < > 7.8*  --  7.7* 7.7* 7.7* 7.7* 7.8*  MG 1.7  --  1.3* 1.3* 1.5*  --  1.5* 1.4* 1.6*  PHOS 3.2  --  2.9  --   --   --   --   --  3.5   < > = values in this interval not displayed.   GFR Estimated Creatinine Clearance: 97 mL/min (by C-G formula based on SCr of 0.76 mg/dL). Liver Function Tests: No results for input(s): AST, ALT, ALKPHOS, BILITOT, PROT, ALBUMIN in the last 168 hours. No results for input(s): LIPASE, AMYLASE in the last 168 hours. No results for input(s): AMMONIA in the last 168 hours. Coagulation profile No results for input(s): INR, PROTIME in the last 168 hours.  CBC: Recent Labs  Lab 08/03/17 0748 08/04/17 0308 08/05/17 0320 08/06/17 0619 08/07/17 0334  WBC 10.6* 7.1 4.8 5.1 7.4  HGB 7.9* 7.7* 7.6* 7.7* 8.4*  HCT 25.5* 24.2* 23.5* 24.1* 25.8*  MCV 91.1 89.6  88.0 88.3 88.1  PLT 157 160 179 161 175   Cardiac Enzymes: No results for input(s): CKTOTAL, CKMB, CKMBINDEX, TROPONINI in the last 168 hours. BNP (last 3 results) No results for input(s): PROBNP in the last 8760 hours. CBG: Recent Labs  Lab 08/07/17 1204 08/07/17 1542 08/07/17 1914 08/07/17 2316 08/08/17 0312  GLUCAP 108* 98 97 97 88   D-Dimer: No results for input(s): DDIMER in the last 72 hours. Hgb A1c: No results for input(s): HGBA1C in the last 72 hours. Lipid Profile: No results for input(s): CHOL, HDL, LDLCALC, TRIG, CHOLHDL, LDLDIRECT in the last 72 hours. Thyroid function studies: No results for input(s): TSH, T4TOTAL, T3FREE, THYROIDAB in the last 72 hours.  Invalid input(s): FREET3 Anemia work up: No results for input(s): VITAMINB12, FOLATE, FERRITIN, TIBC, IRON, RETICCTPCT in the last 72 hours. Sepsis Labs: Recent Labs  Lab 08/04/17 0308 08/05/17 0320 08/06/17 0619 08/07/17 0334  WBC 7.1 4.8 5.1 7.4   Microbiology Recent Results (from the past 240 hour(s))  C difficile quick scan w PCR reflex     Status: None   Collection Time: 07/29/17 11:25 AM  Result Value Ref Range Status   C Diff antigen NEGATIVE NEGATIVE Final   C Diff toxin NEGATIVE NEGATIVE Final   C Diff interpretation No C. difficile detected.  Final  Culture, blood (Routine X 2) w Reflex to ID Panel     Status: None   Collection Time: 07/30/17  9:09 AM  Result Value Ref Range Status   Specimen Description BLOOD RIGHT HAND  Final   Special Requests IN PEDIATRIC BOTTLE Blood Culture adequate volume  Final   Culture   Final    NO GROWTH 5 DAYS Performed at [facility], [place] [address] [place], [place] [address]    Report Status 08/04/2017 FINAL  Final  Culture, blood (Routine X 2) w Reflex to ID Panel     Status: None   Collection Time: 07/30/17  9:09 AM  Result Value Ref Range Status   Specimen Description BLOOD LEFT ARM  Final   Special Requests   Final    BOTTLES DRAWN  AEROBIC AND ANAEROBIC Blood Culture adequate volume   Culture   Final    NO GROWTH 5 DAYS Performed at [facility], [address], [place], [place] [address]    Report Status 08/04/2017 FINAL  Final  Urine Culture     Status: None   Collection Time: 07/30/17 11:57 AM  Result Value Ref Range Status   Specimen Description URINE, CATHETERIZED  Final   Special Requests NONE  Final   Culture   Final    NO GROWTH Performed at [facility], [place] [address], [place], [place] [address]    Report Status 07/31/2017 FINAL  Final  Culture, respiratory (NON-Expectorated)     Status: None   Collection Time: 07/30/17 11:49 PM  Result Value Ref Range Status   Specimen Description TRACHEAL ASPIRATE  Final   Special Requests NONE  Final   Gram Stain   Final    MODERATE WBC PRESENT, PREDOMINANTLY PMN RARE SQUAMOUS EPITHELIAL CELLS PRESENT RARE GRAM POSITIVE COCCI IN PAIRS    Culture   Final    RARE Consistent with normal respiratory flora. Performed at [facility], [place] [address] [place], [place] [address]    Report Status 08/02/2017 FINAL  Final  Culture, blood (Routine X 2) w Reflex to ID Panel     Status: None   Collection Time: 08/02/17 10:15 AM  Result Value Ref Range Status   Specimen Description BLOOD RIGHT HAND  Final   Special Requests IN PEDIATRIC BOTTLE Blood Culture adequate volume  Final   Culture   Final    NO GROWTH 5 DAYS Performed at [facility], [place] [address] [place], [place] [address]    Report Status 08/07/2017 FINAL  Final  Culture, blood (Routine X 2) w Reflex to ID Panel     Status: None   Collection Time: 08/02/17 10:25 AM  Result Value Ref Range Status   Specimen Description BLOOD RIGHT HAND  Final   Special Requests IN PEDIATRIC BOTTLE Blood Culture adequate volume  Final   Culture   Final    NO GROWTH 5 DAYS Performed at [facility], [place] [address], [place], [place] [address]    Report Status 08/07/2017 FINAL  Final    Culture, Urine     Status: None   Collection Time: 08/02/17  1:40 PM  Result Value Ref Range Status   Specimen Description URINE, RANDOM  Final   Special Requests NONE  Final   Culture   Final    NO GROWTH Performed at [facility], [place] [address], [place],  [address]    Report Status 08/03/2017 FINAL  Final     Medications:   . chlorhexidine gluconate (MEDLINE KIT)  15 mL Mouth Rinse BID  . collagenase   Topical Daily  . heparin injection (subcutaneous)  5,000 Units Subcutaneous Q8H  . insulin aspart  0-15 Units Subcutaneous Q4H  . LORazepam  2 mg Intravenous Q6H  . mouth rinse  15 mL Mouth Rinse QID  . metoprolol tartrate  5 mg Intravenous Q6H   Continuous Infusions: . dextrose 5 % and 0.45 % NaCl with KCl 20 mEq/L 10 mL/hr (08/07/17 1043)       LOS: 22 days   [facility] Pager [phone]  *Please  refer to [email or web address], password [email or web address] to get updated schedule on who will round on this patient, as hospitalists switch teams weekly. If 7PM-7AM, please contact night-coverage at [email or web address], password [email or web address] for any overnight needs.  08/08/2017, 7:25 AM

## 2017-08-08 NOTE — Consult Note (Signed)
Consultation Note Date: 08/08/2017   Patient Name: Andrew [name]  DOB: [birth year]  MRN: [id number]  Age / Sex: 62 y.o., male  PCP: Patient, No Pcp Per Referring Physician: Aileen Fass, Tammi Klippel, MD  Reason for Consultation: Establishing goals of care and Hospice Evaluation, EOL  HPI/Patient Profile: 62 y.o. male  with past medical history of HTN, HLD, DM, PAD, PVD, Lt BKA, GERD, anxiety, back pain, alcohol abuse, tobacco abuse admitted on 07/17/2017 with alcohol withdrawal. Hospital course complicated by aspiration pneumonia, sepsis, intubation 07-18-11/18 and 12/23-12/27 but now extubated, CRRT (renal function now normal), systolic CHF EF 35-32%. More stable now but with continued confusion and dysphagia with concern for stroke. Palliative care consulted for assistance with GOC discussion as patient is felt to be approaching EOL.   Clinical Assessment and Goals of Care: Mr. Archuleta continues to be confused and very difficult to understand although he is able to follow some simple commands. I reached out to his brother, Gerald Stabs, who RN says has been calling to check on Mr. Baca. Gerald Stabs tells me that his mother has dementia and it is himself and his sister as next of kin for his brother. Gerald Stabs tells me that he has already had a call from Dr. Aileen Fass requesting to meet with family today or tomorrow. Gerald Stabs tells me that he has been trying to remain optimistic but anticipates this meeting "has to do with the fact that he [patient] will not be leaving the hospital." Gerald Stabs is aware of his brother's poor prognosis.   I met later today at 80 with Mr. Saindon family (brother, sister-in-law, brother-in-law) while he slept. He is confused and unable to participate in [place]. Family, especially Gerald Stabs, are very tearful. They tell me that they have been anticipating this conversation as he has had very aggressive care in ICU without  much progressive. They see that he is not himself. They tell me that he was struggling very much at baseline with caring for himself and was becoming more confused with extreme emotions (happiness, sadness, angry) at home out of proportion to the situation. We discussed best and worst case scenarios as well as aggressive vs comfort care paths (including artificial feeding). At this time they are opting to pursue comfort care, DNR, NO feeding tubes, and transition out of ICU. Gerald Stabs tells me that he has been praying for peace for his brother, himself, and his family at this time this is his only hope. Emotional support provided. Will continue to follow and support patient and family.    Primary Decision Maker NEXT OF KIN Brother Gerald Stabs and sister Coralyn Mark (Terry's husband is here in her place)    SUMMARY OF RECOMMENDATIONS   DNR Full comfort care Transition to regular room, NO feeding tube, comfort feeds  Code Status/Advance Care Planning:  DNR   Symptom Management:   Pain/dyspnea: Morphine 1-2 mg IV every hour prn.   Anxiety: Continue Ativan 2 mg IV every 6 hours scheduled and 1-2 mg IV every 2 hours prn.   Delirium: Haldol  2 mg IV every 6 hours prn.   Secretions: Robinul 0.2 mg IV every 4 hours prn. May scheduled as needed.   Palliative Prophylaxis:   Aspiration, Bowel Regimen, Delirium Protocol, Frequent Pain Assessment, Oral Care, Palliative Wound Care and Turn Reposition  Additional Recommendations (Limitations, Scope, Preferences):  Full Comfort Care  Psycho-social/Spiritual:   Desire for further Chaplaincy support:yes  Additional Recommendations: Education on Hospice and Grief/Bereavement Support  Prognosis:   Likely days with no intake and multiple comorbidities.   Discharge Planning: Hospital death vs hospice facility.      Primary Diagnoses: Present on Admission: . TOBACCO ABUSE . Hypertension . Alcoholism ([place]) . Alcohol withdrawal ([place]) . Acute respiratory  failure with hypoxemia ([place])   I have reviewed the medical record, interviewed the patient and family, and examined the patient. The following aspects are pertinent.  Past Medical History:  Diagnosis Date  . Anxiety   . Back pain     The patient has chronic lumbar/thoracic back pain, he has kyphosis, mild facet hypertrophy at L1-L2, circumferential annular bulging and mild vertebral body osteophytic formation at L2-L3, mild to moderate stenosis of the medial aspect of the neural foramen at L4-L5, and mild annular bulging at L5-S1.  . Back pain   . Dental caries   . Diabetes mellitus    borderline yrs ago  . Dyslipidemia   . GERD (gastroesophageal reflux disease)   . Glucose intolerance (impaired glucose tolerance)    Max random CBG 130 (08/29/07) typically about 100. A1C in 11/08 was 5.4.  . History of alcohol abuse   . Hyperlipidemia   . Hypertension, essential   . Leg pain   . Peripheral arterial disease ([place])   . Peripheral vascular disease ([place])   . 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 Owens Shark).  Pt will likely require a wide resection and  has been refered to ENT (2/12)  . Thoracic kyphosis   . Tobacco abuse   . Ulcer    Social History   Socioeconomic History  . Marital status: Single    Spouse name: None  . Number of children: None  . Years of education: None  . Highest education level: None  Social Needs  . Financial resource strain: None  . Food insecurity - worry: None  . Food insecurity - inability: None  . Transportation needs - medical: None  . Transportation needs - non-medical: None  Occupational History  . None  Tobacco Use  . Smoking status: Current Every Day Smoker    Packs/day: 0.50    Years: 40.00    Pack years: 20.00    Types: Cigarettes  . Smokeless tobacco: Never Used  . Tobacco comment: trying to quit / 1PPD  Substance and Sexual Activity  . Alcohol use: Yes     Comment: 1/2 quart daily   . Drug use: No  . Sexual activity: None  Other Topics Concern  . None  Social History Narrative   Financial assistance approved for 100% discount at [facility] and has [facility] card per Bonna Gains   05/25/2010 Patient lives alone, has no car, is not using alcohol.         Family History  Problem Relation Age of Onset  . Heart disease Mother   . Cancer Mother   . Alcohol abuse Father   . Heart disease Father   . Stroke Father    Scheduled Meds: .  chlorhexidine gluconate (MEDLINE KIT)  15 mL Mouth Rinse BID  . collagenase   Topical Daily  . heparin injection (subcutaneous)  5,000 Units Subcutaneous Q8H  . insulin aspart  0-15 Units Subcutaneous Q4H  . LORazepam  2 mg Intravenous Q6H  . mouth rinse  15 mL Mouth Rinse QID  . metoprolol tartrate  5 mg Intravenous Q6H   Continuous Infusions: . cefTRIAXone (ROCEPHIN)  IV 1 g (08/08/17 1119)  . dextrose 5 % and 0.45 % NaCl with KCl 20 mEq/L 10 mL/hr (08/07/17 1043)  . magnesium sulfate 1 - 4 g bolus IVPB 4 g (08/08/17 1009)  . potassium chloride 10 mEq (08/08/17 1105)   PRN Meds:.acetaminophen **OR** acetaminophen, albuterol, fentaNYL (SUBLIMAZE) injection, haloperidol lactate, hydrALAZINE, labetalol, loperamide No Known Allergies Review of Systems  Unable to perform ROS: Acuity of condition    Physical Exam  Constitutional: He appears well-developed.  HENT:  Head: Normocephalic and atraumatic.  Cardiovascular: Regular rhythm. Tachycardia present.  Pulmonary/Chest: Effort normal. No accessory muscle usage. No tachypnea. No respiratory distress. He has decreased breath sounds.  Moderate clear oral secretions suctioned, weakened cough  Abdominal: Soft. Normal appearance. There is no tenderness.  Neurological: He is alert. He is disoriented.  Oriented to person only. Able to follow some simple commands inconsistently.   Nursing note and vitals reviewed.   Vital Signs: BP (!) 182/85   Pulse (!) 117    Temp 99 F (37.2 C) (Oral)   Resp (!) 40   Ht 6' (1.829 m)   Wt 69 kg (152 lb 1.9 oz)   SpO2 91%   BMI 20.63 kg/m  Pain Assessment: No/denies pain POSS *See Group Information*: 1-Acceptable,Awake and alert Pain Score: Asleep   SpO2: SpO2: 91 % O2 Device:SpO2: 91 % O2 Flow Rate: .O2 Flow Rate (L/min): 2 L/min  IO: Intake/output summary:   Intake/Output Summary (Last 24 hours) at 08/08/2017 1142 Last data filed at 08/08/2017 6184 Gross per 24 hour  Intake 70 ml  Output 2300 ml  Net -2230 ml    LBM: Last BM Date: 08/05/17 Baseline Weight: Weight: 68 kg (150 lb) Most recent weight: Weight: 69 kg (152 lb 1.9 oz)     Palliative Assessment/Data:    Time Total: 80 min  Greater than 50%  of this time was spent counseling and coordinating care related to the above assessment and plan.  Signed by: Vinie Sill, NP Palliative Medicine Team Pager # [phone] (M-F 8a-5p) Team Phone # [phone] (Nights/Weekends)

## 2017-08-08 NOTE — Progress Notes (Signed)
D.w Dr Olevia Bowens - patient off precedex 08/08/2017 - Might have new stroke. No need for pccm to round 08/08/2017 unless recalled  Dr. Brand Males, M.D., [facility].C.P Pulmonary and Critical Care Medicine Staff Physician, [place] Director - Interstitial Lung Disease  Program  Pulmonary [place] at [place], [place], [address]  Pager: [phone], If no answer or between  15:00h - 7:00h: call [phone] Telephone: [phone]

## 2017-08-09 LAB — BLOOD CULTURE ID PANEL (REFLEXED)
ACINETOBACTER BAUMANNII: NOT DETECTED
CANDIDA ALBICANS: NOT DETECTED
CANDIDA GLABRATA: NOT DETECTED
CANDIDA PARAPSILOSIS: NOT DETECTED
CANDIDA TROPICALIS: NOT DETECTED
Candida krusei: NOT DETECTED
ENTEROBACTER CLOACAE COMPLEX: NOT DETECTED
ENTEROBACTERIACEAE SPECIES: NOT DETECTED
ESCHERICHIA COLI: NOT DETECTED
Enterococcus species: NOT DETECTED
Haemophilus influenzae: NOT DETECTED
KLEBSIELLA OXYTOCA: NOT DETECTED
KLEBSIELLA PNEUMONIAE: NOT DETECTED
LISTERIA MONOCYTOGENES: NOT DETECTED
Neisseria meningitidis: NOT DETECTED
Proteus species: NOT DETECTED
Pseudomonas aeruginosa: NOT DETECTED
STREPTOCOCCUS PYOGENES: NOT DETECTED
Serratia marcescens: NOT DETECTED
Staphylococcus aureus (BCID): NOT DETECTED
Staphylococcus species: NOT DETECTED
Streptococcus agalactiae: NOT DETECTED
Streptococcus pneumoniae: NOT DETECTED
Streptococcus species: NOT DETECTED

## 2017-08-09 MED ORDER — LABETALOL HCL 5 MG/ML IV SOLN
10.0000 mg | INTRAVENOUS | Status: DC | PRN
  Filled 2017-08-09: qty 4

## 2017-08-09 MED ORDER — MORPHINE SULFATE (PF) 2 MG/ML IV SOLN
2.0000 mg | INTRAVENOUS | Status: DC | PRN
  Administered 2017-08-09: 2 mg via INTRAVENOUS
  Filled 2017-08-09: qty 1

## 2017-08-09 MED ORDER — COLLAGENASE 250 UNIT/GM EX OINT
TOPICAL_OINTMENT | Freq: Two times a day (BID) | CUTANEOUS | Status: DC
  Filled 2017-08-09: qty 90

## 2017-08-09 MED ORDER — METOPROLOL TARTRATE 5 MG/5ML IV SOLN
10.0000 mg | Freq: Four times a day (QID) | INTRAVENOUS | Status: DC
Start: 2017-08-09 — End: 2017-08-10
  Administered 2017-08-09 – 2017-08-10 (×5): 10 mg via INTRAVENOUS
  Filled 2017-08-09 (×5): qty 10

## 2017-08-09 NOTE — Progress Notes (Signed)
Family requesting comfort foods. Niece at bedside and  has been made aware of risks including aspiration. Dr. Venetia Constable made aware and order has been entered for comfort feeds.

## 2017-08-09 NOTE — Consult Note (Signed)
[place] Nurse wound consult note:  Follow up Asssessment Reason for Consult: Follow up assessment.  My partner, D. Barbie Haggis last saw patient on 07/30/17 Wound type: Pressure.  Deep tissue pressure injury (DTPI) in evolution. Now Unstageable. Pressure Injury POA: No Measurement:8cm x 7.5cm of erythema with partial thickness tissue loss with central area of firmly adherent white necrotic slough measuring 6.5cm x 4cm Wound bed: As described above Drainage (amount, consistency, odor) scant serous exudate, odor consistent with necrotic tissue Periwound: Macerated with erythema and partial thickness tissue loss as noted above Dressing procedure/placement/frequency: I will increase the frequency of the collagenase (Santyl) ointment to twice daily and add a mattress replacement with low air loss feature for comfort as well as therapeutic value.  [place] nursing team will not follow daily, but will remain available to this patient, the nursing and medical teams and see weekly.  Please re-consult if needed in between visits. Thank you, Maudie Flakes, MSN, RN, Chancy Milroy, Arther Abbott  Pager# [phone]

## 2017-08-09 NOTE — Progress Notes (Signed)
Nutrition Brief Note  RD consulted for TF initiation and management.   Chart reviewed. Pt now transitioning to comfort care.  No further nutrition interventions warranted at this time.   Clayton Bibles, MS, RD, [place] Dietitian Pager: [phone] After Hours Pager: [phone]

## 2017-08-09 NOTE — Progress Notes (Signed)
Daily Progress Note   Patient Name: Andrew [name]       Date: 08/09/2017 DOB: 1956-01-31  Age: 62 y.o. MRN#: [id number] Attending Physician: Charlynne Cousins, MD Primary Care Physician: Patient, No Pcp Per Admit Date: 07/17/2017  Reason for Consultation/Follow-up: Establishing goals of care and Hospice Evaluation  Subjective: Andrew [name] is lying in bed. Breathing is labored.   Length of Stay: 23  Current Medications: Scheduled Meds:  . chlorhexidine gluconate (MEDLINE KIT)  15 mL Mouth Rinse BID  . collagenase   Topical Daily  . LORazepam  2 mg Intravenous Q6H  . mouth rinse  15 mL Mouth Rinse QID  . metoprolol tartrate  5 mg Intravenous Q6H    Continuous Infusions:   PRN Meds: acetaminophen **OR** acetaminophen, albuterol, glycopyrrolate, haloperidol lactate, labetalol, loperamide, LORazepam, morphine injection, polyvinyl alcohol  Physical Exam         Constitutional: He appears well-developed.  HENT:  Head: Normocephalic and atraumatic.  Cardiovascular: Regular rhythm. Tachycardia present.  Pulmonary/Chest: Effort normal. No accessory muscle usage. Tachypnea. Mod respiratory distress. He has decreased breath sounds.  Moderate clear oral secretions suctioned, weakened cough  Abdominal: Soft. Normal appearance. There is no tenderness.  Neurological: He is alert. He is disoriented.  Oriented to person only. Able to follow some simple commands inconsistently.   Nursing note and vitals reviewed.   Vital Signs: BP (!) 185/90 (BP Location: Left Arm)   Pulse (!) 115   Temp 99.4 F (37.4 C) (Axillary)   Resp 18   Ht 6' (1.829 m)   Wt 70.8 kg (156 lb 1.4 oz)   SpO2 98%   BMI 21.17 kg/m  SpO2: SpO2: 98 % O2 Device: O2 Device: Nasal Cannula O2 Flow Rate: O2 Flow Rate  (L/min): 2 L/min  Intake/output summary:   Intake/Output Summary (Last 24 hours) at 08/09/2017 1009 Last data filed at 08/09/2017 0945 Gross per 24 hour  Intake -  Output 1215 ml  Net -1215 ml   LBM: Last BM Date: 08/08/17 Baseline Weight: Weight: 68 kg (150 lb) Most recent weight: Weight: 70.8 kg (156 lb 1.4 oz)       Palliative Assessment/Data: 20%     Patient Active Problem List   Diagnosis Date Noted  . Dysphagia   . Palliative care encounter   .  Acute encephalopathy   . Pressure injury of skin 07/31/2017  . Septic shock (HCC)   . AKI (acute kidney injury) (HCC)   . Alcohol intoxication, uncomplicated (HCC)   . Hypertensive crisis 07/17/2017  . Acute respiratory failure with hypoxemia (HCC) 07/17/2017  . Alcoholism (HCC) 06/16/2016  . Alcohol withdrawal (HCC) 06/16/2016  . Kidney disease 06/16/2016  . Leukocytosis 06/16/2016  . Alcoholic ketoacidosis 06/16/2016  . S/P BKA (below knee amputation) (HCC) 12/14/2012  . Abnormality of gait 11/10/2012  . Preventative health care 09/15/2010  . 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 CHEST PAIN 03/04/2010  . 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 OTHER DENTAL CARIES 03/14/2008  . DYSLIPIDEMIA 05/10/2006  . Depression with anxiety 05/10/2006  . TOBACCO ABUSE 05/10/2006  . Hypertension 05/10/2006  . PERIPHERAL VASCULAR DISEASE 05/10/2006  . BACK PAIN 05/10/2006  . 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 GLUCOSE INTOLERANCE, HX OF 05/10/2006    Palliative Care Assessment & Plan   HPI: 61 y.o. male  with past medical history of HTN, HLD, DM, PAD, PVD, Lt BKA, GERD, anxiety, back pain, alcohol abuse, tobacco abuse admitted on 07/17/2017 with alcohol withdrawal. Hospital course complicated by aspiration pneumonia, sepsis, intubation 07-18-11/18 and 12/23-12/27 but now extubated, CRRT (renal function now normal), systolic CHF EF 20-25%. More stable now but with continued confusion and dysphagia with  concern for stroke. Palliative care consulted for assistance with GOC discussion as patient is felt to be approaching EOL.   Assessment: Andrew [name] is resting but awakens to my voice. He was unable to speak until I suctioned and cleaned his mouth. When I ask about pain he tells me "a little bit." Otherwise he is very difficult to understand when he speaks.   I spoke with RN and requested pain medication that will assist with breathing as well. Goal is for comfort. Discussed plan with RN and the need for frequent oral care for comfort.   I did hear back from brother, [name]. We spoke further about his brother's status and the benefits of considering hospice facility placement. [name] is not very happy with the idea of moving his brother (solely because they don't want to put him through a physical move). However, [name] also says that himself and his family understand if this needs to happen (even though their preference is that he remains where he is).   Recommendations/Plan:  Pain/dyspnea: Morphine 1-2 mg IV every hour prn.   Anxiety: Continue Ativan 2 mg IV every 6 hours scheduled and 1-2 mg IV every 2 hours prn.   Delirium: Haldol 2 mg IV every 6 hours prn.   Secretions: Robinul 0.2 mg IV every 4 hours prn. May scheduled as needed.    Goals of Care and Additional Recommendations:  Limitations on Scope of Treatment: Full Comfort Care  Code Status:  DNR  Prognosis:   Hours - Days  Discharge Planning:  Hospice facility  Thank you for allowing the Palliative Medicine Team to assist in the care of this patient.   Total Time 35 min Prolonged Time Billed  no       Greater than 50%  of this time was spent counseling and coordinating care related to the above assessment and plan.   , NP Palliative Medicine Team Pager # [phone] (M-F 8a-5p) Team Phone # [phone] (Nights/Weekends)      

## 2017-08-09 NOTE — Progress Notes (Signed)
[facility] PROGRESS NOTE    Progress Note  Andrew [name]  ZOX:[id number] DOB: Sep 30, 1955 DOA: 07/17/2017 PCP: Patient, No Pcp Per     Brief Narrative:   Andrew [name] is an 62 y.o. male with past medical history significant for alcohol abuse who presented to the [facility] long emergency department on the day of admission due to alcohol withdrawal. He stated that he drank a significant amount of vodka per day and his last drink of alcohol was at approximately 11 PM he was treated with Ativan but Due to decompensation, he was intubated on 12/10 and admitted to ICU. Hospitalization has been complicated by delirium, AKI requiring CRRT. [facility] assumed care 12/30.   He was taken off the Precedex, and by the next day he was slightly more awake able to interact as he had a garbled speech is CT scan of the head was done that showed a new stroke.  The patient could not communicate his wishes at that point.  Speech was consulted and deemed in high risk for aspiration.  Family was called and it was discussed with the next of kin, that due to his multiple comorbidities a consult with hospice and palliative care was needed which they agreed.  Palliative care met with family and due to his multiple comorbidities it was decided to move towards comfort care.    Assessment/Plan:   Acute delirium due to alcohol withdrawal/  Alcohol intoxication, uncomplicated (Show Low): He remains off Precedex for 24 hours.  Has is now appropriate tracking and responding to yes and no questions. Will use Haldol for agitation. Fentanyl for pain. Now resolved.  Patient does not realize the extent of his medical condition  Acute respiratory failure with hypoxia due to aspiration pneumonia and pulmonary edema: Intubated on 07/17/2017 extubated on 07/25/2017, reintubated on 07/30/2017 and extubated on 08/03/2017 Now currently on room air.  Septic shock secondary to aspiration pneumonia: Culture negative till date, C. difficile  was negative. He is completed a course of antibiotics.  Acute systolic heart failure: With an EF of 20% likely due to alcohol abuse. We will be judicious with IV fluids.  We will KVO them for now.  Acute kidney injury with ATN: Required C RRT 07/21/2017 to 07/26/2017. Nephrology has signed off since 07/29/2017.  Diabetes mellitus type 2: Continue sliding scale insulin.  Right buttock sacral pressure ulcer: We will re-consult wound care last time they saw him was on 07/30/2017, he had spiked a temperature yesterday, he has remained afebrile.  Foul-smelling odor due to necrotic tissue Wound care was consulted that recommended Santyl ointment and low air mattress for comfort.  Dysphagia: Swallowing evaluation on 07/27/2018 recommended the patient to be n.p.o. due to his high risk of aspiration.   Meeting with family and the patient they decided to move towards comfort cares would allow comfort feeds.  Moderate protein caloric malnutrition:  Hypokalemia: Replete IV recheck a basic metabolic panel in the morning.  Hypomagnesemia: Magnesium continues to be low we will replete IV  S/P BKA (below knee amputation) (HCC)  New onset fever: Chest x-ray shows no new infiltrates, abdominal x-ray no signs of ileus the contrast is in the colon, urinalysis showed too many to count white blood cells and bacteria. After discussion with family they decided to move towards comfort care so antibiotics were stopped.  New CVA: On CT scan on 08/08/2017.  Goals of care: The family met with hospice and palliative care and they related that he was struggling a lot  and was not able to take care for himself, after talking with palliative care team made the patient a DNR, they required no tube feedings as the patient would have not like this.  His brother tells palliative care that he is had a lot of very destructive years.  They are not sure where the patient would go whether to a residential hospice  facility or with them. Due to his new CVA, history of multiple vascular interventions, below the knee amputation, stage III sacral decubitus ulcer, severe  Dysphagia and high risk of aspiration they decided to move towards full comfort care. At this point they decided not to escalate care stop the antibiotics. We will re-meet with hospice and palliative care to determine how to proceed.   DVT prophylaxis: lovenox Family Communication: None Disposition Plan/Barrier to D/C: d/c once meeting with pmt on were to be d/c Code Status:     Code Status Orders  (From admission, onward)        Start     Ordered   07/17/17 1223  Full code  Continuous     07/17/17 1224    Code Status History    Date Active Date Inactive Code Status Order ID Comments User Context   06/16/2016 01:58 06/18/2016 16:21 Full Code [id number]  [name], MD ED   06/12/2012 15:09 06/14/2012 18:49 Full Code [id number]  [name], RN Inpatient        IV Access:    Peripheral IV   Procedures and diagnostic studies:   Ct Head Wo Contrast  Result Date: 08/08/2017 CLINICAL DATA:  Facial weakness.  Alcohol withdrawal. EXAM: CT HEAD WITHOUT CONTRAST TECHNIQUE: Contiguous axial images were obtained from the base of the skull through the vertex without intravenous contrast. COMPARISON:  None. FINDINGS: Brain: Image quality degraded by motion. Moderate atrophy. Negative for hydrocephalus. Patchy hypodensity in the cerebral white matter bilaterally, consistent with chronic microvascular ischemia. Hypodensity in the right posterior basal ganglia, suspicious for acute infarct. Confirmed with MRI. Hypodensity left cerebellum compatible with artifact based on coronal and sagittal images. Negative for hemorrhage or mass. Vascular: Negative for hyperdense vessel Skull: Negative Sinuses/Orbits: Mild mucosal edema left maxillary sinus. Negative orbit Other: None IMPRESSION: Hypodensity right posterior basal ganglia, suspicious  for acute infarct. Recommend MRI to confirm. Atrophy and chronic microvascular ischemia Image quality degraded by motion. Electronically Signed   By: Franchot Gallo M.D.   On: 08/08/2017 09:58     Medical Consultants:    None.  Anti-Infectives:   None  Subjective:    Andrew [name] now with a garbled speech today able to answer yes/no questions.   Objective:    Vitals:   08/08/17 1607 08/08/17 2141 08/09/17 0051 08/09/17 0508  BP: (!) 171/87 (!) 186/79 (!) 177/73 (!) 185/90  Pulse: (!) 109 (!) 119 (!) 108 (!) 115  Resp: 20 (!) 26  18  Temp: 99 F (37.2 C) 98.9 F (37.2 C)  99.4 F (37.4 C)  TempSrc: Oral Oral  Axillary  SpO2: 99% 99%  98%  Weight: 70.8 kg (156 lb 1.4 oz)     Height:        Intake/Output Summary (Last 24 hours) at 08/09/2017 1111 Last data filed at 08/09/2017 0945 Gross per 24 hour  Intake -  Output 1215 ml  Net -1215 ml   Filed Weights   08/07/17 0600 08/08/17 0600 08/08/17 1607  Weight: 72.1 kg (158 lb 15.2 oz) 69 kg (152 lb 1.9 oz) 70.8  kg (156 lb 1.4 oz)    Exam: General exam: In no acute distress. Respiratory system: Good air movement and clear to auscultation. Cardiovascular system: S1 & S2 heard, RRR. - JVD Gastrointestinal system: Abdomen is nondistended, soft and nontender.  Central nervous system: Alert and oriented x1 able to track, all 4 extremities are weak but it seems like his right side is weaker than the left. Extremities: Right below the knee amputation Skin: No rashes, lesions or ulcers   Data Reviewed:    Labs: Basic Metabolic Panel: Recent Labs  Lab 08/04/17 0308 08/04/17 0933 08/05/17 0320 08/05/17 2044 08/06/17 0619 08/07/17 0334 08/08/17 0306  NA 142  --  142 143 142 142 142  K 2.9*  --  3.1* 3.3* 3.4* 3.0* 3.4*  CL 113*  --  116* 117* 116* 116* 115*  CO2 21*  --  20* 20* 21* 20* 19*  GLUCOSE 103*  --  111* 115* 106* 117* 95  BUN 23*  --  17 16 14 10 11   CREATININE 0.93  --  0.90 0.85 0.81 0.76 0.76    CALCIUM 7.8*  --  7.7* 7.7* 7.7* 7.7* 7.8*  MG 1.3* 1.3* 1.5*  --  1.5* 1.4* 1.6*  PHOS 2.9  --   --   --   --   --  3.5   GFR Estimated Creatinine Clearance: 99.6 mL/min (by C-G formula based on SCr of 0.76 mg/dL). Liver Function Tests: No results for input(s): AST, ALT, ALKPHOS, BILITOT, PROT, ALBUMIN in the last 168 hours. No results for input(s): LIPASE, AMYLASE in the last 168 hours. No results for input(s): AMMONIA in the last 168 hours. Coagulation profile No results for input(s): INR, PROTIME in the last 168 hours.  CBC: Recent Labs  Lab 08/04/17 0308 08/05/17 0320 08/06/17 0619 08/07/17 0334 08/08/17 0733  WBC 7.1 4.8 5.1 7.4 10.3  NEUTROABS  --   --   --   --  7.9*  HGB 7.7* 7.6* 7.7* 8.4* 9.3*  HCT 24.2* 23.5* 24.1* 25.8* 29.9*  MCV 89.6 88.0 88.3 88.1 88.2  PLT 160 179 161 175 242   Cardiac Enzymes: No results for input(s): CKTOTAL, CKMB, CKMBINDEX, TROPONINI in the last 168 hours. BNP (last 3 results) No results for input(s): PROBNP in the last 8760 hours. CBG: Recent Labs  Lab 08/07/17 1914 08/07/17 2316 08/08/17 0312 08/08/17 0724 08/08/17 1143  GLUCAP 97 97 88 88 101*   D-Dimer: No results for input(s): DDIMER in the last 72 hours. Hgb A1c: No results for input(s): HGBA1C in the last 72 hours. Lipid Profile: No results for input(s): CHOL, HDL, LDLCALC, TRIG, CHOLHDL, LDLDIRECT in the last 72 hours. Thyroid function studies: No results for input(s): TSH, T4TOTAL, T3FREE, THYROIDAB in the last 72 hours.  Invalid input(s): FREET3 Anemia work up: No results for input(s): VITAMINB12, FOLATE, FERRITIN, TIBC, IRON, RETICCTPCT in the last 72 hours. Sepsis Labs: Recent Labs  Lab 08/05/17 0320 08/06/17 0619 08/07/17 0334 08/08/17 0733  WBC 4.8 5.1 7.4 10.3   Microbiology Recent Results (from the past 240 hour(s))  Urine Culture     Status: None   Collection Time: 07/30/17 11:57 AM  Result Value Ref Range Status   Specimen Description URINE,  CATHETERIZED  Final   Special Requests NONE  Final   Culture   Final    NO GROWTH Performed at [facility], [address] [place], [place] [address]    Report Status 07/31/2017 FINAL  Final  Culture, respiratory (NON-Expectorated)     Status: None   Collection Time: 07/30/17 11:49 PM  Result Value Ref Range Status   Specimen Description TRACHEAL ASPIRATE  Final   Special Requests NONE  Final   Gram Stain   Final    MODERATE WBC PRESENT, PREDOMINANTLY PMN RARE SQUAMOUS EPITHELIAL CELLS PRESENT RARE GRAM POSITIVE COCCI IN PAIRS    Culture   Final    RARE Consistent with normal respiratory flora. Performed at [facility], [place] [address] [place], [place] [address]    Report Status 08/02/2017 FINAL  Final  Culture, blood (Routine X 2) w Reflex to ID Panel     Status: None   Collection Time: 08/02/17 10:15 AM  Result Value Ref Range Status   Specimen Description BLOOD RIGHT HAND  Final   Special Requests IN PEDIATRIC BOTTLE Blood Culture adequate volume  Final   Culture   Final    NO GROWTH 5 DAYS Performed at [facility], [place] [address] [place], [place] [address]    Report Status 08/07/2017 FINAL  Final  Culture, blood (Routine X 2) w Reflex to ID Panel     Status: None   Collection Time: 08/02/17 10:25 AM  Result Value Ref Range Status   Specimen Description BLOOD RIGHT HAND  Final   Special Requests IN PEDIATRIC BOTTLE Blood Culture adequate volume  Final   Culture   Final    NO GROWTH 5 DAYS Performed at [facility], [place] [address] [place], [place] [address]    Report Status 08/07/2017 FINAL  Final  Culture, Urine     Status: None   Collection Time: 08/02/17  1:40 PM  Result Value Ref Range Status   Specimen Description URINE, RANDOM  Final   Special Requests NONE  Final   Culture   Final    NO GROWTH Performed at [facility], [place] [address] [place], [place] [address]    Report Status 08/03/2017 FINAL  Final  Urine Culture      Status: Abnormal (Preliminary result)   Collection Time: 08/07/17  7:44 AM  Result Value Ref Range Status   Specimen Description URINE, RANDOM  Final   Special Requests NONE  Final   Culture (A)  Final    >=100,000 COLONIES/mL KLEBSIELLA OXYTOCA SUSCEPTIBILITIES TO FOLLOW Performed at [facility], [place] [address], [place], [place] [address]    Report Status PENDING  Incomplete  Culture, blood (Routine X 2) w Reflex to ID Panel     Status: None (Preliminary result)   Collection Time: 08/07/17  8:05 AM  Result Value Ref Range Status   Specimen Description BLOOD LEFT ANTECUBITAL  Final   Special Requests   Final    BOTTLES DRAWN AEROBIC AND ANAEROBIC Blood Culture adequate volume   Culture   Final    NO GROWTH 1 DAY Performed at [facility], [place] [address] [place], [place] [address]    Report Status PENDING  Incomplete  Culture, blood (Routine X 2) w Reflex to ID Panel     Status: None (Preliminary result)   Collection Time: 08/07/17  8:06 AM  Result Value Ref Range Status   Specimen Description BLOOD RIGHT HAND  Final   Special Requests   Final    BOTTLES DRAWN AEROBIC ONLY Blood Culture adequate volume   Culture   Final    NO GROWTH 1 DAY Performed at [facility], [place]  [address] [place], [place] [address]    Report Status PENDING  Incomplete     Medications:   . LORazepam  2 mg Intravenous Q6H  . metoprolol tartrate  5 mg Intravenous Q6H   Continuous Infusions:      LOS: 23 days   [facility] Pager [phone]  *Please refer to [email or web address], password [email or web address] to get updated schedule on who will round on this patient, as hospitalists switch teams weekly. If 7PM-7AM, please contact night-coverage at [email or web address], password [email or web address] for any overnight needs.  08/09/2017, 11:11 AM

## 2017-08-09 NOTE — Progress Notes (Signed)
PHARMACY - PHYSICIAN COMMUNICATION CRITICAL VALUE ALERT - BLOOD CULTURE IDENTIFICATION (BCID)  Andrew [name] is an 62 y.o. male who presented to [facility] on 07/17/2017 with a chief complaint of alcohol withdrawal, acute respiratory failure.  Assessment:  Patient is now full comfort care  Antimicrobials this admission:  12/10 unasyn >> 12/14 12/14 vanco >> 12/16 12/14 cefepime >> 12/19  Recent microbiology results: 12/31 Urine culture >100k Klebsiella oxytoca (sensitivities pending)  Name of physician (or Provider) Contacted: Forrest Moron, NP  Current antibiotics: none  Changes to prescribed antibiotics recommended: continue to hold antibiotics since comfort care  Results for orders placed or performed during the hospital encounter of 07/17/17  Blood Culture ID Panel (Reflexed) (Collected: 08/07/2017  8:05 AM)  Result Value Ref Range   Enterococcus species NOT DETECTED NOT DETECTED   Listeria monocytogenes NOT DETECTED NOT DETECTED   Staphylococcus species NOT DETECTED NOT DETECTED   Staphylococcus aureus NOT DETECTED NOT DETECTED   Streptococcus species NOT DETECTED NOT DETECTED   Streptococcus agalactiae NOT DETECTED NOT DETECTED   Streptococcus pneumoniae NOT DETECTED NOT DETECTED   Streptococcus pyogenes NOT DETECTED NOT DETECTED   Acinetobacter baumannii NOT DETECTED NOT DETECTED   Enterobacteriaceae species NOT DETECTED NOT DETECTED   Enterobacter cloacae complex NOT DETECTED NOT DETECTED   Escherichia coli NOT DETECTED NOT DETECTED   Klebsiella oxytoca NOT DETECTED NOT DETECTED   Klebsiella pneumoniae NOT DETECTED NOT DETECTED   Proteus species NOT DETECTED NOT DETECTED   Serratia marcescens NOT DETECTED NOT DETECTED   Haemophilus influenzae NOT DETECTED NOT DETECTED   Neisseria meningitidis NOT DETECTED NOT DETECTED   Pseudomonas aeruginosa NOT DETECTED NOT DETECTED   Candida albicans NOT DETECTED NOT DETECTED   Candida glabrata NOT DETECTED NOT DETECTED   Candida krusei NOT DETECTED NOT DETECTED   Candida parapsilosis NOT DETECTED NOT DETECTED   Candida tropicalis NOT DETECTED NOT DETECTED    Peggyann Juba, PharmD, BCPS 08/09/2017  7:27 PM

## 2017-08-09 NOTE — Progress Notes (Signed)
CSW following for disposition needs- pt has transitioned to full comfort care.  Spoke with pt's brother to assess family needs. He states family has deliberated over possibly wanting to transition pt to hospice facility, "but is concerned because we are all very near the hospital and are not near the hospice home," also expresses concern "what if he is not even well enough to make it there."  CSW assured brother that stability to transfer to hospice facility could be assessed daily. Brother advises that if pt stable tomorrow family would likely be interested in referral to hospice home then. CSW will follow.  Sharren Bridge, MSW, LCSW Clinical Social Work 08/09/2017 [phone]

## 2017-08-10 DIAGNOSIS — G9340 Encephalopathy, unspecified: Secondary | ICD-10-CM

## 2017-08-10 LAB — URINE CULTURE: Culture: >=100000 — AB

## 2017-08-10 MED ORDER — HALOPERIDOL LACTATE 5 MG/ML IJ SOLN: 2.0000 mg | Freq: Four times a day (QID) | INTRAMUSCULAR | Status: AC | PRN

## 2017-08-10 MED ORDER — ACETAMINOPHEN 650 MG RE SUPP: 650.0000 mg | RECTAL | 0 refills | Status: AC | PRN

## 2017-08-10 MED ORDER — MORPHINE SULFATE (PF) 2 MG/ML IV SOLN: 2.0000 mg | INTRAVENOUS | 0 refills | Status: AC | PRN

## 2017-08-10 MED ORDER — LORAZEPAM 2 MG/ML IJ SOLN: 2.0000 mg | Freq: Four times a day (QID) | INTRAMUSCULAR | 0 refills | Status: AC

## 2017-08-10 MED ORDER — POLYVINYL ALCOHOL 1.4 % OP SOLN: 1.0000 [drp] | Freq: Four times a day (QID) | OPHTHALMIC | 0 refills | Status: AC | PRN

## 2017-08-10 MED ORDER — LORAZEPAM 2 MG/ML IJ SOLN
1.0000 mg | INTRAMUSCULAR | 0 refills | Status: AC | PRN
Start: 2017-08-10 — End: ?

## 2017-08-10 NOTE — Care Management Important Message (Signed)
Important Message  Patient Details  Name: Andrew [name] MRN: [id number] Date of Birth: Apr 24, 1956   Medicare Important Message Given:  Yes    Kerin Salen 08/10/2017, 11:26 AMImportant Message  Patient Details  Name: Andrew [name] MRN: [id number] Date of Birth: 1956-04-03   Medicare Important Message Given:  Yes    Kerin Salen 08/10/2017, 11:26 AM

## 2017-08-10 NOTE — Progress Notes (Signed)
Pt admitting to [facility] today. Report [phone]  Arranged [facility] transportation.  Spoke with brother Gerald Stabs - completed paperwork with [facility] and family agreed to plan.   Sharren Bridge, MSW, LCSW Clinical Social Work 08/10/2017 [phone]

## 2017-08-10 NOTE — Progress Notes (Signed)
Spoke with pt's family this morning- have agreed to pursue residential hospice care for pt. Discussed with palliative as well. Discussed options and preference is for [facility]. CSW made referral and will follow.   Sharren Bridge, MSW, LCSW Clinical Social Work 08/10/2017 [phone]

## 2017-08-10 NOTE — Progress Notes (Signed)
[facility] Liaison: RN visit  Received request from Sharren Bridge, [place]  for family interest in [facility] with request for transfer today. Chart reviewed. Met with Berniece Pap, patient's brother,  to confirm interest and explain services. Family agreeable to transfer today. Meghan Stout,CSW aware. Registration paper work completed. Dr. Orpah Melter to assume care per family request.  Please fax discharge summary to [phone]. RN please call report to [phone]. Please arrange transport for patient to arrive as soon as possible.   Please call with any hospice related questions.   Thank you.  Farrel Gordon, RN, [facility] Liaison [phone]  All hospital liaisons are on [place].

## 2017-08-10 NOTE — Discharge Summary (Signed)
Physician Discharge Summary  Andrew [name]  YJE:[id number]  DOB: Dec 20, 1955  DOA: 07/17/2017 PCP: Patient, No Pcp Per  Admit date: 07/17/2017 Discharge date: 08/10/2017  Admitted From: Home  Disposition:  Hospice facility   Recommendations for Outpatient Follow-up:  1. Full comfort care   Discharge Condition: Hospice  CODE STATUS: DNR  Diet recommendation: Regular for comfort feeds - high risk of aspiration   Brief/Interim Summary:  Andrew [name] is an 62 y.o. male withpast medical history significant for alcohol abuse who presented to the [facility] long emergency department on the day of admission due to alcohol withdrawal. He stated that he drank a significant amount of vodka per day and his last drink of alcohol was at approximately 11 PM he was treated with Ativan butDue to decompensation, he was intubated on 12/10 and admitted to ICU. Hospitalization has been complicated by delirium, AKI requiring CRRT. TRH assumed care 12/30.  He was taken off the Precedex, and by the next day he was slightly more awake able to interact as he had a garbled speech is CT scan of the head was done that showed a new stroke.  The patient could not communicate his wishes at that point.  Speech was consulted and deemed in high risk for aspiration.  Family was called and it was discussed with the next of kin, that due to his multiple comorbidities a consult with hospice and palliative care was needed which they agreed.  Palliative care met with family and due to his multiple comorbidities it was decided to move towards comfort care.  Subjective: Patient seen and examined, respond to yes or no questions only. No acute events overnight. Patient comfortable.   Discharge Diagnoses/Hospital Course:  Acute delirium due to alcohol withdrawal/  Alcohol intoxication, uncomplicated Treated with Precedex and intubation  Now resolved.  Patient does not realize the extent of his medical condition  Acute respiratory  failure with hypoxia due to aspiration pneumonia and pulmonary edema: Intubated on 07/17/2017 extubated on 07/25/2017, reintubated on 07/30/2017 and extubated on 08/03/2017 Now currently on room air.  Septic shock secondary to aspiration pneumonia: Culture negative till date, C. difficile was negative. He is completed a course of antibiotics.  Acute systolic heart failure: With an EF of 20% likely due to alcohol abuse. No further treatment as patient is in comfort measures  Can use Lasix if signs of fluid overload for palliative purposes, upon discharge patient seems to be compensated     Acute kidney injury with ATN: Required C RRT 07/21/2017 to 07/26/2017.  Diabetes mellitus type 2: CBG stable on SSI   Right buttock sacral pressure ulcer: Foul-smelling odor due to necrotic tissue. Wound care was consulted that recommended Santyl ointment and low air mattress for comfort.  Dysphagia: Swallowing evaluation on 07/27/2018 recommended the patient to be n.p.o. due to his high risk of aspiration.    Moderate protein caloric malnutrition:   S/P BKA (below knee amputation) ([place])  New CVA: On CT scan on 08/08/2017. No further workup, patient on comfort measures   Goals of care: Due to his new CVA, history of multiple vascular interventions, below the knee amputation, stage III sacral decubitus ulcer, severe  Dysphagia and high risk of aspiration family decided to move towards full comfort care. Family opted for hospice facility   Discharge Instructions  You were cared for by a hospitalist during your hospital stay. If you have any questions about your discharge medications or the care you received while you were in  the hospital after you are discharged, you can call the unit and asked to speak with the hospitalist on call if the hospitalist that took care of you is not available. Once you are discharged, your primary care physician will handle any further medical issues.  Please note that NO REFILLS for any discharge medications will be authorized once you are discharged, as it is imperative that you return to your primary care physician (or establish a relationship with a primary care physician if you do not have one) for your aftercare needs so that they can reassess your need for medications and monitor your lab values.  Discharge Instructions    Call MD for:  difficulty breathing, headache or visual disturbances   Complete by:  As directed    Call MD for:  extreme fatigue   Complete by:  As directed    Call MD for:  hives   Complete by:  As directed    Call MD for:  persistant dizziness or light-headedness   Complete by:  As directed    Call MD for:  persistant nausea and vomiting   Complete by:  As directed    Call MD for:  redness, tenderness, or signs of infection (pain, swelling, redness, odor or green/yellow discharge around incision site)   Complete by:  As directed    Call MD for:  severe uncontrolled pain   Complete by:  As directed    Call MD for:  temperature >100.4   Complete by:  As directed      Allergies as of 08/10/2017   No Known Allergies     Medication List    STOP taking these medications   aspirin EC 81 MG tablet   azithromycin 250 MG tablet Commonly known as:  ZITHROMAX   B COMPLEX FORMULA 1 PO   chlordiazePOXIDE 25 MG capsule Commonly known as:  LIBRIUM   clonazePAM 1 MG tablet Commonly known as:  KLONOPIN   feeding supplement (ENSURE ENLIVE) Liqd   folic acid 1 MG tablet Commonly known as:  FOLVITE   gabapentin 300 MG capsule Commonly known as:  NEURONTIN   HYDROcodone-acetaminophen 10-325 MG tablet Commonly known as:  NORCO   lisinopril-hydrochlorothiazide 20-25 MG tablet Commonly known as:  PRINZIDE,ZESTORETIC   nicotine 21 mg/24hr patch Commonly known as:  NICODERM CQ - dosed in mg/24 hours   pantoprazole 40 MG tablet Commonly known as:  PROTONIX   sertraline 100 MG tablet Commonly known as:   ZOLOFT   thiamine 100 MG tablet   traMADol 50 MG tablet Commonly known as:  ULTRAM     TAKE these medications   acetaminophen 650 MG suppository Commonly known as:  TYLENOL Place 1 suppository (650 mg total) rectally every 4 (four) hours as needed for fever.   haloperidol lactate 5 MG/ML injection Commonly known as:  HALDOL Inject 0.4 mLs (2 mg total) into the vein every 6 (six) hours as needed.   lisinopril 20 MG tablet Commonly known as:  PRINIVIL,ZESTRIL Take 1 tablet (20 mg total) by mouth daily.   LORazepam 2 MG/ML injection Commonly known as:  ATIVAN Inject 0.5-1 mLs (1-2 mg total) into the vein every 2 (two) hours as needed for anxiety or seizure.   LORazepam 2 MG/ML injection Commonly known as:  ATIVAN Inject 1 mL (2 mg total) into the vein every 6 (six) hours.   magnesium oxide 400 (241.3 Mg) MG tablet Commonly known as:  MAG-OX Take 1 tablet (400 mg total) by mouth 2 (  two) times daily.   morphine 2 MG/ML injection Inject 1 mL (2 mg total) into the vein every hour as needed (SOB).   polyvinyl alcohol 1.4 % ophthalmic solution Commonly known as:  LIQUIFILM TEARS Place 1 drop into both eyes 4 (four) times daily as needed for dry eyes.   Potassium Chloride ER 20 MEQ Tbcr Take 20 mEq by mouth 2 (two) times daily. What changed:  when to take this   simvastatin 40 MG tablet Commonly known as:  ZOCOR Take 1 tablet (40 mg total) by mouth at bedtime.      Follow-up Information    [facility]   Contact information: [phone]         No Known Allergies  Consultations:  Palliative care   Neurology    Procedures/Studies: Dg Abd 1 View  Result Date: 08/07/2017 CLINICAL DATA:  Follow-up ileus EXAM: ABDOMEN - 1 VIEW COMPARISON:  07/30/2017 FINDINGS: Scattered large and small bowel gas is noted. Contrast material from a barium swallow is now noted throughout the colon. No obstructive changes are seen. The nasogastric catheter is not well  appreciated. No free air is seen. No bony abnormality is noted. IMPRESSION: Previously administered contrast now lies within the colon. No obstructive changes are seen. Electronically Signed   By: Inez Catalina M.D.   On: 08/07/2017 09:00   Dg Abd 1 View  Result Date: 07/30/2017 CLINICAL DATA:  62 year old male status post nasogastric tube placement. EXAM: ABDOMEN - 1 VIEW COMPARISON:  Prior x-ray 07/30/2017 FINDINGS: The nasogastric tube is been advanced. The tip of the tube now overlies the gastric fundus. The proximal side hole is in the region of the gastroesophageal junction. Stable left lower lobe opacity. Unremarkable bowel gas pattern. IMPRESSION: 1. The tip of the nasogastric tube now overlies the gastric fundus. Electronically Signed   By: Jacqulynn Cadet M.D.   On: 07/30/2017 14:15   Dg Abd 1 View  Result Date: 07/30/2017 CLINICAL DATA:  Nasogastric tube placement EXAM: ABDOMEN - 1 VIEW COMPARISON:  07/30/2017 at 9:03 a.m. FINDINGS: The patient is rotated to the left on today's radiograph, reducing diagnostic sensitivity and specificity. Nasogastric tube tip projects over the expected location of the stomach with the side port in the distal esophagus. Left retrocardiac airspace opacity IMPRESSION: 1. NGT tip projects over the expected location of the stomach with side port in the distal esophagus, this is not appreciably changed from the earlier exam. 2. Suspected left retrocardiac airspace opacity. Electronically Signed   By: Van Clines M.D.   On: 07/30/2017 12:36   Ct Head Wo Contrast  Result Date: 08/08/2017 CLINICAL DATA:  Facial weakness.  Alcohol withdrawal. EXAM: CT HEAD WITHOUT CONTRAST TECHNIQUE: Contiguous axial images were obtained from the base of the skull through the vertex without intravenous contrast. COMPARISON:  None. FINDINGS: Brain: Image quality degraded by motion. Moderate atrophy. Negative for hydrocephalus. Patchy hypodensity in the cerebral white matter  bilaterally, consistent with chronic microvascular ischemia. Hypodensity in the right posterior basal ganglia, suspicious for acute infarct. Confirmed with MRI. Hypodensity left cerebellum compatible with artifact based on coronal and sagittal images. Negative for hemorrhage or mass. Vascular: Negative for hyperdense vessel Skull: Negative Sinuses/Orbits: Mild mucosal edema left maxillary sinus. Negative orbit Other: None IMPRESSION: Hypodensity right posterior basal ganglia, suspicious for acute infarct. Recommend MRI to confirm. Atrophy and chronic microvascular ischemia Image quality degraded by motion. Electronically Signed   By: Franchot Gallo M.D.   On: 08/08/2017 09:58   US Renal  Result Date: 07/20/2017 CLINICAL DATA:  Acute superimposed upon chronic renal insufficiency. EXAM: RENAL / URINARY TRACT ULTRASOUND COMPLETE COMPARISON:  06/16/2016. FINDINGS: Right Kidney: Length: Approximately 14.5 cm. No hydronephrosis. Well-preserved cortex. No shadowing calculi. Normal parenchymal echotexture. No focal parenchymal abnormality. Left Kidney: Length: Approximately 11.4 cm. No hydronephrosis. Well-preserved cortex. No shadowing calculi. Normal parenchymal echotexture. No focal parenchymal abnormality. Bladder: Decompressed by Foley catheter. Other: Small amount of ascites. IMPRESSION: 1. Normal appearing kidneys by ultrasound with no evidence of obstruction. 2. Small amount of ascites. Electronically Signed   By: Evangeline Dakin M.D.   On: 07/20/2017 19:36   Dg Chest Port 1 View  Result Date: 08/07/2017 CLINICAL DATA:  Fever.  Recent extubation EXAM: PORTABLE CHEST 1 VIEW August 03, 2017 FINDINGS: Endotracheal tube and nasogastric tube have been removed. No pneumothorax. There is consolidation in the left lower lobe with small left pleural effusion. There is mild right base atelectasis. Heart is upper normal in size with pulmonary vascularity within normal limits. No adenopathy. No evident bone  lesions. IMPRESSION: Left lower lobe consolidation with small left pleural effusion. Mild right base atelectasis. Stable cardiac silhouette. Electronically Signed   By: Lowella Grip III M.D.   On: 08/07/2017 09:01   Dg Chest Port 1 View  Result Date: 08/03/2017 CLINICAL DATA:  62 year old male admitted with shortness of breath and alcohol withdrawal. Respiratory failure. Intubated. EXAM: PORTABLE CHEST 1 VIEW COMPARISON:  08/01/2017 and earlier. FINDINGS: Portable AP semi upright view at 0356 hours. Endotracheal tube tip just below the clavicles. Enteric tube courses to the left abdomen, tip not included. Left IJ central line has been removed. The patient is rotated to the left similar to prior studies. Stable lung volumes. Stable cardiac size and mediastinal contours. No pneumothorax or pulmonary edema. Dense retrocardiac opacity on the left. Mild superimposed left lung base veiling opacity. No confluent right lung opacity. IMPRESSION: 1. Left IJ dual lumen catheter removed. Otherwise stable lines and tubes. 2. Continued left lower lobe collapse or consolidation. Consider mucous plug. Probable small left pleural effusion. 3. Right lung essentially clear. Electronically Signed   By: Genevie Ann M.D.   On: 08/03/2017 07:27   Dg Chest Port 1 View  Result Date: 08/01/2017 CLINICAL DATA:  Respiratory failure EXAM: PORTABLE CHEST 1 VIEW COMPARISON:  07/31/2017 FINDINGS: Support devices are stable. Mild cardiomegaly. Left lower lobe airspace opacity. Minimal right base atelectasis. IMPRESSION: Continued left retrocardiac atelectasis or pneumonia. Minimal right base atelectasis. Electronically Signed   By: Rolm Baptise M.D.   On: 08/01/2017 07:08   Dg Chest Port 1 View  Result Date: 07/31/2017 CLINICAL DATA:  Acute respiratory failure.  Ventilator support. EXAM: PORTABLE CHEST 1 VIEW COMPARISON:  07/30/2017 FINDINGS: Patient rotated towards the left. Endotracheal tube tip 4 cm above the carina. Nasogastric  tube enters the abdomen. Central line unchanged in the SVC. Right lung remains clear. Persistent left lower lobe atelectasis. IMPRESSION: Persistent left lower lobe atelectasis. Electronically Signed   By: Nelson Chimes M.D.   On: 07/31/2017 06:40   Dg Chest Port 1 View  Result Date: 07/30/2017 CLINICAL DATA:  Endotracheal tube advancement. EXAM: PORTABLE CHEST 1 VIEW COMPARISON:  Chest radiograph performed earlier today at 12:23 a.m. FINDINGS: The patient's endotracheal tube is seen ending 6-7 cm above the carina. Retrocardiac airspace opacification is again noted. A small left pleural effusion is suspected. No pneumothorax is seen. The cardiomediastinal silhouette is normal in size. No acute osseous abnormalities are identified. A left-sided dual-lumen catheter is  noted ending about the mid SVC. IMPRESSION: 1. Endotracheal tube seen ending 6-7 cm above the carina. 2. Retrocardiac airspace opacification again noted. Small left pleural effusion suspected. Electronically Signed   By: Garald Balding M.D.   On: 07/30/2017 05:21   Dg Chest Port 1 View  Result Date: 07/30/2017 CLINICAL DATA:  Endotracheal tube placement. EXAM: PORTABLE CHEST 1 VIEW COMPARISON:  Chest radiograph performed 07/29/2017 FINDINGS: The patient's endotracheal tube is seen ending 9-10 cm above the carina. This could be advanced 5-6 cm. Retrocardiac airspace opacification raises concern for pneumonia. A small left pleural effusion is suspected. No pneumothorax is seen. The cardiomediastinal silhouette is normal in size. No acute osseous abnormalities are identified. IMPRESSION: 1. Endotracheal tube seen ending 9-10 cm above the carina. This could be advanced 5-6 cm, as deemed clinically appropriate. 2. Persistent retrocardiac airspace opacification raises concern for pneumonia. Small left pleural effusion suspected. Electronically Signed   By: Garald Balding M.D.   On: 07/30/2017 01:05   Dg Chest Port 1 View  Result Date:  07/29/2017 CLINICAL DATA:  Acute onset of respiratory distress. EXAM: PORTABLE CHEST 1 VIEW COMPARISON:  Chest radiograph performed 07/26/2017 FINDINGS: There is persistent retrocardiac airspace opacification, with interval resolution of right-sided airspace opacity. This is concerning for underlying pneumonia. A small left pleural effusion is suspected, though the left costophrenic angle is incompletely imaged. No pneumothorax is seen. The cardiomediastinal silhouette is normal in size. No acute osseous abnormalities are seen. A left IJ dual-lumen catheter is noted ending about the proximal SVC. IMPRESSION: Persistent retrocardiac airspace opacification, with interval resolution of right-sided airspace opacity. This raises concern for pneumonia. Suspect small left pleural effusion. Electronically Signed   By: Garald Balding M.D.   On: 07/29/2017 23:59   Dg Chest Port 1 View  Result Date: 07/26/2017 CLINICAL DATA:  Acute respiratory failure EXAM: PORTABLE CHEST 1 VIEW COMPARISON:  07/25/2017 FINDINGS: Endotracheal tube removed. Central venous catheter tip in the SVC unchanged. Gastric tube removed. Left chest tube removed. Bilateral airspace disease, probable edema, is similar. Bibasilar atelectasis and effusion similar. IMPRESSION: Endotracheal tube removed. Left chest tube removed. No pneumothorax. Bilateral airspace disease unchanged, probable edema. Bibasilar atelectasis and effusion unchanged. Electronically Signed   By: Franchot Gallo M.D.   On: 07/26/2017 06:36   Dg Chest Port 1 View  Result Date: 07/25/2017 CLINICAL DATA:  Acute respiratory failure EXAM: PORTABLE CHEST 1 VIEW COMPARISON:  07/24/2017 FINDINGS: Endotracheal tube in good position. Central line tip in the SVC. NG tube in the stomach. Left chest tube in place and unchanged. No pneumothorax Improvement in bilateral airspace disease. Mild improvement in bilateral effusions and bibasilar atelectasis. IMPRESSION: Improvement in pulmonary  edema and bilateral effusions. Moderate bilateral effusions and bibasilar atelectasis have improved. Support lines remain in good position. Electronically Signed   By: Franchot Gallo M.D.   On: 07/25/2017 09:20   Dg Chest Port 1 View  Result Date: 07/24/2017 CLINICAL DATA:  Respiratory failure EXAM: PORTABLE CHEST 1 VIEW COMPARISON:  Yesterday FINDINGS: Endotracheal tube tip at the clavicular heads. IJ catheter from the left with tip at the upper SVC. There is a nasogastric tube which projects over the heart, also seen on image yesterday. This is likely due to obliquity and possibly left diaphragm elevation, intragastric position was convincing on chest x-ray yesterday. No previous imaging to evaluate for hiatal hernia. There is haziness of the bilateral chest consistent with pleural fluid and atelectasis. Consolidation could be obscured. Kerley lines have likely improved. No  pneumothorax. Cardiomegaly. IMPRESSION: 1. Stable positioning of tubes and central line. 2. Layering effusions and presumed atelectasis. Kerley lines/interstitial edema may have improved since yesterday. Electronically Signed   By: Monte Fantasia M.D.   On: 07/24/2017 06:43   Dg Chest Port 1 View  Result Date: 07/23/2017 CLINICAL DATA:  62 year old male. Alcohol withdrawal. Respiratory failure. Intubated. EXAM: PORTABLE CHEST 1 VIEW COMPARISON:  07/22/2017 and earlier. FINDINGS: Portable AP semi upright views at 0425 hours. Endotracheal tube tip in good position between the level the clavicles and carina. Stable dual lumen left IJ central line. Enteric tube courses to the left upper quadrant and loops over the lower cardiac contour but appears to be in the stomach on the second image. Veiling bilateral pulmonary opacity continues obscuring the diaphragm and lower lungs. Stable cardiac size and mediastinal contours. No pneumothorax. Stable pulmonary vascularity without overt edema. IMPRESSION: 1.  Stable lines and tubes. 2. Stable  ventilation since yesterday. Moderate to large bilateral pleural effusions suspected with lower lobe collapse or consolidation. Electronically Signed   By: Genevie Ann M.D.   On: 07/23/2017 06:54   Dg Chest Port 1 View  Result Date: 07/22/2017 CLINICAL DATA:  Follow-up pneumonia EXAM: PORTABLE CHEST 1 VIEW COMPARISON:  07/21/2017 FINDINGS: Endotracheal tube, nasogastric catheter and left jugular central line are again seen and stable. Cardiac shadow is mildly enlarged but stable. Bilateral pleural effusions are again seen. Bibasilar opacities are noted as well stable from the prior exam. No new focal abnormality is seen. IMPRESSION: No change from the prior exam. Electronically Signed   By: Inez Catalina M.D.   On: 07/22/2017 06:56   Dg Chest Port 1 View  Result Date: 07/21/2017 CLINICAL DATA:  Central line placement. EXAM: PORTABLE CHEST 1 VIEW COMPARISON:  07/21/2017 at 0433 hours FINDINGS: Endotracheal tube is unchanged. Enteric tube is partially looped in the stomach with tip in the region of the gastric fundus. A new left jugular catheter terminates over the mid SVC. The cardiac silhouette remains enlarged. Veiling opacities in the lung bases are unchanged and may reflect a combination of pleural fluid and airspace opacity. No pneumothorax is identified. IMPRESSION: 1. Left jugular catheter terminates over the mid SVC. 2. Unchanged cardiomegaly and bibasilar lung opacities which may reflect pleural effusions and atelectasis or airspace disease. Electronically Signed   By: Logan Bores M.D.   On: 07/21/2017 15:42   Dg Chest Port 1 View  Result Date: 07/21/2017 CLINICAL DATA:  Respiratory failure. EXAM: PORTABLE CHEST 1 VIEW COMPARISON:  07/20/2017. FINDINGS: Endotracheal tube and NG tube in stable position. Stable cardiomegaly. Stable prominence of the azygos vein. Bilateral pleural effusions again noted. Underlying bibasilar infiltrates/edema most likely present. No pneumothorax. IMPRESSION: 1. Lines  and tubes stable position. 2. Persistent cardiomegaly. Bilateral pleural effusions again noted. Underlying bibasilar infiltrates/edema most likely present. No change from prior exam. Electronically Signed   By: Marcello Moores  Register   On: 07/21/2017 06:38   Dg Chest Port 1 View  Result Date: 07/20/2017 CLINICAL DATA:  62 year old male with a past medical history significant for alcohol abuse who presented to the [facility] emergency department 12/10 in the setting of alcohol withdrawal. He stated that he drank a significant amount of vodka p.*comment was truncated* EXAM: PORTABLE CHEST 1 VIEW COMPARISON:  07/18/2017 FINDINGS: Endotracheal tube tip 3.8 cm above the carina. Nasogastric tube enters the stomach. The patient is rotated to the bright on today's radiograph, reducing diagnostic sensitivity and specificity. The patient is tilted far to the  left during imaging and positioning is semi erect. Obscuration of both hemidiaphragms continues. Mild enlargement of the cardiopericardial silhouette. IMPRESSION: 1. Continued obscuration of both hemidiaphragms could be from bibasilar airspace opacities, atelectasis, or pleural effusions. 2. Mild enlargement of the cardiopericardial silhouette. 3. Endotracheal tube is satisfactorily position. Nasogastric tube enters the stomach. Electronically Signed   By: Van Clines M.D.   On: 07/20/2017 10:00   Dg Chest Port 1 View  Result Date: 07/18/2017 CLINICAL DATA:  Endotracheal tube repositioning. EXAM: PORTABLE CHEST 1 VIEW COMPARISON:  Chest radiograph performed 07/17/2017 FINDINGS: The patient's endotracheal tube is seen ending 8 cm above the carina; this could be advanced 4 cm, as deemed clinically appropriate. The patient's enteric tube is noted extending below the diaphragm. Bibasilar airspace opacities may reflect atelectasis or pneumonia. Small bilateral pleural effusions are suspected. No pneumothorax is seen. The cardiomediastinal silhouette is normal in  size. No acute osseous abnormalities are identified. IMPRESSION: 1. Endotracheal tube seen ending 8 cm above the carina. This be advanced 4 cm, as deemed clinically appropriate. 2. Enteric tube noted extending below the diaphragm. 3. Bibasilar airspace opacities may reflect atelectasis or pneumonia. Small bilateral pleural effusions noted. Electronically Signed   By: Garald Balding M.D.   On: 07/18/2017 04:45   Portable Chest Xray  Result Date: 07/17/2017 CLINICAL DATA:  Acute respiratory failure with hypoxemia. Endotracheal tube placement. EXAM: PORTABLE CHEST 1 VIEW COMPARISON:  07/17/2017 FINDINGS: Endotracheal tube has been placed. The tube is at the level of the clavicle heads and approximately 8.8 cm above the carina. Nasogastric tube extends into the abdomen. Again noted are interstitial/airspace densities in the right lower lung. Upper lung lucency is suggestive for hyperinflation and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

## 2017-08-12 LAB — CULTURE, BLOOD (ROUTINE X 2)
Culture: NO GROWTH
SPECIAL REQUESTS: ADEQUATE
SPECIAL REQUESTS: ADEQUATE

## 2017-09-08 DEATH — deceased

## 2018-08-06 IMAGING — DX DG ABDOMEN 1V
1 series · 1 of 1 positions shown · non-contrast
Comparison: 07/30/2017 at [DATE] a.m.

CLINICAL DATA: Nasogastric tube placement

EXAM:
ABDOMEN - 1 VIEW

[abdomen kub]
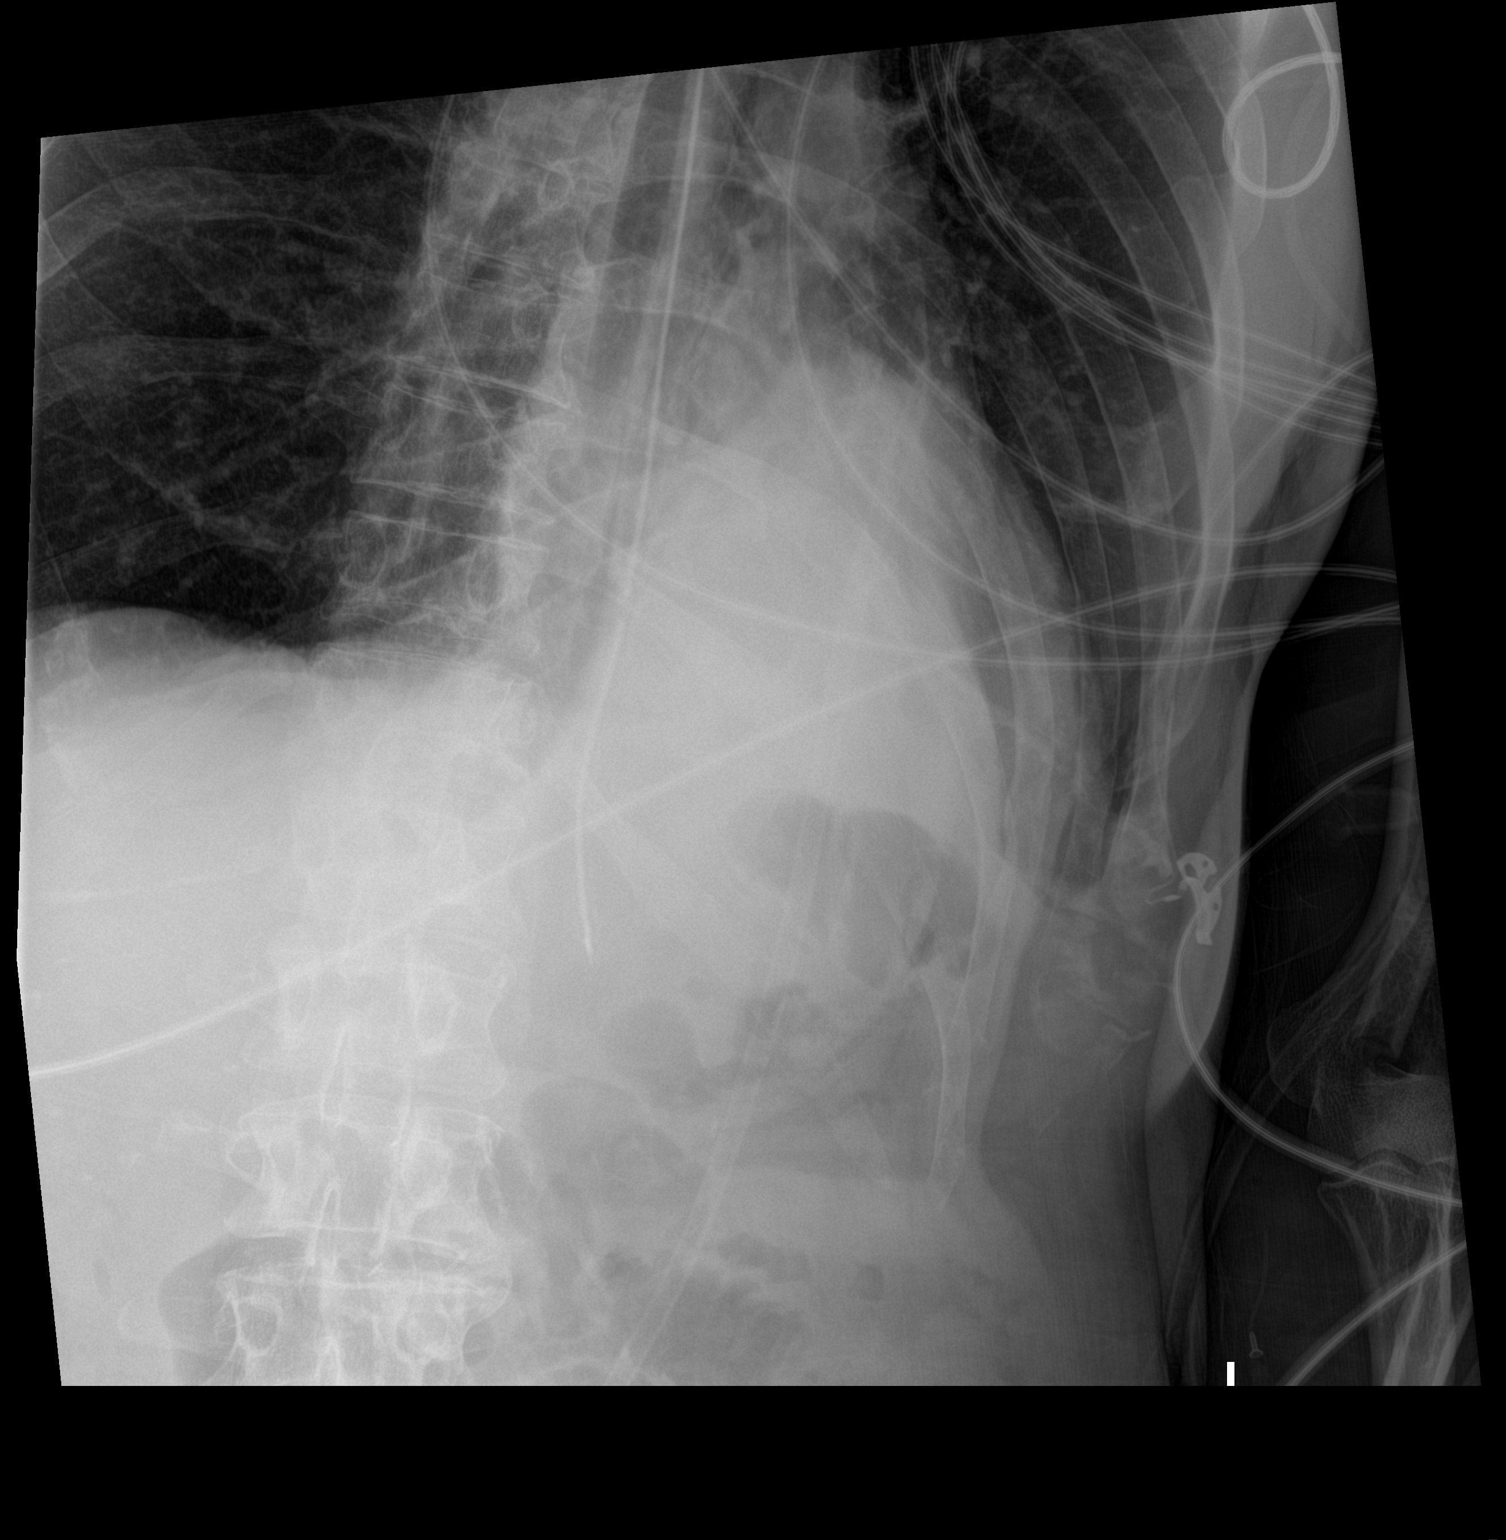

[1 of 1 positions shown; findings below may reference images not displayed]

FINDINGS: The patient is rotated to the left on today's radiograph, reducing
diagnostic sensitivity and specificity. Nasogastric tube tip
projects over the expected location of the stomach with the side
port in the distal esophagus.

Left retrocardiac airspace opacity
IMPRESSION: 1. NGT tip projects over the expected location of the stomach with
side port in the distal esophagus, this is not appreciably changed
from the earlier exam.
2. Suspected left retrocardiac airspace opacity.

## 2018-08-08 IMAGING — DX DG CHEST 1V PORT
1 series · 1 of 1 positions shown · non-contrast
Comparison: 07/31/2017

CLINICAL DATA: Respiratory failure

EXAM:
PORTABLE CHEST 1 VIEW

[chest ap]
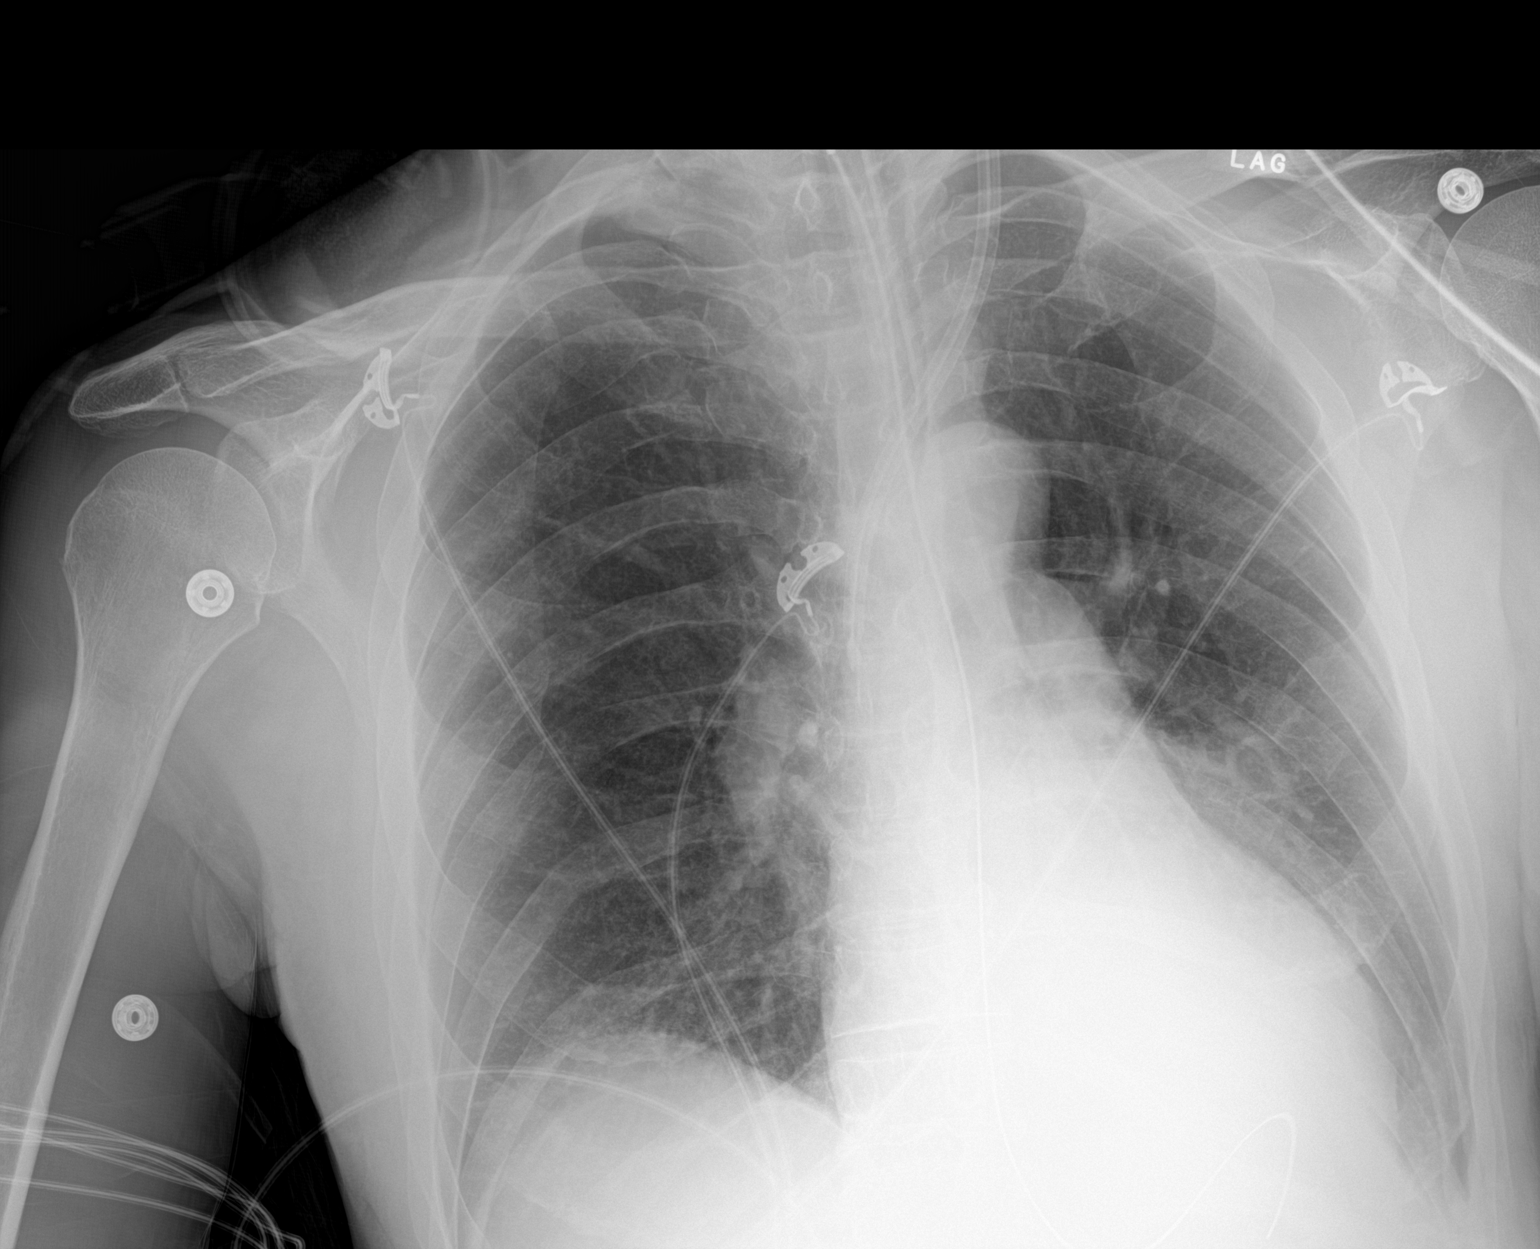

[1 of 1 positions shown; findings below may reference images not displayed]

FINDINGS: Support devices are stable. Mild cardiomegaly. Left lower lobe
airspace opacity. Minimal right base atelectasis.
IMPRESSION: Continued left retrocardiac atelectasis or pneumonia. Minimal right
base atelectasis.

## 2018-08-10 IMAGING — DX DG CHEST 1V PORT
1 series · 1 of 1 positions shown · non-contrast
Comparison: 08/01/2017 and earlier.

CLINICAL DATA: 61-year-old male admitted with shortness of breath
and alcohol withdrawal. Respiratory failure. Intubated.

EXAM:
PORTABLE CHEST 1 VIEW

[chest ap]
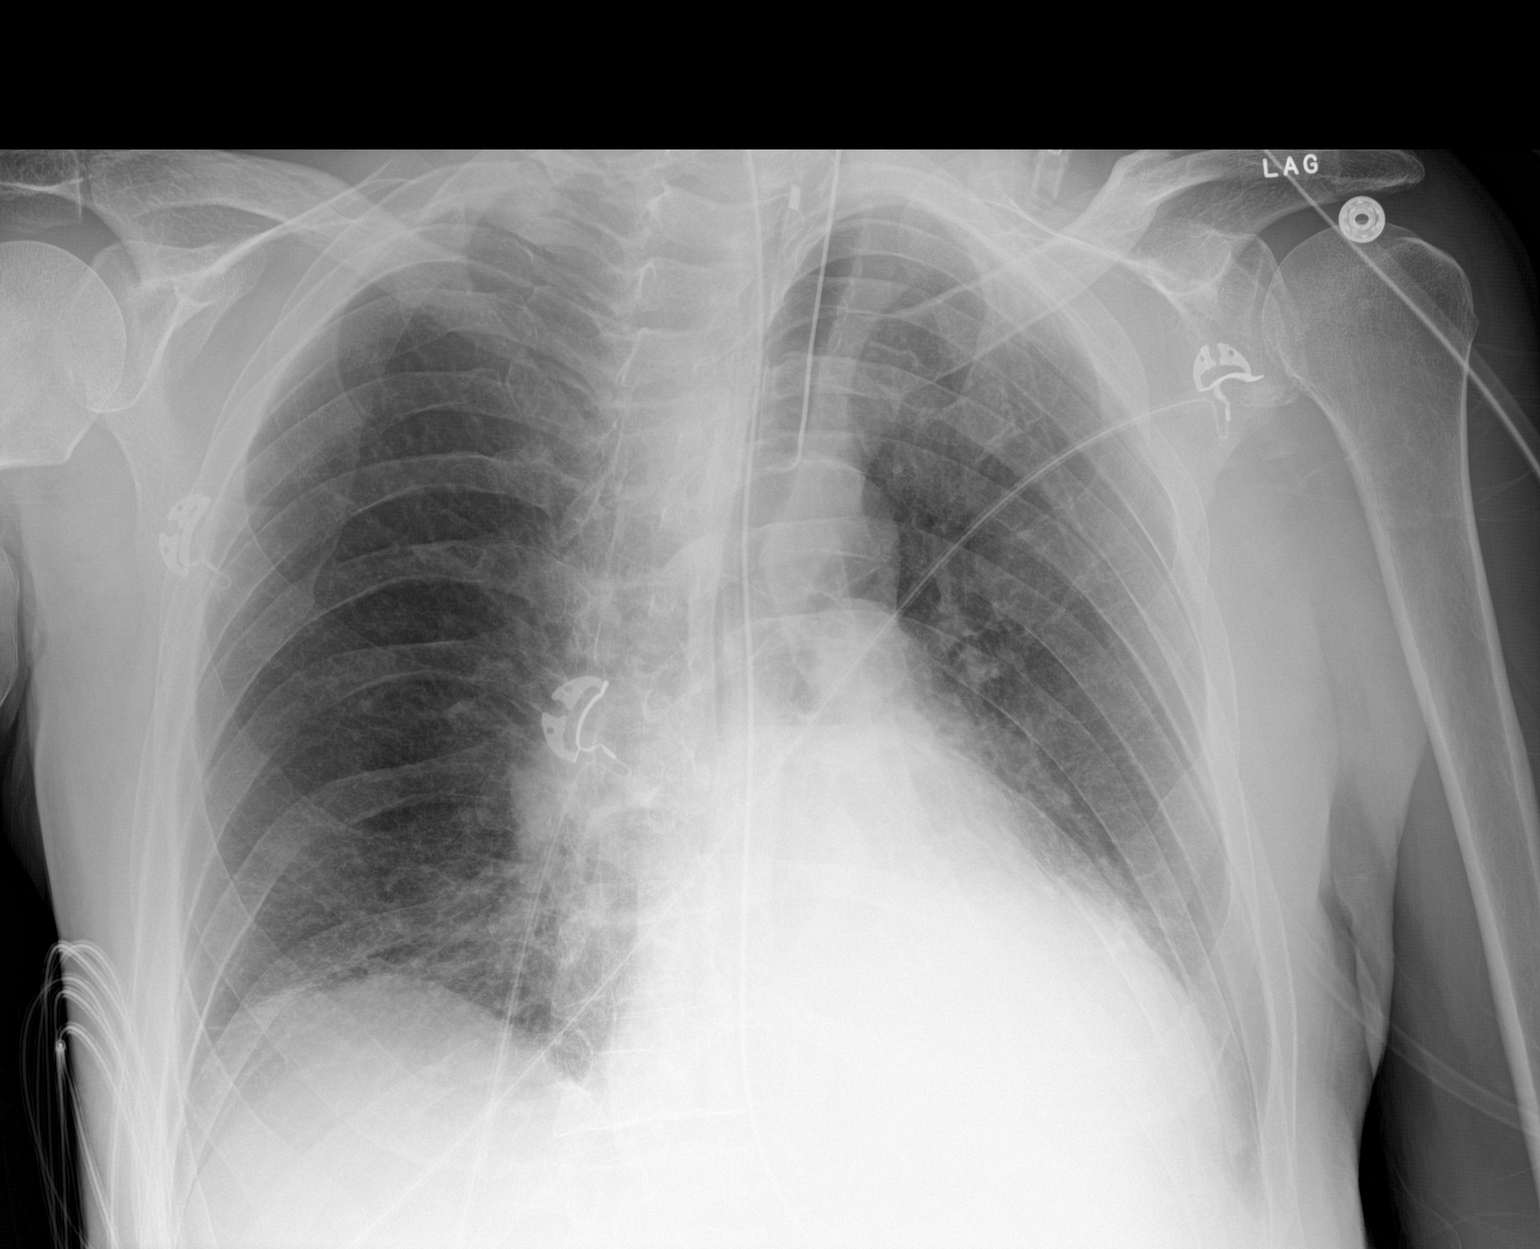

[1 of 1 positions shown; findings below may reference images not displayed]

FINDINGS: Portable AP semi upright view at [id number] hours. Endotracheal tube tip
just below the clavicles. Enteric tube courses to the left abdomen,
tip not included. Left IJ central line has been removed.

The patient is rotated to the left similar to prior studies. Stable
lung volumes. Stable cardiac size and mediastinal contours. No
pneumothorax or pulmonary edema. Dense retrocardiac opacity on the
left. Mild superimposed left lung base veiling opacity. No confluent
right lung opacity.
IMPRESSION: 1. Left IJ dual lumen catheter removed. Otherwise stable lines and
tubes.
2. Continued left lower lobe collapse or consolidation. Consider
mucous plug. Probable small left pleural effusion.
3. Right lung essentially clear.

## 2018-08-14 IMAGING — DX DG CHEST 1V PORT
1 series · 1 of 1 positions shown · non-contrast
Comparison: none

CLINICAL DATA: Fever.  Recent extubation

EXAM:
PORTABLE CHEST 1 VIEW
August 03, 2017

[chest ap]
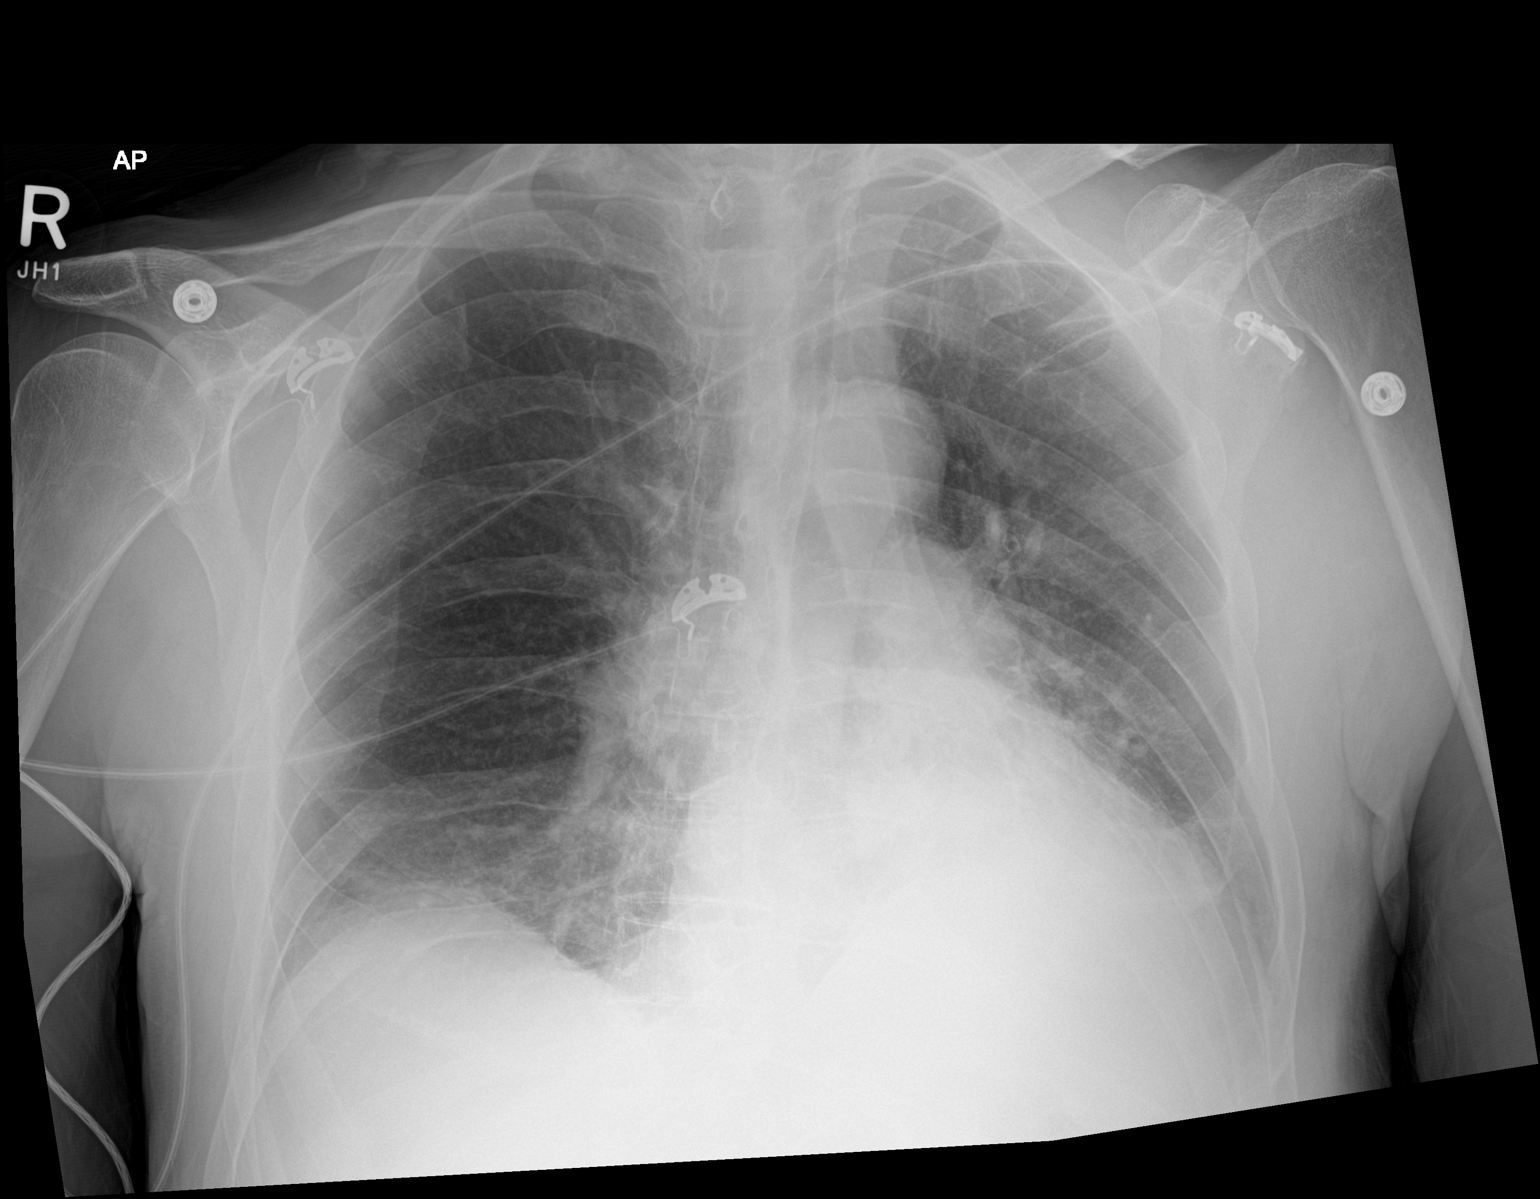

[1 of 1 positions shown; findings below may reference images not displayed]

FINDINGS: Endotracheal tube and nasogastric tube have been removed. No
pneumothorax. There is consolidation in the left lower lobe with
small left pleural effusion. There is mild right base atelectasis.
Heart is upper normal in size with pulmonary vascularity within
normal limits. No adenopathy. No evident bone lesions.
IMPRESSION: Left lower lobe consolidation with small left pleural effusion. Mild
right base atelectasis. Stable cardiac silhouette.

## 2018-08-14 IMAGING — DX DG ABDOMEN 1V
1 series · 1 of 1 positions shown · non-contrast
Comparison: 07/30/2017

CLINICAL DATA: Follow-up ileus

EXAM:
ABDOMEN - 1 VIEW

[abdomen kub]
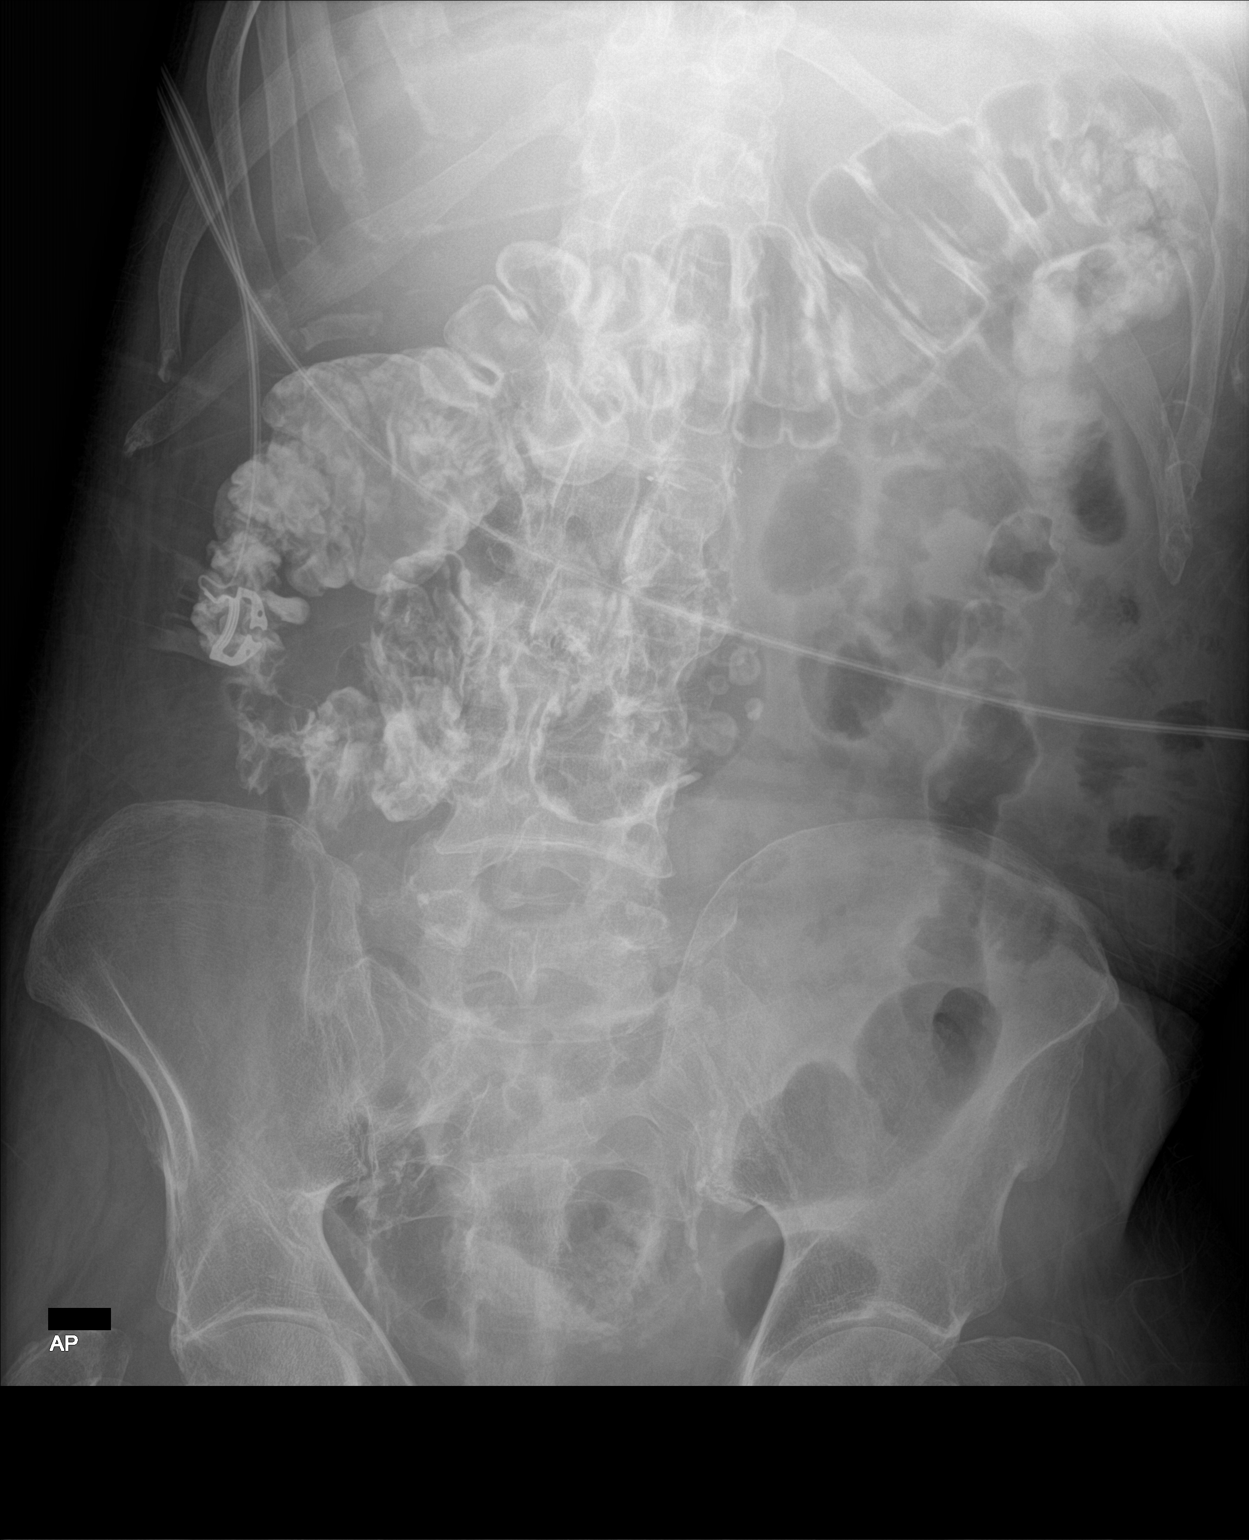

[1 of 1 positions shown; findings below may reference images not displayed]

FINDINGS: Scattered large and small bowel gas is noted. Contrast material from
a barium swallow is now noted throughout the colon. No obstructive
changes are seen. The nasogastric catheter is not well appreciated.
No free air is seen. No bony abnormality is noted.
IMPRESSION: Previously administered contrast now lies within the colon. No
obstructive changes are seen.
